# Patient Record
Sex: Female | Born: 1935
Health system: Southern US, Community
[De-identification: ages and names within clinical notes are randomized; demographics above are authoritative.]

## PROBLEM LIST (undated history)

## (undated) DIAGNOSIS — K5909 Other constipation: Secondary | ICD-10-CM

## (undated) DIAGNOSIS — J449 Chronic obstructive pulmonary disease, unspecified: Secondary | ICD-10-CM

## (undated) DIAGNOSIS — K224 Dyskinesia of esophagus: Secondary | ICD-10-CM

## (undated) DIAGNOSIS — K56609 Unspecified intestinal obstruction, unspecified as to partial versus complete obstruction: Secondary | ICD-10-CM

## (undated) DIAGNOSIS — H409 Unspecified glaucoma: Secondary | ICD-10-CM

## (undated) DIAGNOSIS — K9189 Other postprocedural complications and disorders of digestive system: Secondary | ICD-10-CM

## (undated) DIAGNOSIS — I10 Essential (primary) hypertension: Secondary | ICD-10-CM

## (undated) DIAGNOSIS — N83201 Unspecified ovarian cyst, right side: Secondary | ICD-10-CM

## (undated) DIAGNOSIS — N183 Chronic kidney disease, stage 3 (moderate): Secondary | ICD-10-CM

## (undated) DIAGNOSIS — N6009 Solitary cyst of unspecified breast: Secondary | ICD-10-CM

## (undated) DIAGNOSIS — N390 Urinary tract infection, site not specified: Secondary | ICD-10-CM

## (undated) DIAGNOSIS — IMO0002 Reserved for concepts with insufficient information to code with codable children: Secondary | ICD-10-CM

## (undated) DIAGNOSIS — K567 Ileus, unspecified: Secondary | ICD-10-CM

## (undated) DIAGNOSIS — E43 Unspecified severe protein-calorie malnutrition: Secondary | ICD-10-CM

## (undated) DIAGNOSIS — R9430 Abnormal result of cardiovascular function study, unspecified: Secondary | ICD-10-CM

## (undated) DIAGNOSIS — E46 Unspecified protein-calorie malnutrition: Secondary | ICD-10-CM

## (undated) DIAGNOSIS — N261 Atrophy of kidney (terminal): Secondary | ICD-10-CM

## (undated) HISTORY — DX: Unspecified intestinal obstruction, unspecified as to partial versus complete obstruction: K56.609

## (undated) HISTORY — DX: Abnormal result of cardiovascular function study, unspecified: R94.30

## (undated) HISTORY — DX: Reserved for concepts with insufficient information to code with codable children: IMO0002

---

## 1998-09-25 ENCOUNTER — Emergency Department (HOSPITAL_COMMUNITY): Admission: EM | Admit: 1998-09-25 | Discharge: 1998-09-25 | Payer: Self-pay | Admitting: Internal Medicine

## 1999-10-23 ENCOUNTER — Encounter: Payer: Self-pay | Admitting: Emergency Medicine

## 1999-10-23 ENCOUNTER — Emergency Department (HOSPITAL_COMMUNITY): Admission: EM | Admit: 1999-10-23 | Discharge: 1999-10-23 | Payer: Self-pay | Admitting: Emergency Medicine

## 1999-12-19 ENCOUNTER — Inpatient Hospital Stay (HOSPITAL_COMMUNITY): Admission: EM | Admit: 1999-12-19 | Discharge: 1999-12-23 | Payer: Self-pay | Admitting: Emergency Medicine

## 1999-12-21 ENCOUNTER — Encounter: Payer: Self-pay | Admitting: Cardiology

## 2000-02-06 ENCOUNTER — Emergency Department (HOSPITAL_COMMUNITY): Admission: EM | Admit: 2000-02-06 | Discharge: 2000-02-06 | Payer: Self-pay | Admitting: Emergency Medicine

## 2000-02-28 ENCOUNTER — Emergency Department (HOSPITAL_COMMUNITY): Admission: EM | Admit: 2000-02-28 | Discharge: 2000-02-29 | Payer: Self-pay | Admitting: Emergency Medicine

## 2000-02-29 ENCOUNTER — Encounter: Payer: Self-pay | Admitting: Emergency Medicine

## 2000-04-13 ENCOUNTER — Emergency Department (HOSPITAL_COMMUNITY): Admission: EM | Admit: 2000-04-13 | Discharge: 2000-04-14 | Payer: Self-pay

## 2000-05-06 ENCOUNTER — Encounter: Payer: Self-pay | Admitting: Family Medicine

## 2000-05-06 ENCOUNTER — Encounter: Admission: RE | Admit: 2000-05-06 | Discharge: 2000-05-06 | Payer: Self-pay | Admitting: Family Medicine

## 2002-01-08 ENCOUNTER — Inpatient Hospital Stay (HOSPITAL_COMMUNITY): Admission: EM | Admit: 2002-01-08 | Discharge: 2002-01-08 | Payer: Self-pay | Admitting: *Deleted

## 2002-02-05 ENCOUNTER — Emergency Department (HOSPITAL_COMMUNITY): Admission: EM | Admit: 2002-02-05 | Discharge: 2002-02-05 | Payer: Self-pay | Admitting: Emergency Medicine

## 2002-02-13 ENCOUNTER — Encounter: Payer: Self-pay | Admitting: *Deleted

## 2002-02-13 ENCOUNTER — Emergency Department (HOSPITAL_COMMUNITY): Admission: EM | Admit: 2002-02-13 | Discharge: 2002-02-13 | Payer: Self-pay | Admitting: *Deleted

## 2002-08-20 ENCOUNTER — Emergency Department (HOSPITAL_COMMUNITY): Admission: EM | Admit: 2002-08-20 | Discharge: 2002-08-21 | Payer: Self-pay | Admitting: Emergency Medicine

## 2002-08-20 ENCOUNTER — Encounter: Payer: Self-pay | Admitting: Emergency Medicine

## 2002-08-29 ENCOUNTER — Encounter: Payer: Self-pay | Admitting: Emergency Medicine

## 2002-08-29 ENCOUNTER — Emergency Department (HOSPITAL_COMMUNITY): Admission: EM | Admit: 2002-08-29 | Discharge: 2002-08-29 | Payer: Self-pay | Admitting: Emergency Medicine

## 2002-12-19 ENCOUNTER — Emergency Department (HOSPITAL_COMMUNITY): Admission: EM | Admit: 2002-12-19 | Discharge: 2002-12-20 | Payer: Self-pay | Admitting: Emergency Medicine

## 2003-12-29 ENCOUNTER — Emergency Department (HOSPITAL_COMMUNITY): Admission: EM | Admit: 2003-12-29 | Discharge: 2003-12-29 | Payer: Self-pay

## 2005-01-27 ENCOUNTER — Encounter: Payer: Self-pay | Admitting: Cardiology

## 2005-01-27 ENCOUNTER — Inpatient Hospital Stay (HOSPITAL_COMMUNITY): Admission: EM | Admit: 2005-01-27 | Discharge: 2005-01-28 | Payer: Self-pay | Admitting: Emergency Medicine

## 2006-01-29 ENCOUNTER — Emergency Department (HOSPITAL_COMMUNITY): Admission: EM | Admit: 2006-01-29 | Discharge: 2006-01-30 | Payer: Self-pay | Admitting: Emergency Medicine

## 2006-02-03 ENCOUNTER — Emergency Department (HOSPITAL_COMMUNITY): Admission: EM | Admit: 2006-02-03 | Discharge: 2006-02-03 | Payer: Self-pay | Admitting: Emergency Medicine

## 2007-06-23 DIAGNOSIS — N83201 Unspecified ovarian cyst, right side: Secondary | ICD-10-CM

## 2007-06-23 HISTORY — PX: BILATERAL OOPHORECTOMY: SHX1221

## 2007-06-23 HISTORY — DX: Unspecified ovarian cyst, right side: N83.201

## 2007-09-17 ENCOUNTER — Emergency Department (HOSPITAL_COMMUNITY): Admission: EM | Admit: 2007-09-17 | Discharge: 2007-09-17 | Payer: Self-pay | Admitting: Emergency Medicine

## 2007-11-05 ENCOUNTER — Inpatient Hospital Stay (HOSPITAL_COMMUNITY): Admission: EM | Admit: 2007-11-05 | Discharge: 2007-11-08 | Payer: Self-pay | Admitting: Emergency Medicine

## 2007-11-08 ENCOUNTER — Encounter (INDEPENDENT_AMBULATORY_CARE_PROVIDER_SITE_OTHER): Payer: Self-pay | Admitting: Internal Medicine

## 2007-11-10 ENCOUNTER — Encounter: Admission: RE | Admit: 2007-11-10 | Discharge: 2007-11-10 | Payer: Self-pay | Admitting: Internal Medicine

## 2007-12-06 ENCOUNTER — Ambulatory Visit: Admission: RE | Admit: 2007-12-06 | Discharge: 2007-12-06 | Payer: Self-pay | Admitting: Gynecologic Oncology

## 2008-01-17 ENCOUNTER — Encounter (HOSPITAL_COMMUNITY): Payer: Self-pay | Admitting: Obstetrics and Gynecology

## 2008-01-17 ENCOUNTER — Inpatient Hospital Stay (HOSPITAL_COMMUNITY): Admission: RE | Admit: 2008-01-17 | Discharge: 2008-01-20 | Payer: Self-pay | Admitting: Obstetrics and Gynecology

## 2009-11-16 ENCOUNTER — Ambulatory Visit (HOSPITAL_COMMUNITY): Admission: RE | Admit: 2009-11-16 | Discharge: 2009-11-16 | Payer: Self-pay | Admitting: Family Medicine

## 2010-02-18 ENCOUNTER — Emergency Department (HOSPITAL_COMMUNITY): Admission: EM | Admit: 2010-02-18 | Discharge: 2010-02-18 | Payer: Self-pay | Admitting: Emergency Medicine

## 2010-11-04 NOTE — Consult Note (Signed)
NAME:  Amber Rowland, Amber Rowland            ACCOUNT NO.:  [id number]   MEDICAL RECORD NO.:  [id number]          PATIENT TYPE:  OUT   LOCATION:  GYN                          FACILITY:  [facility]   PHYSICIAN:  Paola A. Duard Brady, MD    DATE OF BIRTH:  Aug 09, 1933   DATE OF CONSULTATION:  12/06/2007  DATE OF DISCHARGE:                                 CONSULTATION   HISTORY OF PRESENT ILLNESS:  The patient is seen today in consultation  at the request of Dr. Renaldo Fiddler.  This is a 77 year old gravida 2, para 2  who on May 16 was admitted to [facility] with what  was described as the flu or febrile illness, fever and weakness.  She  underwent a CT scan of the abdomen and pelvis that revealed a low  density lesion in the right adrenal gland which likely represented a  benign adenoma.  The gallbladder was mildly hydropic.  The liver was  negative.  There is no lymphadenopathy, small bowel mesenteric lymph  nodes.  There is a moderate amount of fecal burden throughout the colon  with no evidence of a bowel obstruction.  There was no abdominal or  pelvic free fluid within the pelvis, however.  There was also evidence  of chronic atrophy of the left kidney with a staghorn calculus in the  lower pole measuring 9.3 mm.  Within the pelvis there was a fluid  density structure in the left lower pelvis representing a bladder  diverticulum.  Within the right adnexal region, there was a  multiloculated fluid density measuring 4.8 x 3.1 cm.  There was no  pelvic lymphadenopathy.  There was a moderate to marked amount of  retained stool in the colon and significant degenerative disease.  The  patient's report is somewhat different. She states she presented to the  hospital because the hernia that she had for years she felt was  somewhat getting larger.  She was having some abdominal pain.  She  denies any abdominal pain now though she does complain of some right  lower quadrant aching.   REVIEW OF  SYSTEMS:  She denies any chest pain, short of breath, nausea,  vomiting, fevers, chills, headaches, visual changes.  She can go up a  flight of stairs without any difficulty.  She denies any significant  change in bowel or bladder habits.  She does often have constipation.  She did have some diarrhea after her CT scan.  She denies any blood in  her stool.  She states she has always been undernourished and  malnourished, denies any acute changes and her weight.  She denies early  satiety.   PAST MEDICAL HISTORY:  Hypertension, diabetes.   MEDICATIONS:  Altace questionable dose, Xanax and baby aspirin which was  discontinued at the time of her admission.   PAST SURGICAL HISTORY:  None.   ALLERGIES:  None.   SOCIAL HISTORY:  She smokes half a pack per day since the age of 57.  She denies the use of alcohol.   FAMILY HISTORY:  Noncontributory.   HEALTH MAINTENANCE:  She had a mammogram  2 weeks ago.  She has never had  any colon cancer screening.   GYN HISTORY:  She is as above, she is a gravida 2, para 2, status post  two NSVDs.  She recently had an ultrasound in Dr. Renaldo Fiddler office  revealing the right ovary to be 73 mL in volume with a 3.2 x 2.7 x 2.8  cm solid appearing mass, a 3.6 x 2.5 cm septated cyst and a 2.5 x 2.1-cm  simple appearing cyst with no free fluid. Her CA-125 was normal at 26.2.  Pap smear is negative.   PHYSICAL EXAMINATION:  VITAL SIGNS:  Height 5 feet, 1 inch, weight 96  pounds, blood pressure 176/86, pulse 58.  GENERAL:  A well-nourished, well-developed female in no acute distress.  NECK:  Supple.  There is no lymphadenopathy, no thyromegaly.  LUNGS:  Lungs were clear to auscultation bilaterally.  CARDIOVASCULAR EXAM:  Regular rate and rhythm.  ABDOMEN:  Abdomen is more consistent with a diastasis and in the midline  rather than a specific hernia.  The fascia appears to be intact.  The  diastasis is reducible, however, there appears to be fascia  underneath.  The abdomen is soft and nontender.  There is no fluid wave.  There is no  hepatosplenomegaly.  Groins are negative for adenopathy.  EXTREMITIES:  There is no edema.  PELVIC:  External genitalia is within normal limits though atrophic.  Bimanual examination:  The cervix is palpably normal.  The corpus of  normal size, shape, consistency.  I admit I cannot appreciate a right  adnexal mass.  Rectovaginal examination confirms there is a large amount  of hard stool within the colon.  There is no nodularity.   ASSESSMENT:  A 77 year old with a complex appearing adnexal mass on CT  scan with no evidence of adenopathy or fluid was normal CA-125.  On  ultrasound it appears that there is a solid area which could actually be  potentially the ovary with a simple appearing septated cyst.  I do  recommend surgery and I believe the patient could proceed with  laparoscopic bilateral salpingo-oophorectomy.  If Dr. Renaldo Fiddler wishes to  proceed with the surgery I think that would be reasonable while there is  a chance of malignancy, any time there is a mass in a postmenopausal  woman based on these characteristics I believe that to be a low risk.  Should it be a malignancy it can be done as a staged procedure.  Alternatively if Dr. Renaldo Fiddler wishes for [place] to perform the procedure will  be more than happy to work with her and assist her.  The patient was  seen and these issues were discussed with the patient and her daughter  and son-in-law.  Will arrange follow-up with Dr. Renaldo Fiddler.      Paola A. Duard Brady, MD  Electronically Signed     PAG/MEDQ  D:  12/06/2007  T:  12/06/2007  Job:  [id number] [id number]   cc:   Zelphia Cairo, MD  Fax: [phone]   Telford Nab, R.N.  [id number] [address]  [place], [place] [address]

## 2010-11-04 NOTE — H&P (Signed)
NAME:  Amber Rowland, Amber Rowland            ACCOUNT NO.:  [id number]   MEDICAL RECORD NO.:  [id number]          PATIENT TYPE:  INP   LOCATION:  0104                         FACILITY:  [facility]   PHYSICIAN:  Wilson Singer, M.D.DATE OF BIRTH:  Apr 25, 1934   DATE OF ADMISSION:  11/05/2007  DATE OF DISCHARGE:                              HISTORY & PHYSICAL   HISTORY:  This is a 77 year old lady who came to the emergency room  because, according to her, she was worried about breast lumps that she  found on her right breast.  However, on being reviewed in the emergency  room, she was found to have a fever of 102.3 rectally and had resting  tachycardia and was felt to have a respiratory illness.  She, from her  history, has COPD without requiring oxygen, but she gets short of breath  on minimal to moderate exertion in her own home.  She is not short of  breath at rest or bathing or clothing, but housework makes her short of  breath.  She has also noticed an approximately 20-pounds weight loss in  the last month or two and has a poor appetite.   PAST SURGICAL HISTORY:  None.   PAST MEDICAL HISTORY:  Hypertension for which she is apparently taking  Altace and Norvasc.  No history of cardiovascular or cerebrovascular  disease.   MEDICATIONS:  1. Altace, dose unclear.  2. Norvasc, dose unclear.  3. Nitrofurantoin 100 mg b.i.d.   ALLERGIES:  None.   REVIEW OF SYSTEMS:  Apart from the symptoms mentioned above, there are  no other symptoms referable to all systems reviewed.   PHYSICAL EXAMINATION:  VITAL SIGNS:  Temperature 102.3 rectal, blood  pressure 152/71, pulse 123 sinus rhythm, respiratory rate currently 12-  14, pulse oximetry 100%.  She looks cachectic.  CARDIOVASCULAR:  Heart sounds are present with a resting tachycardia.  RESPIRATORY:  There is no respiratory distress.  There is no peripheral  or central cyanosis.  There is no clubbing.  Lung fields are clear, and  there is poor air  entry in both lung fields.  ABDOMEN:  Soft, but there is a suggestion of hepatomegaly.  There is  what feels like a midline ventral hernia which is easily reducible.  BREASTS:  Examination of the breasts show mobile 2 lumps, approximately  1 cm in diameter that feel firm.  NEUROLOGICAL:  Alert and oriented without any focal neurological signs.   INVESTIGATIONS:  Chest x-ray shows changes consistent with COPD, but no  acute infiltrate or edema.  Hemoglobin 16.3, white blood cell count  17.3, platelets 143, lipase 19.  Sodium 138, potassium 4, bicarbonate  27, glucose 144, BUN 12, creatinine 1.10.  Liver function tests normal  with albumin normal at 3.8 and calcium normal at 9.9.   IMPRESSION:  1. Exacerbation of chronic obstructive pulmonary disease.  2. Right breast lumps, possibly fibroadenomas versus malignancy.  3. Possible hepatomegaly.  4. Cachexia with a 20-pound weight loss.  Her current weight is 87      pounds.  5. Hypertension.  6. Abnormal electrocardiogram with T-wave changes, ST-T wave  changes      laterally with T-wave inversions, and an incomplete right bundle-      branch block.   PLAN:  1. Admit.  2. Antibiotics.  3. CT of the abdomen to confirm the hepatomegaly and see if there is      any underlying malignancy here.  4. Consider surgical consultation for excision of the right breast      lumps for diagnostic purposes.  5. Will serial cardiac enzymes in view of abnormal electrocardiogram,      although I think this is more likely longstanding.   Further recommendations will depend on the patient's hospital progress.  I am concerned about her significant weight loss in the last few months.      Wilson Singer, M.D.  Electronically Signed     NCG/MEDQ  D:  11/05/2007  T:  11/05/2007  Job:  [id number]

## 2010-11-04 NOTE — Discharge Summary (Signed)
Amber Rowland, Amber Rowland            ACCOUNT NO.:  [id number]   MEDICAL RECORD NO.:  [id number]          PATIENT TYPE:  INP   LOCATION:  1416                         FACILITY:  [facility]   PHYSICIAN:  Hillery Aldo, M.D.   DATE OF BIRTH:  24-Dec-1933   DATE OF ADMISSION:  11/05/2007  DATE OF DISCHARGE:  11/08/2007                               DISCHARGE SUMMARY   PRIMARY CARE PHYSICIAN:  Dr. Barbaraann Barthel at [facility].   GYNECOLOGIST:  Dr. Renaldo Fiddler.   DISCHARGE DIAGNOSES:  1. Palpable breast masses, scheduled for ultrasound-guided biopsy on      Nov 10, 2007.  2. Adnexal mass, worrisome for fibrothecoma versus primary ovarian      malignancy with a right hydrosalpinx, follow up with Dr. Renaldo Fiddler      scheduled.  3. Abdominal distention, thought to be secondary to constipation.  4. Cachexia with weight loss.  5. Chronic atrophy of the left kidney.  6. Febrile illness.  7. Hypertension.  8. Glaucoma.  9. Chronic obstructive pulmonary disease.  10.Abnormal electrocardiogram (EKG):  Incomplete right bundle branch      block, biatrial enlargement, left axis deviation, pulmonary disease      pattern.   DISCHARGE MEDICATIONS:  1. Norvasc 5 mg daily.  2. Altace at previously recommended dose.  3. Xanax 0.25 mg q.6 h p.r.n. anxiety.  4. Azithromycin 250 mg x4 more days.   CONSULTATIONS:  Telephone consultation made with Dr. Renaldo Fiddler.  Telephone  consultation also made with Dr. Deboraha Sprang of Radiology.   BRIEF ADMISSION HISTORY OF PRESENT ILLNESS:  The patient is a 77-year-  old female who presented to the hospital for evaluation of a palpable  breast mass on the right.  She was incidentally noted to have a fever of  102.3 along with a resting tachycardia and there was some underlying  concern of respiratory illness and therefore [facility] was called for  admission purposes.  For the full details, please see the dictated  report done by Dr. Karilyn Cota.   PROCEDURES AND DIAGNOSTIC STUDIES:  1. Chest  X-Ray: On Nov 05, 2007 showed changes of COPD with no acute      infiltrates or edema.  2. CT scan of the abdomen and pelvis on Nov 05, 2007 showed no acute      upper abdominal CT findings.  Atelectasis versus scarring involving      the lingular portion of the left lung.  Chronic atrophy of the left      kidney.  There was a fluid density structure within the right      adnexal region.  Recommendations were to follow up with a pelvic      MRI or ultrasound.  Constipation was suspected as well.  3. MRI of the pelvis on Nov 06, 2007 showed a solid mass within the      right adnexa which had signal and enhancement characteristics      consistent with a fibrothecoma.  Surgical removal was recommended.      There was also right sided hydrosalpinx.  4. Two-dimensional echocardiogram done on Nov 08, 2007:  Results are  pending at this time.  5. Abnormal EKG:  Incomplete right bundle branch block, biatrial      enlargement, left axis deviation, pulmonary disease pattern.   DISCHARGE LABORATORY VALUES:  Sodium was 137, potassium 4.3, chloride  101, bicarb 29, BUN 15, creatinine 0.85, glucose 97.  White blood cell  count was 7.9, hemoglobin 13.6, hematocrit 40.9, platelets 143.   HOSPITAL COURSE:  1. Breast masses:  The patient has palpable breast masses bilaterally.      A follow-up mammogram with ultrasound-guided biopsy was scheduled      on Nov 10, 2007, which was the earliest appointment that could be      made.  Given her cachexia, weight loss, and palpable masses, this      is certainly worrisome for malignancy.  2. Adnexal mass consistent with possible fibrothecoma and right      hydrosalpinx:  The patient did have abdominal distention which      prompted a CT scan of her abdomen and pelvis.  The findings are as      noted above.  Telephone consultation was made with Dr. Renaldo Fiddler who      felt the patient could be evaluated as an outpatient.  Accordingly,      an appointment was set  up for the patient on Nov 17, 2007.  We will      defer any further malignancy workup to Dr. Renaldo Fiddler.  Accordingly, a      CA19/9 and a CA-125 level were not obtained while the patient was      hospitalized.  3. Abdominal distention:  The patient's abdominal distention is likely      due to constipation.  No other abnormalities of the liver or      findings suggestive of ascites were noted on the CT scan.  She did      have a moderate fecal burden throughout the colon and was put on a      bowel regimen to help evacuate her bowels.  4. Cachexia/weight loss:  The patient has had a 20-pound weight loss      over the past 6 months.  She has moderate protein calorie      malnutrition.  She was seen by the dietician who encouraged a      regular diet with chopped meats and Ensure Plus t.i.d.  5. Chronic atrophy of left kidney:  No active issues referable to      this.  6. Febrile illness:  The patient's initial presentation was not      concerning for any specific infectious etiology.  She was aware of      her fever and did complain of some chest pain, cough, and dyspnea      as well as fatigue.  Given this, she was empirically put on Avelox      and was transitioned over to a course of azithromycin.  The      patient's lung exam was benign.  She had no evidence of pneumonia      on chest radiography.  She had no further episodes of fever      throughout her hospital stay.  7. Hypertension:  The patient's blood pressure has remained well-      controlled throughout her hospital stay.   DISPOSITION:  The patient is medically stable for discharge and has  follow-up appointments with both the GYN and the radiologist for an  ultrasound-guided biopsy of her breast masses as well as a  mammography.  She is instructed to discontinue aspirin therapy prior to her planned  biopsy.  She should follow up with her primary care physician as  scheduled.      Hillery Aldo, M.D.  Electronically  Signed     CR/MEDQ  D:  11/08/2007  T:  11/08/2007  Job:  [id number]   cc:   Fanny Dance. Rankins, M.D.  Fax: [phone]   Zelphia Cairo, MD  Fax: [phone]

## 2010-11-04 NOTE — Op Note (Signed)
Amber Rowland, Amber Rowland            ACCOUNT NO.:  [id number]   MEDICAL RECORD NO.:  [id number]          PATIENT TYPE:  INP   LOCATION:  9306                          FACILITY:  [facility]   PHYSICIAN:  Zelphia Cairo, MD    DATE OF BIRTH:  1933/10/31   DATE OF PROCEDURE:  01/17/2008  DATE OF DISCHARGE:  01/20/2008                               OPERATIVE REPORT   PREOPERATIVE DIAGNOSIS:  Right ovarian mass.   POSTOPERATIVE DIAGNOSIS:  Right ovarian mass.   PROCEDURE:  diagnostic laparoscopy, laparotomy with bilateral salpingo-  oophorectomy.   SURGEON:  Zelphia Cairo, MD   ASSISTANT:  Juluis Mire, MD   ANESTHESIA:  General.   SPECIMEN:  Bilateral tubes and ovaries to pathology.   COMPLICATIONS:  None.   CONDITION:  Stable to recovery room.   PROCEDURE:  The patient was taken to the operating room where general  anesthesia was found to be adequate.  She was placed in the dorsal  lithotomy position using Allen stirrups.  She was prepped and draped in  sterile fashion and an in-and-out catheter was used to drain her  bladder.  Speculum was placed in the vagina and a Hulka clamp was placed  on the cervix to provide uterine manipulation.  Our attention was then  turned to the abdomen.  A small infraumbilical skin incision was made  with a scalpel and this was extended bluntly to the level of the fascia.  The fascia was grasped with 2 Kelly clamps, tented upwards and entered  sharply with a scalpel.  Trocar was then inserted bluntly under direct  visualization.  Once intraperitoneal placement was confirmed, CO2 was  turned on and the abdomen and pelvis were insufflated.  A small  suprapubic incision was then made with a scalpel and a 5-mm trocar was  inserted under direct visualization.  The patient was placed in  Trendelenburg position and a blunt probe was placed through the 5-mm  trocar.  The bowel was swept out of the cul-de-sac.  The patient was  noted to have a large right  ovarian mass, which was adhesed to the right  pelvic sidewall.  Left ovary appeared normal, but had some moderate-to-  dense adhesions to the uterus, left fallopian tube and left pelvic  sidewall.  Due to difficulty manipulating the colon out of the posterior  cul-de-sac and difficulty lysing adhesions of the ovaries, a decision  was made to perform a laparotomy.  CO2 was turned off.   Instruments and trocars were removed from the pelvis and a Pfannenstiel  skin incision was made.  This was carried down to the underlying fascia.  The fascia was incised in the midline and this was extended laterally  using Mayo scissors.  The fascia was tented upward with Kocher clamps,  and underlying rectus muscles were dissected off using the Bovie.  Peritoneum was then entered sharply and then superiorly and inferiorly  with good visualization of the bladder.  O'Connor-O'Sullivan retractor  was then placed into the abdomen.  The bowel was packed, cleared the  pelvis using moist lap sponges and the bladder blade was inserted  to  protect the bladder.  The uterus was then grasped with 2 Kelly clamps.  The right ovary was grasped with a Babcock and adhesions to the right  pelvic sidewall were lysed using Metzenbaum scissors.  The right ureter  was identified and the infundibular pelvic ligament was skeletonized.  This was doubly clamped with curved Heaney clamps, transected with  curved Mayo scissors and doubly ligated with 0 Vicryl suture.  Hemostasis was assured.  The utero-ovarian ligament was then clamped  with a curved Heaney, transected and suture ligated.  The right tube and  ovary were then passed off and sent to pathology and our attention was  turned to the left ovary.   The left ovary was grasped with a Babcock clamp.  Adhesions to the left  pelvic sidewall were lysed using Metzenbaum scissors.  The ureter was  identified.  The round ligament was clamped and transected.  The  ligament was then  suture ligated with #0 Vicryl.  The posterior leaf of  the broad ligament was then opened and the ureter was identified.  The  infundibular pelvic ligament was skeletonized and doubly clamped with  curved Heaneys, transected with Mayo scissors and double suture ligated  with #0 Vicryl.  Hemostasis was visualized.  The utero-ovarian ligament  was then clamped with a curved Heaney, transected with Mayo scissors and  the left ovary and tube were passed off and sent to pathology.  The left  utero-ovarian ligament was then suture ligated.  Hemostasis was assured.  The pelvis was then copiously irrigated with warm normal saline.  Once  hemostasis was assured bilaterally, retractors and sponges were removed  from the pelvis.  The peritoneum was then reapproximated with 0 Vicryl.  The fascia was closed with a 0 PDS and the skin was closed with staples.  Our attention was then turned to the infraumbilical incision.  The  fascia was reapproximated using a figure-of-eight stitch of Vicryl.  The  skin was closed with 3-0 Vicryl.  The Hulka clamp was then removed from  the cervix.  The patient was extubated and taken to the recovery room in  stable condition.      Zelphia Cairo, MD  Electronically Signed     GA/MEDQ  D:  01/25/2008  T:  01/26/2008  Job:  [id number]

## 2010-11-04 NOTE — H&P (Signed)
NAME:  Amber Rowland, Amber Rowland            ACCOUNT NO.:  [id number]   MEDICAL RECORD NO.:  [id number]           PATIENT TYPE:   LOCATION:                                 FACILITY:   PHYSICIAN:  Zelphia Cairo, MD    DATE OF BIRTH:  1933/12/13   DATE OF ADMISSION:  01/17/2008  DATE OF DISCHARGE:                              HISTORY & PHYSICAL   A 77 year old widowed G2, P2 white female who presents for surgical  management of bilateral ovarian masses.  Masses were noted incidentally  on CT scan during a prior hospitalization in the [place].  The patient  has intermittent lower quadrant pain, however denies persistent pain,  sharp pain, bowel or bladder complaints.  She denies any postmenopausal  bleeding.   PAST MEDICAL HISTORY:  Anxiety, COPD.   MEDICATIONS:  Include Xanax, aspirin and azithromycin.   ALLERGIES:  NONE.   SOCIAL HISTORY:  Significant for smoking half a pack per day for 50  years.  She denies any alcohol or drug use.   OB HISTORY:  Includes two vaginal deliveries without complications.   GYN HISTORY:  Negative for abnormal Pap smears.  Pap smear performed in  May 2009 was normal.   FAMILY HISTORY:  Noncontributory and negative for GYN, colon and breast  cancer.   PHYSICAL EXAM:  Height 4 foot 10, weight 88.5, blood pressure 122/80.  HEAD AND NECK:  Normal, no thyromegaly or nodularity.  HEART:  Regular rate and rhythm.  LUNGS:  Clear, no wheeze is noted.  ABDOMEN:  Soft. nontender and nondistended.  Good bowel sounds in all  four quadrants.  PELVIC:  Shows normal external female genitalia and urethral meatus.  Uterus is mobile and nontender, no adnexal tenderness is noted.  She  does have palpable 5 cm mobile nontender mass on the right.    Pelvic ultrasound was performed in the office, no left adnexal masses  were noted.  Right ovary shows a 3.2 x 2.7 x 2.8 cm solid mass, a 3.6 cm  septated cyst and a 2.5 x 2.1 cm simple appearing cyst.  No free fluid  or  other abnormalities were noted.  CA-125 was performed and was normal  at 26.  GYN and Oncology consult was done, laparoscopic oophorectomy was  recommended.   ASSESSMENT AND PLAN:  Right ovarian mass.  Our plan is for a  laparoscopic bilateral salpingo-oophorectomy.  Risks, benefits and  alternatives were discussed with Amber Rowland, all of her questions  were answered and informed consent was obtained.  Risks, benefits and  alternatives to surgical management were discussed with Amber Rowland and  informed consent was obtained.      Zelphia Cairo, MD  Electronically Signed     GA/MEDQ  D:  01/16/2008  T:  01/16/2008  Job:  [id number]

## 2010-11-07 NOTE — Discharge Summary (Signed)
NAME:  Amber Rowland, Amber Rowland            ACCOUNT NO.:  [id number]   MEDICAL RECORD NO.:  [id number]          PATIENT TYPE:  INP   LOCATION:  1407                         FACILITY:  [facility]   PHYSICIAN:  Mohan N. Sharyn Lull, M.D. DATE OF BIRTH:  1933/12/19   DATE OF ADMISSION:  01/26/2005  DATE OF DISCHARGE:  01/28/2005                                 DISCHARGE SUMMARY   ADMISSION DIAGNOSES:  1.  Chest pain, rule out myocardial infarction.  2.  Uncontrolled new onset hypertension.  3.  Tobacco abuse.  4.  Chronic obstructive pulmonary disease.  5.  Anxiety disorder.   FINAL DIAGNOSIS:  1.  Status post chest pain, myocardial infarction ruled out.  Negative      Persantine Myoview, musculoskeletal pain.  2.  New onset hypertension.  3.  Chronic obstructive pulmonary disease.  4.  Tobacco abuse.  5.  Urinary tract infection.  6.  Anxiety disorder.  7.  Status post hypokalemia.   DISCHARGE MEDICATIONS:  1.  Norvasc 5 mg one tablet daily.  2.  Enteric-coated aspirin 81 mg one tablet daily.  3.  Entex 2.5 mg one capsule daily.  4.  Xanax 0.25 mg one tablet twice daily.  5.  Cipro 250 mg one tablet twice daily for seven days.   ACTIVITY:  As tolerated.   DIET:  Low-cholesterol.   CONDITION ON DISCHARGE:  Stable.   FOLLOW UP:  With me in one week.   BRIEF HISTORY AND HOSPITAL COURSE:  Amber Rowland is a 77 year old white  female with past medical history significant for anxiety disorder, tobacco  abuse.  She came to the ER complaining of left-sided chest pain radiating to  the left arm off and on while mopping the floor for the last four days.  The  patient initially thought it was muscle strain and did not seek medical  attention, but as the pain persisted and today got worse, she decided to  come to the ER.  Denies any nausea or vomiting but complains of diaphoresis.  Denies shortness of breath.  Complains of palpitations but no  lightheadedness.  Denies PND, orthopnea or leg swelling.   Stated had left  cath in 2001 which was normal.  Denies relation of chest pain to movement,  coughing.  States occasionally chest pain radiates to the left arm.   PAST MEDICAL HISTORY:  As above and questionable silent myocardial  infarction by EKG.   PAST SURGICAL HISTORY:  None.   SOCIAL HISTORY:  She is widowed.  Has two children.  Smoked half a pack per  day for 50+ years.  No history of alcohol abuse.  She is retired housewife.   FAMILY HISTORY:  Noncontributory.   ALLERGIES:  No known drug allergies.   MEDICATIONS:  Questionable anxiety pills and aspirin.   PHYSICAL EXAMINATION:  GENERAL:  Alert and oriented x3, in no acute  distress.  VITAL SIGNS:  Blood pressure 190/100, pulse 78.  Conjunctivae is pink.  NECK:  Supple. No JVD, no bruit.  LUNGS:  Clear to auscultation bilaterally.  CARDIOVASCULAR:  S1 and S2 normal.  Soft S4 gallop.  ABDOMEN:  Soft.  Bowel sounds present which was mildly distended.  There was  very small ventral umbilical hernia. EXTREMITIES:  No clubbing, cyanosis or  edema.   LABORATORY DATA:  Hemoglobin 13, hematocrit 38.7, white count 6, platelets  163.  Labs today show hemoglobin 12.9, hematocrit 37.7, white count of 5.7.  Sodium 139, potassium 3.3 which has been replaced, glucose 86, BUN 6,  creatinine 1.  Liver enzymes were normal.  Two sets of cardiac enzymes were  normal.  Troponin-I was normal.  Cholesterol was 135, LDL 73, HDL 53.  Urinalysis showed many bacteria and 7-10 wbc's per high-power field.   BRIEF HISTORY AND HOSPITAL COURSE:  The patient was admitted to the  telemetry and myocardial infarction was ruled out by serial enzyme and EKG.  The patient subsequently underwent Persantine Myoview which showed no  evidence of reversible ischemia with the EF of 69%.  The patient did not  have any episodes of chest pain during the hospital stay.  The patient did  complain of frequency of urination.  The patient was started on p.o. Cipro  with  improvement in her symptoms.  The patient will be discharged on the  above medications and will be followed up in my office in one week.       MNH/MEDQ  D:  01/28/2005  T:  01/29/2005  Job:  [id number]

## 2010-11-07 NOTE — Procedures (Signed)
[place]  Patient:    Amber Rowland, Amber Rowland                   MRN: [id number] Proc. Date: 12/22/99 Adm. Date:  [id number] Attending:  Phifer, Harriett Sine [place] CC:         Ernesto Rutherford, M.D., [facility]             Arturo Morton. Riley Kill, M.D. [facility]             Lewayne Bunting, M.D.                           Procedure Report  PROCEDURE PERFORMED:  Left heart catheterization, coronary angiography and left ventriculography.  INDICATIONS:  Recurrent substernal chest pain.  I. INTRODUCTION:  The patient is a very pleasant 77 year old woman, who was admitted to the hospital complaining of chest tightness, which radiated to the back and shoulders.  It was associated with shortness of breath.  The patient noted that this chest tightness was similar to her exertional chest pain that she had had while mopping a floor.  She was subsequently admitted to the hospital and her cardiac enzymes demonstrated no myocardial infarction.  She continued to have intermittent chest pain however and for this reason, she was referred for evaluation with heart catheterization.  II. PROCEDURE:  After informed consent was obtained, the patient was taken to the diagnostic catheterization laboratory in the fasting state.  After usual preparation and draping, lidocaine was infiltrated into the right femoral region.  On the third attempt, the right femoral artery was cannulated and a Wholey wire was utilized to advance retrograde up the iliac artery and into the aorta.  A 6-French hemostatic sheath was placed in her right femoral artery and by way of this sheath, the left Judkins catheter was inserted and advanced over the [facility] wire into the left main coronary artery. Coronary angiography of the left main system was then carried out.  The left Judkins catheter was then removed and the right Judkins catheter was advanced again over the [facility] wire into the right coronary artery.   Coronary angiography of the right coronary system was then carried out.  The right Judkins catheter was removed and the pigtail catheter was inserted through the right femoral artery and advanced retrograde across the aortic valve into the left ventricle.  Left ventriculography in the RAO projection was then carried out. At this point, the catheters were removed, hemostasis assured and the patient returned to her room in good condition.  III. COMPLICATIONS:  There were no immediate procedural complications.  IV. RESULTS:  A. HEMODYNAMICS:  The left ventricular pressure was 144/14 and the aortic pressure was 143/65.  B. LEFT VENTRICULOGRAPHY:  Left ventriculography in the RAO projection with a total of 30 cc of contrast demonstrated a left ventricular ejection fraction of approximately 60%.  C. CORONARY ANGIOGRAPHY:  The right coronary artery was a dominant vessel.  It gave rise to the PDA and the posterolateral branches.  All of these were free of angiographic apparent coronary disease.  The left main coronary artery gave rise to the left anterior descending and left circumflex coronary artery. The left main coronary artery had no angiographic disease.  The left anterior descending gave rise to two diagonal branches.  The LAD and both diagonal branches were free of angiographic apparent obstructive coronary disease.  The left circumflex gave rise to two obtuse marginal branches,  followed by a terminal marginal branch.  Again, there was no evidence of any obstructive coronary disease present.  V. CONCLUSION: This study demonstrates normal left ventricular end-diastolic pressure with normal left ventricular function and normal coronary arteries free of obstructive coronary disease. DD:  12/22/99 TD:  12/22/99 Job: [id number]

## 2010-11-07 NOTE — Discharge Summary (Signed)
[place]  Patient:    Amber Rowland, Amber Rowland                   MRN: [id number] Adm. Date:  [id number] Disc. Date: [id number] Attending:  Alfonso Ramus Dictator:   Janyce Llanos, M.D. CC:         Ernesto Rutherford, M.D., at             [facility], [place] #[phone]                           Discharge Summary  DISCHARGE DIAGNOSES: 1. Atypical chest pain, not cardiac in nature. 2. Chronic obstructive pulmonary disease, tobacco abuse. 3. Mild elevation in thyroid stimulating hormone. 4. Glaucoma. 5. Bilateral lower extremity edema. 6. Recurrent urinary tract infection.  DISCHARGE MEDICATIONS: 1. Brimonidine 0.2% eyedrops two drops each eye b.i.d. 2. Betaxolol 0.25% eyedrops two drops each eye b.i.d. 3. Pepcid 20 mg p.o. b.i.d. 4. Colace 200 mg p.o. q.d. p.r.n. constipation. 5. Nortriptyline 25 mg p.o. q.h.s. 6. Os-Cal 500 mg p.o. t.i.d. with meals. 7. Lasix 20 mg p.o. q.d. 8. K-Dur 20 mEq p.o. q.d. 9. Nicotine patch 7 mg apply one q.d.  PROCEDURES: 1. Left heart catheterization with coronary angiography.  Left ventricle    pressure was found to be 144/14, aortic pressure 143/65, with the left    ventricular ejection fraction greater than 60%.  All the coronary arteries    were found to be normal with no significant blockages in any ______ left    ventricular end diastolic pressure was found.  Coronary catheterization was    performed on December 22, 1999. 2. Pulmonary function tests performed on December 23, 1999, which were consistent    with early obstructive disease with reserve volumes 131% of predicted.    However, FVC was 98% of predicted and FEV1 was 101% of predicted.  CONSULTS:  Cardiology was consulted on December 19, 1999, and planned the left heart catheterization.  However, after finding normal coronary arteries and left ventricular function, cardiology signed off on December 23, 1999.  HISTORY OF PRESENT ILLNESS:  Chief complaint is chest  tightness.  Amber Rowland is a 77 year old white female with a history of lower extremity edema, glaucoma, and recurrent UTI who presented to the emergency department on June 29 complaining of chest tightness and an ache across the upper chest to her shoulders and back which began at rest in the morning prior to admission and was accompanied by shortness of breath.  This pain was similar to some exertional chest pain she experienced the previous night after mopping the floor.  She had experienced less severe pain for the past two months.  The current chest pain persisted in the emergency department although it was less severe.  There were no relieving factors or aggravating factors; however, she reported it may have been worse with deep breath.  Patient had not had any prior cardiac workup other than EKGs.  She did have an echocardiogram planned in June 2001; however, she was unable to keep her appointment.  She does not have hypertension.  There is no family history of coronary artery disease or diabetes; however, she is a smoker and is unaware of her cholesterol status.  PAST MEDICAL HISTORY:  Significant for glaucoma, bilateral lower extremity edema, recurrent UTIs and umbilical hernia.  ALLERGIES:  She is not allergic to any medicines.  HOME MEDICATIONS: 1. K-Dur 20 mEq  p.o. q.d. 2. Lasix 20 mg p.o. q.d. 3. Alphagan drops two drops OU b.i.d. 4. Betoptic two drops OU b.i.d.  She does not take any over-the-counter drugs, herbs, or supplements.  SOCIAL HISTORY:  She lives alone in [place].  She is widowed.  She has never worked outside the home.  She is not close with any of her family.  She has smoked a half a pack of tobacco daily since the age of 46 years.  She denies any alcohol or illicit drug use.  FAMILY HISTORY:  Both parents are alive and well in their 84s.  Siblings:  She has six sisters and one brother who are all alive; however, she does not know any of their medical  history.  She has one daughter and one son who are both alive and doing well.  REVIEW OF SYSTEMS:  Positive only for some illness in her legs as well as some swelling in both of her legs; otherwise, review of systems is negative other than history of present illness.  PHYSICAL EXAMINATION:  VITAL SIGNS:  On admission, temperature 97.1, blood pressure 127/78, heart rate 88, respiratory rate 14, 98% saturated on room air.  GENERAL:  This is a thin, older than stated age appearing female in no acute distress.  She is awake and oriented x 4.  HEENT:  Pupils are equal, round, and reactive to light.  Extraocular motions are intact.  Throat is clear.  External auditory canals are clear.  NECK:  There is no lymphadenopathy or thyromegaly.  CARDIOVASCULAR:  There is no thrill or heave, regular rate and rhythm, no murmurs, rubs, or gallops.  No JVD.  There is trace pedal edema bilaterally. Pulses are weak in feet but symmetric.  RESPIRATORY:  Good air movements, symmetric, and clear to auscultation bilaterally.  There are no wheezes or crackles.  ABDOMEN:  Exam is soft, nontender, nondistended.  No masses.  Good bowel sounds.  EXTREMITIES:  She has stocking distribution of pink and atrophic skin suggestive of venous stasis, otherwise unremarkable.  NEUROLOGIC:  Cranial nerves 2-12 are intact.  She has diminished sensation in a stocking distribution.  Motor is intact bilaterally.  ADMISSION LABORATORIES:  CBC and basic metabolic panel are within normal limits.  LFTs are normal, total protein 6.7, albumin 3.6.  The original set of cardiac enzymes was negative with a CK of 129, MB fraction of 2.0, troponin I of less than 0.03.  UA had 0 to 5 white blood cells per high powered field, rare bacteria, and many epithelial cells; however, was nitrite and LE negative.  Portable chest x-ray revealed changes consistent with COPD including biapical and pleural scarring; however, there was no  infiltrate or edema and there was  no pneumothorax.  Mediastinum unremarkable.  EKG showed normal sinus rhythm with a rate of 62, normal intervals.  There was an incomplete right bundle branch block.  Axis was positive 90 degrees.  There were no significant Q waves.  There was no ST elevation.  There were some nonspecific T-wave changes, however unchanged from last EKG of May 2001.  HOSPITAL COURSE: #1 - ATYPICAL CHEST PAIN:  Patient was admitted with a diagnosis of unstable angina to the transitional care unit and started on low molecular weight heparin, aspirin, nitroglycerin sublingual as needed for chest pain, and metoprolol 12.5 mg p.o. b.i.d.  Cardiology consult was obtained.  MI was ruled out by serial enzymes as well as absence of EKG changes.  The patient quickly became pain-free and  was scheduled for cardiac catheterization on July 2.  As mentioned, catheterization was completely normal and cardiology signed off on the patient resolving that this chest pain was not cardiac in nature.  #2 - CHRONIC OBSTRUCTIVE PULMONARY DISEASE AND TOBACCO ABUSE:  Pulmonary function tests were obtained and revealed mild obstructive disease.  Patient was counseled on smoking cessation and agreed to try nicotine patches as well as nortriptyline at bedtime in effort to decrease craving.  #3 - MILD ELEVATION IN THYROID STIMULATING HORMONE:  On June 29, this was found to be 6.9.  Patient was instructed to follow up with primary doctor about this as it may be a cause for her lower extremity edema.  DISCHARGE LABORATORIES:  White count 5.2, hemoglobin 12.0, hematocrit 36.0, platelets 151, total cholesterol 143, triglycerides 54, HDL 60, and LDL 72. DD:  12/25/99 TD:  12/25/99 Job: [id number]

## 2010-11-07 NOTE — Consult Note (Signed)
. [facility]  Patient:    Amber Rowland, Amber Rowland                   MRN: [id number] Proc. Date: 12/19/99 Adm. Date:  [id number] Disc. Date: [id number] Attending:  Doug Sou Dictator:   Lewayne Bunting, M.D. CC:         Dr. Heidi Dach, M.D.                          Consultation Report  REFERRING PHYSICIANS: 1. Dr. Sharl Ma. 2. Dr. Luciana Axe.  REASON FOR REFERRAL:  Substernal chest pain.  HISTORY OF PRESENT ILLNESS:  Mrs. Benninger is a 77 year old white female with multiple risk factors for coronary artery disease who presents with sudden onset of substernal chest pain.  The patient had noted sudden onset of substernal chest pain this morning with radiation to the left shoulder and back.  The patient has had substernal chest pain for the last several months, but always had been on exertion.  She states that particularly she is mopping her floor she experiences these episodes.  This is also associated with shortness of breath and diaphoresis.  Had seen Dr. Luciana Axe at [facility] for lower extremity edema.  This was felt to be secondary to underlying heart disease.  The patient was scheduled for a cardiac workup last week, but was unable to make it due to the fact that she did not have a ride.  PAST MEDICAL HISTORY: 1. Glaucoma. 2. Leg edema. 3. Urinary tract infection. 4. History of umbilical hernia.  ALLERGIES:  No known drug allergies.  MEDICATIONS: 1. K-Dur 20 mEq p.o. q.d. 2. Lasix 20 mg p.o. q.d. 3. Alphagan two drops b.i.d. 4. Betoptic 2 drops b.i.d.  SOCIAL HISTORY:  The patient is widowed.  She lives in [place].  She continues to smoke.  She does not drink alcohol.  FAMILY HISTORY:  Positive for coronary artery disease.  REVIEW OF SYSTEMS:  Complaining of numbness and swelling in the lower extremities.  Edema.  No syncope or palpitations, orthopnea, or paroxysmal nocturnal dyspnea.  PHYSICAL EXAMINATION:  VITAL  SIGNS:  Blood pressure 130/78, heart rate is 68 to 88 beats per minute, respirations are 14 to 16 per minute, temperature is afebrile.  GENERAL:  Cachectic white female in no acute distress.  NECK:  No JVD, normal carotid upstrokes, no carotid bruits, neck is supple.  LUNGS:  Clear breath sounds bilaterally with no wheezing.  HEART:  Regular rate and rhythm, normal S1 and S2, no murmurs, rubs, or gallops.  ABDOMEN:  Soft, nontender, no rebound or guarding, good bowel sounds.  EXTREMITIES:  There is 2+ peripheral pulses.  Erythematous rash over the lower extremities, 1+ pitting edema, no cyanosis or clubbing.  NEUROLOGIC:  The patient is alert and oriented x 3, nonfocal.  LABORATORY DATA:  Essentially within normal limits.  Creatinine is mildly elevated at 1.2, potassium 3.8.  Initial set of CKs showed MBs and troponins are negative.  CBC is within normal limits.  Chest x-ray showed no infiltrates, no pulmonary vascular congestion, normal heart size.  EKG showed normal sinus rhythm with incomplete right bundle branch block with no acute ischemic changes.  IMPRESSION AND PLAN: 1. Substernal chest pain.  The patients pretest probability of coronary    artery disease is high.  At this point in time it is best to  rule out    significant underlying ischemic heart disease with a cardiac    catheterization.  Risks and benefits have been discussed with the patient,    and we will proceed with this procedure on Monday.  In the interim, she    needs to be treated aggressively with anti-thrombotic and antiplatelet    therapy.  Would use carefully beta blockers due to a history of asthma and    wheezing.  Alternative options may be to give a low dose calcium channel    blocker in addition to nitrates.  The patient has been placed on the    catheterization laboratory schedule for Monday. 2. Lower extremity edema.  Unclear if this is secondary to right-sided heart    failure, but could be  secondary to cor pulmonale physiology due to severe    underlying chronic obstructive pulmonary disease, however, a 12-lead    electrocardiogram is not suggestive of this.  DISPOSITION:  We will continue to follow the patient this weekend and we will proceed with cardiac catheterization on Monday. DD:  12/19/99 TD:  12/19/99 Job: [id number]

## 2010-11-07 NOTE — Discharge Summary (Signed)
Amber Rowland, Amber Rowland            ACCOUNT NO.:  [id number]   MEDICAL RECORD NO.:  [id number]          PATIENT TYPE:  INP   LOCATION:  9306                          FACILITY:  [facility]   PHYSICIAN:  Zelphia Cairo, MD    DATE OF BIRTH:  1934/01/26   DATE OF ADMISSION:  01/17/2008  DATE OF DISCHARGE:  01/20/2008                               DISCHARGE SUMMARY   Admit diagnosis:  right ovarian mass   Discharge diagnosis:  Right ovarian mass      The patient was admitted for surgical management of right ovarian mass.  Postoperatively, her pain was initially controlled with a PCA.  As she  was able to tolerate oral fluids and a regular diet, she was  transitioned from PCA to p.o. pain medication.  She was ambulating with  assistance and urinating after her Foley catheter was removed on  postoperative day 1.  By postoperative day #2, she was weaned from  oxygen supplementation.  She was still complaining of nausea and was not  having good p.o. intake so her IV fluids were continued.  She had good  bowel sounds and was ambulating with assistance.  .  She remained  afebrile throughout her hospitalization.  Postoperative hemoglobin was  stable at 11.7.  Her incisions were clean, dry, and intact.  Postoperative day #3, she was feeling much better.  Her nausea was  decreased and well controlled with oral antiemetics.  She was passing  flatus and having adequate urine output.  She was ambulating with  assistance and a physical therapy consult was ordered prior to  discharge.  She was tolerating a regular diet and her IV was  discontinued.  She was discharged home with prescriptions for Percocet,  Reglan, Altace, and Pepcid.  She was encouraged to follow up in our  office in 2 weeks for an incision exam.  Staples were removed prior to  discharge and she was discharged home in stable condition.  Social work  provided phone number for meals on wheels to the patient and her family.  She is to  follow up in our office for postoperative evaluation in 2  weeks.      Zelphia Cairo, MD  Electronically Signed     GA/MEDQ  D:  02/10/2008  T:  02/10/2008  Job:  [id number]

## 2011-03-16 LAB — URINALYSIS, ROUTINE W REFLEX MICROSCOPIC
Glucose, UA: NEGATIVE
pH: 8

## 2011-03-16 LAB — URINE MICROSCOPIC-ADD ON

## 2011-03-18 LAB — CULTURE, BLOOD (ROUTINE X 2): Culture: NO GROWTH

## 2011-03-18 LAB — BASIC METABOLIC PANEL
BUN: 15
Creatinine, Ser: 0.85
GFR calc non Af Amer: >60
Glucose, Bld: 97
Potassium: 4.3

## 2011-03-18 LAB — URINALYSIS, ROUTINE W REFLEX MICROSCOPIC
Ketones, ur: NEGATIVE
Nitrite: NEGATIVE
Protein, ur: NEGATIVE

## 2011-03-18 LAB — CBC
HCT: 40.9
MCV: 94.7
Platelets: 131 — ABNORMAL LOW
Platelets: 143 — ABNORMAL LOW
Platelets: 143 — ABNORMAL LOW
RBC: 4.41
RDW: 12.6
RDW: 13
WBC: 14.8 — ABNORMAL HIGH

## 2011-03-18 LAB — COMPREHENSIVE METABOLIC PANEL
Albumin: 3.8
Alkaline Phosphatase: 110
Alkaline Phosphatase: 78
BUN: 12
BUN: 15
Calcium: 9.9
Chloride: 107
Creatinine, Ser: 0.99
GFR calc non Af Amer: 55 — ABNORMAL LOW
Glucose, Bld: 131 — ABNORMAL HIGH
Potassium: 3.8
Potassium: 4
Sodium: 138
Total Bilirubin: 0.6
Total Protein: 7.5

## 2011-03-18 LAB — DIFFERENTIAL
Basophils Relative: 0
Eosinophils Relative: 1
Monocytes Absolute: 0 — ABNORMAL LOW
Monocytes Relative: 0 — ABNORMAL LOW
Neutrophils Relative %: 97 — ABNORMAL HIGH

## 2011-03-18 LAB — LIPID PANEL
Cholesterol: 193
LDL Cholesterol: 109 — ABNORMAL HIGH
Triglycerides: 39

## 2011-03-18 LAB — TROPONIN I
Troponin I: 0.02
Troponin I: 0.02

## 2011-03-18 LAB — SEDIMENTATION RATE: Sed Rate: 15

## 2011-03-18 LAB — CK TOTAL AND CKMB (NOT AT ARMC)
CK, MB: 2.7
Relative Index: INVALID
Total CK: 32

## 2011-03-18 LAB — TSH: TSH: 0.949

## 2011-03-20 LAB — COMPREHENSIVE METABOLIC PANEL
BUN: 10
Calcium: 9.4
Glucose, Bld: 82
Sodium: 138
Total Protein: 6.7

## 2011-03-20 LAB — DIFFERENTIAL
Lymphs Abs: 1.4
Monocytes Relative: 7
Neutro Abs: 3.8
Neutrophils Relative %: 64

## 2011-03-20 LAB — TYPE AND SCREEN: ABO/RH(D): O POS

## 2011-03-20 LAB — CBC
HCT: 39.8
Hemoglobin: 11.7 — ABNORMAL LOW
Hemoglobin: 13.1
MCHC: 33
RBC: 3.68 — ABNORMAL LOW
RDW: 13.5
WBC: 8.6

## 2011-03-20 LAB — APTT: aPTT: 32

## 2011-03-20 LAB — PROTIME-INR: INR: 1.1

## 2012-04-11 ENCOUNTER — Encounter (HOSPITAL_COMMUNITY): Payer: Self-pay | Admitting: Emergency Medicine

## 2012-04-11 ENCOUNTER — Inpatient Hospital Stay (HOSPITAL_COMMUNITY)
Admission: EM | Admit: 2012-04-11 | Discharge: 2012-05-10 | DRG: 336 | Disposition: A | Discharge status: Home / Self Care | Payer: Medicare Other | Attending: Surgery | Admitting: Surgery

## 2012-04-11 DIAGNOSIS — E46 Unspecified protein-calorie malnutrition: Secondary | ICD-10-CM | POA: Diagnosis present

## 2012-04-11 DIAGNOSIS — I10 Essential (primary) hypertension: Secondary | ICD-10-CM | POA: Diagnosis present

## 2012-04-11 DIAGNOSIS — J4489 Other specified chronic obstructive pulmonary disease: Secondary | ICD-10-CM | POA: Diagnosis not present

## 2012-04-11 DIAGNOSIS — K56609 Unspecified intestinal obstruction, unspecified as to partial versus complete obstruction: Secondary | ICD-10-CM | POA: Diagnosis present

## 2012-04-11 DIAGNOSIS — K929 Disease of digestive system, unspecified: Secondary | ICD-10-CM | POA: Diagnosis not present

## 2012-04-11 DIAGNOSIS — N390 Urinary tract infection, site not specified: Secondary | ICD-10-CM | POA: Diagnosis present

## 2012-04-11 DIAGNOSIS — Y838 Other surgical procedures as the cause of abnormal reaction of the patient, or of later complication, without mention of misadventure at the time of the procedure: Secondary | ICD-10-CM | POA: Diagnosis not present

## 2012-04-11 DIAGNOSIS — K5989 Other specified functional intestinal disorders: Secondary | ICD-10-CM | POA: Diagnosis not present

## 2012-04-11 DIAGNOSIS — K5909 Other constipation: Secondary | ICD-10-CM | POA: Diagnosis present

## 2012-04-11 DIAGNOSIS — F411 Generalized anxiety disorder: Secondary | ICD-10-CM | POA: Diagnosis present

## 2012-04-11 DIAGNOSIS — K560 Paralytic ileus: Secondary | ICD-10-CM | POA: Diagnosis not present

## 2012-04-11 DIAGNOSIS — F419 Anxiety disorder, unspecified: Secondary | ICD-10-CM

## 2012-04-11 DIAGNOSIS — J449 Chronic obstructive pulmonary disease, unspecified: Secondary | ICD-10-CM | POA: Diagnosis present

## 2012-04-11 DIAGNOSIS — K9189 Other postprocedural complications and disorders of digestive system: Secondary | ICD-10-CM

## 2012-04-11 DIAGNOSIS — D72829 Elevated white blood cell count, unspecified: Secondary | ICD-10-CM | POA: Diagnosis not present

## 2012-04-11 DIAGNOSIS — K565 Intestinal adhesions [bands], unspecified as to partial versus complete obstruction: Principal | ICD-10-CM | POA: Diagnosis present

## 2012-04-11 DIAGNOSIS — K567 Ileus, unspecified: Secondary | ICD-10-CM | POA: Diagnosis not present

## 2012-04-11 DIAGNOSIS — R918 Other nonspecific abnormal finding of lung field: Secondary | ICD-10-CM | POA: Diagnosis present

## 2012-04-11 DIAGNOSIS — Z681 Body mass index (BMI) 19 or less, adult: Secondary | ICD-10-CM

## 2012-04-11 DIAGNOSIS — R911 Solitary pulmonary nodule: Secondary | ICD-10-CM | POA: Diagnosis not present

## 2012-04-11 DIAGNOSIS — R112 Nausea with vomiting, unspecified: Secondary | ICD-10-CM | POA: Diagnosis present

## 2012-04-11 DIAGNOSIS — E871 Hypo-osmolality and hyponatremia: Secondary | ICD-10-CM

## 2012-04-11 DIAGNOSIS — N6009 Solitary cyst of unspecified breast: Secondary | ICD-10-CM | POA: Diagnosis present

## 2012-04-11 DIAGNOSIS — E876 Hypokalemia: Secondary | ICD-10-CM | POA: Diagnosis not present

## 2012-04-11 DIAGNOSIS — Z720 Tobacco use: Secondary | ICD-10-CM | POA: Diagnosis present

## 2012-04-11 DIAGNOSIS — F17200 Nicotine dependence, unspecified, uncomplicated: Secondary | ICD-10-CM | POA: Diagnosis present

## 2012-04-11 HISTORY — DX: Chronic obstructive pulmonary disease, unspecified: J44.9

## 2012-04-11 HISTORY — DX: Unspecified protein-calorie malnutrition: E46

## 2012-04-11 HISTORY — DX: Urinary tract infection, site not specified: N39.0

## 2012-04-11 HISTORY — DX: Solitary cyst of unspecified breast: N60.09

## 2012-04-11 HISTORY — DX: Unspecified ovarian cyst, right side: N83.201

## 2012-04-11 HISTORY — DX: Atrophy of kidney (terminal): N26.1

## 2012-04-11 HISTORY — DX: Other constipation: K59.09

## 2012-04-11 HISTORY — DX: Other postprocedural complications and disorders of digestive system: K91.89

## 2012-04-11 HISTORY — DX: Unspecified glaucoma: H40.9

## 2012-04-11 HISTORY — DX: Essential (primary) hypertension: I10

## 2012-04-11 HISTORY — DX: Ileus, unspecified: K56.7

## 2012-04-11 MED ORDER — SODIUM CHLORIDE 0.9 % IV SOLN
1000.0000 mL | INTRAVENOUS | Status: DC
  Administered 2012-04-11 – 2012-04-12 (×2): 1000 mL via INTRAVENOUS

## 2012-04-11 NOTE — ED Notes (Signed)
Pt sts she ate some tomato soup and began vomiting while eating it yesterday morning. N/V since yesterday morning- sts she has not been able to have bowel movement since 3 days ago. Stomach extremely distended due to hernia. Sts that she has not eaten anything since. Pt alert and oriented. Pt sts she has not taken anything to help alleviate the pain.

## 2012-04-11 NOTE — ED Notes (Signed)
Pt has an umbilical hernia  Pt states she was eating soup yesterday morning and started to have vomiting   Pt states she has not been able to hold anything down  Pt states she tried to take an exlax and has had no results but may have vomited it back up   Pt states she is having pain in her abd and her chest

## 2012-04-12 ENCOUNTER — Emergency Department (HOSPITAL_COMMUNITY): Payer: Medicare Other

## 2012-04-12 ENCOUNTER — Encounter (HOSPITAL_COMMUNITY): Payer: Self-pay | Admitting: Surgery

## 2012-04-12 DIAGNOSIS — R911 Solitary pulmonary nodule: Secondary | ICD-10-CM | POA: Diagnosis not present

## 2012-04-12 DIAGNOSIS — R918 Other nonspecific abnormal finding of lung field: Secondary | ICD-10-CM | POA: Diagnosis present

## 2012-04-12 DIAGNOSIS — Z720 Tobacco use: Secondary | ICD-10-CM | POA: Diagnosis present

## 2012-04-12 DIAGNOSIS — K56609 Unspecified intestinal obstruction, unspecified as to partial versus complete obstruction: Secondary | ICD-10-CM | POA: Diagnosis present

## 2012-04-12 DIAGNOSIS — K929 Disease of digestive system, unspecified: Secondary | ICD-10-CM | POA: Diagnosis not present

## 2012-04-12 DIAGNOSIS — N6009 Solitary cyst of unspecified breast: Secondary | ICD-10-CM

## 2012-04-12 DIAGNOSIS — R141 Gas pain: Secondary | ICD-10-CM

## 2012-04-12 DIAGNOSIS — R143 Flatulence: Secondary | ICD-10-CM

## 2012-04-12 DIAGNOSIS — I10 Essential (primary) hypertension: Secondary | ICD-10-CM

## 2012-04-12 DIAGNOSIS — E876 Hypokalemia: Secondary | ICD-10-CM | POA: Diagnosis not present

## 2012-04-12 DIAGNOSIS — F17200 Nicotine dependence, unspecified, uncomplicated: Secondary | ICD-10-CM | POA: Diagnosis present

## 2012-04-12 DIAGNOSIS — D72829 Elevated white blood cell count, unspecified: Secondary | ICD-10-CM | POA: Diagnosis not present

## 2012-04-12 DIAGNOSIS — K5909 Other constipation: Secondary | ICD-10-CM | POA: Diagnosis present

## 2012-04-12 DIAGNOSIS — N390 Urinary tract infection, site not specified: Secondary | ICD-10-CM | POA: Diagnosis not present

## 2012-04-12 DIAGNOSIS — Z681 Body mass index (BMI) 19 or less, adult: Secondary | ICD-10-CM | POA: Diagnosis not present

## 2012-04-12 DIAGNOSIS — R142 Eructation: Secondary | ICD-10-CM

## 2012-04-12 DIAGNOSIS — J449 Chronic obstructive pulmonary disease, unspecified: Secondary | ICD-10-CM | POA: Diagnosis present

## 2012-04-12 DIAGNOSIS — E871 Hypo-osmolality and hyponatremia: Secondary | ICD-10-CM | POA: Diagnosis not present

## 2012-04-12 DIAGNOSIS — K560 Paralytic ileus: Secondary | ICD-10-CM | POA: Diagnosis not present

## 2012-04-12 DIAGNOSIS — F411 Generalized anxiety disorder: Secondary | ICD-10-CM | POA: Diagnosis present

## 2012-04-12 DIAGNOSIS — N200 Calculus of kidney: Secondary | ICD-10-CM | POA: Diagnosis not present

## 2012-04-12 DIAGNOSIS — R112 Nausea with vomiting, unspecified: Secondary | ICD-10-CM | POA: Diagnosis present

## 2012-04-12 DIAGNOSIS — F419 Anxiety disorder, unspecified: Secondary | ICD-10-CM

## 2012-04-12 DIAGNOSIS — K565 Intestinal adhesions [bands], unspecified as to partial versus complete obstruction: Secondary | ICD-10-CM | POA: Diagnosis not present

## 2012-04-12 DIAGNOSIS — R109 Unspecified abdominal pain: Secondary | ICD-10-CM | POA: Diagnosis not present

## 2012-04-12 DIAGNOSIS — Z4682 Encounter for fitting and adjustment of non-vascular catheter: Secondary | ICD-10-CM | POA: Diagnosis not present

## 2012-04-12 DIAGNOSIS — IMO0002 Reserved for concepts with insufficient information to code with codable children: Secondary | ICD-10-CM | POA: Diagnosis not present

## 2012-04-12 DIAGNOSIS — E46 Unspecified protein-calorie malnutrition: Secondary | ICD-10-CM | POA: Diagnosis not present

## 2012-04-12 DIAGNOSIS — J4489 Other specified chronic obstructive pulmonary disease: Secondary | ICD-10-CM | POA: Diagnosis not present

## 2012-04-12 HISTORY — DX: Solitary cyst of unspecified breast: N60.09

## 2012-04-12 HISTORY — DX: Essential (primary) hypertension: I10

## 2012-04-12 LAB — URINALYSIS, ROUTINE W REFLEX MICROSCOPIC
Bilirubin Urine: NEGATIVE
Glucose, UA: NEGATIVE mg/dL
Hgb urine dipstick: NEGATIVE
Specific Gravity, Urine: 1.019 (ref 1.005–1.030)
Urobilinogen, UA: 1 mg/dL (ref 0.0–1.0)
pH: 7.5 (ref 5.0–8.0)

## 2012-04-12 LAB — BASIC METABOLIC PANEL
BUN: 14 mg/dL (ref 6–23)
GFR calc non Af Amer: 53 mL/min — ABNORMAL LOW (ref >90)
Glucose, Bld: 137 mg/dL — ABNORMAL HIGH (ref 70–99)
Potassium: 4.1 mEq/L (ref 3.5–5.1)

## 2012-04-12 LAB — CBC WITH DIFFERENTIAL/PLATELET
Eosinophils Absolute: 0 10*3/uL (ref 0.0–0.7)
Hemoglobin: 15.8 g/dL — ABNORMAL HIGH (ref 12.0–15.0)
Lymphs Abs: 0.9 10*3/uL (ref 0.7–4.0)
MCH: 30.7 pg (ref 26.0–34.0)
MCV: 94 fL (ref 78.0–100.0)
Monocytes Relative: 5 % (ref 3–12)
Neutrophils Relative %: 89 % — ABNORMAL HIGH (ref 43–77)
RBC: 5.14 MIL/uL — ABNORMAL HIGH (ref 3.87–5.11)

## 2012-04-12 MED ORDER — LIDOCAINE HCL 2 % EX GEL
CUTANEOUS | Status: AC
  Administered 2012-04-12: 03:00:00
  Filled 2012-04-12: qty 10

## 2012-04-12 MED ORDER — LACTATED RINGERS IV BOLUS (SEPSIS)
1000.0000 mL | Freq: Once | INTRAVENOUS | Status: AC
  Administered 2012-04-12: 1000 mL via INTRAVENOUS

## 2012-04-12 MED ORDER — LIP MEDEX EX OINT
1.0000 "application " | TOPICAL_OINTMENT | Freq: Two times a day (BID) | CUTANEOUS | Status: DC
  Administered 2012-04-12 – 2012-05-09 (×25): 1 via TOPICAL
  Filled 2012-04-12: qty 7

## 2012-04-12 MED ORDER — ONDANSETRON HCL 4 MG/2ML IJ SOLN
4.0000 mg | Freq: Four times a day (QID) | INTRAMUSCULAR | Status: DC | PRN
  Administered 2012-04-24 – 2012-04-28 (×3): 4 mg via INTRAVENOUS
  Filled 2012-04-12 (×3): qty 2

## 2012-04-12 MED ORDER — MAGIC MOUTHWASH
15.0000 mL | Freq: Four times a day (QID) | ORAL | Status: DC | PRN
  Filled 2012-04-12 (×2): qty 15

## 2012-04-12 MED ORDER — PROMETHAZINE HCL 25 MG/ML IJ SOLN
12.5000 mg | Freq: Four times a day (QID) | INTRAMUSCULAR | Status: DC | PRN
  Administered 2012-04-24: 12.5 mg via INTRAVENOUS
  Administered 2012-05-02: 25 mg via INTRAVENOUS
  Filled 2012-04-12 (×3): qty 1

## 2012-04-12 MED ORDER — MORPHINE SULFATE 2 MG/ML IJ SOLN
2.0000 mg | Freq: Once | INTRAMUSCULAR | Status: AC
  Administered 2012-04-12: 2 mg via INTRAVENOUS
  Filled 2012-04-12: qty 1

## 2012-04-12 MED ORDER — CEFTRIAXONE SODIUM 1 G IJ SOLR
1.0000 g | Freq: Once | INTRAMUSCULAR | Status: AC
  Administered 2012-04-12: 1 g via INTRAVENOUS
  Filled 2012-04-12: qty 10

## 2012-04-12 MED ORDER — ONDANSETRON HCL 4 MG/2ML IJ SOLN
4.0000 mg | Freq: Once | INTRAMUSCULAR | Status: AC
  Administered 2012-04-12: 4 mg via INTRAVENOUS
  Filled 2012-04-12: qty 2

## 2012-04-12 MED ORDER — DEXTROSE IN LACTATED RINGERS 5 % IV SOLN
INTRAVENOUS | Status: DC
  Administered 2012-04-12 – 2012-04-15 (×6): via INTRAVENOUS

## 2012-04-12 MED ORDER — MENTHOL 3 MG MT LOZG
1.0000 | LOZENGE | OROMUCOSAL | Status: DC | PRN
Start: 2012-04-12 — End: 2012-05-11
  Administered 2012-04-18: 3 mg via ORAL
  Filled 2012-04-12 (×4): qty 9

## 2012-04-12 MED ORDER — HEPARIN SODIUM (PORCINE) 5000 UNIT/ML IJ SOLN
5000.0000 [IU] | Freq: Three times a day (TID) | INTRAMUSCULAR | Status: DC
  Administered 2012-04-12 – 2012-04-22 (×32): 5000 [IU] via SUBCUTANEOUS
  Filled 2012-04-12 (×39): qty 1

## 2012-04-12 MED ORDER — DIPHENHYDRAMINE HCL 50 MG/ML IJ SOLN: 12.5000 mg | Freq: Four times a day (QID) | INTRAMUSCULAR | Status: DC | PRN

## 2012-04-12 MED ORDER — LACTATED RINGERS IV BOLUS (SEPSIS): 1000.0000 mL | Freq: Three times a day (TID) | INTRAVENOUS | Status: AC | PRN

## 2012-04-12 MED ORDER — INFLUENZA VIRUS VACC SPLIT PF IM SUSP
0.5000 mL | INTRAMUSCULAR | Status: AC
  Administered 2012-04-13: 0.5 mL via INTRAMUSCULAR
  Filled 2012-04-12: qty 0.5

## 2012-04-12 MED ORDER — IOHEXOL 300 MG/ML  SOLN
100.0000 mL | Freq: Once | INTRAMUSCULAR | Status: AC | PRN
  Administered 2012-04-12: 100 mL via INTRAVENOUS

## 2012-04-12 MED ORDER — BISACODYL 10 MG RE SUPP
10.0000 mg | Freq: Every day | RECTAL | Status: DC
  Administered 2012-04-12 – 2012-04-23 (×11): 10 mg via RECTAL
  Filled 2012-04-12 (×12): qty 1

## 2012-04-12 NOTE — Progress Notes (Signed)
INITIAL ADULT NUTRITION ASSESSMENT Date: 04/12/2012   Time: 2:17 PM Reason for Assessment: Nutrition risk   INTERVENTION: Diet advancement per MD. Will monitor.   Pt meets criteria for moderate malnutrition of chronic illness AEB <75% estimated energy intake for the past month in addition to pt with visible mild muscle and fat loss.   ASSESSMENT: Female 78 y.o.  Dx: SBO (small bowel obstruction)  Food/Nutrition Related Hx: Pt reports being unable to eat/drink anything for 3 days PTA r/t vomiting anything she tried to keep down. Pt states before then she would snack on foods and sandwiches throughout the day and drink a lot of coffee. Pt not on any nutritional supplements PTA. Pt denies any problems chewing/swallowing. Pt reports she has lost a lot of weight recently but is unsure how much. Pt with NGT in place with dark brown output. Pt denies any nausea or vomiting today. Pt with history of chronic constipation and reports no BM in 3-4 days PTA.   Hx:  Past Medical History  Diagnosis Date  . Glaucoma (increased eye pressure)   . Ovarian cyst, right 2009    s/p BSO 2009: OVARIAN FIBROTHECOMA in multiple fluid filled cysts  4.5cm region  . Atrophy of left kidney     Chronic atrophy of the left kidney.  . Chronic constipation   . COPD (chronic obstructive pulmonary disease)     Hyperinflation and heavy smoking history strongly  . HTN (hypertension) 04/12/2012  . Benign breast cysts in female 04/12/2012    Seen on mammography 2009   . UTI (lower urinary tract infection)     History of chronic recurrent UTIs   Related Meds:  Scheduled Meds:   . bisacodyl  10 mg Rectal Daily  . cefTRIAXone (ROCEPHIN)  IV  1 g Intravenous Once  . heparin  5,000 Units Subcutaneous Q8H  . influenza  inactive virus vaccine  0.5 mL Intramuscular Tomorrow-1000  . lactated ringers  1,000 mL Intravenous Once  . lidocaine      . lip balm  1 application Topical BID  .  morphine injection  2 mg Intravenous  Once  . ondansetron (ZOFRAN) IV  4 mg Intravenous Once   Continuous Infusions:   . dextrose 5% lactated ringers    . DISCONTD: sodium chloride 1,000 mL (04/12/12 0043)   PRN Meds:.diphenhydrAMINE, iohexol, lactated ringers, magic mouthwash, menthol-cetylpyridinium, ondansetron (ZOFRAN) IV, promethazine  Ht: 4\' 9"  (144.8 cm)  Wt: 85 lb (38.556 kg)  Ideal Wt: 95 lb % Ideal Wt: 89  Usual Wt: Pt unsure % Usual Wt: Pt unsure   Body mass index is 18.39 kg/(m^2).    Labs:  CMP     Component Value Date/Time   NA 139 04/12/2012 0020   K 4.1 04/12/2012 0020   CL 94* 04/12/2012 0020   CO2 32 04/12/2012 0020   GLUCOSE 137* 04/12/2012 0020   BUN 14 04/12/2012 0020   CREATININE 1.00 04/12/2012 0020   CALCIUM 9.8 04/12/2012 0020   PROT 6.7 01/16/2008 1237   ALBUMIN 3.6 01/16/2008 1237   AST 28 01/16/2008 1237   ALT 23 01/16/2008 1237   ALKPHOS 97 01/16/2008 1237   BILITOT 0.5 01/16/2008 1237   GFRNONAA 53* 04/12/2012 0020   GFRAA 62* 04/12/2012 0020    Intake/Output Summary (Last 24 hours) at 04/12/12 1423 Last data filed at 04/12/12 0728  Gross per 24 hour  Intake      1 ml  Output   1600 ml  Net  -1599  ml   Last BM - 10/19  Diet Order: NPO   IVF:    dextrose 5% lactated ringers   DISCONTD: sodium chloride Last Rate: 1,000 mL (04/12/12 0043)    Estimated Nutritional Needs:   Kcal:1300-1500 Protein:60-80g Fluid:1.3-1.5L  NUTRITION DIAGNOSIS: -Inadequate oral intake (NI-2.1).  Status: Ongoing  RELATED TO: inability to eat  AS EVIDENCE BY: NPO  MONITORING/EVALUATION(Goals): Advance diet as tolerated to regular diet.   EDUCATION NEEDS: -No education needs identified at this time  Dietitian #: [phone]  DOCUMENTATION CODES Per approved criteria  -Non-severe (moderate) malnutrition in the context of chronic illness -Underweight    Marshall Cork 04/12/2012, 2:17 PM

## 2012-04-12 NOTE — Evaluation (Signed)
Physical Therapy Evaluation Patient Details Name: Amber Rowland MRN: [id number] DOB: [birth year] Today's Date: 04/12/2012 Time: 0865-7846 PT Time Calculation (min): 21 min  PT Assessment / Plan / Recommendation Clinical Impression  78 y.o. female with SBO. Pt lives with son who works days. She was independent with mobility prior to admission. Today she ambulated 180' without assistive device, with supervision, no LOB. Expect pt can return to prior funtional level. Likely no DME nor HHPT needed. Pt would benefit from acute PT to maximize safety and independence with mobility.     PT Assessment  Patient needs continued PT services    Follow Up Recommendations  No PT follow up    Does the patient have the potential to tolerate intense rehabilitation      Barriers to Discharge        Equipment Recommendations  None recommended by PT    Recommendations for Other Services     Frequency Min 3X/week    Precautions / Restrictions Precautions Precautions: None Restrictions Weight Bearing Restrictions: No   Pertinent Vitals/Pain **0/10*      Mobility  Bed Mobility Bed Mobility: Sit to Supine Sit to Supine: 6: Modified independent (Device/Increase time) Transfers Transfers: Sit to Stand;Stand to Sit Sit to Stand: 5: Supervision;From bed Stand to Sit: 5: Supervision;To chair/3-in-1;With armrests;With upper extremity assist Ambulation/Gait Ambulation/Gait Assistance: 5: Supervision Ambulation Distance (Feet): 180 Feet Assistive device: None Gait Pattern: Within Functional Limits General Gait Details: no LOB, distance limited by fatigue    Shoulder Instructions     Exercises     PT Diagnosis: Generalized weakness  PT Problem List: Decreased activity tolerance PT Treatment Interventions: Gait training;Stair training;Functional mobility training;Patient/family education;Therapeutic exercise   PT Goals Acute Rehab PT Goals PT Goal Formulation: With patient Time For  Goal Achievement: 04/26/12 Potential to Achieve Goals: Good Pt will Ambulate: >150 feet;Independently PT Goal: Ambulate - Progress: Goal set today Pt will Go Up / Down Stairs: 3-5 stairs;with supervision;with rail(s) PT Goal: Up/Down Stairs - Progress: Goal set today Pt will Perform Home Exercise Program: Independently PT Goal: Perform Home Exercise Program - Progress: Goal set today  Visit Information  Last PT Received On: 04/12/12 Assistance Needed: +1    Subjective Data  Subjective: I'm not as strong as I thought I was.  Patient Stated Goal: return to living independently   Prior Functioning  Home Living Lives With: Son Available Help at Discharge: Family (son works days) Type of Home: House Home Access: Stairs to enter Secretary/administrator of Steps: 6 Entrance Stairs-Rails: Right Home Layout: One level Bathroom Shower/Tub: Teacher, adult education: Grab bars in shower Prior Function Level of Independence: Independent Able to Take Stairs?: Yes Driving: No Communication Communication: No difficulties    Cognition  Overall Cognitive Status: Appears within functional limits for tasks assessed/performed Arousal/Alertness: Awake/alert Orientation Level: Appears intact for tasks assessed Behavior During Session: [facility] for tasks performed    Extremity/Trunk Assessment Right Upper Extremity Assessment RUE ROM/Strength/Tone: [facility] for tasks assessed Left Upper Extremity Assessment LUE ROM/Strength/Tone: WFL for tasks assessed Right Lower Extremity Assessment RLE ROM/Strength/Tone: Within functional levels RLE Sensation: WFL - Light Touch RLE Coordination: WFL - gross/fine motor Left Lower Extremity Assessment LLE ROM/Strength/Tone: Within functional levels LLE Sensation: WFL - Light Touch LLE Coordination: WFL - gross/fine motor Trunk Assessment Trunk Assessment: Kyphotic   Balance Balance Balance Assessed: Yes Static Sitting Balance Static Sitting -  Balance Support: No upper extremity supported;Feet supported Static Sitting - Level of Assistance: 6: Modified  independent (Device/Increase time) Static Sitting - Comment/# of Minutes: 4  End of Session PT - End of Session Equipment Utilized During Treatment: Gait belt Activity Tolerance: Patient tolerated treatment well Patient left: in chair;with call bell/phone within reach Nurse Communication: Mobility status  GP     Ralene Bathe Kistler 04/12/2012, 12:20 PM [phone]

## 2012-04-12 NOTE — ED Provider Notes (Addendum)
History     CSN: [id number]  Arrival date & time 04/11/12  2220   First MD Initiated Contact with Patient 04/12/12 0008      Chief Complaint  Patient presents with  . Abdominal Pain  . Nausea  . Emesis    (Consider location/radiation/quality/duration/timing/severity/associated sxs/prior treatment) The history is provided by the patient.   78, old female, with a history of COPD, and umbilical hernia, presents to emergency department complaining of abdominal pain, and nausea.  She states she is unable to vomit. She has not had a bowel movement in the past 4 days.  She denies pain anywhere except in her abdomen.  She says that she has a burning sensation in her chest.  Level V caveat applies for urgent need for intervention Past Medical History  Diagnosis Date  . Hypertension   . Umbilical hernia   . Glaucoma (increased eye pressure)     Past Surgical History  Procedure Date  . Oophorectomy     History reviewed. No pertinent family history.  History  Substance Use Topics  . Smoking status: Current Every Day Smoker    Types: Cigarettes  . Smokeless tobacco: Not on file  . Alcohol Use: No    OB History    Grav Para Term Preterm Abortions TAB SAB Ect Mult Living                  Review of Systems  Constitutional: Negative for fever and chills.  Respiratory: Negative for cough and shortness of breath.   Cardiovascular: Negative for chest pain.  Gastrointestinal: Positive for nausea, abdominal pain and constipation. Negative for vomiting and diarrhea.  All other systems reviewed and are negative.    Allergies  Review of patient's allergies indicates no known allergies.  Home Medications   Current Outpatient Rx  Name Route Sig Dispense Refill  . ASPIRIN 325 MG PO TABS Oral Take 650 mg by mouth daily. Pain      BP 160/84  Pulse 103  Temp 98.5 F (36.9 C) (Oral)  Resp 21  SpO2 100%  Physical Exam  Nursing note and vitals reviewed. Constitutional: She  is oriented to person, place, and time. No distress.       Frail elderly, female, in a moderate amount of discomfort  HENT:  Head: Normocephalic and atraumatic.  Eyes: Conjunctivae normal are normal.  Neck: Normal range of motion. Neck supple.  Cardiovascular: Regular rhythm and intact distal pulses.   No murmur heard.      Tachycardia  Pulmonary/Chest: Effort normal and breath sounds normal. No respiratory distress. She has no rales.  Abdominal: Soft. She exhibits distension. There is tenderness. There is no rebound and no guarding.       Decreased bowel sounds, with significant swelling in the lower abdomen  Musculoskeletal: Normal range of motion. She exhibits no edema.  Neurological: She is alert and oriented to person, place, and time. No cranial nerve deficit.  Skin: Skin is warm and dry.  Psychiatric: She has a normal mood and affect. Thought content normal.    ED Course  Procedures (including critical care time) 78 year old, female, with history of umbilical hernia, presents emergency department with significant lower abdominal swelling, nausea, but no vomiting, and no bowel movement for the past 4 days.  I'm concerned she has a bowel obstruction.  We will establish an IV give analgesics, and perform an x-ray, to look for obstruction.  Labs Reviewed  CBC WITH DIFFERENTIAL - Abnormal; Notable for the  following:    WBC 18.4 (*)     RBC 5.14 (*)     Hemoglobin 15.8 (*)     HCT 48.3 (*)     Neutrophils Relative 89 (*)     Neutro Abs 16.4 (*)     Lymphocytes Relative 5 (*)     All other components within normal limits  BASIC METABOLIC PANEL - Abnormal; Notable for the following:    Chloride 94 (*)     Glucose, Bld 137 (*)     GFR calc non Af Amer 53 (*)     GFR calc Af Amer 62 (*)     All other components within normal limits  URINALYSIS, ROUTINE W REFLEX MICROSCOPIC - Abnormal; Notable for the following:    Color, Urine AMBER (*)  BIOCHEMICALS MAY BE AFFECTED BY COLOR    APPearance TURBID (*)     Protein, ur 30 (*)     Leukocytes, UA MODERATE (*)     All other components within normal limits  URINE MICROSCOPIC-ADD ON - Abnormal; Notable for the following:    Squamous Epithelial / LPF FEW (*)     Bacteria, UA MANY (*)     Casts HYALINE CASTS (*)     All other components within normal limits   Dg Abd Acute W/chest  04/12/2012  *RADIOLOGY REPORT*  Clinical Data: Nausea, vomiting and upper abdominal pain.  ACUTE ABDOMEN SERIES (ABDOMEN 2 VIEW & CHEST 1 VIEW)  Comparison: Abdominal CT 11/05/2007 and chest radiograph 01/16/2008  Findings: Chest radiograph again demonstrates hyperinflation. Stable appearance of the heart.  Chronic densities in the medial right lung base are poorly characterized on this exam due to overlying cardiac leads.  Similar densities were present on the prior examination.  There is a large amount of lucency in the left upper quadrant of the abdomen, suggesting a distended stomach.  There appears to be dilated loops of small bowel in the pelvis.  No evidence of free air on the left lateral decubitus study. There may be some mass effect on the bowel loops in the right lower quadrant.  IMPRESSION: There appears be marked distention of the stomach with small bowel dilatation.  Findings are concerning for a bowel obstruction.  Stable hyperinflation.  Again noted are chronic densities along the medial right lower lung which are poorly characterized on this examination due to overlying cardiac leads.   Original Report Authenticated By: Richarda Overlie, M.D.      No diagnosis found.  3:32 AM  Spoke with Dr. Michaell Cowing. He will come see her.    6:19 AM Dr. Michaell Cowing has seen ct. He wrote admission orders.  MDM  Small bowel obstruction, with leukocytosis        Cheri Guppy, MD 04/12/12 0300  Cheri Guppy, MD 04/12/12 0454  Cheri Guppy, MD 04/12/12 0981  Cheri Guppy, MD 05/11/12 [phone]

## 2012-04-12 NOTE — Progress Notes (Signed)
Utilization review completed.  

## 2012-04-12 NOTE — Consult Note (Addendum)
Amber Rowland  [birth year] [id number]   This patient is a 78 y.o.female who presents today for surgical evaluation at the request of Dr. Weldon Inches.   Reason for evaluation: Abdominal distention and obstipation suspicious for bowel obstruction.  Elderly female who tends to avoid doctors.  History of chronic constipation.  Has not had a bowel movement for at least 3-4 days.  Had worsening constipation.  Tried several large doses of laxatives.  Had cramping pain  & felt worse.  Claims she normally cannot vomit but started to throw up yesterday.  Denies any real nausea.  History of recurrent urinary tract infections in the past.  No urinary symptoms.  Lives at home.  Used to have a primary care physician through [facility], but I do not think she has any doctor taking care of her right now.  Is not on any medications except for aspirin and laxatives when necessary.  Because things worsen, she came to emergency room.  X-ray showed distended stomach.  Nasogastric tube placed.  There is some concern about an umbilical hernia - at least the patient thinks that she has one.  There is no record of any hernia repair.  Past Medical History  Diagnosis Date  . Hypertension   . Glaucoma (increased eye pressure)   . Ovarian cyst, right 2009    OVARIAN FIBROTHECOMA in multiple fluid filled cysts  4.5cm region  . Atrophy of left kidney     Chronic atrophy of the left kidney.  . Chronic constipation   . COPD (chronic obstructive pulmonary disease)     Hyperinflation and heavy smoking history strongly  . HTN (hypertension) 04/12/2012    Past Surgical History  Procedure Date  . Oophorectomy 2009    PROCEDURE:  diagnostic laparoscopy, laparotomy with bilateral salpingo-    History   Social History  . Marital Status: Widowed    Spouse Name: N/A    Number of Children: N/A  . Years of Education: N/A   Occupational History  . Not on file.   Social History Main Topics  . Smoking status: Current  Every Day Smoker    Types: Cigarettes  . Smokeless tobacco: Not on file  . Alcohol Use: No  . Drug Use: No  . Sexually Active:    Other Topics Concern  . Not on file   Social History Narrative  . No narrative on file    History reviewed. No pertinent family history.  Current Facility-Administered Medications  Medication Dose Route Frequency Provider Last Rate Last Dose  . 0.9 %  sodium chloride infusion  1,000 mL Intravenous Continuous Cheri Guppy, MD 125 mL/hr at 04/12/12 0043 1,000 mL at 04/12/12 0043  . bisacodyl (DULCOLAX) suppository 10 mg  10 mg Rectal Daily Ardeth Sportsman, MD      . diphenhydrAMINE (BENADRYL) injection 12.5-25 mg  12.5-25 mg Intravenous Q6H PRN Ardeth Sportsman, MD      . lactated ringers bolus 1,000 mL  1,000 mL Intravenous Once Ardeth Sportsman, MD      . lactated ringers bolus 1,000 mL  1,000 mL Intravenous Q8H PRN Ardeth Sportsman, MD      . lidocaine (XYLOCAINE) 2 % jelly           . lip balm (CARMEX) ointment 1 application  1 application Topical BID Ardeth Sportsman, MD      . magic mouthwash  15 mL Oral QID PRN Ardeth Sportsman, MD      . menthol-cetylpyridinium (CEPACOL)  lozenge 3 mg  1 lozenge Oral PRN Ardeth Sportsman, MD      . morphine 2 MG/ML injection 2 mg  2 mg Intravenous Once Cheri Guppy, MD   2 mg at 04/12/12 0042  . ondansetron (ZOFRAN) injection 4 mg  4 mg Intravenous Once Cheri Guppy, MD   4 mg at 04/12/12 0043  . ondansetron (ZOFRAN) injection 4-8 mg  4-8 mg Intravenous Q6H PRN Ardeth Sportsman, MD      . promethazine (PHENERGAN) injection 12.5-25 mg  12.5-25 mg Intravenous Q6H PRN Ardeth Sportsman, MD       Current Outpatient Prescriptions  Medication Sig Dispense Refill  . aspirin 325 MG tablet Take 650 mg by mouth daily. Pain         No Known Allergies  ROS: Constitutional:  No fevers, chills, sweats.  Weight low but stable Eyes:  No vision changes, No discharge HENT:  No sore throats, nasal drainage Lymph: No neck  swelling, No bruising easily Pulmonary:  No cough, productive sputum CV: No orthopnea, PND  Patient walks 15 minutes for about 1/4 miles without difficulty.  No exertional chest/neck/shoulder/arm pain. GI:  No personal nor family history of GI/colon cancer, inflammatory bowel disease, irritable bowel syndrome, allergy such as Celiac Sprue, dietary/dairy problems, colitis, ulcers nor gastritis.  No recent sick contacts/gastroenteritis.  No travel outside the country.  No changes in diet. I do not think she has ever had a colonoscopy  Renal: No UTIs, No hematuria Genital:  No drainage, bleeding, masses Musculoskeletal: No severe joint pain.  Good ROM major joints Skin:  No sores or lesions.  No rashes Heme/Lymph:  No easy bleeding.  No swollen lymph nodes Neuro: No focal weakness/numbness.  No seizures Psych: No suicidal ideation.  No hallucinations  BP 118/63  Pulse 106  Temp 98.5 F (36.9 C) (Oral)  Resp 19  SpO2 96%  Physical Exam: General: Pt awake/alert/oriented x4 in no major acute distress.  Very thin/cachectic Eyes: PERRL, normal EOM. Sclera nonicteric Neuro: CN II-XII intact w/o focal sensory/motor deficits. Lymph: No head/neck/groin lymphadenopathy Psych:  No delerium/psychosis/paranoia.  Perks up and can be somewhat talkative.  Memory recall poor HENT: Normocephalic, Mucus membranes moist.  No thrush.  Nasogastric tube in place.  700 mL dark brown material in canister Neck: Supple, No tracheal deviation Chest: No pain.  Good respiratory excursion. CV:  Pulses intact.  Regular rhythm Abdomen: Soft, Moderately distended.  Very thin abdominal wall vs diastasis recti.  Small bowel loops can be seen underneath.  Nontender.  Well-healed Pfannenstiel incision.  No umbilical or inguinal or incisional hernia Ext:  SCDs BLE.  No significant edema.  No cyanosis Skin: No petechiae / purpurea.  No major sores Musculoskeletal: No severe joint pain.  Good ROM major joints   Results:    Labs: Results for orders placed during the hospital encounter of 04/11/12 (from the past 48 hour(s))  URINALYSIS, ROUTINE W REFLEX MICROSCOPIC     Status: Abnormal   Collection Time   04/11/12 11:47 PM      Component Value Range Comment   Color, Urine AMBER (*) YELLOW BIOCHEMICALS MAY BE AFFECTED BY COLOR   APPearance TURBID (*) CLEAR    Specific Gravity, Urine 1.019  1.005 - 1.030    pH 7.5  5.0 - 8.0    Glucose, UA NEGATIVE  NEGATIVE mg/dL    Hgb urine dipstick NEGATIVE  NEGATIVE    Bilirubin Urine NEGATIVE  NEGATIVE    Ketones, ur  NEGATIVE  NEGATIVE mg/dL    Protein, ur 30 (*) NEGATIVE mg/dL    Urobilinogen, UA 1.0  0.0 - 1.0 mg/dL    Nitrite NEGATIVE  NEGATIVE    Leukocytes, UA MODERATE (*) NEGATIVE   URINE MICROSCOPIC-ADD ON     Status: Abnormal   Collection Time   04/11/12 11:47 PM      Component Value Range Comment   Squamous Epithelial / LPF FEW (*) RARE    WBC, UA 7-10  <3 WBC/hpf    Bacteria, UA MANY (*) RARE    Casts HYALINE CASTS (*) NEGATIVE   CBC WITH DIFFERENTIAL     Status: Abnormal   Collection Time   04/12/12 12:20 AM      Component Value Range Comment   WBC 18.4 (*) 4.0 - 10.5 K/uL    RBC 5.14 (*) 3.87 - 5.11 MIL/uL    Hemoglobin 15.8 (*) 12.0 - 15.0 g/dL    HCT 95.6 (*) 21.3 - 46.0 %    MCV 94.0  78.0 - 100.0 fL    MCH 30.7  26.0 - 34.0 pg    MCHC 32.7  30.0 - 36.0 g/dL    RDW 08.6  57.8 - 46.9 %    Platelets 210  150 - 400 K/uL    Neutrophils Relative 89 (*) 43 - 77 %    Neutro Abs 16.4 (*) 1.7 - 7.7 K/uL    Lymphocytes Relative 5 (*) 12 - 46 %    Lymphs Abs 0.9  0.7 - 4.0 K/uL    Monocytes Relative 5  3 - 12 %    Monocytes Absolute 1.0  0.1 - 1.0 K/uL    Eosinophils Relative 0  0 - 5 %    Eosinophils Absolute 0.0  0.0 - 0.7 K/uL    Basophils Relative 0  0 - 1 %    Basophils Absolute 0.0  0.0 - 0.1 K/uL   BASIC METABOLIC PANEL     Status: Abnormal   Collection Time   04/12/12 12:20 AM      Component Value Range Comment   Sodium 139  135 -  145 mEq/L    Potassium 4.1  3.5 - 5.1 mEq/L    Chloride 94 (*) 96 - 112 mEq/L    CO2 32  19 - 32 mEq/L    Glucose, Bld 137 (*) 70 - 99 mg/dL    BUN 14  6 - 23 mg/dL    Creatinine, Ser 6.29  0.50 - 1.10 mg/dL    Calcium 9.8  8.4 - 52.8 mg/dL    GFR calc non Af Amer 53 (*) >90 mL/min    GFR calc Af Amer 62 (*) >90 mL/min     Imaging / Studies: Dg Abd Acute W/chest  04/12/2012  *RADIOLOGY REPORT*  Clinical Data: Nausea, vomiting and upper abdominal pain.  ACUTE ABDOMEN SERIES (ABDOMEN 2 VIEW & CHEST 1 VIEW)  Comparison: Abdominal CT 11/05/2007 and chest radiograph 01/16/2008  Findings: Chest radiograph again demonstrates hyperinflation. Stable appearance of the heart.  Chronic densities in the medial right lung base are poorly characterized on this exam due to overlying cardiac leads.  Similar densities were present on the prior examination.  There is a large amount of lucency in the left upper quadrant of the abdomen, suggesting a distended stomach.  There appears to be dilated loops of small bowel in the pelvis.  No evidence of free air on the left lateral decubitus study. There may be some mass  effect on the bowel loops in the right lower quadrant.  IMPRESSION: There appears be marked distention of the stomach with small bowel dilatation.  Findings are concerning for a bowel obstruction.  Stable hyperinflation.  Again noted are chronic densities along the medial right lower lung which are poorly characterized on this examination due to overlying cardiac leads.   Original Report Authenticated By: Richarda Overlie, M.D.     Medications / Allergies: per chart  Antibiotics: Anti-infectives    None      Assessment  Amber Rowland  78 y.o. female       Problem List:  Principal Problem:  *Obstipation, ileus vs SBO Active Problems:  UTI (lower urinary tract infection)  Chronic constipation  COPD (chronic obstructive pulmonary disease)  Tobacco abuse  HTN (hypertension)  Benign breast  cysts in female  Anxiety  Lung nodules, ?incidental   Dilated abdomen suspicious for ileus in the setting of chronic constipation vs. bowel obstruction.  Will require hospital admission  No evidence of any hernia to my examination.  Probable medical noncompliance with avoiding of doctors.  Needs to reestablish primary care with health issues  Plan:  CT scan of chest abdomen and pelvis to help sort out what is going on.    If consistent with small bowel instruction, surgery can admit with medicine consultation to follow and help establish primary care  If CT scan were suspicious for ileus and constipation, treat urinary tract infection with antibiotics, enemas, & surgery can follow as needed with consultation  Include CT scan of chest for abnormal lung lesion seen on chest CT especially in the setting of COPD.  IVF resuscitation  IV antibiotics for dirty urinalysis strongly suspicious for urinary tract infection.  Ceftriaxone already ordered by Dr. Weldon Inches with emergency medicine  Nasogastric tube decompression until ileus/bowel obstruction resolved.  If not resolving in suspicious for bowel direction, she may require surgery to find out the source of her obstruction.  Most likely due to adhesions.  CT scan would be very helpful.  Tried to encourage tobacco cessation  -VTE prophylaxis- SCDs, etc -mobilize as tolerated to help recovery    Ardeth Sportsman, M.D., F.A.C.S. Gastrointestinal and Minimally Invasive Surgery [facility] [address], [address] [place], [place] [address] [phone] Main / Paging [phone] Voice Mail   04/12/2012  CARE TEAM:  PCP: No primary provider on file.  Outpatient Care Team: Patient has no care team.  Inpatient Treatment Team: Treatment Team: Attending Provider: Cheri Guppy, MD; Technician: Nicanor Alcon, NT; Consulting Physician: Bishop Limbo, MD; Registered Nurse: Guy Franco, RN; Respiratory  Therapist: Carney Harder, RRT

## 2012-04-13 ENCOUNTER — Inpatient Hospital Stay (HOSPITAL_COMMUNITY): Payer: Medicare Other

## 2012-04-13 LAB — CBC
HCT: 39.4 % (ref 36.0–46.0)
MCHC: 31.5 g/dL (ref 30.0–36.0)
MCV: 98.3 fL (ref 78.0–100.0)
RDW: 13.4 % (ref 11.5–15.5)
WBC: 9.4 10*3/uL (ref 4.0–10.5)

## 2012-04-13 LAB — BASIC METABOLIC PANEL
CO2: 38 mEq/L — ABNORMAL HIGH (ref 19–32)
Calcium: 8.9 mg/dL (ref 8.4–10.5)
GFR calc non Af Amer: 61 mL/min — ABNORMAL LOW (ref >90)
Glucose, Bld: 109 mg/dL — ABNORMAL HIGH (ref 70–99)
Potassium: 3.8 mEq/L (ref 3.5–5.1)
Sodium: 137 mEq/L (ref 135–145)

## 2012-04-13 MED ORDER — MORPHINE SULFATE 2 MG/ML IJ SOLN
1.0000 mg | INTRAMUSCULAR | Status: DC | PRN
  Administered 2012-04-13 – 2012-04-24 (×39): 1 mg via INTRAVENOUS
  Filled 2012-04-13 (×39): qty 1

## 2012-04-13 NOTE — Progress Notes (Signed)
Abdomen is soft and nontender Keep ng to suction and make npo

## 2012-04-13 NOTE — Progress Notes (Signed)
Patient ID: Amber Rowland, female   DOB: 1933-08-26, 78 y.o.   MRN: [id number]    Subjective: Pt has been up walking with her NGT clamped.  She has tolerated this so far.  She denies n/v.  Abd is sore but no severe pain, denies bm/flatus.  Reports "burping" a lot.  Objective: Vital signs in last 24 hours: Temp:  [98.5 F (36.9 C)-99.4 F (37.4 C)] 98.5 F (36.9 C) (10/23 0429) Pulse Rate:  [64-96] 64  (10/23 0429) Resp:  [14] 14  (10/23 0429) BP: (123-158)/(70-79) 158/70 mmHg (10/23 0429) SpO2:  [90 %-98 %] 98 % (10/23 0429) Last BM Date: 04/12/12  Intake/Output from previous day: 10/22 0701 - 10/23 0700 In: 2061 [P.O.:1; I.V.:2000; NG/GT:60] Out: 2125 [Urine:1050; Emesis/NG output:775] Intake/Output this shift: Total I/O In: -  Out: 200 [Urine:200]  PE: Abd: distended, tender to palp but only mildly, no bowel sounds  Lab Results:   Basename 04/13/12 0433 04/12/12 0020  WBC 9.4 18.4*  HGB 12.4 15.8*  HCT 39.4 48.3*  PLT 148* 210   BMET  Basename 04/13/12 0433 04/12/12 0020  NA 137 139  K 3.8 4.1  CL 98 94*  CO2 38* 32  GLUCOSE 109* 137*  BUN 11 14  CREATININE 0.89 1.00  CALCIUM 8.9 9.8   PT/INR No results found for this basename: LABPROT:2,INR:2 in the last 72 hours CMP     Component Value Date/Time   NA 137 04/13/2012 0433   K 3.8 04/13/2012 0433   CL 98 04/13/2012 0433   CO2 38* 04/13/2012 0433   GLUCOSE 109* 04/13/2012 0433   BUN 11 04/13/2012 0433   CREATININE 0.89 04/13/2012 0433   CALCIUM 8.9 04/13/2012 0433   PROT 6.7 01/16/2008 1237   ALBUMIN 3.6 01/16/2008 1237   AST 28 01/16/2008 1237   ALT 23 01/16/2008 1237   ALKPHOS 97 01/16/2008 1237   BILITOT 0.5 01/16/2008 1237   GFRNONAA 61* 04/13/2012 0433   GFRAA 71* 04/13/2012 0433   Lipase     Component Value Date/Time   LIPASE 19 11/05/2007 1455       Studies/Results: Ct Chest W Contrast  04/12/2012  *RADIOLOGY REPORT*  Clinical Data: Nodule on chest x-ray  CT CHEST WITH CONTRAST   Technique:  Multidetector CT imaging of the chest was performed following the standard protocol during bolus administration of intravenous contrast.  Contrast: OMNIPAQUE IOHEXOL 300 MG/ML  SOLN  Comparison: 11/05/2007 abdominal CT  Findings: Right apical consolidation along the posterior periphery. 4 mm right middle lobe nodule.  Mild bibasilar scarring. Nodular opacity left lower lobe on series 4 image 42 appears linear on the reformats, therefore favored to reflect scarring.  There is increased AP diameter.  NG tube descends into the stomach.  Pectus excavatum.  Mild cardiomegaly.  Trace pericardial fluid.  No pleural effusions.  No intrathoracic lymphadenopathy. Central airways are patent.  No pneumothorax.  Thoracolumbar spine curvature.  No acute osseous finding.  See separate abdominal CT report.  IMPRESSION: Posterior right apical consolidation is favored to reflect scarring.  Pneumonia not excluded.  Recommend short-term follow-up to document resolution or stability.  4 mm right middle lobe nodule. If the patient is at high risk for bronchogenic carcinoma, follow-up chest CT at 1 year is recommended.  If the patient is at low risk, no follow-up is needed.  This recommendation follows the consensus statement: Guidelines for Management of Small Pulmonary Nodules Detected on CT Scans:  A Statement from the Fleischner Society  as published in Radiology 2005; [id number].   Original Report Authenticated By: Waneta Martins, M.D.    Ct Abdomen Pelvis W Contrast  04/12/2012  *RADIOLOGY REPORT*  Clinical Data: Abdominal pain  CT ABDOMEN AND PELVIS WITH CONTRAST  Technique:  Multidetector CT imaging of the abdomen and pelvis was performed following the standard protocol during bolus administration of intravenous contrast.  Contrast: OMNIPAQUE IOHEXOL 300 MG/ML  SOLN  Comparison: 11/05/2007  Findings: See separate chest CT report.  Unremarkable liver, biliary system, spleen, pancreas, adrenal glands.   Atrophic left kidney containing a large calcification.  Hypodensity arising from the lower pole right kidney is favored to be a cyst.  Otherwise, homogeneous enhancement.  No hydronephrosis.  Unable to follow ureteral course.  Large bowel is relatively decompressed as are distal small bowel loops.  Proximal small bowel loops are dilated up to 3.5 cm with air-fluid levels.  There is free intraperitoneal fluid.  No free intraperitoneal air.  Minimal intraperitoneal fat limits evaluation for adenopathy.  Thin-walled bladder.  Nonspecific appearance to the uterus.  Scattered atherosclerosis of the aorta and branch vessels.  No aneurysmal dilatation.  Multilevel degenerative changes.  Curvature of the spine.  No acute osseous finding.  IMPRESSION: Small bowel obstruction.  Difficult to localize exact transition point however distal small bowel loops are entirely decompressed.  There is a small amount ascites which is nonspecific and may be reactive to the obstructive process.   Original Report Authenticated By: Waneta Martins, M.D.    Dg Abd 2 Views  04/13/2012  *RADIOLOGY REPORT*  Clinical Data: Abdominal pain, follow up SBO  ABDOMEN - 2 VIEW  Comparison: CT abdomen pelvis dated 04/12/2012  Findings: Enteric tube likely terminates in the stomach.  Paucity of bowel gas.  When correlating with prior CT, this likely reflects multiple fluid filled loops of small bowel, which limits the sensitivity of radiographs for detecting small bowel obstruction.  No evidence of free air on the lateral decubitus view.  Degenerative changes of the visualized thoracolumbar spine.  IMPRESSION: Paucity of bowel gas, likely corresponding to multiple fluid-filled loops of small bowel on the prior CT, limiting the sensitivity of radiographs for detecting small bowel obstruction.  No free air.   Original Report Authenticated By: Charline Bills, M.D.    Dg Abd Acute W/chest  04/12/2012  *RADIOLOGY REPORT*  Clinical Data: Nausea,  vomiting and upper abdominal pain.  ACUTE ABDOMEN SERIES (ABDOMEN 2 VIEW & CHEST 1 VIEW)  Comparison: Abdominal CT 11/05/2007 and chest radiograph 01/16/2008  Findings: Chest radiograph again demonstrates hyperinflation. Stable appearance of the heart.  Chronic densities in the medial right lung base are poorly characterized on this exam due to overlying cardiac leads.  Similar densities were present on the prior examination.  There is a large amount of lucency in the left upper quadrant of the abdomen, suggesting a distended stomach.  There appears to be dilated loops of small bowel in the pelvis.  No evidence of free air on the left lateral decubitus study. There may be some mass effect on the bowel loops in the right lower quadrant.  IMPRESSION: There appears be marked distention of the stomach with small bowel dilatation.  Findings are concerning for a bowel obstruction.  Stable hyperinflation.  Again noted are chronic densities along the medial right lower lung which are poorly characterized on this examination due to overlying cardiac leads.   Original Report Authenticated By: Richarda Overlie, M.D.     Anti-infectives: Anti-infectives  Start     Dose/Rate Route Frequency Ordered Stop   04/12/12 0445   cefTRIAXone (ROCEPHIN) 1 g in dextrose 5 % 50 mL IVPB        1 g 100 mL/hr over 30 Minutes Intravenous  Once 04/12/12 0435 04/12/12 0615           Assessment/Plan  1.  SBO: no change in films today, no n/v thus far with NGT clamped, will try clamping trial until 1400, then allow some clears.  If tolerates this then can d/c ngt.  If not then patient will need to start considering surgical intervention.  Dr. Carolynne Edouard will be by later to speak with patient.  Patient more open to an operation if needed now.  WBC normalized now  --Clamp NGT until 1400  --Give sips of clears after 1400  --unclamp NGT for n/v at anytime  --repeat films in AM  --follow labs  LOS: 2 days    Mildred Tuccillo,  [facility] 04/13/2012

## 2012-04-13 NOTE — Evaluation (Deleted)
Occupational Therapy Evaluation Patient Details Name: Amber Rowland MRN: [id number] DOB: 01-24-1934 Today's Date: 04/13/2012 Time: 4098-1191 OT Time Calculation (min): 18 min  OT Assessment / Plan / Recommendation Clinical Impression  Pt admitted with SBO. Pt is at a supervision level for all functional ADL tasks and feel she will likely be able to return to PLOF. Skilled OT indicated to maximize independence with BADLs to independent/mod I level in prep for safe d/c home.    OT Assessment  Patient needs continued OT Services    Follow Up Recommendations       Barriers to Discharge      Equipment Recommendations  None recommended by PT    Recommendations for Other Services    Frequency  Min 2X/week    Precautions / Restrictions Precautions Precautions: Fall Precaution Comments: NPO, NG tube, COPD Restrictions Weight Bearing Restrictions: No   Pertinent Vitals/Pain Reported 5/10 upper abdominal pain. Repositioned in bed.    ADL  Grooming: Simulated;Supervision/safety Where Assessed - Grooming: Unsupported standing Upper Body Bathing: Simulated;Supervision/safety Where Assessed - Upper Body Bathing: Unsupported standing Lower Body Bathing: Simulated;Supervision/safety Where Assessed - Lower Body Bathing: Unsupported sit to stand Upper Body Dressing: Simulated;Supervision/safety Where Assessed - Upper Body Dressing: Unsupported standing Lower Body Dressing: Simulated;Supervision/safety Where Assessed - Lower Body Dressing: Unsupported sit to stand Toilet Transfer: Performed;Supervision/safety Toilet Transfer Method: Sit to Barista: Comfort height toilet Toileting - Clothing Manipulation and Hygiene: Performed;Supervision/safety Where Assessed - Engineer, mining and Hygiene: Sit to stand from 3-in-1 or toilet Equipment Used: Rolling walker Transfers/Ambulation Related to ADLs: Pt ambulated to the bathroom pushing IV pole with  supervision. ADL Comments: Pt's NG tube was clamped upon OT's arrival. Pt stated she tolerated some sips of coffee without nausea.    OT Diagnosis: Generalized weakness  OT Problem List: Decreased activity tolerance;Decreased knowledge of use of DME or AE;Pain OT Treatment Interventions: Self-care/ADL training;Therapeutic activities;DME and/or AE instruction;Patient/family education   OT Goals Acute Rehab OT Goals OT Goal Formulation: With patient Time For Goal Achievement: 04/27/12 Potential to Achieve Goals: Good ADL Goals Pt Will Perform Grooming: Independently;Standing at sink ADL Goal: Grooming - Progress: Goal set today Pt Will Transfer to Toilet: with modified independence;Comfort height toilet;Ambulation;Regular height toilet ADL Goal: Toilet Transfer - Progress: Goal set today Pt Will Perform Toileting - Clothing Manipulation: Independently;Sitting on 3-in-1 or toilet;Standing ADL Goal: Toileting - Clothing Manipulation - Progress: Goal set today Pt Will Perform Toileting - Hygiene: Independently;Sit to stand from 3-in-1/toilet ADL Goal: Toileting - Hygiene - Progress: Goal set today Pt Will Perform Tub/Shower Transfer: Tub transfer;with modified independence;Ambulation;Grab bars ADL Goal: Tub/Shower Transfer - Progress: Goal set today Additional ADL Goal #1: Pt will complete all aspects of bathing and dressing including item retrieval at a mod I level. ADL Goal: Additional Goal #1 - Progress: Goal set today  Visit Information  Last OT Received On: 04/13/12 Assistance Needed: +1    Subjective Data  Subjective: I don't want to take my socks off. I don't want you to see my toenails. Patient Stated Goal: To not have to have sx.   Prior Functioning     Home Living Lives With: Family Available Help at Discharge: Family (if pt has to have sx she will be staying with her daughter) Type of Home: Apartment Home Access: Stairs to enter Entrance Stairs-Number of Steps: 6 in  back, 3 in front. Entrance Stairs-Rails: Right Home Layout: One level Bathroom Shower/Tub: Engineer, manufacturing systems: Handicapped height Home  Adaptive Equipment: Grab bars in shower Prior Function Level of Independence: Independent Able to Take Stairs?: Yes Driving: No Vocation: Retired Musician: No difficulties Dominant Hand: Right         Vision/Perception     Cognition  Overall Cognitive Status: Appears within functional limits for tasks assessed/performed Arousal/Alertness: Awake/alert Orientation Level: Appears intact for tasks assessed Behavior During Session: [facility] for tasks performed    Extremity/Trunk Assessment Right Upper Extremity Assessment RUE ROM/Strength/Tone: [facility] for tasks assessed Left Upper Extremity Assessment LUE ROM/Strength/Tone: WFL for tasks assessed     Mobility Bed Mobility Bed Mobility: Supine to Sit Supine to Sit: 6: Modified independent (Device/Increase time) Sit to Supine: 6: Modified independent (Device/Increase time) Details for Bed Mobility Assistance: increased time Transfers Sit to Stand: 5: Supervision;With upper extremity assist;From bed;From toilet Stand to Sit: 5: Supervision;With upper extremity assist;To bed;To toilet Details for Transfer Assistance: Pt demo'd good safety awareness throughout.     Shoulder Instructions     Exercise     Balance Static Sitting Balance Static Sitting - Balance Support: No upper extremity supported;Feet supported Static Sitting - Level of Assistance: 7: Independent Static Standing Balance Static Standing - Balance Support: No upper extremity supported;During functional activity Static Standing - Level of Assistance: 5: Stand by assistance   End of Session OT - End of Session Activity Tolerance: Patient tolerated treatment well Patient left: in bed;with call bell/phone within reach  GO     Amber Rowland A OTR/L [phone] 04/13/2012, 3:35 PM

## 2012-04-13 NOTE — Progress Notes (Signed)
Physical Therapy Treatment Patient Details Name: GIRLIE AUEN MRN: [id number] DOB: October 05, 1933 Today's Date: 04/13/2012 Time: 1030-1055 PT Time Calculation (min): 25 min  PT Assessment / Plan / Recommendation Comments on Treatment Session  SBO w/ NG tube @ NPO.  Assisted pt OOB to amb in hallway then positioned in recliner.  Pt plans to return home.    Follow Up Recommendations  No PT follow up     Does the patient have the potential to tolerate intense rehabilitation     Barriers to Discharge        Equipment Recommendations  None recommended by PT    Recommendations for Other Services    Frequency Min 3X/week   Plan Discharge plan remains appropriate    Precautions / Restrictions Precautions Precautions: Fall Precaution Comments: NPO, NG tube, COPD Restrictions Weight Bearing Restrictions: No   Pertinent Vitals/Pain C/o hunger    Mobility  Bed Mobility Bed Mobility: Supine to Sit Supine to Sit: 6: Modified independent (Device/Increase time) Details for Bed Mobility Assistance: increased time  Transfers Transfers: Sit to Stand;Stand to Sit Sit to Stand: 5: Supervision;4: Min guard;From bed Stand to Sit: 5: Supervision;4: Min guard Details for Transfer Assistance: one VC on safety with IV line  Ambulation/Gait Ambulation/Gait Assistance: 4: Min guard Ambulation Distance (Feet): 175 Feet Assistive device: Rolling walker Ambulation/Gait Assistance Details: Pt does NOT usually use a RW, however used one today for increased safety Gait Pattern: Step-through pattern Gait velocity: normal    PT Goals                      progressing    Visit Information  Last PT Received On: 04/13/12 Assistance Needed: +1    Subjective Data      Cognition       Balance     End of Session PT - End of Session Equipment Utilized During Treatment: Gait belt Activity Tolerance: Patient tolerated treatment well Patient left: in chair;with call bell/phone within  reach Nurse Communication: Mobility status  Felecia Shelling  PTA [facility]  Acute  Rehab Pager     [phone]

## 2012-04-13 NOTE — Evaluation (Signed)
Occupational Therapy Evaluation Patient Details Name: Amber Rowland MRN: [id number] DOB: 1934/04/24 Today's Date: 04/13/2012 Time: 7829-5621 OT Time Calculation (min): 18 min  OT Assessment / Plan / Recommendation Clinical Impression  Pt admitted with SBO. Pt is at a supervision level for all functional ADL tasks and feel she will likely be able to return to PLOF. Skilled OT indicated to maximize independence with BADLs to independent/mod I level in prep for safe d/c home.    OT Assessment  Patient needs continued OT Services    Follow Up Recommendations  No OT follow up    Barriers to Discharge      Equipment Recommendations  None recommended by OT    Recommendations for Other Services    Frequency  Min 2X/week    Precautions / Restrictions Precautions Precautions: Fall Precaution Comments: NPO, NG tube, COPD Restrictions Weight Bearing Restrictions: No   Pertinent Vitals/Pain Pt reported 5/10 upper abdominal pain. Repositioned for comfort.    ADL  Grooming: Simulated;Supervision/safety Where Assessed - Grooming: Unsupported standing Upper Body Bathing: Simulated;Supervision/safety Where Assessed - Upper Body Bathing: Unsupported standing Lower Body Bathing: Simulated;Supervision/safety Where Assessed - Lower Body Bathing: Unsupported sit to stand Upper Body Dressing: Simulated;Supervision/safety Where Assessed - Upper Body Dressing: Unsupported standing Lower Body Dressing: Simulated;Supervision/safety Where Assessed - Lower Body Dressing: Unsupported sit to stand Toilet Transfer: Performed;Supervision/safety Toilet Transfer Method: Sit to Barista: Comfort height toilet Toileting - Clothing Manipulation and Hygiene: Performed;Supervision/safety Where Assessed - Engineer, mining and Hygiene: Sit to stand from 3-in-1 or toilet Equipment Used: Rolling walker Transfers/Ambulation Related to ADLs: Pt ambulated to the bathroom  pushing IV pole with supervision. ADL Comments: Pt's NG tube was clamped upon OT's arrival. Pt stated she tolerated some sips of coffee without nausea.    OT Diagnosis: Generalized weakness  OT Problem List: Decreased activity tolerance;Decreased knowledge of use of DME or AE;Pain OT Treatment Interventions: Self-care/ADL training;Therapeutic activities;DME and/or AE instruction;Patient/family education   OT Goals Acute Rehab OT Goals OT Goal Formulation: With patient Time For Goal Achievement: 04/27/12 Potential to Achieve Goals: Good ADL Goals Pt Will Perform Grooming: Independently;Standing at sink ADL Goal: Grooming - Progress: Goal set today Pt Will Transfer to Toilet: with modified independence;Comfort height toilet;Ambulation;Regular height toilet ADL Goal: Toilet Transfer - Progress: Goal set today Pt Will Perform Toileting - Clothing Manipulation: Independently;Sitting on 3-in-1 or toilet;Standing ADL Goal: Toileting - Clothing Manipulation - Progress: Goal set today Pt Will Perform Toileting - Hygiene: Independently;Sit to stand from 3-in-1/toilet ADL Goal: Toileting - Hygiene - Progress: Goal set today Pt Will Perform Tub/Shower Transfer: Tub transfer;with modified independence;Ambulation;Grab bars ADL Goal: Tub/Shower Transfer - Progress: Goal set today Additional ADL Goal #1: Pt will complete all aspects of bathing and dressing including item retrieval at a mod I level. ADL Goal: Additional Goal #1 - Progress: Goal set today  Visit Information  Last OT Received On: 04/13/12 Assistance Needed: +1    Subjective Data  Subjective: I don't want to take my socks off. I don't want you to see my toenails. Patient Stated Goal: To not have to have sx.   Prior Functioning     Home Living Lives With: Family Available Help at Discharge: Family (if pt has to have sx she will be staying with her daughter) Type of Home: Apartment Home Access: Stairs to enter Entrance  Stairs-Number of Steps: 6 in back, 3 in front. Entrance Stairs-Rails: Right Home Layout: One level Bathroom Shower/Tub: Engineer, manufacturing systems:  Handicapped height Home Adaptive Equipment: Grab bars in shower Prior Function Level of Independence: Independent Able to Take Stairs?: Yes Driving: No Vocation: Retired Musician: No difficulties Dominant Hand: Right         Vision/Perception     Cognition  Overall Cognitive Status: Appears within functional limits for tasks assessed/performed Arousal/Alertness: Awake/alert Orientation Level: Appears intact for tasks assessed Behavior During Session: [facility] for tasks performed    Extremity/Trunk Assessment Right Upper Extremity Assessment RUE ROM/Strength/Tone: [facility] for tasks assessed Left Upper Extremity Assessment LUE ROM/Strength/Tone: WFL for tasks assessed     Mobility Bed Mobility Bed Mobility: Supine to Sit Supine to Sit: 6: Modified independent (Device/Increase time) Sit to Supine: 6: Modified independent (Device/Increase time) Details for Bed Mobility Assistance: increased time Transfers Sit to Stand: 5: Supervision;With upper extremity assist;From bed;From toilet Stand to Sit: 5: Supervision;With upper extremity assist;To bed;To toilet Details for Transfer Assistance: Pt demo'd good safety awareness throughout.     Shoulder Instructions     Exercise     Balance Static Sitting Balance Static Sitting - Balance Support: No upper extremity supported;Feet supported Static Sitting - Level of Assistance: 7: Independent Static Standing Balance Static Standing - Balance Support: No upper extremity supported;During functional activity Static Standing - Level of Assistance: 5: Stand by assistance   End of Session OT - End of Session Activity Tolerance: Patient tolerated treatment well Patient left: in bed;with call bell/phone within reach  GO     Tyna Huertas A OTR/L  [phone] 04/13/2012, 3:37 PM

## 2012-04-14 ENCOUNTER — Inpatient Hospital Stay (HOSPITAL_COMMUNITY): Payer: Medicare Other

## 2012-04-14 LAB — COMPREHENSIVE METABOLIC PANEL
ALT: 13 U/L (ref 0–35)
AST: 19 U/L (ref 0–37)
Albumin: 2.5 g/dL — ABNORMAL LOW (ref 3.5–5.2)
Alkaline Phosphatase: 69 U/L (ref 39–117)
BUN: 5 mg/dL — ABNORMAL LOW (ref 6–23)
CO2: 37 mEq/L — ABNORMAL HIGH (ref 19–32)
Calcium: 9 mg/dL (ref 8.4–10.5)
Chloride: 97 mEq/L (ref 96–112)
Creatinine, Ser: 0.77 mg/dL (ref 0.50–1.10)
GFR calc Af Amer: >90 mL/min (ref >90)
GFR calc non Af Amer: 80 mL/min — ABNORMAL LOW (ref >90)
Glucose, Bld: 103 mg/dL — ABNORMAL HIGH (ref 70–99)
Potassium: 3.3 mEq/L — ABNORMAL LOW (ref 3.5–5.1)
Sodium: 137 mEq/L (ref 135–145)
Total Bilirubin: 0.3 mg/dL (ref 0.3–1.2)
Total Protein: 5.3 g/dL — ABNORMAL LOW (ref 6.0–8.3)

## 2012-04-14 NOTE — Progress Notes (Signed)
Patient ID: [name], female   DOB: 03-27-1934, 78 y.o.   MRN: [id number]    Subjective: Pt wo complaints, still bulching a lot, very hungry, denies n/v, denies flatus or BM, abd sore but no severe pain.  Objective: Vital signs in last 24 hours: Temp:  [98.1 F (36.7 C)-98.4 F (36.9 C)] 98.1 F (36.7 C) (10/24 0627) Pulse Rate:  [63-68] 68  (10/24 0627) Resp:  [14-16] 14  (10/24 0627) BP: (128-171)/(73-84) 171/84 mmHg (10/24 0627) SpO2:  [91 %-99 %] 93 % (10/24 0627) Last BM Date: 04/12/12  Intake/Output from previous day: 03-May-2023 0701 - 10/24 0700 In: 2413.3 [I.V.:2413.3] Out: 2875 [Urine:1575; Emesis/NG output:1300] Intake/Output this shift:    PE: Abd: distended, tender to palp but only mildly, faint bowel sounds  Lab Results:   [facility] 02-May-2012 0433 04/12/12 0020  WBC 9.4 18.4*  HGB 12.4 15.8*  HCT 39.4 48.3*  PLT 148* 210   BMET  Basename 04/14/12 0500 05-02-2012 0433  NA 137 137  K 3.3* 3.8  CL 97 98  CO2 37* 38*  GLUCOSE 103* 109*  BUN 5* 11  CREATININE 0.77 0.89  CALCIUM 9.0 8.9   PT/INR No results found for this basename: LABPROT:2,INR:2 in the last 72 hours CMP     Component Value Date/Time   NA 137 04/14/2012 0500   K 3.3* 04/14/2012 0500   CL 97 04/14/2012 0500   CO2 37* 04/14/2012 0500   GLUCOSE 103* 04/14/2012 0500   BUN 5* 04/14/2012 0500   CREATININE 0.77 04/14/2012 0500   CALCIUM 9.0 04/14/2012 0500   PROT 5.3* 04/14/2012 0500   ALBUMIN 2.5* 04/14/2012 0500   AST 19 04/14/2012 0500   ALT 13 04/14/2012 0500   ALKPHOS 69 04/14/2012 0500   BILITOT 0.3 04/14/2012 0500   GFRNONAA 80* 04/14/2012 0500   GFRAA >90 04/14/2012 0500   Lipase     Component Value Date/Time   LIPASE 19 11/05/2007 1455       Studies/Results: Dg Abd 2 Views  2012/05/02  *RADIOLOGY REPORT*  Clinical Data: Abdominal pain, follow up SBO  ABDOMEN - 2 VIEW  Comparison: CT abdomen pelvis dated 04/12/2012  Findings: Enteric tube likely terminates in the  stomach.  Paucity of bowel gas.  When correlating with prior CT, this likely reflects multiple fluid filled loops of small bowel, which limits the sensitivity of radiographs for detecting small bowel obstruction.  No evidence of free air on the lateral decubitus view.  Degenerative changes of the visualized thoracolumbar spine.  IMPRESSION: Paucity of bowel gas, likely corresponding to multiple fluid-filled loops of small bowel on the prior CT, limiting the sensitivity of radiographs for detecting small bowel obstruction.  No free air.   Original Report Authenticated By: Charline Bills, M.D.     Anti-infectives: Anti-infectives     Start     Dose/Rate Route Frequency Ordered Stop   04/12/12 0445   cefTRIAXone (ROCEPHIN) 1 g in dextrose 5 % 50 mL IVPB        1 g 100 mL/hr over 30 Minutes Intravenous  Once 04/12/12 0435 04/12/12 0615           Assessment/Plan  1.  SBO: films seem to have gas in distal colon now, but still no bowel function.  Patient more open to an operation if needed now.  WBC normalized now  --repeat films in AM  --follow labs  --leave NGT in place  --ambulate  --supp today  LOS: 3 days  Jaymes Hang, Helyne 04/14/2012

## 2012-04-15 ENCOUNTER — Inpatient Hospital Stay (HOSPITAL_COMMUNITY): Payer: Medicare Other

## 2012-04-15 MED ORDER — KCL IN DEXTROSE-NACL 20-5-0.45 MEQ/L-%-% IV SOLN
INTRAVENOUS | Status: AC
  Administered 2012-04-15 – 2012-04-16 (×3): via INTRAVENOUS
  Filled 2012-04-15 (×5): qty 1000

## 2012-04-15 NOTE — Progress Notes (Signed)
Patient ID: Amber Rowland, female   DOB: 02/13/1934, 78 y.o.   MRN: [id number]    Subjective: Pt wo complaints, still bulching a lot, very hungry, denies n/v, patient had small BM with supp yesterday and some flatus  Objective: Vital signs in last 24 hours: Temp:  [97.9 F (36.6 C)-98.3 F (36.8 C)] 97.9 F (36.6 C) (10/25 0551) Pulse Rate:  [60-78] 60  (10/25 0551) Resp:  [16-20] 16  (10/25 0551) BP: (123-168)/(72-76) 168/76 mmHg (10/25 0551) SpO2:  [94 %-100 %] 98 % (10/25 0551) Last BM Date: 04/14/12  Intake/Output from previous day: 10/24 0701 - 10/25 0700 In: 2491.7 [I.V.:2391.7; NG/GT:100] Out: 1525 [Urine:550; Emesis/NG output:975] Intake/Output this shift:    PE: Abd: mildly distended, tender to palp but only mildly, faint bowel sounds  Lab Results:   [facility] 04/30/2012 0433  WBC 9.4  HGB 12.4  HCT 39.4  PLT 148*   BMET  Basename 04/14/12 0500 2012/04/30 0433  NA 137 137  K 3.3* 3.8  CL 97 98  CO2 37* 38*  GLUCOSE 103* 109*  BUN 5* 11  CREATININE 0.77 0.89  CALCIUM 9.0 8.9   PT/INR No results found for this basename: LABPROT:2,INR:2 in the last 72 hours CMP     Component Value Date/Time   NA 137 04/14/2012 0500   K 3.3* 04/14/2012 0500   CL 97 04/14/2012 0500   CO2 37* 04/14/2012 0500   GLUCOSE 103* 04/14/2012 0500   BUN 5* 04/14/2012 0500   CREATININE 0.77 04/14/2012 0500   CALCIUM 9.0 04/14/2012 0500   PROT 5.3* 04/14/2012 0500   ALBUMIN 2.5* 04/14/2012 0500   AST 19 04/14/2012 0500   ALT 13 04/14/2012 0500   ALKPHOS 69 04/14/2012 0500   BILITOT 0.3 04/14/2012 0500   GFRNONAA 80* 04/14/2012 0500   GFRAA >90 04/14/2012 0500   Lipase     Component Value Date/Time   LIPASE 19 11/05/2007 1455       Studies/Results: Dg Abd 2 Views  04-30-2012  *RADIOLOGY REPORT*  Clinical Data: Abdominal pain, follow up SBO  ABDOMEN - 2 VIEW  Comparison: CT abdomen pelvis dated 04/12/2012  Findings: Enteric tube likely terminates in the stomach.   Paucity of bowel gas.  When correlating with prior CT, this likely reflects multiple fluid filled loops of small bowel, which limits the sensitivity of radiographs for detecting small bowel obstruction.  No evidence of free air on the lateral decubitus view.  Degenerative changes of the visualized thoracolumbar spine.  IMPRESSION: Paucity of bowel gas, likely corresponding to multiple fluid-filled loops of small bowel on the prior CT, limiting the sensitivity of radiographs for detecting small bowel obstruction.  No free air.   Original Report Authenticated By: Charline Bills, M.D.    Dg Abd Portable 1v  04/14/2012  *RADIOLOGY REPORT*  Clinical Data: Small bowel obstruction.  PORTABLE ABDOMEN - 1 VIEW  Comparison: 04-30-2012.  Findings: There is a small amount of gas and stool noted in the colon.  However, there are dilated loops of small bowel measuring up to 3.7 cm in diameter.  A 12 mm left renal calculus again noted. Nasogastric tube extends into the stomach.  No gross evidence of pneumoperitoneum on this supine view.  IMPRESSION: 1.  Findings are compatible with a small bowel obstruction.  Given the small amount of gas and stool noted in the colon, this may be early or partial. 2.  12 mm left renal calculus again noted.   Original Report Authenticated By:  Florencia Reasons, M.D.     Anti-infectives: Anti-infectives     Start     Dose/Rate Route Frequency Ordered Stop   04/12/12 0445   cefTRIAXone (ROCEPHIN) 1 g in dextrose 5 % 50 mL IVPB        1 g 100 mL/hr over 30 Minutes Intravenous  Once 04/12/12 0435 04/12/12 0615           Assessment/Plan  1.  SBO: Pt with small bm due to supp, films really unchanged, Patient may still need operation if still not improving.  WBC normalized now  --repeat films in AM  --follow labs  --leave NGT in place but will clamp today and see how see does  --ambulate  --supp today 2.  Hypokalemia: likely due to NPO status  --add KCL to fluids  --follow  labs  LOS: 4 days    Parish Dubose, Emmalyn 04/15/2012

## 2012-04-15 NOTE — Progress Notes (Signed)
PT Cancellation Note  ___Treatment cancelled today due to medical issues with patient which prohibited therapy  ___ Treatment cancelled today due to patient receiving procedure or test   ___ Treatment cancelled today due to patient's refusal to participate   _X_ Treatment cancelled today due to request "Not right now I am waiting for the nurse"    Amber Rowland  PTA [facility]  Acute  Rehab Pager     [phone]

## 2012-04-15 NOTE — Progress Notes (Signed)
Nutrition Consult  Pt with prolonged SBO, NPO.  Consult to assess for TPN need.  Full RD assessment 10/22.  Pt underweight with non-severe malnutrition in the context of chronic illness.  Unknown amounts weight loss prior to admit.    Per PA note, pt with a lot of belching, very hungry.  Small BM with suppository yesterday and some flatus but films unchanged.  NG tube in place.  Consideration for surgery.  NPO except for ice chips.  No po for 1 week.  Poor nutritional habits prior to that.  Rec TPN if SBO does not improve and unable to advance diet.  Oran Rein, RD, LDN Clinical Inpatient Dietitian Pager:  [phone] Weekend and after hours pager:  [phone]

## 2012-04-15 NOTE — Progress Notes (Signed)
Occupational Therapy Treatment Patient Details Name: Amber Rowland MRN: [id number] DOB: 08/09/1933 Today's Date: 04/15/2012 Time: 7829-5621 OT Time Calculation (min): 21 min  OT Assessment / Plan / Recommendation Comments on Treatment Session Pt tolerated session well. Worked on Engineer, building services with functional transfers. Pt stating she is not sure what d/c plan is. Depends on if she has to have surgery she reports. She states her daughter wants her to come stay with her at discharge. Will continue to monitor discharge destination and needs.     Follow Up Recommendations  No OT follow up;Supervision - Intermittent    Barriers to Discharge       Equipment Recommendations  None recommended by OT    Recommendations for Other Services    Frequency Min 2X/week   Plan Discharge plan remains appropriate    Precautions / Restrictions Precautions Precautions: Fall Precaution Comments: NPO, NG tube, COPD Restrictions Weight Bearing Restrictions: No        ADL  Grooming: Set up;Wash/dry hands Where Assessed - Grooming: Supported sitting Toilet Transfer: Performed;Min guard Acupuncturist: Comfort height toilet;Grab bars Toileting - Architect and Hygiene: Simulated;Min guard Where Assessed - Engineer, mining and Hygiene: Sit to stand from 3-in-1 or toilet ADL Comments: Pt encouraged to use RW for safety as she states she feels weak and report some dizziness even before getting OOB. States dizziness improved as she was up with OT. Attempted to have pt sit on commode without grab bar use but pt tending to want to reach and use grab bar right now. She states she had a suppository about an hour before OT arrived and was able to have very small amount of bowel movement . Nursing tech informed. Pt needs intermittant verbal cues for safety to use RW as she let go of it right in the middle of the room and tried to step around to the chair without it and  gave no warning.     OT Diagnosis:    OT Problem List:   OT Treatment Interventions:     OT Goals ADL Goals ADL Goal: Toilet Transfer - Progress: Other (comment) (today min guard for safety and pt with some dizziness) ADL Goal: Toileting - Clothing Manipulation - Progress: Not progressing  Visit Information  Last OT Received On: 04/15/12 Assistance Needed: +1    Subjective Data  Subjective: I need to try to go to the bathroom Patient Stated Goal: none stated. agreeable up with OT   Prior Functioning       Cognition  Overall Cognitive Status: Impaired Area of Impairment: Safety/judgement Arousal/Alertness: Awake/alert Behavior During Session: WFL for tasks performed Safety/Judgement: Decreased safety judgement for tasks assessed Safety/Judgement - Other Comments: Lets go of RW in the middle of the room and tries to step around it to the chair. Encouraged RW use all the way to the chair.     Mobility  Shoulder Instructions Bed Mobility Bed Mobility: Supine to Sit Supine to Sit: 6: Modified independent (Device/Increase time);HOB elevated Transfers Transfers: Sit to Stand;Stand to Sit Sit to Stand: 4: Min guard;With upper extremity assist;From bed;From toilet Stand to Sit: 4: Min guard;With upper extremity assist;To chair/3-in-1;To toilet Details for Transfer Assistance: min verbal cues for hand placement and min guard for safety as pt initially dizzy with OOB.        Exercises      Balance     End of Session OT - End of Session Activity Tolerance: Patient tolerated treatment well;Other (comment) (some  initial dizziness with OOB but better with time) Patient left: in chair;with call bell/phone within reach  GO     Lennox Laity [phone] 04/15/2012, 12:16 PM

## 2012-04-16 ENCOUNTER — Inpatient Hospital Stay (HOSPITAL_COMMUNITY): Payer: Medicare Other

## 2012-04-16 LAB — CBC
HCT: 37.3 % (ref 36.0–46.0)
Hemoglobin: 12.2 g/dL (ref 12.0–15.0)
MCH: 30.6 pg (ref 26.0–34.0)
MCHC: 32.7 g/dL (ref 30.0–36.0)
MCV: 93.5 fL (ref 78.0–100.0)
Platelets: 124 10*3/uL — ABNORMAL LOW (ref 150–400)
RBC: 3.99 MIL/uL (ref 3.87–5.11)
RDW: 12.8 % (ref 11.5–15.5)
WBC: 4.7 10*3/uL (ref 4.0–10.5)

## 2012-04-16 LAB — BASIC METABOLIC PANEL
BUN: 3 mg/dL — ABNORMAL LOW (ref 6–23)
CO2: 35 mEq/L — ABNORMAL HIGH (ref 19–32)
Calcium: 9.1 mg/dL (ref 8.4–10.5)
Chloride: 98 mEq/L (ref 96–112)
Creatinine, Ser: 0.73 mg/dL (ref 0.50–1.10)
GFR calc Af Amer: >90 mL/min (ref >90)
GFR calc non Af Amer: 81 mL/min — ABNORMAL LOW (ref >90)
Glucose, Bld: 96 mg/dL (ref 70–99)
Potassium: 3.7 mEq/L (ref 3.5–5.1)
Sodium: 136 mEq/L (ref 135–145)

## 2012-04-16 LAB — GLUCOSE, CAPILLARY: Glucose-Capillary: 95 mg/dL (ref 70–99)

## 2012-04-16 MED ORDER — KCL IN DEXTROSE-NACL 20-5-0.45 MEQ/L-%-% IV SOLN
INTRAVENOUS | Status: AC
  Administered 2012-04-16 – 2012-04-17 (×3): via INTRAVENOUS
  Filled 2012-04-16 (×2): qty 1000

## 2012-04-16 MED ORDER — SODIUM CHLORIDE 0.9 % IJ SOLN
10.0000 mL | INTRAMUSCULAR | Status: DC | PRN
  Administered 2012-04-20 – 2012-04-23 (×2): 10 mL
  Administered 2012-04-24: 20 mL
  Administered 2012-04-26 – 2012-05-06 (×4): 10 mL
  Administered 2012-05-10: 20 mL

## 2012-04-16 MED ORDER — CLINIMIX E/DEXTROSE (5/15) 5 % IV SOLN
INTRAVENOUS | Status: AC
  Administered 2012-04-16: 18:00:00 via INTRAVENOUS
  Filled 2012-04-16: qty 1000

## 2012-04-16 NOTE — Progress Notes (Addendum)
PARENTERAL NUTRITION CONSULT NOTE - INITIAL  Pharmacy Consult for TNA Indication: SBO  No Known Allergies  Patient Measurements: Height: 4\' 9"  (144.8 cm) Weight: 85 lb (38.556 kg) IBW/kg (Calculated) : 38.6    Vital Signs: Temp: 97.4 F (36.3 C) (10/26 0617) Temp src: Oral (10/26 0617) BP: 171/79 mmHg (10/26 0617) Pulse Rate: 58  (10/26 0617) Intake/Output from previous day: 10/25 0701 - 10/26 0700 In: 2456.7 [I.V.:2456.7] Out: 1926 [Urine:1025; Emesis/NG output:900; Stool:1] Intake/Output from this shift:    Labs:  [facility] 04/16/12 0505  WBC 4.7  HGB 12.2  HCT 37.3  PLT 124*  APTT --  INR --     Basename 04/16/12 0505 04/14/12 0500  NA 136 137  K 3.7 3.3*  CL 98 97  CO2 35* 37*  GLUCOSE 96 103*  BUN 3* 5*  CREATININE 0.73 0.77  LABCREA -- --  CREAT24HRUR -- --  CALCIUM 9.1 9.0  MG -- --  PHOS -- --  PROT -- 5.3*  ALBUMIN -- 2.5*  AST -- 19  ALT -- 13  ALKPHOS -- 69  BILITOT -- 0.3  BILIDIR -- --  IBILI -- --  PREALBUMIN -- --  TRIG -- --  CHOLHDL -- --  CHOL -- --   Estimated Creatinine Clearance: 36.5 ml/min (by C-G formula based on Cr of 0.73).   No results found for this basename: GLUCAP:3 in the last 72 hours  Medical History: Past Medical History  Diagnosis Date  . Glaucoma (increased eye pressure)   . Ovarian cyst, right 2009    s/p BSO 2009: OVARIAN FIBROTHECOMA in multiple fluid filled cysts  4.5cm region  . Atrophy of left kidney     Chronic atrophy of the left kidney.  . Chronic constipation   . COPD (chronic obstructive pulmonary disease)     Hyperinflation and heavy smoking history strongly  . HTN (hypertension) 04/12/2012  . Benign breast cysts in female 04/12/2012    Seen on mammography 2009   . UTI (lower urinary tract infection)     History of chronic recurrent UTIs    Medications:  Scheduled:    . bisacodyl  10 mg Rectal Daily  . heparin  5,000 Units Subcutaneous Q8H  . lip balm  1 application Topical BID    Infusions:    . dextrose 5 % and 0.45 % NaCl with KCl 20 mEq/L 100 mL/hr at 04/16/12 0531   Insulin Requirements in the past 24 hours:  No insulin ordered  Nutritional Goals:  RD recs (1022): 1300-1500 kcal and 60-80 g protein per day Clinimix E 5/15  at a goal rate of 51ml/hr + IVFE 20% at 38ml/hr on MWF to provide: 84 g/day protein, 1398 Kcal/day avg.(1193 Kcal/day MWF, 1672 Kcal/day STTHS).  Current Nutrition:  NPO  IVF: dextrose 5% and 0.45 % NaCl with KCl 20 mEq/L @ 184ml/hr   Assessment: 78 YOF admitted 10/21 w/ abdominal pain, vomiting and not tolerating POs. Found to have SBO and tx non-sugically w/ NG decompression. Has been NPO since admission. TNA to start today.   Electrolytes: wnl  CBG: not yet ordered, serum glucose ok  LFTs wnl 10/24  TGs: pending for AM  Prealbumin: pending for AM (will result 10/27 PM))   Plan:  At 1800 today:   Start Clinimix E 5/15% @ 51ml/hr, increase to goal rate of 64ml/hr as tolerated. Verified w/ IV team RN that PICC is expected to be placed today before TNA start time  Adjust MIVF to account  for TNA volume  TNA to contain IV fat emulsion, standard multivitamins and trace elements only on MWF only due to ongoing shortage  Start CBG checks per TNA protocol, no SSI for now. Monitor CBG and start SSI if CBG > 150  TNA labs AM after start of TNA and then Q Monday/Thursdays   Pharmacy will follow up daily  Gwen Her PharmD  [phone] 04/16/2012 9:11 AM

## 2012-04-16 NOTE — Progress Notes (Signed)
Peripherally Inserted Central Catheter/Midline Placement  The IV Nurse has discussed with the patient and/or persons authorized to consent for the patient, the purpose of this procedure and the potential benefits and risks involved with this procedure.  The benefits include less needle sticks, lab draws from the catheter and patient may be discharged home with the catheter.  Risks include, but not limited to, infection, bleeding, blood clot (thrombus formation), and puncture of an artery; nerve damage and irregular heat beat.  Alternatives to this procedure were also discussed.  PICC/Midline Placement Documentation        Amber Rowland 04/16/2012, 12:58 PM

## 2012-04-16 NOTE — Progress Notes (Signed)
Patient ID: Amber Rowland, female   DOB: 09/15/1933, 78 y.o.   MRN: [id number]    Subjective: Pt wo complaints, still belching a lot, denies n/v, no flatus or BM yesterday  Objective: Vital signs in last 24 hours: Temp:  [97.4 F (36.3 C)-98.9 F (37.2 C)] 97.4 F (36.3 C) (10/26 0617) Pulse Rate:  [58-70] 58  (10/26 0617) Resp:  [16] 16  (10/26 0617) BP: (148-171)/(71-79) 171/79 mmHg (10/26 0617) SpO2:  [96 %-100 %] 96 % (10/26 0617) Last BM Date: 04/15/12  Intake/Output from previous day: 10/25 0701 - 10/26 0700 In: 2456.7 [I.V.:2456.7] Out: 1926 [Urine:1025; Emesis/NG output:900; Stool:1] Intake/Output this shift:    PE: Abd: mildly distended, nontender to palpation Lab Results:   [facility] 04/16/12 0505  WBC 4.7  HGB 12.2  HCT 37.3  PLT 124*   BMET  Basename 04/16/12 0505 04/14/12 0500  NA 136 137  K 3.7 3.3*  CL 98 97  CO2 35* 37*  GLUCOSE 96 103*  BUN 3* 5*  CREATININE 0.73 0.77  CALCIUM 9.1 9.0   PT/INR No results found for this basename: LABPROT:2,INR:2 in the last 72 hours CMP     Component Value Date/Time   NA 136 04/16/2012 0505   K 3.7 04/16/2012 0505   CL 98 04/16/2012 0505   CO2 35* 04/16/2012 0505   GLUCOSE 96 04/16/2012 0505   BUN 3* 04/16/2012 0505   CREATININE 0.73 04/16/2012 0505   CALCIUM 9.1 04/16/2012 0505   PROT 5.3* 04/14/2012 0500   ALBUMIN 2.5* 04/14/2012 0500   AST 19 04/14/2012 0500   ALT 13 04/14/2012 0500   ALKPHOS 69 04/14/2012 0500   BILITOT 0.3 04/14/2012 0500   GFRNONAA 81* 04/16/2012 0505   GFRAA >90 04/16/2012 0505   Lipase     Component Value Date/Time   LIPASE 19 11/05/2007 1455       Studies/Results: Dg Abd Portable 1v  04/16/2012  *RADIOLOGY REPORT*  Clinical Data: Small bowel obstruction.  PORTABLE ABDOMEN - 1 VIEW  Comparison: 04/15/2012  Findings: Enteric tube tip in the left upper quadrant consistent with location in the lower stomach.  Persistent gaseous distension of small bowel with gas  filled nondistended colon.  Changes are consistent with small bowel obstruction.  No significant change since previous study.  IMPRESSION: Gaseous distension of mid abdominal small bowel consistent with obstruction.   Original Report Authenticated By: Marlon Pel, M.D.    Dg Abd Portable 1v  04/15/2012  *RADIOLOGY REPORT*  Clinical Data: Small bowel obstruction.  PORTABLE ABDOMEN - 1 VIEW  Comparison: Plain films of the abdomen 04/13/2012 and 04/14/2012. CT chest, abdomen and pelvis 04/12/2012.  Findings: NG tube remains in place.  Gaseous distention of small bowel persists with loops measuring up to 3.7 cm, unchanged.  No free intraperitoneal air is identified.  Calcification in the left upper quadrant consistent with a nonobstructing renal stone as seen on CT scan is noted.  IMPRESSION: No change in bowel gas pattern compatible with small bowel obstruction.   Original Report Authenticated By: Bernadene Bell. Maricela Curet, M.D.     Anti-infectives: Anti-infectives     Start     Dose/Rate Route Frequency Ordered Stop   04/12/12 0445   cefTRIAXone (ROCEPHIN) 1 g in dextrose 5 % 50 mL IVPB        1 g 100 mL/hr over 30 Minutes Intravenous  Once 04/12/12 0435 04/12/12 0615           Assessment/Plan  1.  SBO: Pt with small bm due to supp, films really unchanged. WBC normalized now  --follow labs  --leave NGT in place   --ambulate  --I suspect this will not get better with conservative management by looking at her history and her CT.  She currently has an albumin of 2.5 and would be at high risk for complications of a surgery.  I would like to place a PICC and start TPN.  I will continue the NG, as it is high output right now. 2.  Hypokalemia: likely due to NPO status  --better today   LOS: 5 days    Ahana Najera C. 04/16/2012

## 2012-04-17 LAB — COMPREHENSIVE METABOLIC PANEL
ALT: 14 U/L (ref 0–35)
AST: 24 U/L (ref 0–37)
Calcium: 9 mg/dL (ref 8.4–10.5)
Creatinine, Ser: 0.65 mg/dL (ref 0.50–1.10)
GFR calc Af Amer: >90 mL/min (ref >90)
GFR calc non Af Amer: 84 mL/min — ABNORMAL LOW (ref >90)
Sodium: 136 mEq/L (ref 135–145)
Total Protein: 5.3 g/dL — ABNORMAL LOW (ref 6.0–8.3)

## 2012-04-17 LAB — CBC
MCH: 31.1 pg (ref 26.0–34.0)
MCV: 92.9 fL (ref 78.0–100.0)
Platelets: 127 10*3/uL — ABNORMAL LOW (ref 150–400)
RBC: 3.92 MIL/uL (ref 3.87–5.11)
RDW: 12.7 % (ref 11.5–15.5)

## 2012-04-17 LAB — GLUCOSE, CAPILLARY: Glucose-Capillary: 108 mg/dL — ABNORMAL HIGH (ref 70–99)

## 2012-04-17 LAB — DIFFERENTIAL
Basophils Absolute: 0 10*3/uL (ref 0.0–0.1)
Basophils Relative: 0 % (ref 0–1)
Eosinophils Absolute: 0.2 10*3/uL (ref 0.0–0.7)
Eosinophils Relative: 4 % (ref 0–5)
Lymphs Abs: 0.9 10*3/uL (ref 0.7–4.0)
Neutrophils Relative %: 74 % (ref 43–77)

## 2012-04-17 LAB — CHOLESTEROL, TOTAL: Cholesterol: 129 mg/dL (ref 0–200)

## 2012-04-17 LAB — PHOSPHORUS: Phosphorus: 3.3 mg/dL (ref 2.3–4.6)

## 2012-04-17 LAB — MAGNESIUM: Magnesium: 1.9 mg/dL (ref 1.5–2.5)

## 2012-04-17 MED ORDER — KCL IN DEXTROSE-NACL 20-5-0.45 MEQ/L-%-% IV SOLN
INTRAVENOUS | Status: DC
  Administered 2012-04-18 – 2012-04-22 (×5): via INTRAVENOUS
  Administered 2012-04-23: 40 mL/h via INTRAVENOUS
  Filled 2012-04-17 (×9): qty 1000

## 2012-04-17 MED ORDER — CLINIMIX E/DEXTROSE (5/15) 5 % IV SOLN
INTRAVENOUS | Status: AC
  Administered 2012-04-17: 18:00:00 via INTRAVENOUS
  Filled 2012-04-17: qty 2000

## 2012-04-17 NOTE — Progress Notes (Signed)
Patient ID: Amber Rowland, female   DOB: [birth year], 78 y.o.   MRN: [id number]    Subjective: Pt wo complaints, still belching a lot, denies n/v, no flatus or BM yesterday  Objective: Vital signs in last 24 hours: Temp:  [97.9 F (36.6 C)-98.4 F (36.9 C)] 97.9 F (36.6 C) (10/27 0606) Pulse Rate:  [62-71] 71  (10/27 0606) Resp:  [16-20] 16  (10/27 0606) BP: (137-146)/(77-84) 137/82 mmHg (10/27 0606) SpO2:  [92 %-94 %] 92 % (10/27 0606) Last BM Date: 04/15/12  Intake/Output from previous day: 10/26 0701 - 10/27 0700 In: 1589.3 [P.O.:20; I.V.:970.7; NG/GT:120; TPN:478.7] Out: 3252 [Urine:1252; Emesis/NG output:1100] Intake/Output this shift:    PE: Abd: mildly distended, nontender to palpation Lab Results:   Basename 04/17/12 0455 04/16/12 0505  WBC 5.8 4.7  HGB 12.2 12.2  HCT 36.4 37.3  PLT 127* 124*   BMET  Basename 04/17/12 0455 04/16/12 0505  NA 136 136  K 3.8 3.7  CL 99 98  CO2 34* 35*  GLUCOSE 106* 96  BUN 7 3*  CREATININE 0.65 0.73  CALCIUM 9.0 9.1   PT/INR No results found for this basename: LABPROT:2,INR:2 in the last 72 hours CMP     Component Value Date/Time   NA 136 04/17/2012 0455   K 3.8 04/17/2012 0455   CL 99 04/17/2012 0455   CO2 34* 04/17/2012 0455   GLUCOSE 106* 04/17/2012 0455   BUN 7 04/17/2012 0455   CREATININE 0.65 04/17/2012 0455   CALCIUM 9.0 04/17/2012 0455   PROT 5.3* 04/17/2012 0455   ALBUMIN 2.4* 04/17/2012 0455   AST 24 04/17/2012 0455   ALT 14 04/17/2012 0455   ALKPHOS 64 04/17/2012 0455   BILITOT 0.3 04/17/2012 0455   GFRNONAA 84* 04/17/2012 0455   GFRAA >90 04/17/2012 0455   Lipase     Component Value Date/Time   LIPASE 19 11/05/2007 1455       Studies/Results: Dg Abd Portable 1v  04/16/2012  *RADIOLOGY REPORT*  Clinical Data: Small bowel obstruction.  PORTABLE ABDOMEN - 1 VIEW  Comparison: 04/15/2012  Findings: Enteric tube tip in the left upper quadrant consistent with location in the lower stomach.   Persistent gaseous distension of small bowel with gas filled nondistended colon.  Changes are consistent with small bowel obstruction.  No significant change since previous study.  IMPRESSION: Gaseous distension of mid abdominal small bowel consistent with obstruction.   Original Report Authenticated By: Marlon Pel, M.D.     Anti-infectives: Anti-infectives     Start     Dose/Rate Route Frequency Ordered Stop   04/12/12 0445   cefTRIAXone (ROCEPHIN) 1 g in dextrose 5 % 50 mL IVPB        1 g 100 mL/hr over 30 Minutes Intravenous  Once 04/12/12 0435 04/12/12 0615           Assessment/Plan  1.  SBO: Pt with sig flatus yesterday. NG output about the same.  Abd much more soft  --leave NGT in place, cont TPN  --ambulate  --I suspect this will not get better with conservative management by looking at her history and her CT.  She currently has an albumin of 2.5 and would be at high risk for complications of a surgery. TPN started. I will continue the NG. 2.  Hypokalemia: likely due to NG output  --better today   LOS: 6 days    Tashayla Therien C. 04/17/2012

## 2012-04-17 NOTE — Progress Notes (Signed)
Nutrition Follow-up  Intervention:    Pharmacy to dose TPN.  RD to follow.  Assessment:   Received consult for new TPN. Pt with SBO. NG tube in place for output. Possible surgery in future, if not improving.  Diet Order:  NPO  Current TPN regimen: Clinimix E 5/15 to 95ml/hr with 20% lipids MWF to provide 72 gm protein and an average 1228 kcal daily.   Noted goal rate of Clinimix E 5/15 at 45ml/hr with 20% lipids at 34ml/hr on MWF to provide 84 gm protein and average 1398 Kcal daily (>/= 100% estimated nutrition needs).  Meds: Scheduled Meds:   . bisacodyl  10 mg Rectal Daily  . heparin  5,000 Units Subcutaneous Q8H  . lip balm  1 application Topical BID   Continuous Infusions:   . dextrose 5 % and 0.45 % NaCl with KCl 20 mEq/L 100 mL/hr at 04/16/12 0933  . dextrose 5 % and 0.45 % NaCl with KCl 20 mEq/L 60 mL/hr at 04/17/12 0027  . dextrose 5 % and 0.45 % NaCl with KCl 20 mEq/L    . TPN (CLINIMIX) +/- additives 40 mL/hr at 04/16/12 1806  . TPN (CLINIMIX) +/- additives     PRN Meds:.diphenhydrAMINE, magic mouthwash, menthol-cetylpyridinium, morphine injection, ondansetron (ZOFRAN) IV, promethazine, sodium chloride  Labs:  CMP     Component Value Date/Time   NA 136 04/17/2012 0455   K 3.8 04/17/2012 0455   CL 99 04/17/2012 0455   CO2 34* 04/17/2012 0455   GLUCOSE 106* 04/17/2012 0455   BUN 7 04/17/2012 0455   CREATININE 0.65 04/17/2012 0455   CALCIUM 9.0 04/17/2012 0455   PROT 5.3* 04/17/2012 0455   ALBUMIN 2.4* 04/17/2012 0455   AST 24 04/17/2012 0455   ALT 14 04/17/2012 0455   ALKPHOS 64 04/17/2012 0455   BILITOT 0.3 04/17/2012 0455   GFRNONAA 84* 04/17/2012 0455   GFRAA >90 04/17/2012 0455     Intake/Output Summary (Last 24 hours) at 04/17/12 1021 Last data filed at 04/17/12 0606  Gross per 24 hour  Intake 1162.67 ml  Output   3002 ml  Net -1839.33 ml    Weight Status:  38.6 kg; no new weight  Estimated needs:  1300-1500 kcal, 60-80 gm  protein  Nutrition Dx:  -Inadequate oral intake (NI-2.1) r/t inability to eat AEB NPO. Status: Ongoing  Goal:  Advance diet as tolerated to regular diet; not applicable at this time. New Goal: To meet >/= 90% estimated nutrition needs via TPN.  Monitor: TPN regimen, labs, weight    Luan Moore, Trinda Pascal RD, LDN

## 2012-04-17 NOTE — Progress Notes (Signed)
PARENTERAL NUTRITION CONSULT NOTE - Follow Up  Pharmacy Consult for TNA Indication: SBO  No Known Allergies  Patient Measurements: Height: 4\' 9"  (144.8 cm) Weight: 85 lb (38.556 kg) IBW/kg (Calculated) : 38.6    Vital Signs: Temp: 97.9 F (36.6 C) (10/27 0606) Temp src: Oral (10/27 0606) BP: 137/82 mmHg (10/27 0606) Pulse Rate: 71  (10/27 0606) Intake/Output from previous day: 10/26 0701 - 10/27 0700 In: 2489.3 [P.O.:20; I.V.:970.7; NG/GT:1020; TPN:478.7] Out: 2352 [Urine:1252; Emesis/NG output:1100] Intake/Output from this shift: Total I/O In: 1162.7 [P.O.:20; I.V.:664; TPN:478.7] Out: 1252 [Urine:402; Emesis/NG output:850]  Labs:  [facility] 04/17/12 0455 04/16/12 0505  WBC 5.8 4.7  HGB 12.2 12.2  HCT 36.4 37.3  PLT 127* 124*  APTT -- --  INR -- --     Basename 04/17/12 0455 04/16/12 0505  NA 136 136  K 3.8 3.7  CL 99 98  CO2 34* 35*  GLUCOSE 106* 96  BUN 7 3*  CREATININE 0.65 0.73  LABCREA -- --  CREAT24HRUR -- --  CALCIUM 9.0 9.1  MG 1.9 --  PHOS 3.3 --  PROT 5.3* --  ALBUMIN 2.4* --  AST 24 --  ALT 14 --  ALKPHOS 64 --  BILITOT 0.3 --  BILIDIR -- --  IBILI -- --  PREALBUMIN -- --  TRIG -- --  CHOLHDL -- --  CHOL -- --   Estimated Creatinine Clearance: 36.5 ml/min (by C-G formula based on Cr of 0.65).    Basename 04/17/12 0027 04/16/12 1605  GLUCAP 108* 95    Medical History: Past Medical History  Diagnosis Date  . Glaucoma (increased eye pressure)   . Ovarian cyst, right 2009    s/p BSO 2009: OVARIAN FIBROTHECOMA in multiple fluid filled cysts  4.5cm region  . Atrophy of left kidney     Chronic atrophy of the left kidney.  . Chronic constipation   . COPD (chronic obstructive pulmonary disease)     Hyperinflation and heavy smoking history strongly  . HTN (hypertension) 04/12/2012  . Benign breast cysts in female 04/12/2012    Seen on mammography 2009   . UTI (lower urinary tract infection)     History of chronic recurrent UTIs      Medications:  Scheduled:     . bisacodyl  10 mg Rectal Daily  . heparin  5,000 Units Subcutaneous Q8H  . lip balm  1 application Topical BID   Infusions:     . dextrose 5 % and 0.45 % NaCl with KCl 20 mEq/L 100 mL/hr at 04/16/12 0933  . dextrose 5 % and 0.45 % NaCl with KCl 20 mEq/L 60 mL/hr at 04/17/12 0027  . TPN (CLINIMIX) +/- additives 40 mL/hr at 04/16/12 1806   Insulin Requirements in the past 24 hours:  No insulin ordered  Nutritional Goals:  RD recs (1022): 1300-1500 kcal and 60-80 g protein per day Clinimix E 5/15  at a goal rate of 51ml/hr + IVFE 20% at 81ml/hr on MWF to provide: 84 g/day protein, 1398 Kcal/day avg.(1193 Kcal/day MWF, 1672 Kcal/day STTHS).  Current Nutrition:  NPO  IVF: dextrose 5% and 0.45 % NaCl with KCl 20 mEq/L @ 3ml/hr  Assessment: 78 YOF admitted 10/21 w/ abdominal pain, vomiting and not tolerating POs. Found to have SBO and tx non-sugically w/ NG decompression. Has been NPO since admission. TNA started 10/26.  Patient appears to be tolerating TNA.   Electrolytes: wnl  CBG: controlled (< 150 mg/dL)  LFTs wnl  TGs: pending  Prealbumin: pending (will result 10/27 PM)   Plan:  At 1800 today:   Increase Clinimix E 5/15 to 70ml/hr.   Adjust MIVF to account for TNA volume  TNA to contain IV fat emulsion, standard multivitamins and trace elements only on MWF only due to ongoing shortage  No SSI for now.  Monitor CBG and start SSI if CBG > 150  TNA labs every Monday/Thursday.  Pharmacy will follow up daily  Clance Boll, PharmD, BCPS Pager: [phone] 04/17/2012 6:49 AM

## 2012-04-18 ENCOUNTER — Inpatient Hospital Stay (HOSPITAL_COMMUNITY): Payer: Medicare Other

## 2012-04-18 LAB — CBC
MCH: 31.3 pg (ref 26.0–34.0)
MCHC: 33.4 g/dL (ref 30.0–36.0)
MCV: 93.5 fL (ref 78.0–100.0)
Platelets: 129 10*3/uL — ABNORMAL LOW (ref 150–400)
RBC: 3.87 MIL/uL (ref 3.87–5.11)

## 2012-04-18 LAB — COMPREHENSIVE METABOLIC PANEL
ALT: 13 U/L (ref 0–35)
Alkaline Phosphatase: 65 U/L (ref 39–117)
BUN: 16 mg/dL (ref 6–23)
CO2: 33 mEq/L — ABNORMAL HIGH (ref 19–32)
Chloride: 100 mEq/L (ref 96–112)
GFR calc Af Amer: >90 mL/min (ref >90)
GFR calc non Af Amer: 85 mL/min — ABNORMAL LOW (ref >90)
Glucose, Bld: 101 mg/dL — ABNORMAL HIGH (ref 70–99)
Potassium: 4.2 mEq/L (ref 3.5–5.1)
Sodium: 137 mEq/L (ref 135–145)
Total Bilirubin: 0.3 mg/dL (ref 0.3–1.2)
Total Protein: 5.6 g/dL — ABNORMAL LOW (ref 6.0–8.3)

## 2012-04-18 LAB — DIFFERENTIAL
Basophils Relative: 0 % (ref 0–1)
Eosinophils Absolute: 0.2 10*3/uL (ref 0.0–0.7)
Eosinophils Relative: 3 % (ref 0–5)
Lymphs Abs: 1 10*3/uL (ref 0.7–4.0)
Neutrophils Relative %: 75 % (ref 43–77)

## 2012-04-18 LAB — CHOLESTEROL, TOTAL: Cholesterol: 127 mg/dL (ref 0–200)

## 2012-04-18 LAB — GLUCOSE, CAPILLARY: Glucose-Capillary: 103 mg/dL — ABNORMAL HIGH (ref 70–99)

## 2012-04-18 LAB — MAGNESIUM: Magnesium: 1.9 mg/dL (ref 1.5–2.5)

## 2012-04-18 MED ORDER — FAT EMULSION 20 % IV EMUL
250.0000 mL | INTRAVENOUS | Status: AC
  Administered 2012-04-18: 250 mL via INTRAVENOUS
  Filled 2012-04-18: qty 250

## 2012-04-18 MED ORDER — ZINC TRACE METAL 1 MG/ML IV SOLN
INTRAVENOUS | Status: AC
  Administered 2012-04-18: 18:00:00 via INTRAVENOUS
  Filled 2012-04-18: qty 2000

## 2012-04-18 NOTE — Progress Notes (Signed)
CARE MANAGEMENT NOTE 04/18/2012  Patient:  [facility]   Account Number:  [id number]  Date Initiated:  04/18/2012  Documentation initiated by:  Colleen Can  Subjective/Objective Assessment:   dx SBO, malnutrition, hypokalemia     Action/Plan:   Cm spoke with patient. Pt is from home in [place]. Current plans are for her to go to daughter's home in [facility] where daughter and son-in-law will be caregivers upon discharge from hospital. Currently is ambulatory   Anticipated DC Date:  04/21/2012   Anticipated DC Plan:  HOME W HOME HEALTH SERVICES      DC Planning Services  CM consult      Choice offered to / List presented to:             Status of service:  In process, will continue to follow Comments:  04/18/2012 Raynelle Bring BSN CCM [phone] Pt con't with NG in place. NPO x ice chips. on Tpn and IV flds. KUB today. CM will con't to follow for  needs.

## 2012-04-18 NOTE — Progress Notes (Signed)
PARENTERAL NUTRITION CONSULT NOTE   Pharmacy Consult for TNA Indication: SBO  No Known Allergies  Patient Measurements: Height: 4\' 9"  (144.8 cm) Weight: 85 lb (38.556 kg) IBW/kg (Calculated) : 38.6    Vital Signs: Temp: 98.4 F (36.9 C) (10/28 0617) Temp src: Oral (10/28 0617) BP: 154/85 mmHg (10/28 0617) Pulse Rate: 66  (10/28 0617) Intake/Output from previous day: 10/27 0701 - 10/28 0700 In: 2529 [P.O.:60; I.V.:1239; TPN:1229] Out: 2125 [Urine:900; Emesis/NG output:1225] Intake/Output from this shift: Total I/O In: 1655.7 [I.V.:751; Other:1; TPN:903.7] Out: 725 [Urine:400; Emesis/NG output:325]  Labs:  [facility] 04/18/12 0523 04/17/12 0455 04/16/12 0505  WBC 7.1 5.8 4.7  HGB 12.1 12.2 12.2  HCT 36.2 36.4 37.3  PLT 129* 127* 124*  APTT -- -- --  INR -- -- --     Basename 04/18/12 0523 04/17/12 0455 04/16/12 0505  NA 137 136 136  K 4.2 3.8 3.7  CL 100 99 98  CO2 33* 34* 35*  GLUCOSE 101* 106* 96  BUN 16 7 3*  CREATININE 0.64 0.65 0.73  LABCREA -- -- --  CREAT24HRUR -- -- --  CALCIUM 9.2 9.0 9.1  MG 1.9 1.9 --  PHOS 3.7 3.3 --  PROT 5.6* 5.3* --  ALBUMIN 2.5* 2.4* --  AST 24 24 --  ALT 13 14 --  ALKPHOS 65 64 --  BILITOT 0.3 0.3 --  BILIDIR -- -- --  IBILI -- -- --  PREALBUMIN -- 11.5* --  TRIG -- 70 --  CHOLHDL -- -- --  CHOL -- 129 --  Corrected Ca=10.4 Estimated Creatinine Clearance: 36.5 ml/min (by C-G formula based on Cr of 0.64).    Basename 04/17/12 2359 04/17/12 1618 04/17/12 0801  GLUCAP 109* 162* 124*    Medical History: Past Medical History  Diagnosis Date  . Glaucoma (increased eye pressure)   . Ovarian cyst, right 2009    s/p BSO 2009: OVARIAN FIBROTHECOMA in multiple fluid filled cysts  4.5cm region  . Atrophy of left kidney     Chronic atrophy of the left kidney.  . Chronic constipation   . COPD (chronic obstructive pulmonary disease)     Hyperinflation and heavy smoking history strongly  . HTN (hypertension) 04/12/2012    . Benign breast cysts in female 04/12/2012    Seen on mammography 2009   . UTI (lower urinary tract infection)     History of chronic recurrent UTIs    Medications:  Scheduled:     . bisacodyl  10 mg Rectal Daily  . heparin  5,000 Units Subcutaneous Q8H  . lip balm  1 application Topical BID   Infusions:     . dextrose 5 % and 0.45 % NaCl with KCl 20 mEq/L 60 mL/hr at 04/17/12 1654  . dextrose 5 % and 0.45 % NaCl with KCl 20 mEq/L    . TPN (CLINIMIX) +/- additives 40 mL/hr at 04/17/12 1412  . TPN (CLINIMIX) +/- additives 60 mL/hr at 04/17/12 1743   Insulin Requirements in the past 24 hours:  No insulin ordered  Nutritional Goals:  RD recs (10/27): 1300-1500 kcal and 60-80 g protein per day Clinimix E 5/15  at a goal rate of 19ml/hr + IVFE 20% at 38ml/hr on MWF to provide: 84 g/day protein, 1398 Kcal/day avg.(1193 Kcal/day MWF, 1672 Kcal/day STTHS).  Current Nutrition:  NPO Clinimix-E 5/15 at 60 mL/hr  IVF: dextrose 5% and 0.45 % NaCl with KCl 20 mEq/L ordered @ 40 mL/hr, currently running at 60 mL/hr  Assessment: 75 YOF admitted 10/21 w/ abdominal pain, vomiting and not tolerating POs. Found to have SBO and tx non-sugically w/ NG decompression. Has been NPO since admission. TNA started 10/26.  Patient appears to be tolerating TNA.   Electrolytes: wnl except CO2 slightly elevated, c/w hx COPD  CBG: controlled (most are < 150 mg/dL)  LFTs wnl   TGs: wnl 47/82  Prealbumin: 11.5 at baseline 10/27   Plan:   Increase Clinimix E 5/15 to goal rate of 70 ml/hr at 1800 today.  Maintenance IVF rate per MD given high NG output.  TNA to contain IV fat emulsion, standard multivitamins and trace elements on MWF only due to ongoing shortage  No SSI for now.  Monitor CBG and start SSI if CBG repeatedly > 150  TNA labs every Monday/Thursday.  Pharmacy will follow up daily  Elie Goody, PharmD, BCPS Pager: [phone] 04/18/2012  7:04 AM

## 2012-04-18 NOTE — Progress Notes (Signed)
Patient ID: Amber Rowland, female   DOB: June 30, 1933, 78 y.o.   MRN: [id number]    Subjective: Pt wo complaints, still belching a lot, denies n/v, no flatus or BM yesterday  Objective: Vital signs in last 24 hours: Temp:  [98.2 F (36.8 C)-98.5 F (36.9 C)] 98.4 F (36.9 C) (10/28 0617) Pulse Rate:  [65-84] 66  (10/28 0617) Resp:  [20] 20  (10/28 0617) BP: (116-154)/(74-85) 154/85 mmHg (10/28 0617) SpO2:  [94 %-95 %] 95 % (10/28 0617) Last BM Date: 04/15/12  Intake/Output from previous day: 10/27 0701 - 10/28 0700 In: 2529 [P.O.:60; I.V.:1239; TPN:1229] Out: 2125 [Urine:900; Emesis/NG output:1225] Intake/Output this shift:    PE: Abd: less distended then last week, nontender to palpation, faint bs  Lab Results:   Basename 04/18/12 0523 04/17/12 0455  WBC 7.1 5.8  HGB 12.1 12.2  HCT 36.2 36.4  PLT 129* 127*   BMET  Basename 04/18/12 0523 04/17/12 0455  NA 137 136  K 4.2 3.8  CL 100 99  CO2 33* 34*  GLUCOSE 101* 106*  BUN 16 7  CREATININE 0.64 0.65  CALCIUM 9.2 9.0   PT/INR No results found for this basename: LABPROT:2,INR:2 in the last 72 hours CMP     Component Value Date/Time   NA 137 04/18/2012 0523   K 4.2 04/18/2012 0523   CL 100 04/18/2012 0523   CO2 33* 04/18/2012 0523   GLUCOSE 101* 04/18/2012 0523   BUN 16 04/18/2012 0523   CREATININE 0.64 04/18/2012 0523   CALCIUM 9.2 04/18/2012 0523   PROT 5.6* 04/18/2012 0523   ALBUMIN 2.5* 04/18/2012 0523   AST 24 04/18/2012 0523   ALT 13 04/18/2012 0523   ALKPHOS 65 04/18/2012 0523   BILITOT 0.3 04/18/2012 0523   GFRNONAA 85* 04/18/2012 0523   GFRAA >90 04/18/2012 0523   Lipase     Component Value Date/Time   LIPASE 19 11/05/2007 1455       Studies/Results: No results found.  Anti-infectives: Anti-infectives     Start     Dose/Rate Route Frequency Ordered Stop   04/12/12 0445   cefTRIAXone (ROCEPHIN) 1 g in dextrose 5 % 50 mL IVPB        1 g 100 mL/hr over 30 Minutes Intravenous  Once  04/12/12 0435 04/12/12 0615           Assessment/Plan  1.  SBO: Pt without sig flatus yesterday. NG output about the same.  Abd much more soft  --leave NGT in place, cont TPN  --repeat abd films today  --?try clamping if films look improved  --?SB follow through  --ambulate  --She currently has an albumin of 2.5 and would be at high risk for complications of a surgery. TPN started. I will continue the NG. 2.  Hypokalemia: likely due to NG output  --better today   LOS: 7 days    WHITE, [place] 04/18/2012 Pt seen and evaluated yesterday by myself.  Xray improved but still with some mild distension.

## 2012-04-19 DIAGNOSIS — E46 Unspecified protein-calorie malnutrition: Secondary | ICD-10-CM

## 2012-04-19 LAB — GLUCOSE, CAPILLARY: Glucose-Capillary: 113 mg/dL — ABNORMAL HIGH (ref 70–99)

## 2012-04-19 MED ORDER — CLINIMIX E/DEXTROSE (5/15) 5 % IV SOLN
INTRAVENOUS | Status: AC
  Administered 2012-04-19: 18:00:00 via INTRAVENOUS
  Filled 2012-04-19: qty 2000

## 2012-04-19 MED ORDER — PANTOPRAZOLE SODIUM 40 MG IV SOLR
40.0000 mg | Freq: Every day | INTRAVENOUS | Status: DC
  Administered 2012-04-19 – 2012-05-06 (×17): 40 mg via INTRAVENOUS
  Filled 2012-04-19 (×19): qty 40

## 2012-04-19 NOTE — Progress Notes (Signed)
Occupational Therapy Treatment Patient Details Name: Amber Rowland MRN: [id number] DOB: [birth year] Today's Date: 04/19/2012 Time: 6387-5643 OT Time Calculation (min): 15 min  OT Assessment / Plan / Recommendation Comments on Treatment Session Discharge destination still  not determined at this time. Pt is mobilizing well despite some minor safety concerns.    Follow Up Recommendations  No OT follow up;Supervision - Intermittent    Barriers to Discharge       Equipment Recommendations       Recommendations for Other Services    Frequency Min 2X/week   Plan      Precautions / Restrictions Precautions Precautions: Fall Precaution Comments: NG tube   Pertinent Vitals/Pain Pt c/o abdominal pain which she did not rate. Repositioned for comfort.    ADL  Grooming: Performed;Brushing hair Where Assessed - Grooming: Unsupported sitting Upper Body Dressing: Performed;Set up Where Assessed - Upper Body Dressing: Unsupported standing Lower Body Dressing: Performed;Set up Where Assessed - Lower Body Dressing: Unsupported sitting Toilet Transfer: Simulated;Supervision/safety Toilet Transfer Method: Sit to stand Toilet Transfer Equipment: Other (comment) (recliner) ADL Comments: Pt initiates RBs when needed.    OT Diagnosis:    OT Problem List:   OT Treatment Interventions:     OT Goals ADL Goals ADL Goal: Grooming - Progress: Progressing toward goals ADL Goal: Toilet Transfer - Progress: Progressing toward goals ADL Goal: Additional Goal #1 - Progress: Progressing toward goals  Visit Information  Last OT Received On: 04/19/12 Assistance Needed: +1    Subjective Data  Subjective: I haven't been up at all today.   Prior Functioning       Cognition  Overall Cognitive Status: No family/caregiver present to determine baseline cognitive functioning Area of Impairment: Safety/judgement;Awareness of errors Arousal/Alertness: Awake/alert Orientation Level: Appears intact  for tasks assessed Behavior During Session: [facility] for tasks performed Safety/Judgement: Decreased awareness of safety precautions;Decreased safety judgement for tasks assessed;Impulsive Awareness of Errors: Assistance required to identify errors made;Assistance required to correct errors made    Mobility  Shoulder Instructions Bed Mobility Supine to Sit: 6: Modified independent (Device/Increase time) Transfers Sit to Stand: 5: Supervision;From bed;With upper extremity assist Stand to Sit: 5: Supervision;With upper extremity assist;To chair/3-in-1;With armrests       Exercises      Balance     End of Session OT - End of Session Activity Tolerance: Patient tolerated treatment well Patient left: in chair;with call bell/phone within reach  GO     Kiylah Loyer A OTR/L [phone] 04/19/2012, 3:30 PM

## 2012-04-19 NOTE — Progress Notes (Signed)
PARENTERAL NUTRITION CONSULT NOTE   Pharmacy Consult for TNA Indication: SBO  No Known Allergies  Patient Measurements: Height: 4\' 9"  (144.8 cm) Weight: 85 lb (38.556 kg) IBW/kg (Calculated) : 38.6    Vital Signs: Temp: 98.2 F (36.8 C) (10/29 0624) Temp src: Oral (10/29 0624) BP: 136/77 mmHg (10/29 0624) Pulse Rate: 73  (10/29 0624) Intake/Output from previous day: 10/28 0701 - 10/29 0700 In: 2409.5 [I.V.:692; TPN:1717.5] Out: 3310 [Urine:1410; Emesis/NG output:1900] Intake/Output from this shift: Total I/O In: 1494.5 [I.V.:472; TPN:1022.5] Out: 1860 [Urine:760; Emesis/NG output:1100]  Labs:  [facility] 04/18/12 0523 04/17/12 0455  WBC 7.1 5.8  HGB 12.1 12.2  HCT 36.2 36.4  PLT 129* 127*  APTT -- --  INR -- --     [facility] 04/18/12 0523 04/17/12 0455  NA 137 136  K 4.2 3.8  CL 100 99  CO2 33* 34*  GLUCOSE 101* 106*  BUN 16 7  CREATININE 0.64 0.65  LABCREA -- --  CREAT24HRUR -- --  CALCIUM 9.2 9.0  MG 1.9 1.9  PHOS 3.7 3.3  PROT 5.6* 5.3*  ALBUMIN 2.5* 2.4*  AST 24 24  ALT 13 14  ALKPHOS 65 64  BILITOT 0.3 0.3  BILIDIR -- --  IBILI -- --  PREALBUMIN 12.8* 11.5*  TRIG 66 70  CHOLHDL -- --  CHOL 127 129  Corrected Ca=10.4 Estimated Creatinine Clearance: 36.5 ml/min (by C-G formula based on Cr of 0.64).    Basename 04/18/12 2342 04/18/12 0746 04/17/12 2359  GLUCAP 107* 103* 109*    Medical History: Past Medical History  Diagnosis Date  . Glaucoma (increased eye pressure)   . Ovarian cyst, right 2009    s/p BSO 2009: OVARIAN FIBROTHECOMA in multiple fluid filled cysts  4.5cm region  . Atrophy of left kidney     Chronic atrophy of the left kidney.  . Chronic constipation   . COPD (chronic obstructive pulmonary disease)     Hyperinflation and heavy smoking history strongly  . HTN (hypertension) 04/12/2012  . Benign breast cysts in female 04/12/2012    Seen on mammography 2009   . UTI (lower urinary tract infection)     History of chronic  recurrent UTIs    Medications:  Scheduled:     . bisacodyl  10 mg Rectal Daily  . heparin  5,000 Units Subcutaneous Q8H  . lip balm  1 application Topical BID   Infusions:     . dextrose 5 % and 0.45 % NaCl with KCl 20 mEq/L 40 mL/hr at 04/19/12 0229  . TPN (CLINIMIX) +/- additives 70 mL/hr at 04/18/12 1801   And  . fat emulsion 250 mL (04/18/12 1802)  . TPN (CLINIMIX) +/- additives 60 mL/hr at 04/17/12 1743   Insulin Requirements in the past 24 hours:  No insulin ordered  Nutritional Goals:  RD recs (10/27): 1300-1500 kcal and 60-80 g protein per day Clinimix E 5/15  at a goal rate of 57ml/hr + IVFE 20% at 46ml/hr on MWF to provide: 84 g/day protein, 1398 Kcal/day avg.(1193 Kcal/day MWF, 1672 Kcal/day STTHS).  Current Nutrition:  NPO Clinimix-E 5/15 at 70 mL/hr Fat Emulsion 20% at 10 mL/hr MWF  IVF: dextrose 5% and 0.45 % NaCl with KCl 20 mEq/L@ 40 mL/hr  Assessment: 78 YOF admitted 10/21 w/ abdominal pain, vomiting and not tolerating POs. Found to have SBO and tx non-sugically w/ NG decompression. Has been NPO since admission. TNA started 10/26.  Patient appears to be tolerating TNA.   Electrolytes (  10/28): wnl except CO2 slightly elevated, c/w hx COPD  CBG: controlled (most are < 150 mg/dL)  LFTs wnl 14/78  TGs: wnl 10/28  Prealbumin: 11.5 (10/27), 12.8 (10/28)   Plan:  1. Continue Clinimix-E 5/15 at goal rate of 70 mL/hr 2. Fat Emulsion 20% at 10 mL/hr on MWF only (due to national shortage) 3. Multivitamins and trace elements on MWF only (due to national shortage) 4. Maintenance IVF rate per MD given high NG output 5. Full TNA labs on Mondays and Thursdays 6. BMet tomorrow.  Elie Goody, PharmD, BCPS Pager: [phone] 04/19/2012  6:52 AM

## 2012-04-19 NOTE — Progress Notes (Signed)
Subjective: Still distended, not complaining, occasional flatus, she says she had a small bm  Objective: Vital signs in last 24 hours: Temp:  [98.2 F (36.8 C)-98.3 F (36.8 C)] 98.2 F (36.8 C) (10/29 0624) Pulse Rate:  [66-73] 73  (10/29 0624) Resp:  [16-18] 16  (10/29 0624) BP: (115-136)/(70-77) 136/77 mmHg (10/29 0624) SpO2:  [96 %-97 %] 97 % (10/29 0624) Last BM Date: 04/15/12  1900 from NG yesterday, 1 BM recorded. Diet: NPO except ice, afebrile, VSS, started on TNA  Intake/Output from previous day: 10/28 0701 - 10/29 0700 In: 2409.5 [I.V.:692; TPN:1717.5] Out: 3310 [Urine:1410; Emesis/NG output:1900] Intake/Output this shift:    General appearance: alert, cooperative and no distress Resp: clear to auscultation bilaterally GI: distended, no bowel sounds, she's not really tender.  Lab Results:   Basename 04/18/12 0523 04/17/12 0455  WBC 7.1 5.8  HGB 12.1 12.2  HCT 36.2 36.4  PLT 129* 127*    BMET  Basename 04/18/12 0523 04/17/12 0455  NA 137 136  K 4.2 3.8  CL 100 99  CO2 33* 34*  GLUCOSE 101* 106*  BUN 16 7  CREATININE 0.64 0.65  CALCIUM 9.2 9.0   PT/INR No results found for this basename: LABPROT:2,INR:2 in the last 72 hours   Lab 04/18/12 0523 04/17/12 0455 04/14/12 0500  AST 24 24 19   ALT 13 14 13   ALKPHOS 65 64 69  BILITOT 0.3 0.3 0.3  PROT 5.6* 5.3* 5.3*  ALBUMIN 2.5* 2.4* 2.5*     Lipase     Component Value Date/Time   LIPASE 19 11/05/2007 1455     Studies/Results: Dg Abd Portable 1v  04/18/2012  *RADIOLOGY REPORT*  Clinical Data: Small bowel obstruction  PORTABLE ABDOMEN - 1 VIEW  Comparison: 04/16/2012, 04/12/2012  Findings: Nasogastric catheter is again noted deep within the stomach.  A left renal stone is again seen.  Mild small bowel dilatation is seen.  The overall appearance has improved significantly from prior exam.  No free air is noted.  IMPRESSION: Slight improvement of small bowel dilatation when compared with prior  exam.   Original Report Authenticated By: Phillips Odor, M.D.     Medications:    . bisacodyl  10 mg Rectal Daily  . heparin  5,000 Units Subcutaneous Q8H  . lip balm  1 application Topical BID   Prior to Admission medications   Medication Sig Start Date End Date Taking? Authorizing Provider  aspirin 325 MG tablet Take 650 mg by mouth daily. Pain   Yes Historical Provider, MD     Assessment/Plan Dilated abdomen suspicious for ileus in the setting of chronic constipation vs. bowel obstruction. Laparotomy with BSO Malnutrition UTI (lower urinary tract infection)  Chronic constipation  COPD (chronic obstructive pulmonary disease)  Tobacco abuse  SBO (small bowel obstruction)  HTN (hypertension)  Benign breast cysts in female  Anxiety  Lung nodules, ?incidental   Plan:  Continue NG, and TNA.  She does not look like she has improved.  Dr. Biagio Quint will discuss timing of surgery if she does not open soon.  Family notes she eats little at home and smokes 1/2 PPD for the last 50 years.  Getting daily dulcolax, I will stop that.  CT on admit shows large bowel decompressed.    LOS: 8 days    JENNINGS,WILLARD 04/19/2012  Daughter and son in room with patient.  Not any worse or better.  Now 7 days in the hospital, on TPN, with little progress.  History of bilateral salpingo-oophorectomy 01/20/2008 by Dr. Glynda Jaeger, but scar is hard to see.  She also has a small epigastric hernia, unrelated to current problems.  Continue nutritional support and now going on 1 week medical treatment.  Ovidio Kin, MD, [facility] Pager: [phone] Office phone:  [phone]

## 2012-04-19 NOTE — Progress Notes (Signed)
Discussed at long length of stay meeting  

## 2012-04-20 LAB — BASIC METABOLIC PANEL
CO2: 31 mEq/L (ref 19–32)
Calcium: 9.2 mg/dL (ref 8.4–10.5)
Glucose, Bld: 94 mg/dL (ref 70–99)
Potassium: 4.3 mEq/L (ref 3.5–5.1)
Sodium: 133 mEq/L — ABNORMAL LOW (ref 135–145)

## 2012-04-20 LAB — GLUCOSE, CAPILLARY
Glucose-Capillary: 109 mg/dL — ABNORMAL HIGH (ref 70–99)
Glucose-Capillary: 112 mg/dL — ABNORMAL HIGH (ref 70–99)
Glucose-Capillary: 113 mg/dL — ABNORMAL HIGH (ref 70–99)
Glucose-Capillary: 117 mg/dL — ABNORMAL HIGH (ref 70–99)

## 2012-04-20 MED ORDER — FAT EMULSION 20 % IV EMUL
250.0000 mL | INTRAVENOUS | Status: AC
  Administered 2012-04-20: 250 mL via INTRAVENOUS
  Filled 2012-04-20: qty 250

## 2012-04-20 MED ORDER — TRACE MINERALS CR-CU-F-FE-I-MN-MO-SE-ZN IV SOLN
INTRAVENOUS | Status: AC
  Administered 2012-04-20: 18:00:00 via INTRAVENOUS
  Filled 2012-04-20: qty 2000

## 2012-04-20 NOTE — Progress Notes (Signed)
PT NOTE: PT services d/c at this time.  Pt is ambulating in hallway with nursing and demonstrating no difficulty with mobility.  RN in agreement.  Please reorder as needed should pt condition change.

## 2012-04-20 NOTE — Progress Notes (Signed)
  Subjective: Feeling stronger.  No flatus or BM  Objective: Vital signs in last 24 hours: Temp:  [97.9 F (36.6 C)-98.2 F (36.8 C)] 98.2 F (36.8 C) (10/30 0617) Pulse Rate:  [61-75] 61  (10/30 0617) Resp:  [14-16] 14  (10/30 0617) BP: (101-142)/(55-83) 101/55 mmHg (10/30 0617) SpO2:  [95 %-97 %] 95 % (10/30 0617) Last BM Date: 04/15/12  Intake/Output from previous day: 10/29 0701 - 10/30 0700 In: 2829.8 [I.V.:918.7; NG/GT:180; IV Piggyback:10; TPN:1721.2] Out: 2500 [Urine:1150; Emesis/NG output:1350] Intake/Output this shift:    General appearance: alert, cooperative and no distress Resp: clear to auscultation bilaterally Cardio: normal rate GI: soft, NT, mild distension, no peritoneal signs.  Bilious NG output  Lab Results:   [facility] 04/18/12 0523  WBC 7.1  HGB 12.1  HCT 36.2  PLT 129*   BMET  Basename 04/20/12 0550 04/18/12 0523  NA 133* 137  K 4.3 4.2  CL 97 100  CO2 31 33*  GLUCOSE 94 101*  BUN 26* 16  CREATININE 0.65 0.64  CALCIUM 9.2 9.2   PT/INR No results found for this basename: LABPROT:2,INR:2 in the last 72 hours ABG No results found for this basename: PHART:2,PCO2:2,PO2:2,HCO3:2 in the last 72 hours  Studies/Results: Dg Abd Portable 1v  04/18/2012  *RADIOLOGY REPORT*  Clinical Data: Small bowel obstruction  PORTABLE ABDOMEN - 1 VIEW  Comparison: 04/16/2012, 04/12/2012  Findings: Nasogastric catheter is again noted deep within the stomach.  A left renal stone is again seen.  Mild small bowel dilatation is seen.  The overall appearance has improved significantly from prior exam.  No free air is noted.  IMPRESSION: Slight improvement of small bowel dilatation when compared with prior exam.   Original Report Authenticated By: Phillips Odor, M.D.     Anti-infectives: Anti-infectives     Start     Dose/Rate Route Frequency Ordered Stop   04/12/12 0445   cefTRIAXone (ROCEPHIN) 1 g in dextrose 5 % 50 mL IVPB        1 g 100 mL/hr over 30  Minutes Intravenous  Once 04/12/12 0435 04/12/12 0615          Assessment/Plan: s/p Procedure(s) (LRB) with comments: EXPLORATORY LAPAROTOMY (N/A) I discussed the situation with the patient and her daughter (per phone) and I explained that she has not shown any improvement in her symptoms over the last week and is still mildly distended and though no leukocytosis or fevers or increased pain, given her lack of improvement I have recommended ex. Lap and LOA. We discussed the pros and cons of the procedure and of waiting which has its risks as well. I have her on the OR schedule for today but the patient and her daughter want to hold off and reevaluate again tomorrow.  Unless she shows improvement, then I will again recommend surgery.  In the meantime, since they do not want surgery and her NG tube could be in too far, I will trial clamping this and see how she does today.  If any nausea or any increase in distension, will place to suction again.  But since she says that she normally is slightly distended, this could be her baseline as well.  LOS: 9 days    Lodema Pilot DAVID 04/20/2012

## 2012-04-20 NOTE — Progress Notes (Signed)
Pt's NG tube clamped this morning by Dr. Biagio Quint to see how pt responded. This afternoon pt states "I feel like I'm going to bust." Pt still has had no BM, passing little flatus. Paged on-call doctor. Dr. Gwinda Passe responded to call with orders to hook the patient back up to low-intermittent suction for the night to relieve pt discomfort. Will continue to monitor pt.

## 2012-04-20 NOTE — Progress Notes (Signed)
PARENTERAL NUTRITION CONSULT NOTE   Pharmacy Consult for TNA Indication: SBO  No Known Allergies  Patient Measurements: Height: 4\' 9"  (144.8 cm) Weight: 85 lb (38.556 kg) IBW/kg (Calculated) : 38.6    Vital Signs: Temp: 98.2 F (36.8 C) (10/30 0617) Temp src: Oral (10/30 0617) BP: 101/55 mmHg (10/30 0617) Pulse Rate: 61  (10/30 0617) Intake/Output from previous day: 10/29 0701 - 10/30 0700 In: 2829.8 [I.V.:918.7; NG/GT:180; IV Piggyback:10; TPN:1721.2] Out: 2500 [Urine:1150; Emesis/NG output:1350] Intake/Output from this shift:    Labs:  [facility] 04/18/12 0523  WBC 7.1  HGB 12.1  HCT 36.2  PLT 129*  APTT --  INR --     Basename 04/20/12 0550 04/18/12 0523  NA 133* 137  K 4.3 4.2  CL 97 100  CO2 31 33*  GLUCOSE 94 101*  BUN 26* 16  CREATININE 0.65 0.64  LABCREA -- --  CREAT24HRUR -- --  CALCIUM 9.2 9.2  MG -- 1.9  PHOS -- 3.7  PROT -- 5.6*  ALBUMIN -- 2.5*  AST -- 24  ALT -- 13  ALKPHOS -- 65  BILITOT -- 0.3  BILIDIR -- --  IBILI -- --  PREALBUMIN -- 12.8*  TRIG -- 66  CHOLHDL -- --  CHOL -- 127  Corrected Ca=10.4 (10/28) Estimated Creatinine Clearance: 36.5 ml/min (by C-G formula based on Cr of 0.65).   Medical History: Past Medical History  Diagnosis Date  . Glaucoma (increased eye pressure)   . Ovarian cyst, right 2009    s/p BSO 2009: OVARIAN FIBROTHECOMA in multiple fluid filled cysts  4.5cm region  . Atrophy of left kidney     Chronic atrophy of the left kidney.  . Chronic constipation   . COPD (chronic obstructive pulmonary disease)     Hyperinflation and heavy smoking history strongly  . HTN (hypertension) 04/12/2012  . Benign breast cysts in female 04/12/2012    Seen on mammography 2009   . UTI (lower urinary tract infection)     History of chronic recurrent UTIs    Medications:  Scheduled:     . bisacodyl  10 mg Rectal Daily  . heparin  5,000 Units Subcutaneous Q8H  . lip balm  1 application Topical BID  .  pantoprazole (PROTONIX) IV  40 mg Intravenous QHS   Infusions:     . dextrose 5 % and 0.45 % NaCl with KCl 20 mEq/L 40 mL/hr at 04/20/12 0238  . TPN (CLINIMIX) +/- additives 70 mL/hr at 04/18/12 1801   And  . fat emulsion 250 mL (04/18/12 1802)  . TPN (CLINIMIX) +/- additives 70 mL/hr at 04/19/12 1809   Insulin Requirements in the past 24 hours:  CBGs: 123, 113, 113 No insulin ordered  Nutritional Goals:  RD recs (10/27): 1300-1500 kcal and 60-80 g protein per day Clinimix E 5/15  at a goal rate of 41ml/hr + IVFE 20% at 76ml/hr on MWF to provide: 84 g/day protein, 1398 Kcal/day avg.(1193 Kcal/day MWF, 1672 Kcal/day STTHS).  Current Nutrition:  NPO Clinimix-E 5/15 at 70 mL/hr Fat Emulsion 20% at 10 mL/hr MWF  IVF: dextrose 5% and 0.45 % NaCl with KCl 20 mEq/L@ 40 mL/hr  Assessment: 78 YOF admitted 10/21 w/ abdominal pain, vomiting and not tolerating POs. Found to have SBO and tx non-sugically w/ NG decompression, may consider surgery if necessary. Has been NPO since admission. TNA started 10/26.  Patient appears to be tolerating TNA.   Electrolytes: wnl except Na slightly low (unable to adjust in TNA)  CBG: controlled (< 150 mg/dL)  LFTs wnl 46/96  TGs: wnl 10/28  Prealbumin: 11.5 (10/27), 12.8 (10/28)   Plan:  1. Continue Clinimix-E 5/15 at goal rate of 70 mL/hr 2. Fat Emulsion 20% at 10 mL/hr on MWF only (due to national shortage) 3. Multivitamins and trace elements on MWF only (due to national shortage) 4. Maintenance IVF rate per MD given high NG output 5. Full TNA labs on Mondays and Thursdays   Loralee Pacas, PharmD, BCPS Pager: [phone] 04/20/2012  7:24 AM

## 2012-04-21 ENCOUNTER — Encounter (HOSPITAL_COMMUNITY): Payer: Self-pay | Admitting: Anesthesiology

## 2012-04-21 ENCOUNTER — Encounter (HOSPITAL_COMMUNITY): Admission: EM | Disposition: A | Discharge status: Home / Self Care | Payer: Self-pay | Source: Home / Self Care

## 2012-04-21 LAB — PHOSPHORUS: Phosphorus: 3.6 mg/dL (ref 2.3–4.6)

## 2012-04-21 LAB — COMPREHENSIVE METABOLIC PANEL
AST: 21 U/L (ref 0–37)
CO2: 30 mEq/L (ref 19–32)
Calcium: 9.2 mg/dL (ref 8.4–10.5)
Creatinine, Ser: 0.68 mg/dL (ref 0.50–1.10)
GFR calc Af Amer: >90 mL/min (ref >90)
GFR calc non Af Amer: 83 mL/min — ABNORMAL LOW (ref >90)
Glucose, Bld: 94 mg/dL (ref 70–99)
Sodium: 133 mEq/L — ABNORMAL LOW (ref 135–145)
Total Protein: 5.9 g/dL — ABNORMAL LOW (ref 6.0–8.3)

## 2012-04-21 LAB — GLUCOSE, CAPILLARY
Glucose-Capillary: 101 mg/dL — ABNORMAL HIGH (ref 70–99)
Glucose-Capillary: 102 mg/dL — ABNORMAL HIGH (ref 70–99)
Glucose-Capillary: 99 mg/dL (ref 70–99)

## 2012-04-21 SURGERY — LAPAROTOMY, EXPLORATORY
Anesthesia: General

## 2012-04-21 MED ORDER — CLINIMIX E/DEXTROSE (5/15) 5 % IV SOLN
INTRAVENOUS | Status: AC
  Administered 2012-04-21: 18:00:00 via INTRAVENOUS
  Filled 2012-04-21: qty 2000

## 2012-04-21 NOTE — Progress Notes (Signed)
  Subjective: Did well with NG clamped yesterday but placed on suction again last night.  She said that she got sick on too many throat lozenges and tube replaced to suction with only out.  Objective: Vital signs in last 24 hours: Temp:  [97.7 F (36.5 C)-98.6 F (37 C)] 98.6 F (37 C) (10/31 0637) Pulse Rate:  [58-74] 74  (10/31 0637) Resp:  [16] 16  (10/31 0637) BP: (105-124)/(45-72) 106/66 mmHg (10/31 0637) SpO2:  [96 %-98 %] 96 % (10/31 0637) Last BM Date: 04/15/12  Intake/Output from previous day: 10/30 0701 - 10/31 0700 In: 2904.2 [I.V.:929.3; RUE:4540.9] Out: 2100 [Urine:1850; Emesis/NG output:250] Intake/Output this shift:    General appearance: alert, cooperative and no distress GI: soft, non-tender; bowel sounds normal; no masses,  no organomegaly and not much distension and no pain  Lab Results:  No results found for this basename: WBC:2,HGB:2,HCT:2,PLT:2 in the last 72 hours BMET  [facility] 04/21/12 0550 04/20/12 0550  NA 133* 133*  K 4.7 4.3  CL 98 97  CO2 30 31  GLUCOSE 94 94  BUN 26* 26*  CREATININE 0.68 0.65  CALCIUM 9.2 9.2   PT/INR No results found for this basename: LABPROT:2,INR:2 in the last 72 hours ABG No results found for this basename: PHART:2,PCO2:2,PO2:2,HCO3:2 in the last 72 hours  Studies/Results: No results found.  Anti-infectives: Anti-infectives     Start     Dose/Rate Route Frequency Ordered Stop   04/12/12 0445   cefTRIAXone (ROCEPHIN) 1 g in dextrose 5 % 50 mL IVPB        1 g 100 mL/hr over 30 Minutes Intravenous  Once 04/12/12 0435 04/12/12 0615          Assessment/Plan: s/p Procedure(s) (LRB) with comments: EXPLORATORY LAPAROTOMY (N/A) I again recommended surgery given her lack of improvement and ability to tolerate NG clamped.  She still is resistant to surgery and I really appreciate her caution because she will be high risk at age 78 and as frail as she is.  However, she has been here more than a week without  any significant improvement.  She says that she has been passing gas.  She wants to try a diet and if she doesn't tolerate this then she will accept surgery.  We will try clamping the tube again and try clears.  If not tolerated, then I will again recommend surgery.  I left a message with the daughter but was not able to reach her this morning.  LOS: 10 days    Lodema Pilot DAVID 04/21/2012

## 2012-04-21 NOTE — Progress Notes (Signed)
Occupational Therapy Note Note plan is for pt to go to daughter's home and she will be a caregiver at discharge. Spoke to pt and she states she is having no difficulty with getting up to the bathroom and performing ADL with nursing present. See from latest OT notes, pt is supervision/setup level with ADL. Goals were set for modified independence if pt were to return home alone. Will discharge OT services as it appears at current time, pt will have the supervision at discharge. If functional mobility status changes or discharge plan/needs change, please reorder OT. Judithann Sauger OTR/L [phone] 04/21/2012

## 2012-04-21 NOTE — Progress Notes (Signed)
PARENTERAL NUTRITION CONSULT NOTE   Pharmacy Consult for TNA Indication: SBO  No Known Allergies  Patient Measurements: Height: 4\' 9"  (144.8 cm) Weight: 85 lb (38.556 kg) IBW/kg (Calculated) : 38.6    Vital Signs: Temp: 98.6 F (37 C) (10/31 0951) Temp src: Oral (10/31 0951) BP: 145/116 mmHg (10/31 0951) Pulse Rate: 70  (10/31 0951) Intake/Output from previous day: 10/30 0701 - 10/31 0700 In: 2904.2 [I.V.:929.3; ZOX:0960.4] Out: 2100 [Urine:1850; Emesis/NG output:250] Intake/Output from this shift:    Labs: No results found for this basename: WBC:3,HGB:3,HCT:3,PLT:3,APTT:3,INR:3 in the last 72 hours   Basename 04/21/12 0550 04/20/12 0550  NA 133* 133*  K 4.7 4.3  CL 98 97  CO2 30 31  GLUCOSE 94 94  BUN 26* 26*  CREATININE 0.68 0.65  LABCREA -- --  CREAT24HRUR -- --  CALCIUM 9.2 9.2  MG 2.1 --  PHOS 3.6 --  PROT 5.9* --  ALBUMIN 2.6* --  AST 21 --  ALT 13 --  ALKPHOS 73 --  BILITOT 0.2* --  BILIDIR -- --  IBILI -- --  PREALBUMIN -- --  TRIG -- --  CHOLHDL -- --  CHOL -- --  Corrected Ca=10.3 Estimated Creatinine Clearance: 36.5 ml/min (by C-G formula based on Cr of 0.68).   Medical History: Past Medical History  Diagnosis Date  . Glaucoma (increased eye pressure)   . Ovarian cyst, right 2009    s/p BSO 2009: OVARIAN FIBROTHECOMA in multiple fluid filled cysts  4.5cm region  . Atrophy of left kidney     Chronic atrophy of the left kidney.  . Chronic constipation   . COPD (chronic obstructive pulmonary disease)     Hyperinflation and heavy smoking history strongly  . HTN (hypertension) 04/12/2012  . Benign breast cysts in female 04/12/2012    Seen on mammography 2009   . UTI (lower urinary tract infection)     History of chronic recurrent UTIs    Medications:  Scheduled:     . bisacodyl  10 mg Rectal Daily  . heparin  5,000 Units Subcutaneous Q8H  . lip balm  1 application Topical BID  . pantoprazole (PROTONIX) IV  40 mg Intravenous QHS     Infusions:     . dextrose 5 % and 0.45 % NaCl with KCl 20 mEq/L 40 mL/hr at 04/20/12 0238  . fat emulsion 250 mL (04/20/12 1754)  . TPN (CLINIMIX) +/- additives 70 mL/hr at 04/19/12 1809  . TPN (CLINIMIX) +/- additives 70 mL/hr at 04/20/12 1754   Insulin Requirements in the past 24 hours:  CBGs: 101-125 No insulin ordered  Nutritional Goals:  RD recs (10/27): 1300-1500 kcal and 60-80 g protein per day Clinimix E 5/15  at a goal rate of 69ml/hr + IVFE 20% at 36ml/hr on MWF to provide: 84 g/day protein, 1398 Kcal/day avg.(1193 Kcal/day MWF, 1672 Kcal/day STTHS).  Current Nutrition:  Trying CL diet starting 10/31 Clinimix-E 5/15 at 70 mL/hr Fat Emulsion 20% at 10 mL/hr MWF  IVF: dextrose 5% and 0.45 % NaCl with KCl 20 mEq/L@ 40 mL/hr  Assessment: Amber Rowland admitted 10/21 w/ abdominal pain, vomiting and not tolerating POs. Found to have SBO and tx non-sugically w/ NG decompression, may consider surgery if necessary. Has been NPO since admission. TNA started 10/26.  Patient appears to be tolerating TNA.   Electrolytes: wnl except Na slightly low (unable to adjust in TNA)  CBG: controlled (< 150 mg/dL)  LFTs wnl 54/09  TGs: wnl 10/28  Prealbumin:  11.5 (10/27), 12.8 (10/28)   Plan:  1. Continue Clinimix-E 5/15 at goal rate of 70 mL/hr 2. Fat Emulsion 20% at 10 mL/hr on MWF only (due to national shortage) 3. Multivitamins and trace elements on MWF only (due to national shortage) 4. Maintenance IVF rate per MD given high NG output 5. Full TNA labs on Mondays and Thursdays   Hessie Knows, PharmD, BCPS Pager [phone] 04/21/2012 11:02 AM

## 2012-04-22 LAB — GLUCOSE, CAPILLARY
Glucose-Capillary: 118 mg/dL — ABNORMAL HIGH (ref 70–99)
Glucose-Capillary: 118 mg/dL — ABNORMAL HIGH (ref 70–99)

## 2012-04-22 MED ORDER — FAT EMULSION 20 % IV EMUL
250.0000 mL | INTRAVENOUS | Status: AC
  Administered 2012-04-22: 250 mL via INTRAVENOUS
  Filled 2012-04-22: qty 250

## 2012-04-22 MED ORDER — TRACE MINERALS CR-CU-F-FE-I-MN-MO-SE-ZN IV SOLN
INTRAVENOUS | Status: AC
  Administered 2012-04-22: 17:00:00 via INTRAVENOUS
  Filled 2012-04-22: qty 2000

## 2012-04-22 NOTE — Progress Notes (Signed)
PARENTERAL NUTRITION CONSULT NOTE   Pharmacy Consult for TNA Indication: SBO  No Known Allergies  Patient Measurements: Height: 4\' 9"  (144.8 cm) Weight: 85 lb (38.556 kg) IBW/kg (Calculated) : 38.6    Vital Signs: Temp: 97.9 F (36.6 C) (11/01 0508) Temp src: Oral (11/01 0508) BP: 106/69 mmHg (11/01 0508) Pulse Rate: 68  (11/01 0508) Intake/Output from previous day: 10/31 0701 - 11/01 0700 In: 2988.2 [I.V.:1039.3; ZOX:0960.4] Out: 3301 [Urine:3050; Emesis/NG output:250; Stool:1] Intake/Output from this shift:    Labs: No results found for this basename: WBC:3,HGB:3,HCT:3,PLT:3,APTT:3,INR:3 in the last 72 hours   Basename 04/21/12 0550 04/20/12 0550  NA 133* 133*  K 4.7 4.3  CL 98 97  CO2 30 31  GLUCOSE 94 94  BUN 26* 26*  CREATININE 0.68 0.65  LABCREA -- --  CREAT24HRUR -- --  CALCIUM 9.2 9.2  MG 2.1 --  PHOS 3.6 --  PROT 5.9* --  ALBUMIN 2.6* --  AST 21 --  ALT 13 --  ALKPHOS 73 --  BILITOT 0.2* --  BILIDIR -- --  IBILI -- --  PREALBUMIN -- --  TRIG -- --  CHOLHDL -- --  CHOL -- --  Corrected Ca=10.3 Estimated Creatinine Clearance: 36.5 ml/min (by C-G formula based on Cr of 0.68).   Medical History: Past Medical History  Diagnosis Date  . Glaucoma (increased eye pressure)   . Ovarian cyst, right 2009    s/p BSO 2009: OVARIAN FIBROTHECOMA in multiple fluid filled cysts  4.5cm region  . Atrophy of left kidney     Chronic atrophy of the left kidney.  . Chronic constipation   . COPD (chronic obstructive pulmonary disease)     Hyperinflation and heavy smoking history strongly  . HTN (hypertension) 04/12/2012  . Benign breast cysts in female 04/12/2012    Seen on mammography 2009   . UTI (lower urinary tract infection)     History of chronic recurrent UTIs    Medications:  Scheduled:     . bisacodyl  10 mg Rectal Daily  . heparin  5,000 Units Subcutaneous Q8H  . lip balm  1 application Topical BID  . pantoprazole (PROTONIX) IV  40 mg  Intravenous QHS   Infusions:     . dextrose 5 % and 0.45 % NaCl with KCl 20 mEq/L 40 mL/hr at 04/21/12 1316  . fat emulsion 250 mL (04/20/12 1754)  . TPN (CLINIMIX) +/- additives 70 mL/hr at 04/20/12 1754  . TPN (CLINIMIX) +/- additives 70 mL/hr at 04/21/12 1749   Insulin Requirements in the past 24 hours:  CBGs: 101, 99, 106, 118 No insulin ordered  Nutritional Goals:  RD recs (10/27): 1300-1500 kcal and 60-80 g protein per day Clinimix E 5/15  at a goal rate of 9ml/hr + IVFE 20% at 56ml/hr on MWF to provide: 84 g/day protein, 1398 Kcal/day avg.(1193 Kcal/day MWF, 1672 Kcal/day STTHS).  Current Nutrition:  CL diet starting 10/31, NGT to suction Clinimix-E 5/15 at 70 mL/hr Fat Emulsion 20% at 10 mL/hr MWF  IVF: dextrose 5% and 0.45 % NaCl with KCl 20 mEq/L@ 40 mL/hr  Assessment: 78 YOF admitted 10/21 w/ abdominal pain, vomiting and not tolerating POs. Found to have SBO and tx non-sugically w/ NG decompression, failing to progress . Has been NPO since admission. TNA started 10/26.    Patient appears to be tolerating TNA. She still requires NGT and declines surgery at this time.   Electrolytes: wnl except Na slightly low (unable to adjust in TNA)  CBG: controlled (< 150 mg/dL)  LFTs wnl 16/10  TGs: wnl 10/28  Prealbumin: 11.5 (10/27), 12.8 (10/28)   Plan:  1. Continue Clinimix-E 5/15 at goal rate of 70 mL/hr 2. Fat Emulsion 20% at 10 mL/hr on MWF only (due to national shortage) 3. Multivitamins and trace elements on MWF only (due to national shortage) 4. Maintenance IVF rate per MD  5. Full TNA labs on Mondays and Thursdays   Loralee Pacas, PharmD, BCPS Pager: [phone] 04/22/2012 9:23 AM

## 2012-04-22 NOTE — Progress Notes (Signed)
1 Day Post-Op  Subjective: Did fine with coffee but last night she requested that the tube be replaced to suction because of discomfort.  No nausea or vomiting.  She says that she is passing flatus but no BM, she had out after hooking the tube to suction  Objective: Vital signs in last 24 hours: Temp:  [97.9 F (36.6 C)-98.6 F (37 C)] 97.9 F (36.6 C) (11/01 0508) Pulse Rate:  [68-70] 68  (11/01 0508) Resp:  [16] 16  (11/01 0508) BP: (106-146)/(69-116) 106/69 mmHg (11/01 0508) SpO2:  [97 %-98 %] 98 % (11/01 0508) Last BM Date: 04/21/12  Intake/Output from previous day: 10/31 0701 - 11/01 0700 In: 2988.2 [I.V.:1039.3; ONG:2952.8] Out: 3301 [Urine:3050; Emesis/NG output:250; Stool:1] Intake/Output this shift:    General appearance: alert, cooperative and no distress GI: soft, NT currently, possibly slightly more distended but not much, no peritoneal signs  Lab Results:  No results found for this basename: WBC:2,HGB:2,HCT:2,PLT:2 in the last 72 hours BMET  [facility] 04/21/12 0550 04/20/12 0550  NA 133* 133*  K 4.7 4.3  CL 98 97  CO2 30 31  GLUCOSE 94 94  BUN 26* 26*  CREATININE 0.68 0.65  CALCIUM 9.2 9.2   PT/INR No results found for this basename: LABPROT:2,INR:2 in the last 72 hours ABG No results found for this basename: PHART:2,PCO2:2,PO2:2,HCO3:2 in the last 72 hours  Studies/Results: No results found.  Anti-infectives: Anti-infectives     Start     Dose/Rate Route Frequency Ordered Stop   04/12/12 0445   cefTRIAXone (ROCEPHIN) 1 g in dextrose 5 % 50 mL IVPB        1 g 100 mL/hr over 30 Minutes Intravenous  Once 04/12/12 0435 04/12/12 0615          Assessment/Plan: s/p Procedure(s) (LRB) with comments: EXPLORATORY LAPAROTOMY (N/A) I think that we have given it plenty of opportunity to open up with nonop management and I thought that she would be willing to have surgery since she seemed to fail clamping yesterday.  I AGAIN recommended surgery for  failure to progress and explained the reasons and the purpose of the surgery to remove any blockage.  She AGAIN refused and feels that she is getting better.  She seems to have a plan that we can intermittently clamp and unclamp the tube as desired and this will be good for her.  I tried to explain to her that this is not a sign of progress and is not feasible to do for discharge, and that often the longer that we wait, the higher the risk of bowel compromise or injury or potentially worse outcome.  Fortunately, she has not forced our hand with any signs of fevers, leukocytosis, or peritonitis but I am concerned that her outcome after surgery may not be as desirable if she continues to wait to allow [place] to proceed with operative intervention.  LOS: 11 days    Lodema Pilot DAVID 04/22/2012

## 2012-04-22 NOTE — Progress Notes (Signed)
I spoke with the patients family and daughter and again explained the situation and lack of progress with her.  I again recommended that she have surgery due to lack of progression.  The daughter asked if we could wait until Tuesday because she would be off work then.  I explained that we would not wait until she could be off work and were not going to make recommendations based on the work schedule.  The patient has been refusing surgery.  They said that they would talk with her and help her understand the situation so she could make a good decision.

## 2012-04-23 ENCOUNTER — Encounter (HOSPITAL_COMMUNITY): Payer: Self-pay | Admitting: Anesthesiology

## 2012-04-23 ENCOUNTER — Encounter (HOSPITAL_COMMUNITY): Admission: EM | Disposition: A | Discharge status: Home / Self Care | Payer: Self-pay | Source: Home / Self Care

## 2012-04-23 ENCOUNTER — Inpatient Hospital Stay (HOSPITAL_COMMUNITY): Payer: Medicare Other | Admitting: Anesthesiology

## 2012-04-23 DIAGNOSIS — K565 Intestinal adhesions [bands], unspecified as to partial versus complete obstruction: Secondary | ICD-10-CM

## 2012-04-23 HISTORY — PX: LAPAROTOMY: SHX154

## 2012-04-23 LAB — CBC WITH DIFFERENTIAL/PLATELET
Basophils Absolute: 0 10*3/uL (ref 0.0–0.1)
Basophils Relative: 0 % (ref 0–1)
Eosinophils Absolute: 0.3 10*3/uL (ref 0.0–0.7)
Eosinophils Relative: 3 % (ref 0–5)
Lymphs Abs: 0.8 10*3/uL (ref 0.7–4.0)
MCH: 31.1 pg (ref 26.0–34.0)
MCHC: 33.6 g/dL (ref 30.0–36.0)
MCV: 92.4 fL (ref 78.0–100.0)
Neutrophils Relative %: 82 % — ABNORMAL HIGH (ref 43–77)
Platelets: 233 10*3/uL (ref 150–400)
RDW: 13.1 % (ref 11.5–15.5)

## 2012-04-23 LAB — BASIC METABOLIC PANEL
CO2: 27 mEq/L (ref 19–32)
Calcium: 9.4 mg/dL (ref 8.4–10.5)
Creatinine, Ser: 0.71 mg/dL (ref 0.50–1.10)
GFR calc non Af Amer: 82 mL/min — ABNORMAL LOW (ref >90)
Glucose, Bld: 104 mg/dL — ABNORMAL HIGH (ref 70–99)

## 2012-04-23 SURGERY — LAPAROTOMY, EXPLORATORY
Anesthesia: General | Site: Abdomen | Wound class: Clean

## 2012-04-23 MED ORDER — BUPIVACAINE HCL (PF) 0.5 % IJ SOLN
INTRAMUSCULAR | Status: DC | PRN
  Administered 2012-04-23: 20 mL

## 2012-04-23 MED ORDER — EPHEDRINE SULFATE 50 MG/ML IJ SOLN
INTRAMUSCULAR | Status: DC | PRN
  Administered 2012-04-23: 10 mg via INTRAVENOUS
  Administered 2012-04-23: 5 mg via INTRAVENOUS

## 2012-04-23 MED ORDER — SODIUM CHLORIDE 0.9 % IV SOLN: INTRAVENOUS | Status: DC

## 2012-04-23 MED ORDER — ONDANSETRON HCL 4 MG/2ML IJ SOLN
INTRAMUSCULAR | Status: DC | PRN
  Administered 2012-04-23: 4 mg via INTRAVENOUS

## 2012-04-23 MED ORDER — NEOSTIGMINE METHYLSULFATE 1 MG/ML IJ SOLN
INTRAMUSCULAR | Status: DC | PRN
  Administered 2012-04-23: 4 mg via INTRAVENOUS

## 2012-04-23 MED ORDER — DEXTROSE 5 % IV SOLN
1.0000 g | INTRAVENOUS | Status: DC | PRN
  Administered 2012-04-23: 1 g via INTRAVENOUS

## 2012-04-23 MED ORDER — NALOXONE HCL 0.4 MG/ML IJ SOLN
INTRAMUSCULAR | Status: DC | PRN
  Administered 2012-04-23: 120 ug via INTRAVENOUS

## 2012-04-23 MED ORDER — HYDROMORPHONE HCL PF 1 MG/ML IJ SOLN: 0.2500 mg | INTRAMUSCULAR | Status: DC | PRN

## 2012-04-23 MED ORDER — PROPOFOL 10 MG/ML IV BOLUS
INTRAVENOUS | Status: DC | PRN
  Administered 2012-04-23: 100 mg via INTRAVENOUS

## 2012-04-23 MED ORDER — LIDOCAINE HCL (CARDIAC) 20 MG/ML IV SOLN
INTRAVENOUS | Status: DC | PRN
  Administered 2012-04-23: 50 mg via INTRAVENOUS

## 2012-04-23 MED ORDER — CLINIMIX E/DEXTROSE (5/15) 5 % IV SOLN
INTRAVENOUS | Status: AC
  Administered 2012-04-23: 17:00:00 via INTRAVENOUS
  Filled 2012-04-23: qty 2000

## 2012-04-23 MED ORDER — 0.9 % SODIUM CHLORIDE (POUR BTL) OPTIME
TOPICAL | Status: DC | PRN
  Administered 2012-04-23: 2000 mL

## 2012-04-23 MED ORDER — FENTANYL CITRATE 0.05 MG/ML IJ SOLN
INTRAMUSCULAR | Status: DC | PRN
  Administered 2012-04-23 (×3): 50 ug via INTRAVENOUS

## 2012-04-23 MED ORDER — SODIUM CHLORIDE 0.9 % IV SOLN: Freq: Once | INTRAVENOUS | Status: DC

## 2012-04-23 MED ORDER — SODIUM CHLORIDE 0.9 % IV SOLN
INTRAVENOUS | Status: DC | PRN
  Administered 2012-04-23: 21:00:00 via INTRAVENOUS

## 2012-04-23 MED ORDER — ROCURONIUM BROMIDE 100 MG/10ML IV SOLN
INTRAVENOUS | Status: DC | PRN
  Administered 2012-04-23: 5 mg via INTRAVENOUS
  Administered 2012-04-23: 25 mg via INTRAVENOUS

## 2012-04-23 MED ORDER — SUCCINYLCHOLINE CHLORIDE 20 MG/ML IJ SOLN
INTRAMUSCULAR | Status: DC | PRN
  Administered 2012-04-23: 100 mg via INTRAVENOUS

## 2012-04-23 MED ORDER — HEPARIN SODIUM (PORCINE) 5000 UNIT/ML IJ SOLN
5000.0000 [IU] | Freq: Three times a day (TID) | INTRAMUSCULAR | Status: DC
  Administered 2012-04-23 – 2012-05-10 (×51): 5000 [IU] via SUBCUTANEOUS
  Filled 2012-04-23 (×53): qty 1

## 2012-04-23 MED ORDER — PROMETHAZINE HCL 25 MG/ML IJ SOLN: 6.2500 mg | INTRAMUSCULAR | Status: DC | PRN

## 2012-04-23 MED ORDER — KETOROLAC TROMETHAMINE 15 MG/ML IJ SOLN
15.0000 mg | Freq: Once | INTRAMUSCULAR | Status: AC
  Administered 2012-04-23: 15 mg via INTRAVENOUS

## 2012-04-23 MED ORDER — GLYCOPYRROLATE 0.2 MG/ML IJ SOLN
INTRAMUSCULAR | Status: DC | PRN
  Administered 2012-04-23: 0.6 mg via INTRAVENOUS

## 2012-04-23 SURGICAL SUPPLY — 41 items
APPLICATOR COTTON TIP 6IN STRL (MISCELLANEOUS) ×2 IMPLANT
BLADE EXTENDED COATED 6.5IN (ELECTRODE) ×2 IMPLANT
BLADE HEX COATED 2.75 (ELECTRODE) ×2 IMPLANT
CANISTER SUCTION 2500CC (MISCELLANEOUS) ×2 IMPLANT
CATH ROBINSON RED A/P 10FR (CATHETERS) ×2 IMPLANT
CLOTH BEACON ORANGE TIMEOUT ST (SAFETY) ×2 IMPLANT
COVER MAYO STAND STRL (DRAPES) ×2 IMPLANT
DRAPE LAPAROSCOPIC ABDOMINAL (DRAPES) ×2 IMPLANT
DRAPE WARM FLUID 44X44 (DRAPE) ×2 IMPLANT
DRSG PAD ABDOMINAL 8X10 ST (GAUZE/BANDAGES/DRESSINGS) ×2 IMPLANT
ELECT REM PT RETURN 9FT ADLT (ELECTROSURGICAL) ×2
ELECTRODE REM PT RTRN 9FT ADLT (ELECTROSURGICAL) ×1 IMPLANT
GLOVE BIOGEL M 8.0 STRL (GLOVE) ×2 IMPLANT
GLOVE BIOGEL PI IND STRL 7.0 (GLOVE) ×1 IMPLANT
GLOVE BIOGEL PI INDICATOR 7.0 (GLOVE) ×1
GOWN STRL NON-REIN LRG LVL3 (GOWN DISPOSABLE) ×2 IMPLANT
GOWN STRL REIN XL XLG (GOWN DISPOSABLE) ×4 IMPLANT
KIT BASIN OR (CUSTOM PROCEDURE TRAY) ×2 IMPLANT
NEEDLE HYPO 22GX1.5 SAFETY (NEEDLE) ×2 IMPLANT
NS IRRIG 1000ML POUR BTL (IV SOLUTION) ×4 IMPLANT
PACK GENERAL/GYN (CUSTOM PROCEDURE TRAY) ×2 IMPLANT
SPONGE GAUZE 4X4 12PLY (GAUZE/BANDAGES/DRESSINGS) ×2 IMPLANT
SPONGE LAP 18X18 X RAY DECT (DISPOSABLE) IMPLANT
STAPLER VISISTAT 35W (STAPLE) ×2 IMPLANT
SUCTION POOLE TIP (SUCTIONS) ×2 IMPLANT
SUT ETHILON 1 LR 30 (SUTURE) ×4 IMPLANT
SUT PDS AB 1 CT1 27 (SUTURE) ×4 IMPLANT
SUT PDS AB 1 CTX 36 (SUTURE) IMPLANT
SUT SILK 2 0 (SUTURE) ×1
SUT SILK 2 0 SH CR/8 (SUTURE) ×2 IMPLANT
SUT SILK 2-0 18XBRD TIE 12 (SUTURE) ×1 IMPLANT
SUT SILK 3 0 (SUTURE)
SUT SILK 3 0 SH CR/8 (SUTURE) ×2 IMPLANT
SUT SILK 3-0 18XBRD TIE 12 (SUTURE) IMPLANT
SUT VICRYL 2 0 18  UND BR (SUTURE)
SUT VICRYL 2 0 18 UND BR (SUTURE) IMPLANT
SYR CONTROL 10ML LL (SYRINGE) ×2 IMPLANT
TAPE CLOTH SURG 4X10 WHT LF (GAUZE/BANDAGES/DRESSINGS) ×2 IMPLANT
TOWEL OR 17X26 10 PK STRL BLUE (TOWEL DISPOSABLE) ×2 IMPLANT
TRAY FOLEY CATH 14FRSI W/METER (CATHETERS) ×2 IMPLANT
YANKAUER SUCT BULB TIP NO VENT (SUCTIONS) ×2 IMPLANT

## 2012-04-23 NOTE — Anesthesia Preprocedure Evaluation (Addendum)
Anesthesia Evaluation  Patient identified by MRN, date of birth, ID band Patient awake    Reviewed: Allergy & Precautions, H&P , NPO status , Patient's Chart, lab work & pertinent test results  Airway Mallampati: II TM Distance: <3 FB Neck ROM: Full    Dental No notable dental hx.    Pulmonary COPD breath sounds clear to auscultation  Pulmonary exam normal       Cardiovascular hypertension, Pt. on medications Rhythm:Regular Rate:Normal     Neuro/Psych negative neurological ROS  negative psych ROS   GI/Hepatic negative GI ROS, Neg liver ROS,   Endo/Other  negative endocrine ROS  Renal/GU negative Renal ROS  negative genitourinary   Musculoskeletal negative musculoskeletal ROS (+)   Abdominal   Peds negative pediatric ROS (+)  Hematology negative hematology ROS (+)   Anesthesia Other Findings   Reproductive/Obstetrics negative OB ROS                          Anesthesia Physical Anesthesia Plan  ASA: III  Anesthesia Plan: General   Post-op Pain Management:    Induction: Intravenous, Rapid sequence and Cricoid pressure planned  Airway Management Planned: Oral ETT  Additional Equipment:   Intra-op Plan:   Post-operative Plan: Extubation in OR  Informed Consent: I have reviewed the patients History and Physical, chart, labs and discussed the procedure including the risks, benefits and alternatives for the proposed anesthesia with the patient or authorized representative who has indicated his/her understanding and acceptance.   Dental advisory given  Plan Discussed with: CRNA and Surgeon  Anesthesia Plan Comments:        Anesthesia Quick Evaluation

## 2012-04-23 NOTE — Progress Notes (Signed)
Patient ID: Amber Rowland, female   DOB: 1933-12-07, 78 y.o.   MRN: [id number] [facility] Surgery Progress Note:   2 Days Post-Op  Subjective: Mental status is clear.  Complaining of abdominal pain since tube was clamped.  I placed this back to suction.  Patient is now saying that she will consent to surgery.   Objective: Vital signs in last 24 hours: Temp:  [97.9 F (36.6 C)-99.1 F (37.3 C)] 98.7 F (37.1 C) (11/02 0630) Pulse Rate:  [68-81] 81  (11/02 0630) Resp:  [16] 16  (11/02 0630) BP: (103-133)/(61-70) 114/61 mmHg (11/02 0630) SpO2:  [94 %-97 %] 97 % (11/02 0630)  Intake/Output from previous day: 11/01 0701 - 11/02 0700 In: 2571.8 [I.V.:1000.7; TPN:1571.2] Out: 850 [Urine:850] Intake/Output this shift:    Physical Exam: Work of breathing is normal.  Abdomen is mildly distended.  No rebound or guarding.    Lab Results:  Results for orders placed during the hospital encounter of 04/11/12 (from the past 48 hour(s))  GLUCOSE, CAPILLARY     Status: Normal   Collection Time   04/21/12  4:00 PM      Component Value Range Comment   Glucose-Capillary 99  70 - 99 mg/dL    Comment 1 Notify RN     GLUCOSE, CAPILLARY     Status: Abnormal   Collection Time   04/21/12  8:00 PM      Component Value Range Comment   Glucose-Capillary 106 (*) 70 - 99 mg/dL   GLUCOSE, CAPILLARY     Status: Abnormal   Collection Time   04/22/12  5:03 AM      Component Value Range Comment   Glucose-Capillary 118 (*) 70 - 99 mg/dL   GLUCOSE, CAPILLARY     Status: Abnormal   Collection Time   04/22/12  6:17 PM      Component Value Range Comment   Glucose-Capillary 118 (*) 70 - 99 mg/dL   GLUCOSE, CAPILLARY     Status: Abnormal   Collection Time   04/23/12  2:22 AM      Component Value Range Comment   Glucose-Capillary 117 (*) 70 - 99 mg/dL   BASIC METABOLIC PANEL     Status: Abnormal   Collection Time   04/23/12  4:15 AM      Component Value Range Comment   Sodium 130 (*) 135 - 145 mEq/L      Potassium 4.6  3.5 - 5.1 mEq/L    Chloride 96  96 - 112 mEq/L    CO2 27  19 - 32 mEq/L    Glucose, Bld 104 (*) 70 - 99 mg/dL    BUN 27 (*) 6 - 23 mg/dL    Creatinine, Ser 1.19  0.50 - 1.10 mg/dL    Calcium 9.4  8.4 - 14.7 mg/dL    GFR calc non Af Amer 82 (*) >90 mL/min    GFR calc Af Amer >90  >90 mL/min   CBC WITH DIFFERENTIAL     Status: Abnormal   Collection Time   04/23/12  4:15 AM      Component Value Range Comment   WBC 10.9 (*) 4.0 - 10.5 K/uL    RBC 3.57 (*) 3.87 - 5.11 MIL/uL    Hemoglobin 11.1 (*) 12.0 - 15.0 g/dL    HCT 82.9 (*) 56.2 - 46.0 %    MCV 92.4  78.0 - 100.0 fL    MCH 31.1  26.0 - 34.0 pg  MCHC 33.6  30.0 - 36.0 g/dL    RDW 16.1  09.6 - 04.5 %    Platelets 233  150 - 400 K/uL    Neutrophils Relative 82 (*) 43 - 77 %    Neutro Abs 8.9 (*) 1.7 - 7.7 K/uL    Lymphocytes Relative 8 (*) 12 - 46 %    Lymphs Abs 0.8  0.7 - 4.0 K/uL    Monocytes Relative 8  3 - 12 %    Monocytes Absolute 0.9  0.1 - 1.0 K/uL    Eosinophils Relative 3  0 - 5 %    Eosinophils Absolute 0.3  0.0 - 0.7 K/uL    Basophils Relative 0  0 - 1 %    Basophils Absolute 0.0  0.0 - 0.1 K/uL     Radiology/Results: No results found.  Anti-infectives: Anti-infectives     Start     Dose/Rate Route Frequency Ordered Stop   04/12/12 0445   cefTRIAXone (ROCEPHIN) 1 g in dextrose 5 % 50 mL IVPB        1 g 100 mL/hr over 30 Minutes Intravenous  Once 04/12/12 0435 04/12/12 0615          Assessment/Plan: Problem List: Patient Active Problem List  Diagnosis  . UTI (lower urinary tract infection)  . Chronic constipation  . COPD (chronic obstructive pulmonary disease)  . Tobacco abuse  . SBO (small bowel obstruction)  . HTN (hypertension)  . Benign breast cysts in female  . Anxiety  . Lung nodules, ?incidental    SBO Will try to get on OR schedule at some point.   2 Days Post-Op    LOS: 12 days   Matt B. Daphine Deutscher, MD, [facility] [phone]  beeper [phone]  04/23/2012 8:50 AM

## 2012-04-23 NOTE — Op Note (Signed)
Surgeon: Wenda Low, MD, FACS  Asst:  none  Anes:  Gen.  Procedure: Exploratory laparotomy with singleband enteral lysis  Diagnosis: Small bowel obstruction  Complications: none  EBL:   minimal cc  Description of Procedure:  The patient was taken to operating room 1 on Saturday evening November 2. She has a prone belly and distorted anatomy. After prepping and draping and after a timeout a midline incision was used through her very thin abdomen to enter without difficulty. Following entry I traced the small intestine where it was not dilated and found it to go beneath the band. I isolated that band and then I transected it. Where the small bowel was tethered I oversewed that with 2 3-0 silk sutures. I then identified the ligament of Treitz after completely freeing the abdomen of small intestine and ran the bowel from the ligament of Treitz to the terminal ileum. I milked some of the enteric contents downstream in the nondilated bowel and then return the bowel to the abdomen.  Sponge and needle counts reported as correct. I closed the abdomen first placing 2 retention sutures and then closed the fascia with running #1 PDS and after irrigating injected with Marcaine with staples. The patient seemed to tolerate the procedure well was taken recovery room prior to return to her room on 6 E.  Matt B. Daphine Deutscher, MD, [facility] Surgery, [name] [phone]

## 2012-04-23 NOTE — Progress Notes (Signed)
PARENTERAL NUTRITION CONSULT NOTE   Pharmacy Consult for TNA Indication: SBO  No Known Allergies  Patient Measurements: Height: 4\' 9"  (144.8 cm) Weight: 85 lb (38.556 kg) IBW/kg (Calculated) : 38.6    Vital Signs: Temp: 98.7 F (37.1 C) (11/02 0630) Temp src: Oral (11/02 0630) BP: 114/61 mmHg (11/02 0630) Pulse Rate: 81  (11/02 0630) Intake/Output from previous day: 11/01 0701 - 11/02 0700 In: 2571.8 [I.V.:1000.7; TPN:1571.2] Out: 850 [Urine:850]  Labs:  [facility] 04/23/12 0415  WBC 10.9*  HGB 11.1*  HCT 33.0*  PLT 233  APTT --  INR --     Basename 04/23/12 0415 04/21/12 0550  NA 130* 133*  K 4.6 4.7  CL 96 98  CO2 27 30  GLUCOSE 104* 94  BUN 27* 26*  CREATININE 0.71 0.68  LABCREA -- --  CREAT24HRUR -- --  CALCIUM 9.4 9.2  MG -- 2.1  PHOS -- 3.6  PROT -- 5.9*  ALBUMIN -- 2.6*  AST -- 21  ALT -- 13  ALKPHOS -- 73  BILITOT -- 0.2*  BILIDIR -- --  IBILI -- --  PREALBUMIN -- --  TRIG -- --  CHOLHDL -- --  CHOL -- --  11/2 Corrected Ca = 10.5 Estimated Creatinine Clearance: 36.5 ml/min (by C-G formula based on Cr of 0.71).   Medical History: Past Medical History  Diagnosis Date  . Glaucoma (increased eye pressure)   . Ovarian cyst, right 2009    s/p BSO 2009: OVARIAN FIBROTHECOMA in multiple fluid filled cysts  4.5cm region  . Atrophy of left kidney     Chronic atrophy of the left kidney.  . Chronic constipation   . COPD (chronic obstructive pulmonary disease)     Hyperinflation and heavy smoking history strongly  . HTN (hypertension) 04/12/2012  . Benign breast cysts in female 04/12/2012    Seen on mammography 2009   . UTI (lower urinary tract infection)     History of chronic recurrent UTIs    Medications:  Scheduled:     . bisacodyl  10 mg Rectal Daily  . heparin  5,000 Units Subcutaneous Q8H  . lip balm  1 application Topical BID  . pantoprazole (PROTONIX) IV  40 mg Intravenous QHS   Infusions:     . dextrose 5 % and 0.45 %  NaCl with KCl 20 mEq/L 40 mL/hr at 04/22/12 1422  . fat emulsion 250 mL (04/22/12 1726)  . TPN (CLINIMIX) +/- additives 70 mL/hr at 04/21/12 1749  . TPN (CLINIMIX) +/- additives 70 mL/hr at 04/22/12 2200   Insulin Requirements in the past 24 hours:  CBGs: 117-118 No insulin ordered  Nutritional Goals:  RD recs (10/27): 1300-1500 kcal and 60-80 g protein per day Clinimix E 5/15  at a goal rate of 90ml/hr + IVFE 20% at 54ml/hr on MWF to provide: 84 g/day protein, 1398 Kcal/day avg.(1193 Kcal/day MWF, 1672 Kcal/day STTHS).  Current Nutrition:  CL diet starting 10/31, NGT to suction Clinimix-E 5/15 at 70 mL/hr Fat Emulsion 20% at 10 mL/hr MWF  IVF: dextrose 5% and 0.45 % NaCl with KCl 20 mEq/L@ 40 mL/hr  Assessment: 78 YOF admitted 10/21 w/ abdominal pain, vomiting and not tolerating POs. Found to have SBO and tx non-sugically w/ NG decompression, failing to progress . Has been NPO since admission. TNA started 10/26.   Patient appears to be tolerating TNA. She still requires NGT and declines surgery at this time.   Electrolytes: wnl except Na slightly low (unable to adjust  in TNA)  CBG: controlled (< 150 mg/dL)  LFTs wnl 16/10  TGs: wnl 10/28  Prealbumin: 11.5 (10/27), 12.8 (10/28)   Plan:   Continue Clinimix-E 5/15 at goal rate of 70 mL/hr  Fat Emulsion 20% at 10 mL/hr on MWF only (due to national shortage)  Multivitamins and trace elements on MWF only (due to [employer])  Maintenance IVF rate per MD   Full TNA labs on Mondays and Thursdays    Lynann Beaver PharmD, BCPS Pager [phone] 04/23/2012 7:22 AM

## 2012-04-23 NOTE — Transfer of Care (Signed)
Immediate Anesthesia Transfer of Care Note  Patient: Amber Rowland  Procedure(s) Performed: Procedure(s) (LRB) with comments: EXPLORATORY LAPAROTOMY (N/A) - enterolysis   Patient Location: PACU  Anesthesia Type:General  Level of Consciousness: awake, alert  and patient cooperative  Airway & Oxygen Therapy: Patient Spontanous Breathing and Patient connected to face mask oxygen  Post-op Assessment: Report given to PACU RN, Post -op Vital signs reviewed and stable and Patient moving all extremities X 4  Post vital signs: stable  Complications: No apparent anesthesia complications

## 2012-04-24 LAB — BASIC METABOLIC PANEL
Calcium: 9.2 mg/dL (ref 8.4–10.5)
GFR calc non Af Amer: 81 mL/min — ABNORMAL LOW (ref >90)
Glucose, Bld: 108 mg/dL — ABNORMAL HIGH (ref 70–99)
Sodium: 130 mEq/L — ABNORMAL LOW (ref 135–145)

## 2012-04-24 MED ORDER — SODIUM CHLORIDE 0.9 % IV SOLN
INTRAVENOUS | Status: DC
  Administered 2012-04-24 – 2012-04-28 (×4): via INTRAVENOUS
  Administered 2012-04-28: 100 mL/h via INTRAVENOUS
  Administered 2012-04-29: 17:00:00 via INTRAVENOUS
  Administered 2012-04-29 – 2012-04-30 (×2): 100 mL/h via INTRAVENOUS
  Administered 2012-05-01 (×2): via INTRAVENOUS

## 2012-04-24 MED ORDER — CLINIMIX E/DEXTROSE (5/15) 5 % IV SOLN
INTRAVENOUS | Status: AC
  Administered 2012-04-24: 17:00:00 via INTRAVENOUS
  Filled 2012-04-24: qty 2000

## 2012-04-24 MED ORDER — HYDROMORPHONE HCL PF 1 MG/ML IJ SOLN
0.5000 mg | INTRAMUSCULAR | Status: DC | PRN
  Administered 2012-04-24 – 2012-04-28 (×23): 0.5 mg via INTRAVENOUS
  Filled 2012-04-24 (×23): qty 1

## 2012-04-24 NOTE — Progress Notes (Signed)
PARENTERAL NUTRITION CONSULT NOTE   Pharmacy Consult for TNA Indication: SBO  No Known Allergies  Patient Measurements: Height: 4\' 9"  (144.8 cm) Weight: 85 lb (38.556 kg) IBW/kg (Calculated) : 38.6    Vital Signs: Temp: 98.6 F (37 C) (11/03 0923) Temp src: Oral (11/03 0923) BP: 106/59 mmHg (11/03 0923) Pulse Rate: 75  (11/03 0923) Intake/Output from previous day: 11/02 0701 - 11/03 0700 In: 1800 [I.V.:1320; TPN:480] Out: 1150 [Urine:1100; Blood:50]  Labs:  [facility] 04/23/12 0415  WBC 10.9*  HGB 11.1*  HCT 33.0*  PLT 233  APTT --  INR --     Basename 04/24/12 0900 04/23/12 0415  NA 130* 130*  K 5.3* 4.6  CL 100 96  CO2 25 27  GLUCOSE 108* 104*  BUN 33* 27*  CREATININE 0.74 0.71  LABCREA -- --  CREAT24HRUR -- --  CALCIUM 9.2 9.4  MG -- --  PHOS -- --  PROT -- --  ALBUMIN -- --  AST -- --  ALT -- --  ALKPHOS -- --  BILITOT -- --  BILIDIR -- --  IBILI -- --  PREALBUMIN -- --  TRIG -- --  CHOLHDL -- --  CHOL -- --  11/2 Corrected Ca = 10.5 Estimated Creatinine Clearance: 36.5 ml/min (by C-G formula based on Cr of 0.74).   Medical History: Past Medical History  Diagnosis Date  . Glaucoma (increased eye pressure)   . Ovarian cyst, right 2009    s/p BSO 2009: OVARIAN FIBROTHECOMA in multiple fluid filled cysts  4.5cm region  . Atrophy of left kidney     Chronic atrophy of the left kidney.  . Chronic constipation   . COPD (chronic obstructive pulmonary disease)     Hyperinflation and heavy smoking history strongly  . HTN (hypertension) 04/12/2012  . Benign breast cysts in female 04/12/2012    Seen on mammography 2009   . UTI (lower urinary tract infection)     History of chronic recurrent UTIs    Medications:  Scheduled:     . heparin  5,000 Units Subcutaneous Q8H  . [COMPLETED] ketorolac  15 mg Intravenous Once  . lip balm  1 application Topical BID  . pantoprazole (PROTONIX) IV  40 mg Intravenous QHS  . [DISCONTINUED] sodium chloride    Intravenous Once  . [DISCONTINUED] bisacodyl  10 mg Rectal Daily  . [DISCONTINUED] heparin  5,000 Units Subcutaneous Q8H   Infusions:     . dextrose 5 % and 0.45 % NaCl with KCl 20 mEq/L 40 mL/hr (04/23/12 1424)  . [EXPIRED] fat emulsion 250 mL (04/22/12 1726)  . [EXPIRED] TPN (CLINIMIX) +/- additives 70 mL/hr at 04/22/12 2200  . TPN (CLINIMIX) +/- additives 70 mL/hr (04/23/12 2100)  . [DISCONTINUED] sodium chloride     Insulin Requirements in the past 24 hours:  CBGs: 116-117 No insulin ordered  Nutritional Goals:  RD recs (10/27): 1300-1500 kcal and 60-80 g protein per day Clinimix E 5/15  at a goal rate of 93ml/hr + IVFE 20% at 38ml/hr on MWF to provide: 84 g/day protein, 1398 Kcal/day avg. (1193 Kcal/day MWF, 1672 Kcal/day STTHS).  Current Nutrition:  CL diet started 11/3  Clinimix-E 5/15 at 70 mL/hr Fat Emulsion 20% at 10 mL/hr MWF  IVF: dextrose 5% and 0.45 % NaCl with KCl 20 mEq/L@ 40 mL/hr  Assessment: 78 YOF admitted 10/21 w/ abdominal pain, vomiting and not tolerating POs. Found to have SBO and tx non-sugically w/ NG decompression, failing to progress . Has been  NPO since admission. TNA started 10/26. Consented to surgery 11/2 and was taken to OR 11/2 at ~2200 for exploratory laparotomy with singleband enteral lysis. Patient appears to be tolerating TNA, starting CLD today.     Electrolytes: K elevated at 5.3, Na remains slightly low (unable to adjust in TNA)  CBG: controlled (< 150 mg/dL)  LFTs wnl 16/10  TGs: wnl 10/28  Prealbumin: 11.5 (10/27), 12.8 (10/28)  Plan:   Continue Clinimix-E 5/15 at goal rate of 70 mL/hr  Fat Emulsion 20% at 10 mL/hr on MWF only (due to Sport and exercise psychologist)  Multivitamins and trace elements on MWF only (due to Sport and exercise psychologist)  Maintenance IVF changed from D5 1/2NS with 20KCl to NS at the same rate per MD order.                                Full TNA labs on Mondays and Thursdays - f/u K  F/U if tolerating diet and  when we can begin to wean TNA    Lynann Beaver PharmD, BCPS Pager [phone] 04/24/2012 12:11 PM

## 2012-04-24 NOTE — Progress Notes (Signed)
[facility] Progress Note:   1 Day Post-Op  Subjective: Mental status is clear Objective: Vital signs in last 24 hours: Temp:  [97.6 F (36.4 C)-98.6 F (37 C)] 98.6 F (37 C) (11/03 0923) Pulse Rate:  [65-82] 75  (11/03 0923) Resp:  [16-26] 18  (11/03 0923) BP: (99-150)/(49-82) 106/59 mmHg (11/03 0923) SpO2:  [97 %-100 %] 98 % (11/03 0923)  Intake/Output from previous day: 11/02 0701 - 11/03 0700 In: 1800 [I.V.:1320; TPN:480] Out: 1150 [Urine:1100; Blood:50] Intake/Output this shift: Total I/O In: 20 [I.V.:20] Out: -   Physical Exam: Work of breathing is not labored.  Doing well with no nausea.    Lab Results:  Results for orders placed during the hospital encounter of 04/11/12 (from the past 48 hour(s))  GLUCOSE, CAPILLARY     Status: Abnormal   Collection Time   04/22/12  6:17 PM      Component Value Range Comment   Glucose-Capillary 118 (*) 70 - 99 mg/dL   GLUCOSE, CAPILLARY     Status: Abnormal   Collection Time   04/23/12  2:22 AM      Component Value Range Comment   Glucose-Capillary 117 (*) 70 - 99 mg/dL   BASIC METABOLIC PANEL     Status: Abnormal   Collection Time   04/23/12  4:15 AM      Component Value Range Comment   Sodium 130 (*) 135 - 145 mEq/L    Potassium 4.6  3.5 - 5.1 mEq/L    Chloride 96  96 - 112 mEq/L    CO2 27  19 - 32 mEq/L    Glucose, Bld 104 (*) 70 - 99 mg/dL    BUN 27 (*) 6 - 23 mg/dL    Creatinine, Ser 1.61  0.50 - 1.10 mg/dL    Calcium 9.4  8.4 - 09.6 mg/dL    GFR calc non Af Amer 82 (*) >90 mL/min    GFR calc Af Amer >90  >90 mL/min   CBC WITH DIFFERENTIAL     Status: Abnormal   Collection Time   04/23/12  4:15 AM      Component Value Range Comment   WBC 10.9 (*) 4.0 - 10.5 K/uL    RBC 3.57 (*) 3.87 - 5.11 MIL/uL    Hemoglobin 11.1 (*) 12.0 - 15.0 g/dL    HCT 04.5 (*) 40.9 - 46.0 %    MCV 92.4  78.0 - 100.0 fL    MCH 31.1  26.0 - 34.0 pg    MCHC 33.6  30.0 - 36.0 g/dL    RDW 81.1  91.4 - 78.2 %    Platelets 233   150 - 400 K/uL    Neutrophils Relative 82 (*) 43 - 77 %    Neutro Abs 8.9 (*) 1.7 - 7.7 K/uL    Lymphocytes Relative 8 (*) 12 - 46 %    Lymphs Abs 0.8  0.7 - 4.0 K/uL    Monocytes Relative 8  3 - 12 %    Monocytes Absolute 0.9  0.1 - 1.0 K/uL    Eosinophils Relative 3  0 - 5 %    Eosinophils Absolute 0.3  0.0 - 0.7 K/uL    Basophils Relative 0  0 - 1 %    Basophils Absolute 0.0  0.0 - 0.1 K/uL   GLUCOSE, CAPILLARY     Status: Abnormal   Collection Time   04/23/12  2:21 PM      Component Value Range  Comment   Glucose-Capillary 116 (*) 70 - 99 mg/dL     Radiology/Results: No results found.  Anti-infectives: Anti-infectives     Start     Dose/Rate Route Frequency Ordered Stop   04/12/12 0445   cefTRIAXone (ROCEPHIN) 1 g in dextrose 5 % 50 mL IVPB        1 g 100 mL/hr over 30 Minutes Intravenous  Once 04/12/12 0435 04/12/12 0615          Assessment/Plan: Problem List: Patient Active Problem List  Diagnosis  . UTI (lower urinary tract infection)  . Chronic constipation  . COPD (chronic obstructive pulmonary disease)  . Tobacco abuse  . SBO (small bowel obstruction)  . HTN (hypertension)  . Benign breast cysts in female  . Anxiety  . Lung nodules, ?incidental    Doing well.  Will offer clears and d/c Foley 1 Day Post-Op    LOS: 13 days   Matt B. Daphine Deutscher, MD, [facility] [phone] beeper [phone]  04/24/2012 9:57 AM

## 2012-04-25 ENCOUNTER — Inpatient Hospital Stay (HOSPITAL_COMMUNITY): Payer: Medicare Other

## 2012-04-25 ENCOUNTER — Encounter (HOSPITAL_COMMUNITY): Payer: Self-pay | Admitting: Surgery

## 2012-04-25 LAB — COMPREHENSIVE METABOLIC PANEL
Albumin: 2.1 g/dL — ABNORMAL LOW (ref 3.5–5.2)
Alkaline Phosphatase: 73 U/L (ref 39–117)
BUN: 29 mg/dL — ABNORMAL HIGH (ref 6–23)
Potassium: 5 mEq/L (ref 3.5–5.1)
Total Protein: 5.6 g/dL — ABNORMAL LOW (ref 6.0–8.3)

## 2012-04-25 LAB — DIFFERENTIAL
Basophils Absolute: 0 10*3/uL (ref 0.0–0.1)
Lymphocytes Relative: 10 % — ABNORMAL LOW (ref 12–46)
Monocytes Absolute: 1 10*3/uL (ref 0.1–1.0)
Neutro Abs: 6.7 10*3/uL (ref 1.7–7.7)

## 2012-04-25 LAB — GLUCOSE, CAPILLARY
Glucose-Capillary: 139 mg/dL — ABNORMAL HIGH (ref 70–99)
Glucose-Capillary: 98 mg/dL (ref 70–99)
Glucose-Capillary: 101 mg/dL — ABNORMAL HIGH (ref 70–99)
Glucose-Capillary: 111 mg/dL — ABNORMAL HIGH (ref 70–99)

## 2012-04-25 LAB — CHOLESTEROL, TOTAL: Cholesterol: 115 mg/dL (ref 0–200)

## 2012-04-25 LAB — MAGNESIUM: Magnesium: 1.9 mg/dL (ref 1.5–2.5)

## 2012-04-25 LAB — CBC
MCHC: 33.3 g/dL (ref 30.0–36.0)
Platelets: 229 10*3/uL (ref 150–400)
RDW: 13.5 % (ref 11.5–15.5)

## 2012-04-25 LAB — PHOSPHORUS: Phosphorus: 3.2 mg/dL (ref 2.3–4.6)

## 2012-04-25 LAB — TRIGLYCERIDES: Triglycerides: 42 mg/dL (ref <150)

## 2012-04-25 MED ORDER — TRACE MINERALS CR-CU-F-FE-I-MN-MO-SE-ZN IV SOLN
INTRAVENOUS | Status: AC
  Administered 2012-04-25: 18:00:00 via INTRAVENOUS
  Filled 2012-04-25: qty 2000

## 2012-04-25 MED ORDER — FAT EMULSION 20 % IV EMUL
240.0000 mL | INTRAVENOUS | Status: AC
  Administered 2012-04-25: 240 mL via INTRAVENOUS
  Filled 2012-04-25: qty 250

## 2012-04-25 NOTE — Progress Notes (Signed)
PARENTERAL NUTRITION CONSULT NOTE   Pharmacy Consult for TNA Indication: SBO  No Known Allergies  Patient Measurements: Height: 4\' 9"  (144.8 cm) Weight: 85 lb (38.556 kg) IBW/kg (Calculated) : 38.6    Vital Signs: Temp: 98.7 F (37.1 C) (11/04 0637) Temp src: Oral (11/04 0637) BP: 119/68 mmHg (11/04 0637) Pulse Rate: 74  (11/04 0637) Intake/Output from previous day: 11/03 0701 - 11/04 0700 In: 3557.2 [I.V.:733.3; IV Piggyback:4; TPN:2819.8] Out: 1065 [Urine:1065]  Labs:  [facility] 04/25/12 0423 04/23/12 0415  WBC 8.8 10.9*  HGB 9.8* 11.1*  HCT 29.4* 33.0*  PLT 229 233  APTT -- --  INR -- --     Basename 04/25/12 0423 04/24/12 0900 04/23/12 0415  NA 129* 130* 130*  K 5.0 5.3* 4.6  CL 99 100 96  CO2 27 25 27   GLUCOSE 99 108* 104*  BUN 29* 33* 27*  CREATININE 0.74 0.74 0.71  LABCREA -- -- --  CREAT24HRUR -- -- --  CALCIUM 9.4 9.2 9.4  MG 1.9 -- --  PHOS 3.2 -- --  PROT 5.6* -- --  ALBUMIN 2.1* -- --  AST 12 -- --  ALT 9 -- --  ALKPHOS 73 -- --  BILITOT 0.2* -- --  BILIDIR -- -- --  IBILI -- -- --  PREALBUMIN -- -- --  TRIG 42 -- --  CHOLHDL -- -- --  CHOL 115 -- --  11/4 Corrected Ca = 10.9 Estimated Creatinine Clearance: 36.5 ml/min (by C-G formula based on Cr of 0.74).    Insulin Requirements in the past 24 hours:  CBGs < 150,  No insulin ordered  Nutritional Goals:  RD recs (10/27): 1300-1500 kcal and 60-80 g protein per day Clinimix E 5/15  at a goal rate of 13ml/hr + IVFE 20% at 48ml/hr on MWF to provide: 84 g/day protein, 1398 Kcal/day avg. (1193 Kcal/day MWF, 1672 Kcal/day STTHS).  Current Nutrition:  CL diet started 11/3  Clinimix-E 5/15 at 70 mL/hr Fat Emulsion 20% at 10 mL/hr MWF  mIVF: NS @ 40 ml/hr  Assessment: 78 YOF admitted 10/21 w/ abdominal pain, vomiting and not tolerating POs. Found to have SBO and tx non-sugically w/ NG decompression, failing to progress . Has been NPO since admission. TNA started 10/26. Consented to  surgery 11/2 and was taken to OR 11/2 at ~2200 for exploratory laparotomy with singleband enteral lysis. Patient appears to be tolerating TNA, CL diet started 11/3. PO intake reported as 0 mL for 11/3. "Fair" appetite per documentation.  Per RN, pt only drinking coffee and a little water - nothing else.    Electrolytes: Na low (unable to adjust in TNA), K improved after KCl removed from IVF.  Other lytes ok.  CBG: controlled (< 150 mg/dL) on no insulin  Renal and hepatic function: wnl/stable   TGs and cholesterol: wnl 11/4  Prealbumin: 11/4 pending, 11.5 (10/27), 12.8 (10/28)  Plan:   Continue Clinimix-E 5/15 at goal rate of 70 mL/hr  Fat Emulsion 20% at 10 mL/hr on MWF only (due to national shortage)  Multivitamins and trace elements on MWF only (due to Sport and exercise psychologist)  F/u CBGs and add SSI insulin if needed                              Full TNA labs on Mondays and Thursdays   F/u PO toleration  Geoffry Paradise, PharmD, BCPS Pager: [phone] 8:43 AM Pharmacy #: [id number]

## 2012-04-25 NOTE — Progress Notes (Signed)
2 Days Post-Op  Subjective:  Very distended and uncomfortable.   "Since they made me eat." NO flatus/BM Objective: Vital signs in last 24 hours: Temp:  [98.2 F (36.8 C)-99.4 F (37.4 C)] 98.7 F (37.1 C) (11/04 0637) Pulse Rate:  [74-84] 74  (11/04 0637) Resp:  [16-18] 16  (11/04 0637) BP: (119-142)/(68-81) 119/68 mmHg (11/04 0637) SpO2:  [97 %-100 %] 100 % (11/04 0637) Last BM Date: 04/21/12  Diet: clears, TNA, afebrile, VSS, Na 129, afebrile, VSS,   Intake/Output from previous day: 11/03 0701 - 11/04 0700 In: 3557.2 [I.V.:733.3; IV Piggyback:4; TPN:2819.8] Out: 1065 [Urine:1065] Intake/Output this shift: Total I/O In: 240 [P.O.:240] Out: -   General appearance: alert, cooperative and mild distress Resp: clear to auscultation bilaterally and few rales in base. GI: siginificantly distended, no bowel sounds, not really tender, or more than you would expect. incision looks fine.  Lab Results:   Basename 04/25/12 0423 04/23/12 0415  WBC 8.8 10.9*  HGB 9.8* 11.1*  HCT 29.4* 33.0*  PLT 229 233    BMET  Basename 04/25/12 0423 04/24/12 0900  NA 129* 130*  K 5.0 5.3*  CL 99 100  CO2 27 25  GLUCOSE 99 108*  BUN 29* 33*  CREATININE 0.74 0.74  CALCIUM 9.4 9.2   PT/INR No results found for this basename: LABPROT:2,INR:2 in the last 72 hours   Lab 04/25/12 0423 04/21/12 0550  AST 12 21  ALT 9 13  ALKPHOS 73 73  BILITOT 0.2* 0.2*  PROT 5.6* 5.9*  ALBUMIN 2.1* 2.6*     Lipase     Component Value Date/Time   LIPASE 19 11/05/2007 1455     Studies/Results: No results found.  Medications:    . heparin  5,000 Units Subcutaneous Q8H  . lip balm  1 application Topical BID  . pantoprazole (PROTONIX) IV  40 mg Intravenous QHS      . sodium chloride 40 mL/hr at 04/24/12 1211  . TPN (CLINIMIX) +/- additives     And  . fat emulsion    . [EXPIRED] TPN (CLINIMIX) +/- additives 60 mL/hr at 04/24/12 1211  . TPN (CLINIMIX) +/- additives 70 mL/hr at 04/24/12  1712  . [DISCONTINUED] dextrose 5 % and 0.45 % NaCl with KCl 20 mEq/L Stopped (04/24/12 1211)    Assessment/Plan Small bowel obstruction S/P exploratory lap and lysis of adhesions, 04/23/12 Dr. Daphine Deutscher. Hypokalemia: likely due to NG output Tobacco use Malnutrition   Plan:  2 view abdomen, npo, she will need an NG placed again.  Continue TNA.    LOS: 14 days    Shonta Phillis 04/25/2012

## 2012-04-25 NOTE — Progress Notes (Signed)
General Surgery [facility]  Patient seen and examined.  NG tube replaced and bilious output noted.  Multiple complaints.  Appears stable post op.  Will continue to monitor and mobilize.  Velora Heckler, MD, [facility] Office: [phone]

## 2012-04-25 NOTE — Progress Notes (Signed)
Nutrition Follow-up  Intervention:  1. Recommend diet advancements as medically able.  2. TNA per pharmacy.  3. RD to follow for nutrition plan of care.   Assessment:   Patient was advanced to clear liquid diet with PO intake 50% at meals. Patient is now NPO again. Patient continues on Clinimix E 5/15 @ 70 ml/hr plus lipids 20% 10 ml/hr MWF. Provides a weekly average of 1398 kcal and 84 grams protein. Meets 100% of estimated energy and protein needs.   Diet Order:  NPO, TNA Clinimix E 5/15 @ 70 ml.hr plus lipids 20% 10 ml/hr MWF  Meds: Scheduled Meds:   . heparin  5,000 Units Subcutaneous Q8H  . lip balm  1 application Topical BID  . pantoprazole (PROTONIX) IV  40 mg Intravenous QHS   Continuous Infusions:   . sodium chloride 40 mL/hr at 04/24/12 1211  . TPN (CLINIMIX) +/- additives     And  . fat emulsion    . [EXPIRED] TPN (CLINIMIX) +/- additives 60 mL/hr at 04/24/12 1211  . TPN (CLINIMIX) +/- additives 70 mL/hr at 04/24/12 1712  . [DISCONTINUED] dextrose 5 % and 0.45 % NaCl with KCl 20 mEq/L Stopped (04/24/12 1211)   PRN Meds:.diphenhydrAMINE, HYDROmorphone (DILAUDID) injection, magic mouthwash, menthol-cetylpyridinium, ondansetron (ZOFRAN) IV, promethazine, sodium chloride   CMP     Component Value Date/Time   NA 129* 04/25/2012 0423   K 5.0 04/25/2012 0423   CL 99 04/25/2012 0423   CO2 27 04/25/2012 0423   GLUCOSE 99 04/25/2012 0423   BUN 29* 04/25/2012 0423   CREATININE 0.74 04/25/2012 0423   CALCIUM 9.4 04/25/2012 0423   PROT 5.6* 04/25/2012 0423   ALBUMIN 2.1* 04/25/2012 0423   AST 12 04/25/2012 0423   ALT 9 04/25/2012 0423   ALKPHOS 73 04/25/2012 0423   BILITOT 0.2* 04/25/2012 0423   GFRNONAA 81* 04/25/2012 0423   GFRAA >90 04/25/2012 0423    CBG (last 3)   Basename 04/25/12 0222 04/24/12 1253 04/23/12 1421  GLUCAP 98 139* 116*     Intake/Output Summary (Last 24 hours) at 04/25/12 1049 Last data filed at 04/25/12 0825  Gross per 24 hour  Intake 3777.17 ml    Output    875 ml  Net 2902.17 ml    Weight Status:  No new weights. Recommend re-weigh patient   Re-estimated needs:  1300-1500 kcal, 60-80 grams protein  Nutrition Dx:  Inadequate oral intake, ongoing.   Goal:  1. Advance diet as tolerated to regular diet. -Not meeting continue.   2. Meet > 90% of estimated energy needs with nutrition support. -Meeting, continue.   Monitor:  TNA per pharmacy, PO intake/ tolerance, weights, labs, diet advancements   Adron Bene [phone]

## 2012-04-26 ENCOUNTER — Inpatient Hospital Stay (HOSPITAL_COMMUNITY): Payer: Medicare Other

## 2012-04-26 LAB — CBC
MCHC: 33 g/dL (ref 30.0–36.0)
MCV: 92.9 fL (ref 78.0–100.0)
Platelets: 276 10*3/uL (ref 150–400)
RDW: 13.3 % (ref 11.5–15.5)
WBC: 7.6 10*3/uL (ref 4.0–10.5)

## 2012-04-26 LAB — GLUCOSE, CAPILLARY: Glucose-Capillary: 128 mg/dL — ABNORMAL HIGH (ref 70–99)

## 2012-04-26 LAB — BASIC METABOLIC PANEL
Calcium: 9.4 mg/dL (ref 8.4–10.5)
Creatinine, Ser: 0.63 mg/dL (ref 0.50–1.10)
GFR calc Af Amer: >90 mL/min (ref >90)

## 2012-04-26 LAB — PREALBUMIN: Prealbumin: 11.9 mg/dL — ABNORMAL LOW (ref 17.0–34.0)

## 2012-04-26 MED ORDER — CLINIMIX E/DEXTROSE (5/15) 5 % IV SOLN
INTRAVENOUS | Status: AC
  Administered 2012-04-26: 18:00:00 via INTRAVENOUS
  Filled 2012-04-26: qty 2000

## 2012-04-26 NOTE — Progress Notes (Signed)
3 Days Post-Op  Subjective: Still blown up like yesterday. No improvement.  Objective: Vital signs in last 24 hours: Temp:  [97.7 F (36.5 C)-99.3 F (37.4 C)] 97.7 F (36.5 C) (11/05 0544) Pulse Rate:  [66-77] 66  (11/05 0544) Resp:  [16-20] 16  (11/05 0544) BP: (119-156)/(66-81) 119/68 mmHg (11/05 0709) SpO2:  [90 %-94 %] 94 % (11/05 0544) Last BM Date: 04/21/12  300 from NG recorded, afebrile, VSS, Na continues to decline, H/H stable, still some scatter Small bowel gas seen, no free air.  Intake/Output from previous day: 05/16/2023 0701 - 11/05 0700 In: 3590 [P.O.:300; I.V.:960; TPN:2330] Out: 2150 [Urine:1850; Emesis/NG output:300] Intake/Output this shift: Total I/O In: 0  Out: 500 [Urine:500]  General appearance: alert, cooperative and no distress Resp: clear to auscultation bilaterally GI: soft, but distended, normal post op tenderness, no bowel sounds.  Lab Results:   Basename 04/26/12 0500 05-15-12 0423  WBC 7.6 8.8  HGB 10.3* 9.8*  HCT 31.2* 29.4*  PLT 276 229    BMET  Basename 04/26/12 0500 05/15/12 0423  NA 127* 129*  K 4.7 5.0  CL 95* 99  CO2 28 27  GLUCOSE 106* 99  BUN 22 29*  CREATININE 0.63 0.74  CALCIUM 9.4 9.4   PT/INR No results found for this basename: LABPROT:2,INR:2 in the last 72 hours   Lab 2012/05/15 0423 04/21/12 0550  AST 12 21  ALT 9 13  ALKPHOS 73 73  BILITOT 0.2* 0.2*  PROT 5.6* 5.9*  ALBUMIN 2.1* 2.6*     Lipase     Component Value Date/Time   LIPASE 19 11/05/2007 1455     Studies/Results: Dg Abd 2 Views  05/15/12  *RADIOLOGY REPORT*  Clinical Data: Postoperative ileus, distended abdomen  ABDOMEN - 2 VIEW  Comparison: 04/18/2012  Findings: The previously seen nasogastric catheter has been removed in the interval.  No free air is identified.  Scattered large and small bowel gas is seen.  Postoperative changes are noted.  IMPRESSION: No evidence of free air or significant obstructive change. Postsurgical changes are  noted.   Original Report Authenticated By: Alcide Clever, M.D.     Medications:    . heparin  5,000 Units Subcutaneous Q8H  . lip balm  1 application Topical BID  . pantoprazole (PROTONIX) IV  40 mg Intravenous QHS    Assessment/Plan Small bowel obstruction S/P exploratory lap and lysis of adhesions, 04/23/12 Dr. Daphine Deutscher.  Hypokalemia: likely due to NG output  Tobacco use  Malnutrition   Plan: Recheck her film, mobilize, keep on suction, I got 100 ml with fixing sump, side, and it appears to be working well.  Ice chips, and watch. Some bloody appearing drainage in drain i will increase ppi.     LOS: 15 days    Amber Rowland 04/26/2012

## 2012-04-26 NOTE — Progress Notes (Signed)
General Surgery [facility]  Agree with attached.  Correct electrolyte abnormalities.  Continue NG decompression.  Await resolution of post op ileus.  Velora Heckler, MD, [facility] Office: [phone]

## 2012-04-26 NOTE — Progress Notes (Signed)
PARENTERAL NUTRITION CONSULT NOTE   Pharmacy Consult for TNA Indication: SBO  No Known Allergies  Patient Measurements: Height: 4\' 9"  (144.8 cm) Weight: 85 lb (38.556 kg) IBW/kg (Calculated) : 38.6    Vital Signs: Temp: 97.7 F (36.5 C) (11/05 0544) Temp src: Oral (11/05 0544) BP: 119/68 mmHg (11/05 0709) Pulse Rate: 66  (11/05 0544) Intake/Output from previous day: 11/04 0701 - 11/05 0700 In: 3590 [P.O.:300; I.V.:960; TPN:2330] Out: 2150 [Urine:1850; Emesis/NG output:300]  Labs:  [facility] 04/26/12 0500 04/25/12 0423  WBC 7.6 8.8  HGB 10.3* 9.8*  HCT 31.2* 29.4*  PLT 276 229  APTT -- --  INR -- --     Basename 04/26/12 0500 04/25/12 0423 04/24/12 0900  NA 127* 129* 130*  K 4.7 5.0 5.3*  CL 95* 99 100  CO2 28 27 25   GLUCOSE 106* 99 108*  BUN 22 29* 33*  CREATININE 0.63 0.74 0.74  LABCREA -- -- --  CREAT24HRUR -- -- --  CALCIUM 9.4 9.4 9.2  MG -- 1.9 --  PHOS -- 3.2 --  PROT -- 5.6* --  ALBUMIN -- 2.1* --  AST -- 12 --  ALT -- 9 --  ALKPHOS -- 73 --  BILITOT -- 0.2* --  BILIDIR -- -- --  IBILI -- -- --  PREALBUMIN -- 11.9* --  TRIG -- 42 --  CHOLHDL -- -- --  CHOL -- 115 --  11/5 Corrected Ca = 10.9 Estimated Creatinine Clearance: 36.5 ml/min (by C-G formula based on Cr of 0.63).    Insulin Requirements in the past 24 hours:  CBGs < 150, No insulin ordered  Nutritional Goals:  RD recs (10/27): 1300-1500 kcal and 60-80 g protein per day Clinimix E 5/15  at a goal rate of 55ml/hr + IVFE 20% at 81ml/hr on MWF to provide: 84 g/day protein, 1398 Kcal/day avg. (1193 Kcal/day MWF, 1672 Kcal/day STTHS).  Current Nutrition:  NPO starting 11/4  Clinimix-E 5/15 at 70 mL/hr Fat Emulsion 20% at 10 mL/hr MWF  mIVF: NS @ 40 ml/hr  Assessment: 78 YOF admitted 10/21 w/ abdominal pain, vomiting and not tolerating POs. Found to have SBO and tx non-sugically w/ NG decompression, failing to progress . Has been NPO since admission. TNA started 10/26.  Consented  to surgery 11/2 and was taken to OR 11/2 at ~2200 for exploratory laparotomy with singleband enteral lysis.  Pt not tolerating much PO, made NPO 11/4.  Copious NG output.  Continue TNA per Surgery. Pt is currently meeting 100% of nutritional goals via TNA.     Electrolytes: Na low and dropping (unable to adjust in TNA) - defer to MD for IVF adjustment, other lytes ok.  CBG: controlled (< 150 mg/dL) on no insulin  Renal and hepatic function: wnl/stable   TGs and cholesterol: wnl 11/4  Prealbumin: 11.5 (10/27), 12.8 (10/28), 11.9 (11/4)   Plan:   Continue Clinimix-E 5/15 at goal rate of 70 mL/hr  Fat Emulsion 20% at 10 mL/hr on MWF only (due to national shortage)  Multivitamins and trace elements on MWF only (due to [employer])  F/u CBGs and add SSI insulin if needed                              Full TNA labs on Mondays and Thursdays   Geoffry Paradise, PharmD, BCPS Pager: [phone] 9:32 AM Pharmacy #: [id number]

## 2012-04-27 ENCOUNTER — Encounter (HOSPITAL_COMMUNITY): Payer: Self-pay | Admitting: Internal Medicine

## 2012-04-27 DIAGNOSIS — R918 Other nonspecific abnormal finding of lung field: Secondary | ICD-10-CM

## 2012-04-27 DIAGNOSIS — K56609 Unspecified intestinal obstruction, unspecified as to partial versus complete obstruction: Secondary | ICD-10-CM

## 2012-04-27 DIAGNOSIS — E871 Hypo-osmolality and hyponatremia: Secondary | ICD-10-CM

## 2012-04-27 DIAGNOSIS — I10 Essential (primary) hypertension: Secondary | ICD-10-CM

## 2012-04-27 LAB — COMPREHENSIVE METABOLIC PANEL
Albumin: 2 g/dL — ABNORMAL LOW (ref 3.5–5.2)
Alkaline Phosphatase: 70 U/L (ref 39–117)
BUN: 19 mg/dL (ref 6–23)
CO2: 32 mEq/L (ref 19–32)
Chloride: 96 mEq/L (ref 96–112)
GFR calc non Af Amer: >90 mL/min (ref >90)
Potassium: 4.1 mEq/L (ref 3.5–5.1)
Total Bilirubin: 0.1 mg/dL — ABNORMAL LOW (ref 0.3–1.2)

## 2012-04-27 LAB — GLUCOSE, CAPILLARY: Glucose-Capillary: 111 mg/dL — ABNORMAL HIGH (ref 70–99)

## 2012-04-27 MED ORDER — TRACE MINERALS CR-CU-F-FE-I-MN-MO-SE-ZN IV SOLN
INTRAVENOUS | Status: AC
  Administered 2012-04-27: 17:00:00 via INTRAVENOUS
  Filled 2012-04-27: qty 2000

## 2012-04-27 MED ORDER — FAT EMULSION 20 % IV EMUL
240.0000 mL | INTRAVENOUS | Status: AC
  Administered 2012-04-27: 240 mL via INTRAVENOUS
  Filled 2012-04-27: qty 250

## 2012-04-27 NOTE — Progress Notes (Signed)
General Surgery [facility]  Patient seen and examined.  Abdominal wound intact with retentions.  Few bowel sounds present.  Bilious in NG.  Encouraged ambulation.  Await resolution of ileus.  Allow ice chips.  Hyponatremia worse.  Will ask medical service to assist in management.  Velora Heckler, MD, [facility] Office: [phone]

## 2012-04-27 NOTE — Progress Notes (Signed)
4 Days Post-Op  Subjective: Remains distended, NG functioning.  Objective: Vital signs in last 24 hours: Temp:  [98.1 F (36.7 C)-98.8 F (37.1 C)] 98.1 F (36.7 C) (11/06 0431) Pulse Rate:  [63-74] 64  (11/06 0431) Resp:  [16] 16  (11/06 0431) BP: (118-144)/(62-71) 124/71 mmHg (11/06 0431) SpO2:  [94 %-95 %] 95 % (11/06 0431) Last BM Date: 04/21/12  Intake/Output from previous day: 11/05 0701 - 11/06 0700 In: 3259 [I.V.:960; NG/GT:500; TPN:1799] Out: 2600 [Urine:2400; Emesis/NG output:200] Intake/Output this shift:    General appearance: alert, cooperative, appears stated age and mild distress Chest: CTA bilaterally Cardiac: RRR Abdomen: remains distended, tympanic, No BS, no flatus, No N/V. Labs: pending  (Hyponatremic asymptomatic, likely secondary to TNA) VSS,afebrile  Lab Results:   Basename 04/26/12 0500 10-May-2012 0423  WBC 7.6 8.8  HGB 10.3* 9.8*  HCT 31.2* 29.4*  PLT 276 229   BMET  Basename 04/26/12 0500 May 10, 2012 0423  NA 127* 129*  K 4.7 5.0  CL 95* 99  CO2 28 27  GLUCOSE 106* 99  BUN 22 29*  CREATININE 0.63 0.74  CALCIUM 9.4 9.4   PT/INR No results found for this basename: LABPROT:2,INR:2 in the last 72 hours ABG No results found for this basename: PHART:2,PCO2:2,PO2:2,HCO3:2 in the last 72 hours  Studies/Results: Dg Abd 2 Views  2012/05/10  *RADIOLOGY REPORT*  Clinical Data: Postoperative ileus, distended abdomen  ABDOMEN - 2 VIEW  Comparison: 04/18/2012  Findings: The previously seen nasogastric catheter has been removed in the interval.  No free air is identified.  Scattered large and small bowel gas is seen.  Postoperative changes are noted.  IMPRESSION: No evidence of free air or significant obstructive change. Postsurgical changes are noted.   Original Report Authenticated By: Alcide Clever, M.D.    Dg Abd Portable 1v  04/26/2012  *RADIOLOGY REPORT*  Clinical Data: Abdominal distension, NG tube placed  PORTABLE ABDOMEN - 1 VIEW  Comparison:  2012/05/10  Findings: Nonspecific gaseous distension of loops of large and small bowel.  NG tube descends into the abdomen, tip projecting over the left upper quadrant.  Cannot exclude free air on this supine radiograph.  Surgical clips and tubes project over the midline abdomen.  IMPRESSION: NG tube tip projects over the stomach.   Original Report Authenticated By: Jearld Lesch, M.D.     Anti-infectives: Anti-infectives     Start     Dose/Rate Route Frequency Ordered Stop   04/12/12 0445   cefTRIAXone (ROCEPHIN) 1 g in dextrose 5 % 50 mL IVPB        1 g 100 mL/hr over 30 Minutes Intravenous  Once 04/12/12 0435 04/12/12 0615          Assessment/Plan: s/p Procedure(s) (LRB) with comments: EXPLORATORY LAPAROTOMY (N/A) - enterolysis Small bowel obstruction S/P exploratory lap and lysis of adhesions, 04/23/12 Dr. Daphine Deutscher.  Hypokalemia: likely due to NG output, TNA Tobacco use  Malnutrition Continue NPO, NG tube Will ask medicine team to see for hyponatremia issues. Mobilize   LOS: 16 days    Blenda Mounts [facility] Pager # [phone]  04/27/2012

## 2012-04-27 NOTE — Consult Note (Signed)
[facility] Medical Consultation  Amber Rowland ZOX:[id number] DOB: 24-Nov-1933 DOA: 04/11/2012 PCP: No primary provider on file.   Requesting physician: Dr. Darnell Level Date of consultation: 04/27/2012 Reason for consultation: Hyponatremia  Impression/Recommendations Principal Problem:  *SBO (small bowel obstruction) Active Problems:  UTI (lower urinary tract infection)  Chronic constipation  COPD (chronic obstructive pulmonary disease)  Tobacco abuse  HTN (hypertension)  Benign breast cysts in female  Anxiety  Lung nodules, ?incidental  Hyponatremia, likely hypovolemic hyponatremia due to high insensible losses and continuous loss of electrolytes and fluid via NG tube.  Patient has skin tenting and dry lips and mucous membranes.  May improve with more aggressive hydration with isotonic saline.  Trended up slightly today but still hyponatremic -  Daily weights -  Strict I/O -  Increase NS to 146ml/h overnight which will increase her FL by about 1.2L today, and recheck electrolytes and fluid status tomorrow.       -  If euvolemic and persistently hyponatremic, then will send serum and urine osms and urine sodium.       -  If still hypovolemic, then will increase IVF again.     SBO, patient continues to have bilious drainage from her NG tube and distended abdomen.   -  Agree with excellent management by surgery team -  Continue TPN  Hypertension, blood pressure stable.   COPD, stable.  If wheezing, consider albuterol as needed. Pulmonary nodule, 4mm:  Will need follow up in 1 year - primary care doctor may arrange. Atrophy of left kidney, creatinine stable.  Minimize nephrotoxic agents  DIET:  NPO with TPN at 54ml/h ACCESS:  NG and PICC IVF:  Increase to NS at 131ml/h PROPH:  SCDs  I will followup again tomorrow. Please contact me if I can be of assistance in the meanwhile. Thank you for this consultation.  Chief Complaint: abdominal pain  HPI:   The patient is  a 78 year old female with history of HTN, COPD, left kidney atrophy, recurrent UTI who [facility]  [facility] ER on 10/21 with abdominal distension and pain.  CT scan of the abdomen and pelvis demonstrated small bowel obstruction with a small about of ascites.  She was evaluated by surgery who made her NPO with NG tube.  She was started on TPN on 10/26.  She failed clamping of her NG tube and did not have improvement in bowel function despite extended bowel rest.  She was taken to the OR on 11/2 and found to have an area of small bowel which was obstructed by a band which was relieved.  She was started on a diet, however, she had nausea and abdominal distension so she was again made NPO with NG to low intermittent suction.  She continues to have copious bilious drainage from her NG tube.  Because of her sodium trending down, her fluids were changed from 0.45 NS to NS on 11/3.  Her sodium continued to trend down to 127 on 11/6.  She appears to be approximately 6L positive since admission based on flow sheets, but she has not been weighed recently.    Review of Systems:  Patient denies fevers, chills, changes in hearing and vision, sore throat.  She has dry lips, but the lip balm makes her feel sick to her stomach.  She has frequent belching, but has NG to suction and has not thrown up.  She denies chest pain, shortness of breath, cough.  States  her abdominal distension is about the same as before and it feels uncomfortable.  Denies dysuria, frequency, and states she is voiding regularly.  Denies lower extremity edema.  Denies lightheadedness, focal weakness.  States she feels very sore in her abdomen with ambulation and requests ambulating fewer times per day.    Past Medical History  Diagnosis Date  . Glaucoma (increased eye pressure)   . Ovarian cyst, right 2009    s/p BSO 2009: OVARIAN FIBROTHECOMA in multiple fluid filled cysts  4.5cm region  . Atrophy of  left kidney     Chronic atrophy of the left kidney.  . Chronic constipation   . COPD (chronic obstructive pulmonary disease)     Hyperinflation and heavy smoking history strongly  . HTN (hypertension) 04/12/2012  . Benign breast cysts in female 04/12/2012    Seen on mammography 2009   . UTI (lower urinary tract infection)     History of chronic recurrent UTIs   Past Surgical History  Procedure Date  . Bilateral oophorectomy 2009    PROCEDURE:  diagnostic laparoscopy, laparotomy with bilateral salpingo-  . Laparotomy 04/23/2012    Procedure: EXPLORATORY LAPAROTOMY;  Surgeon: Valarie Merino, MD;  Location: [facility]S;  Service: General;  Laterality: N/A;  enterolysis    Social History:  reports that she has been smoking Cigarettes.  She has a 48 pack-year smoking history. She has never used smokeless tobacco. She reports that she does not drink alcohol or use illicit drugs.  No Known Allergies Family History  Problem Relation Age of Onset  . Diabetes Son   . High blood pressure Son   . Coronary artery disease Son     Prior to Admission medications   Medication Sig Start Date End Date Taking? Authorizing Provider  aspirin 325 MG tablet Take 650 mg by mouth daily. Pain   Yes Historical Provider, MD   Physical Exam: Blood pressure 124/71, pulse 64, temperature 98.1 F (36.7 C), temperature source Oral, resp. rate 16, height 4\' 9"  (1.448 m), weight 38.556 kg (85 lb), SpO2 95.00%. Filed Vitals:   04/26/12 0709 04/26/12 1414 04/26/12 2116 04/27/12 0431  BP: 119/68 144/70 118/62 124/71  Pulse:  74 63 64  Temp:  98.3 F (36.8 C) 98.8 F (37.1 C) 98.1 F (36.7 C)  TempSrc:  Oral Oral   Resp:  16 16 16   Height:      Weight:      SpO2:  94% 95% 95%     General:  Cachectic caucasian female, no acute distress, lying in bed with NG to wall suction with bilious material draining.  Container almost full  Eyes: PERRL, anicteric, noninjected,  ENT: NCAT, NG in place, dry lips.  Moist  mucous membranes after eating ice chip  Neck:  Supple, no thyromegaly  Lymph:  no cervical or supraclavicular LN  Cardiovascular:  RRR, 3/6 murmur at the LSB, pectus excavatum, 2+ peripheral pulses  Respiratory: CTAB  Abdomen:  BS + while suction active, distended, dressing in place over midline with small amount of brown serous drainage, mildly tender diffusely without rebound or guarding  Skin: Dry with skin tenting  Musculoskeletal: Decreased tone and bulk, no LEE  Psychiatric: A&Ox4  Neurologic: III-XII grossly intact, strength 4/5 throughout  Labs on Admission:  Basic Metabolic Panel:  Lab 04/27/12 6387 04/26/12 0500 04/25/12 0423 04/24/12 0900 04/23/12 0415 04/21/12 0550  NA 133* 127* 129* 130* 130* --  K 4.1 4.7 5.0 5.3* 4.6 --  CL 96 95*  99 100 96 --  CO2 32 28 27 25 27  --  GLUCOSE 107* 106* 99 108* 104* --  BUN 19 22 29* 33* 27* --  CREATININE 0.52 0.63 0.74 0.74 0.71 --  CALCIUM 9.0 9.4 9.4 9.2 9.4 --  MG -- -- 1.9 -- -- 2.1  PHOS -- -- 3.2 -- -- 3.6   Liver Function Tests:  Lab 04/27/12 1200 04/25/12 0423 04/21/12 0550  AST 13 12 21   ALT 7 9 13   ALKPHOS 70 73 73  BILITOT 0.1* 0.2* 0.2*  PROT 5.4* 5.6* 5.9*  ALBUMIN 2.0* 2.1* 2.6*   No results found for this basename: LIPASE:5,AMYLASE:5 in the last 168 hours No results found for this basename: AMMONIA:5 in the last 168 hours CBC:  Lab 04/26/12 0500 04/25/12 0423 04/23/12 0415  WBC 7.6 8.8 10.9*  NEUTROABS -- 6.7 8.9*  HGB 10.3* 9.8* 11.1*  HCT 31.2* 29.4* 33.0*  MCV 92.9 93.3 92.4  PLT 276 229 233   Cardiac Enzymes: No results found for this basename: CKTOTAL:5,CKMB:5,CKMBINDEX:5,TROPONINI:5 in the last 168 hours BNP: No components found with this basename: POCBNP:5 CBG:  Lab 04/27/12 1233 04/27/12 0426 04/26/12 2002 04/26/12 1144 04/26/12 0552  GLUCAP 111* 117* 128* 112* 107*    Radiological Exams on Admission: Dg Abd Portable 1v  04/26/2012  *RADIOLOGY REPORT*  Clinical Data: Abdominal  distension, NG tube placed  PORTABLE ABDOMEN - 1 VIEW  Comparison: 04/25/2012  Findings: Nonspecific gaseous distension of loops of large and small bowel.  NG tube descends into the abdomen, tip projecting over the left upper quadrant.  Cannot exclude free air on this supine radiograph.  Surgical clips and tubes project over the midline abdomen.  IMPRESSION: NG tube tip projects over the stomach.   Original Report Authenticated By: Jearld Lesch, M.D.     EKG: None done  Time spent: 45 min  Renae Fickle [facility] Pager [phone]  If 7PM-7AM, please contact night-coverage [email or web address] Password [email or web address] 04/27/2012, 12:40 PM

## 2012-04-27 NOTE — Progress Notes (Signed)
PARENTERAL NUTRITION CONSULT NOTE   Pharmacy Consult for TNA Indication: SBO  No Known Allergies  Patient Measurements: Height: 4\' 9"  (144.8 cm) Weight: 85 lb (38.556 kg) IBW/kg (Calculated) : 38.6    Vital Signs: Temp: 98.1 F (36.7 C) (11/06 0431) Temp src: Oral (11/05 2116) BP: 124/71 mmHg (11/06 0431) Pulse Rate: 64  (11/06 0431) Intake/Output from previous day: 11/05 0701 - 11/06 0700 In: 3259 [I.V.:960; NG/GT:500; TPN:1799] Out: 2600 [Urine:2400; Emesis/NG output:200]  Labs:  [facility] 04/26/12 0500 04/25/12 0423  WBC 7.6 8.8  HGB 10.3* 9.8*  HCT 31.2* 29.4*  PLT 276 229  APTT -- --  INR -- --     Basename 04/26/12 0500 04/25/12 0423 04/24/12 0900  NA 127* 129* 130*  K 4.7 5.0 5.3*  CL 95* 99 100  CO2 28 27 25   GLUCOSE 106* 99 108*  BUN 22 29* 33*  CREATININE 0.63 0.74 0.74  LABCREA -- -- --  CREAT24HRUR -- -- --  CALCIUM 9.4 9.4 9.2  MG -- 1.9 --  PHOS -- 3.2 --  PROT -- 5.6* --  ALBUMIN -- 2.1* --  AST -- 12 --  ALT -- 9 --  ALKPHOS -- 73 --  BILITOT -- 0.2* --  BILIDIR -- -- --  IBILI -- -- --  PREALBUMIN -- 11.9* --  TRIG -- 42 --  CHOLHDL -- -- --  CHOL -- 115 --  11/5 Corrected Ca = 10.9 Estimated Creatinine Clearance: 36.5 ml/min (by C-G formula based on Cr of 0.63).    Insulin Requirements in the past 24 hours:  CBGs < 150, No insulin ordered  Nutritional Goals:  RD recs (10/27): 1300-1500 kcal and 60-80 g protein per day Clinimix E 5/15  at a goal rate of 26ml/hr + IVFE 20% at 14ml/hr on MWF to provide: 84 g/day protein, 1398 Kcal/day avg. (1193 Kcal/day MWF, 1672 Kcal/day STTHS).  Current Nutrition:  NPO starting 11/4  Clinimix-E 5/15 at 70 mL/hr Fat Emulsion 20% at 10 mL/hr MWF  mIVF: NS @ 40 ml/hr  Assessment: Amber Rowland admitted 10/21 w/ abdominal pain, vomiting and not tolerating POs. Found to have SBO and tx non-sugically w/ NG decompression, failing to progress . Has been NPO since admission. TNA started 10/26.   Consented to surgery 11/2 and was taken to OR 11/2 at ~2200 for exploratory laparotomy with singleband enteral lysis.  Pt not tolerating much PO, made NPO 11/4. NG output improving.  Continue TNA per Surgery. Pt is currently meeting 100% of nutritional goals via TNA.  Awaiting resolution of post-op ileus.   No new labs 11/6   Electrolytes: Na low and dropping (unable to adjust in TNA) - defer to MD for IVF adjustment, other lytes ok.  CBG: controlled (< 150 mg/dL) on no insulin  Renal and hepatic function: wnl/stable   TGs and cholesterol: wnl 11/4  Prealbumin: 11.5 (10/27), 12.8 (10/28), 11.9 (11/4)   Plan:   Continue Clinimix-E 5/15 at goal rate of 70 mL/hr  Fat Emulsion 20% at 10 mL/hr on MWF only (due to national shortage)  Multivitamins and trace elements on MWF only (due to [employer])  F/u CBGs and add SSI insulin if needed                              Full TNA labs on Mondays and Thursdays   Geoffry Paradise, PharmD, BCPS Pager: [phone] 8:53 AM Pharmacy #: [phone]

## 2012-04-28 ENCOUNTER — Inpatient Hospital Stay (HOSPITAL_COMMUNITY): Payer: Medicare Other

## 2012-04-28 ENCOUNTER — Encounter (HOSPITAL_COMMUNITY): Payer: Self-pay | Admitting: General Surgery

## 2012-04-28 DIAGNOSIS — E46 Unspecified protein-calorie malnutrition: Secondary | ICD-10-CM

## 2012-04-28 DIAGNOSIS — K567 Ileus, unspecified: Secondary | ICD-10-CM

## 2012-04-28 HISTORY — DX: Ileus, unspecified: K56.7

## 2012-04-28 HISTORY — DX: Unspecified protein-calorie malnutrition: E46

## 2012-04-28 LAB — COMPREHENSIVE METABOLIC PANEL
ALT: 8 U/L (ref 0–35)
AST: 14 U/L (ref 0–37)
Albumin: 2.2 g/dL — ABNORMAL LOW (ref 3.5–5.2)
Alkaline Phosphatase: 76 U/L (ref 39–117)
BUN: 18 mg/dL (ref 6–23)
CO2: 31 mEq/L (ref 19–32)
Calcium: 9.2 mg/dL (ref 8.4–10.5)
Chloride: 97 mEq/L (ref 96–112)
Creatinine, Ser: 0.55 mg/dL (ref 0.50–1.10)
GFR calc Af Amer: >90 mL/min (ref >90)
GFR calc non Af Amer: 89 mL/min — ABNORMAL LOW (ref >90)
Glucose, Bld: 107 mg/dL — ABNORMAL HIGH (ref 70–99)
Potassium: 3.9 mEq/L (ref 3.5–5.1)
Sodium: 134 mEq/L — ABNORMAL LOW (ref 135–145)
Total Bilirubin: 0.1 mg/dL — ABNORMAL LOW (ref 0.3–1.2)
Total Protein: 5.9 g/dL — ABNORMAL LOW (ref 6.0–8.3)

## 2012-04-28 LAB — PHOSPHORUS: Phosphorus: 3.4 mg/dL (ref 2.3–4.6)

## 2012-04-28 LAB — GLUCOSE, CAPILLARY: Glucose-Capillary: 118 mg/dL — ABNORMAL HIGH (ref 70–99)

## 2012-04-28 LAB — MAGNESIUM: Magnesium: 1.9 mg/dL (ref 1.5–2.5)

## 2012-04-28 MED ORDER — HYDROMORPHONE HCL PF 1 MG/ML IJ SOLN
0.5000 mg | INTRAMUSCULAR | Status: DC | PRN
  Administered 2012-04-28 – 2012-04-29 (×10): 0.75 mg via INTRAVENOUS
  Administered 2012-04-30 (×4): 0.5 mg via INTRAVENOUS
  Administered 2012-04-30: 0.75 mg via INTRAVENOUS
  Administered 2012-04-30 – 2012-05-01 (×9): 0.5 mg via INTRAVENOUS
  Administered 2012-05-02: 0.75 mg via INTRAVENOUS
  Administered 2012-05-02: 0.5 mg via INTRAVENOUS
  Administered 2012-05-02: 0.75 mg via INTRAVENOUS
  Administered 2012-05-02 (×2): 0.5 mg via INTRAVENOUS
  Administered 2012-05-03 – 2012-05-05 (×12): 0.75 mg via INTRAVENOUS
  Administered 2012-05-06: 0.5 mg via INTRAVENOUS
  Administered 2012-05-06: 0.75 mg via INTRAVENOUS
  Filled 2012-04-28 (×43): qty 1

## 2012-04-28 MED ORDER — CLINIMIX E/DEXTROSE (5/15) 5 % IV SOLN
INTRAVENOUS | Status: AC
  Administered 2012-04-28: 17:00:00 via INTRAVENOUS
  Filled 2012-04-28: qty 2000

## 2012-04-28 MED ORDER — HYDRALAZINE HCL 20 MG/ML IJ SOLN
10.0000 mg | INTRAMUSCULAR | Status: DC | PRN
  Administered 2012-05-04: 10 mg via INTRAVENOUS
  Filled 2012-04-28 (×2): qty 1

## 2012-04-28 NOTE — Progress Notes (Addendum)
[facility] PROGRESS NOTE  Amber Rowland WGN:[id number] DOB: [birth year] DOA: 04/11/2012 PCP: No primary provider on file.  Assessment/Plan:  Hyponatremia, likely hypovolemic hyponatremia due to high insensible losses and continuous loss of electrolytes and fluid via NG tube. Patient has less skin tenting and skin and lips appear more moist.  Sodium continues to rise.  No evidence of edema or pulmonary edema on exam.   -  Was approximately even based on flow sheet yesterday, but is now 2.5L positive today -  Continue NS to 163ml/h  -  Will reassess fluid status again tomorrow -  Recheck BMP in 2 days.  SBO, patient continues to have bilious drainage from her NG tube and distended abdomen.  - Agree with excellent management by surgery team  - Continue TPN   Hypertension, blood pressure elevated earlier today while NG tube was being manipulated.  -  Add hydralazine as needed for severe HTN -  Trend blood pressure  As before: COPD, stable. If wheezing, consider albuterol as needed.  Pulmonary nodule, 4mm: Will need follow up in 1 year - primary care doctor may arrange.  Atrophy of left kidney, creatinine stable. Minimize nephrotoxic agents   DIET: NPO with TPN at 71ml/h  ACCESS: NG and PICC  IVF:  NS at 178ml/h  PROPH: SCDs   I will followup again tomorrow. Please contact me if I can be of assistance in the meanwhile. Thank you for this consultation.   HPI/Subjective:  Patient states that she had a lot of discomfort and felt Ruvim Risko of breath while having NG tube manipulated this morning.  She states her abdomen feels about the same and is equally distended.  Denies SOB and chest pain and edema.  Has persistent nausea.    Objective: Filed Vitals:   04/28/12 0439 04/28/12 1029 04/28/12 1300 04/28/12 1410  BP: 194/83 186/84 175/88 138/78  Pulse: 78 75  68  Temp: 98.5 F (36.9 C) 98.8 F (37.1 C)  99 F (37.2 C)  TempSrc: Oral Oral  Oral  Resp: 16 20    Height:        Weight:      SpO2: 95% 94%  95%    Intake/Output Summary (Last 24 hours) at 04/28/12 1740 Last data filed at 04/28/12 1500  Gross per 24 hour  Intake 4518.16 ml  Output   2650 ml  Net 1868.16 ml   Filed Weights   04/12/12 1100  Weight: 38.556 kg (85 lb)    Exam:  General: Cachectic caucasian female, no acute distress, lying in bed with NG to wall suction with bilious/bloody material draining.  ENT: NG in place, lips less dry. Moist mucous membranes Cardiovascular: RRR, 3/6 murmur at the LSB, pectus excavatum, 2+ peripheral pulses  Respiratory: CTAB, no increased work of breathing, no rales Abdomen:  Absent BS today, distended, dressing in place over midline c/d/i, mildly tender diffusely without rebound or guarding  Skin:  Only mildly dry with persistent skin tenting  Musculoskeletal: Decreased tone and bulk, no LEE     Data Reviewed: Basic Metabolic Panel:  Lab 04/28/12 7846 04/27/12 1200 04/26/12 0500 04/25/12 0423 04/24/12 0900  NA 134* 133* 127* 129* 130*  K 3.9 4.1 4.7 5.0 5.3*  CL 97 96 95* 99 100  CO2 31 32 28 27 25   GLUCOSE 107* 107* 106* 99 108*  BUN 18 19 22  29* 33*  CREATININE 0.55 0.52 0.63 0.74 0.74  CALCIUM 9.2 9.0 9.4 9.4 9.2  MG 1.9 -- --  1.9 --  PHOS 3.4 -- -- 3.2 --   Liver Function Tests:  Lab 04/28/12 0505 04/27/12 1200 04/25/12 0423  AST 14 13 12   ALT 8 7 9   ALKPHOS 76 70 73  BILITOT 0.1* 0.1* 0.2*  PROT 5.9* 5.4* 5.6*  ALBUMIN 2.2* 2.0* 2.1*   No results found for this basename: LIPASE:5,AMYLASE:5 in the last 168 hours No results found for this basename: AMMONIA:5 in the last 168 hours CBC:  Lab 04/26/12 0500 04/25/12 0423 04/23/12 0415  WBC 7.6 8.8 10.9*  NEUTROABS -- 6.7 8.9*  HGB 10.3* 9.8* 11.1*  HCT 31.2* 29.4* 33.0*  MCV 92.9 93.3 92.4  PLT 276 229 233   Cardiac Enzymes: No results found for this basename: CKTOTAL:5,CKMB:5,CKMBINDEX:5,TROPONINI:5 in the last 168 hours BNP (last 3 results) No results found for this  basename: PROBNP:3 in the last 8760 hours CBG:  Lab 04/28/12 1133 04/28/12 0704 04/28/12 0049 04/27/12 1910 04/27/12 1233  GLUCAP 111* 118* 109* 112* 111*    No results found for this or any previous visit (from the past 240 hour(s)).   Studies: Dg Abd 1 View  04/28/2012  *RADIOLOGY REPORT*  Clinical Data: NG tube placement  ABDOMEN - 1 VIEW  Comparison: 04/28/2012  Findings:  Nasogastric tube tip is in the body of the stomach.  The side port is below the GE junction.  Air-filled loops of small and large bowel are again noted compatible with ileus.  The appearance is not significantly improved from previous exam.  Skin staples overlie the right lower quadrant of the abdomen.  IMPRESSION:  1.  NG tube is in satisfactory position within the stomach with side port below GE junction. 2.  No change in ileus pattern   Original Report Authenticated By: Signa Kell, M.D.    Dg Abd Portable 1v  04/28/2012  *RADIOLOGY REPORT*  Clinical Data: NG tube placement  PORTABLE ABDOMEN - 1 VIEW  Comparison: 04/26/2012  Findings: Distended loops of bowel project over the abdomen.  This is more prominent than on the prior study.  The NG tube is not visualized and is therefore above the proximal stomach. Postoperative changes are stable.  IMPRESSION: NG tube is not seen on this study and is therefore above the proximal stomach. Worsening ileus pattern.   Original Report Authenticated By: Jolaine Click, M.D.     Scheduled Meds:   . heparin  5,000 Units Subcutaneous Q8H  . lip balm  1 application Topical BID  . pantoprazole (PROTONIX) IV  40 mg Intravenous QHS   Continuous Infusions:   . sodium chloride 100 mL/hr at 04/28/12 1042  . TPN (CLINIMIX) +/- additives 70 mL/hr at 04/27/12 1722   And  . fat emulsion 240 mL (04/27/12 1722)  . [EXPIRED] TPN (CLINIMIX) +/- additives 70 mL/hr at 04/26/12 1754  . TPN (CLINIMIX) +/- additives 70 mL/hr at 04/28/12 1719    Principal Problem:  *SBO (small bowel  obstruction) Active Problems:  UTI (lower urinary tract infection)  Chronic constipation  COPD (chronic obstructive pulmonary disease)  Tobacco abuse  HTN (hypertension)  Benign breast cysts in female  Anxiety  Lung nodules, ?incidental  Hyponatremia  Malnutrition  Ileus following gastrointestinal surgery    Time spent: 30    Bacilio Abascal, [facility]  [facility] Pager [phone]. If 8PM-8AM, please contact night-coverage at [email or web address], password [facility] 04/28/2012, 5:40 PM  LOS: 17 days

## 2012-04-28 NOTE — Anesthesia Postprocedure Evaluation (Signed)
  Anesthesia Post-op Note  Patient: Amber Rowland  Procedure(s) Performed: Procedure(s) (LRB): EXPLORATORY LAPAROTOMY (N/A)  Patient Location: PACU  Anesthesia Type: General  Level of Consciousness: awake and alert   Airway and Oxygen Therapy: Patient Spontanous Breathing  Post-op Pain: mild  Post-op Assessment: Post-op Vital signs reviewed, Patient's Cardiovascular Status Stable, Respiratory Function Stable, Patent Airway and No signs of Nausea or vomiting  Post-op Vital Signs: stable  Complications: No apparent anesthesia complications

## 2012-04-28 NOTE — Progress Notes (Signed)
General Surgery [facility]  Post op ileus following lysis of adhesions.  Electrolytes improved.  Continue NG decompression and NPO status for now.  Encourage OOB, ambulation.  Velora Heckler, MD, [facility] Office: [phone]

## 2012-04-28 NOTE — Progress Notes (Signed)
5 Days Post-Op  Subjective: She is still blown up, the NG suction system does not appear to be working well.  We have even tried to switch out the suction device and it is not working.  She feels miserable, distended and depressed.  Wants to know if we have to go back in;  Objective: Vital signs in last 24 hours: Temp:  [98.4 F (36.9 C)-98.8 F (37.1 C)] 98.8 F (37.1 C) (11/07 1029) Pulse Rate:  [69-78] 75  (11/07 1029) Resp:  [16-20] 20  (11/07 1029) BP: (145-194)/(80-91) 186/84 mmHg (11/07 1029) SpO2:  [94 %-95 %] 94 % (11/07 1029) Last BM Date: 04/21/12  200 from NG, No BM recorded. Na is improving, I/O 5 liters positive.  Intake/Output from previous day: 11/06 0701 - 11/07 0700 In: 2413.3 [I.V.:1052.3; TPN:1361] Out: 2400 [Urine:2190; Emesis/NG output:210] Intake/Output this shift:    General appearance: alert, cooperative and no distress Resp: clear to auscultation bilaterally GI: distended, hypoactive bowel sounds, NG is in and functions with placing fluid and air in it.  No much flatus.  Lab Results:   Basename 04/26/12 0500  WBC 7.6  HGB 10.3*  HCT 31.2*  PLT 276    BMET  Basename 04/28/12 0505 04/27/12 1200  NA 134* 133*  K 3.9 4.1  CL 97 96  CO2 31 32  GLUCOSE 107* 107*  BUN 18 19  CREATININE 0.55 0.52  CALCIUM 9.2 9.0   PT/INR No results found for this basename: LABPROT:2,INR:2 in the last 72 hours   Lab 04/28/12 0505 04/27/12 1200 04/25/12 0423  AST 14 13 12   ALT 8 7 9   ALKPHOS 76 70 73  BILITOT 0.1* 0.1* 0.2*  PROT 5.9* 5.4* 5.6*  ALBUMIN 2.2* 2.0* 2.1*     Lipase     Component Value Date/Time   LIPASE 19 11/05/2007 1455     Studies/Results: Dg Abd Portable 1v  04/26/2012  *RADIOLOGY REPORT*  Clinical Data: Abdominal distension, NG tube placed  PORTABLE ABDOMEN - 1 VIEW  Comparison: 04/25/2012  Findings: Nonspecific gaseous distension of loops of large and small bowel.  NG tube descends into the abdomen, tip projecting over the left  upper quadrant.  Cannot exclude free air on this supine radiograph.  Surgical clips and tubes project over the midline abdomen.  IMPRESSION: NG tube tip projects over the stomach.   Original Report Authenticated By: Jearld Lesch, M.D.     Medications:    . heparin  5,000 Units Subcutaneous Q8H  . lip balm  1 application Topical BID  . pantoprazole (PROTONIX) IV  40 mg Intravenous QHS    Assessment/Plan Small bowel obstruction S/P exploratory lap and lysis of adhesions, 04/23/12 Dr. Daphine Deutscher.  Hypokalemia: likely due to NG output  Hyponatremia improving Post op ileus Tobacco use  Malnutrition/TNA prealbumin 11.9  Plan:  Recheck film, continue TNA, hyponatremia is better ,  replacing LIWS, work on mobilizing her more.         LOS: 17 days    Amber Rowland 04/28/2012

## 2012-04-28 NOTE — Progress Notes (Signed)
PARENTERAL NUTRITION CONSULT NOTE   Pharmacy Consult for TNA Indication: SBO  No Known Allergies  Patient Measurements: Height: 4\' 9"  (144.8 cm) Weight: 85 lb (38.556 kg) IBW/kg (Calculated) : 38.6    Vital Signs: Temp: 98.5 F (36.9 C) (11/07 0439) Temp src: Oral (11/07 0439) BP: 194/83 mmHg (11/07 0439) Pulse Rate: 78  (11/07 0439) Intake/Output from previous day: 11/06 0701 - 11/07 0700 In: 2413.3 [I.V.:1052.3; TPN:1361] Out: 2400 [Urine:2190; Emesis/NG output:210]  Labs:  Basename 04/26/12 0500  WBC 7.6  HGB 10.3*  HCT 31.2*  PLT 276  APTT --  INR --     Basename 04/28/12 0505 04/27/12 1200 04/26/12 0500  NA 134* 133* 127*  K 3.9 4.1 4.7  CL 97 96 95*  CO2 31 32 28  GLUCOSE 107* 107* 106*  BUN 18 19 22   CREATININE 0.55 0.52 0.63  LABCREA -- -- --  CREAT24HRUR -- -- --  CALCIUM 9.2 9.0 9.4  MG 1.9 -- --  PHOS 3.4 -- --  PROT 5.9* 5.4* --  ALBUMIN 2.2* 2.0* --  AST 14 13 --  ALT 8 7 --  ALKPHOS 76 70 --  BILITOT 0.1* 0.1* --  BILIDIR -- -- --  IBILI -- -- --  PREALBUMIN -- -- --  TRIG -- -- --  CHOLHDL -- -- --  CHOL -- -- --   Estimated Creatinine Clearance: 36.5 ml/min (by C-G formula based on Cr of 0.55).    Insulin Requirements in the past 24 hours:  CBGs < 150, No insulin ordered  Nutritional Goals:  RD recs (10/27): 1300-1500 kcal and 60-80 g protein per day Clinimix E 5/15  at a goal rate of 18ml/hr + IVFE 20% at 77ml/hr on MWF to provide: 84 g/day protein, 1398 Kcal/day avg. (1193 Kcal/day MWF, 1672 Kcal/day STTHS).  Current Nutrition:  NPO starting 11/4  Clinimix-E 5/15 at 70 mL/hr Fat Emulsion 20% at 10 mL/hr MWF  mIVF: NS @ 100 ml/hr (advanced 11/6pm by MD)  Assessment: 78 YOF admitted 10/21 w/ abdominal pain, vomiting and not tolerating POs. Found to have SBO and tx non-sugically w/ NG decompression, failing to progress . Has been NPO since admission. TNA started 10/26.  Consented to surgery 11/2 and was taken to OR 11/2 at  ~2200 for exploratory laparotomy with singleband enteral lysis. Looks like patient is s/p Ex-lap again 11/7am per anesthesia post-op note (no surgery MD note yet today)  Pt not tolerating much PO, made NPO 11/4. NG output improving.  Continue TNA per Surgery. Pt is currently meeting 100% of nutritional goals via TNA.  Awaiting resolution of post-op ileus.    Electrolytes: Na low (unable to adjust in TNA). MD increased IVF to 100 ml/hr 11/6pm - will defer further fluid rate changes to MD. Other lytes wnl.  CBG: controlled (< 150 mg/dL) on no insulin  Renal and hepatic function: wnl/stable   TGs and cholesterol: wnl 11/4  Prealbumin: 11.5 (10/27), 12.8 (10/28), 11.9 (11/4)   Plan:   Continue Clinimix-E 5/15 at goal rate of 70 mL/hr  Fat Emulsion 20% at 10 mL/hr on MWF only (due to Sport and exercise psychologist)  Multivitamins and trace elements on MWF only (due to Sport and exercise psychologist)  Further IVF rate adjustment per MD (currently receiving 126ml/hr NS + 2ml/hr TNA + 39ml/hr IV lipids = 124ml/hr fluid total)                            Full  TNA labs on Mondays and Thursdays   .Darrol Angel, PharmD Pager: [phone] 04/28/2012 8:21 AM

## 2012-04-28 NOTE — Progress Notes (Signed)
Pt ambulated in the hall. When getting back in bed, pt told student nurse "The tube is in my throat." The nurse found the NG tube in the patient's lap upon entering the room. Sherrie George, PA notified. Orders to place a new NG tube and order an x-ray to verify placement received and performed. Will continue to monitor pt.

## 2012-04-28 NOTE — Progress Notes (Signed)
Pt transferred to 5W general surgery, room 1538. Report given to Florentina Addison, Charity fundraiser.

## 2012-04-29 ENCOUNTER — Inpatient Hospital Stay (HOSPITAL_COMMUNITY): Payer: Medicare Other

## 2012-04-29 DIAGNOSIS — K929 Disease of digestive system, unspecified: Secondary | ICD-10-CM

## 2012-04-29 DIAGNOSIS — J449 Chronic obstructive pulmonary disease, unspecified: Secondary | ICD-10-CM

## 2012-04-29 LAB — CBC
HCT: 28.5 % — ABNORMAL LOW (ref 36.0–46.0)
Hemoglobin: 9.4 g/dL — ABNORMAL LOW (ref 12.0–15.0)
MCH: 30.5 pg (ref 26.0–34.0)
MCV: 92.5 fL (ref 78.0–100.0)
RBC: 3.08 MIL/uL — ABNORMAL LOW (ref 3.87–5.11)
WBC: 6.5 10*3/uL (ref 4.0–10.5)

## 2012-04-29 LAB — GLUCOSE, CAPILLARY
Glucose-Capillary: 99 mg/dL (ref 70–99)
Glucose-Capillary: 109 mg/dL — ABNORMAL HIGH (ref 70–99)

## 2012-04-29 LAB — PROTIME-INR
INR: 1.05 (ref 0.00–1.49)
Prothrombin Time: 13.6 seconds (ref 11.6–15.2)

## 2012-04-29 MED ORDER — TRACE MINERALS CR-CU-F-FE-I-MN-MO-SE-ZN IV SOLN
INTRAVENOUS | Status: AC
  Administered 2012-04-29: 18:00:00 via INTRAVENOUS
  Filled 2012-04-29: qty 2000

## 2012-04-29 MED ORDER — FAT EMULSION 20 % IV EMUL
240.0000 mL | INTRAVENOUS | Status: AC
  Administered 2012-04-29: 240 mL via INTRAVENOUS
  Filled 2012-04-29: qty 250

## 2012-04-29 NOTE — Progress Notes (Signed)
PARENTERAL NUTRITION CONSULT NOTE   Pharmacy Consult for TNA Indication: SBO  No Known Allergies  Patient Measurements: Height: 4\' 9"  (144.8 cm) Weight: 87 lb 6.4 oz (39.644 kg) IBW/kg (Calculated) : 38.6   Vital Signs: Temp: 97.8 F (36.6 C) (11/08 0535) Temp src: Oral (11/08 0535) BP: 104/47 mmHg (11/08 0535) Pulse Rate: 68  (11/08 0535) Intake/Output from previous day: 11/07 0701 - 11/08 0700 In: 3599 [I.V.:2020; TPN:1579] Out: 2750 [Urine:2300; Emesis/NG output:450]  Labs:  [facility] 04/29/12 0415  WBC 6.5  HGB 9.4*  HCT 28.5*  PLT 282  APTT --  INR --     Basename 04/28/12 0505 04/27/12 1200  NA 134* 133*  K 3.9 4.1  CL 97 96  CO2 31 32  GLUCOSE 107* 107*  BUN 18 19  CREATININE 0.55 0.52  LABCREA -- --  CREAT24HRUR -- --  CALCIUM 9.2 9.0  MG 1.9 --  PHOS 3.4 --  PROT 5.9* 5.4*  ALBUMIN 2.2* 2.0*  AST 14 13  ALT 8 7  ALKPHOS 76 70  BILITOT 0.1* 0.1*  BILIDIR -- --  IBILI -- --  PREALBUMIN -- --  TRIG -- --  CHOLHDL -- --  CHOL -- --   Estimated Creatinine Clearance: 36.5 ml/min (by C-G formula based on Cr of 0.55).    Insulin Requirements in the past 24 hours:  CBGs < 150, No insulin ordered  Nutritional Goals:  RD recs (10/27): 1300-1500 kcal and 60-80 g protein per day Clinimix E 5/15  at a goal rate of 53ml/hr + IVFE 20% at 38ml/hr on MWF to provide: 84 g/day protein, 1398 Kcal/day avg. (1193 Kcal/day MWF, 1672 Kcal/day STTHS).  Current Nutrition:  NPO starting 11/4  Clinimix-E 5/15 at 70 mL/hr Fat Emulsion 20% at 10 mL/hr MWF  mIVF: NS @ 100 ml/hr (advanced 11/6pm by MD)  Assessment: 78 YOF admitted 10/21 w/ abdominal pain, vomiting and not tolerating POs. Found to have SBO and tx non-sugically w/ NG decompression, failing to progress . Has been NPO since admission. TNA started 10/26.  Consented to surgery 11/2 and was taken to OR 11/2 at ~2200 for exploratory laparotomy with singleband enteral lysis.  NG output decreasing.   Continue TNA per Surgery. Pt is currently meeting 100% of nutritional goals via TNA.  Awaiting resolution of post-op ileus.    Electrolytes: No new labs today. Na low yesterday (unable to adjust Na in TNA). MD increased IVF to 100 ml/hr 11/6pm - will defer further fluid rate changes to MD. Other lytes wnl 11/7  CBG: controlled (< 150 mg/dL) on no insulin  Renal and hepatic function: wnl/stable   TGs and cholesterol: wnl 11/4  Prealbumin: 11.5 (10/27), 12.8 (10/28), 11.9 (11/4)   Plan:   Continue Clinimix-E 5/15 at goal rate of 70 mL/hr  Fat Emulsion 20% at 10 mL/hr on MWF only (due to [employer])  Multivitamins and trace elements on MWF only (due to [employer])  Further IVF rate adjustment per MD (currently receiving 161ml/hr NS + 49ml/hr TNA + 10ml/hr IV lipids = 118ml/hr fluid total)                            Full TNA labs on Mondays and Thursdays   Gwen Her PharmD  [phone] 04/29/2012 8:20 AM

## 2012-04-29 NOTE — Progress Notes (Signed)
Paged Hinda Glatter, NP that family in room and want to speak with him, he states he is in ER and it will be a bit, family informed.

## 2012-04-29 NOTE — Progress Notes (Signed)
Patient ID: Amber Rowland, female   DOB: 06/29/1933, 78 y.o.   MRN: [id number] 6 Days Post-Op  Subjective: Remains very distended; Nurse reports " large clot" on abdominal wound dressing this am; no active ozzing seen at this time.  She feels miserable, distended and depressed.  Wants to know if we have to go back in or if she is going to die?  Objective: Vital signs in last 24 hours: Temp:  [97.8 F (36.6 C)-99 F (37.2 C)] 97.8 F (36.6 C) (11/08 0535) Pulse Rate:  [68-75] 68  (11/08 0535) Resp:  [18-20] 18  (11/08 0535) BP: (104-186)/(47-88) 104/47 mmHg (11/08 0535) SpO2:  [92 %-96 %] 92 % (11/08 0535) Weight:  [87 lb 6.4 oz (39.644 kg)] 87 lb 6.4 oz (39.644 kg) (11/08 0500) Last BM Date: 04/21/12   Intake/Output from previous day: 11/07 0701 - 11/08 0700 In: 3599 [I.V.:2020; TPN:1579] Out: 2750 [Urine:2300; Emesis/NG output:450] Intake/Output this shift:   General appearance: A/A/O, depressed affect Chest: CTA Cardiac: RRR HR 68 Abdomen: remains very distended, there was a large old blood clot on dressing today with some old blood in the midline incision; no active oozing seen, no focal tenderness. Still with minimal BS, nop flatus. NG appears to be functioning well; (output dark bilious fluid) She is 6L + fluid balance today. U/O 2379ml/past 24 hrs. Labs: Checking INR, wbc wnl H&H down slightly currently 9.4/28.5 vs 10.3/31.2 on 11/5 Hyponatremia has resolved VSS, afebrile   Lab Results:   [facility] 04/29/12 0415  WBC 6.5  HGB 9.4*  HCT 28.5*  PLT 282    BMET  Basename 04/28/12 0505 04/27/12 1200  NA 134* 133*  K 3.9 4.1  CL 97 96  CO2 31 32  GLUCOSE 107* 107*  BUN 18 19  CREATININE 0.55 0.52  CALCIUM 9.2 9.0   PT/INR No results found for this basename: LABPROT:2,INR:2 in the last 72 hours   Lab 04/28/12 0505 04/27/12 1200 04/25/12 0423  AST 14 13 12   ALT 8 7 9   ALKPHOS 76 70 73  BILITOT 0.1* 0.1* 0.2*  PROT 5.9* 5.4* 5.6*  ALBUMIN 2.2*  2.0* 2.1*     Lipase     Component Value Date/Time   LIPASE 19 11/05/2007 1455     Studies/Results: Dg Abd 1 View  04/28/2012  *RADIOLOGY REPORT*  Clinical Data: NG tube placement  ABDOMEN - 1 VIEW  Comparison: 04/28/2012  Findings:  Nasogastric tube tip is in the body of the stomach.  The side port is below the GE junction.  Air-filled loops of small and large bowel are again noted compatible with ileus.  The appearance is not significantly improved from previous exam.  Skin staples overlie the right lower quadrant of the abdomen.  IMPRESSION:  1.  NG tube is in satisfactory position within the stomach with side port below GE junction. 2.  No change in ileus pattern   Original Report Authenticated By: Signa Kell, M.D.    Dg Abd Portable 1v  04/28/2012  *RADIOLOGY REPORT*  Clinical Data: NG tube placement  PORTABLE ABDOMEN - 1 VIEW  Comparison: 04/26/2012  Findings: Distended loops of bowel project over the abdomen.  This is more prominent than on the prior study.  The NG tube is not visualized and is therefore above the proximal stomach. Postoperative changes are stable.  IMPRESSION: NG tube is not seen on this study and is therefore above the proximal stomach. Worsening ileus pattern.   Original Report Authenticated By:  Jolaine Click, M.D.     Medications:    . heparin  5,000 Units Subcutaneous Q8H  . lip balm  1 application Topical BID  . pantoprazole (PROTONIX) IV  40 mg Intravenous QHS    Assessment/Plan  Patient Active Problem List  Diagnosis  . UTI (lower urinary tract infection)  . Chronic constipation  . COPD (chronic obstructive pulmonary disease)  . Tobacco abuse  . SBO (small bowel obstruction)  . HTN (hypertension)  . Benign breast cysts in female  . Anxiety  . Lung nodules, ?incidental  . Hyponatremia  . Malnutrition  . Ileus following gastrointestinal surgery  Small bowel obstruction S/P exploratory lap and lysis of adhesions, 04/23/12 Dr. Daphine Deutscher.    Hypokalemia: likely due to NG output  Hyponatremia resolved Malnutrition/TNA prealbumin 11.9  Plan:  Will obtain US of abdomen to r/o hematoma Check INR Continue TNA Mobilize if stable Continue NPO/NG Follow clinical picture Management per medicine team   Wound care       LOS: 18 days    Golda Acre [facility] Pager # [phone]  04/29/2012

## 2012-04-29 NOTE — Progress Notes (Signed)
General Surgery [facility]  Patient seen & examined.  Abdomen is softer to exam, BS present, and patient states she's passing flatus.  Early resolution of ileus?  Will leave NG overnight.  If continued improvement, remove NG and begin clear liquids tomorrow AM.  Needs to ambulate in halls.  Velora Heckler, MD, [facility] Office: [phone]

## 2012-04-29 NOTE — Progress Notes (Signed)
Spoke to Hinda Glatter, NP states ok to continue giving heparin.

## 2012-04-29 NOTE — Progress Notes (Signed)
Spoke to Hinda Glatter, NP on floor pertaining to patient incision with copious amt of bloody drainage and a clot formed around incision site with staples and retention sutures. Riki Rusk states he will be in to see patient shortly, informed only out of NGT and patient remains distended.

## 2012-04-29 NOTE — Progress Notes (Signed)
[facility] PROGRESS NOTE  Amber Rowland ZOX:[id number] DOB: 1934-02-01 DOA: 04/11/2012 PCP: No primary provider on file.  Assessment/Plan:  Hyponatremia, likely hypovolemic hyponatremia due to high insensible losses and continuous loss of electrolytes and fluid via NG tube. Patient has less skin tenting and skin and lips appear moist.  No evidence of edema or pulmonary edema on exam.   -  Was positive yesterday -  Continue NS to 142ml/h  -  Will reassess fluid status again tomorrow -  May not need to recheck BMP tomorrow if advancing diet -  Continue strict I/O and daily weights (only one recorded so far)  SBO with post-operative ileus, patient continues to have bilious drainage from her NG tube and distended abdomen.  Abdomen is softer and she is having flatus and bowel sounds.    - Agree with excellent management by surgery team who may advance her to clear liquid diet tomorrow - Continue TPN until tolerating diet  Hypertension, blood pressure elevated earlier today while NG tube was being manipulated.  -  Add hydralazine as needed for severe HTN -  Trend blood pressure  As before: COPD, stable. If wheezing, consider albuterol as needed.  Pulmonary nodule, 4mm: Will need follow up in 1 year - primary care doctor may arrange.  Atrophy of left kidney, creatinine stable. Minimize nephrotoxic agents   DIET: NPO with TPN at 80ml/h  ACCESS: NG and PICC  IVF:  NS at 147ml/h  PROPH: SCDs   I will followup again tomorrow. Please contact me if I can be of assistance in the meanwhile. Thank you for this consultation.   HPI/Subjective:  Patient states that she had some bleeding from her incision this morning with a blood clot.  She had an ultrasound which demonstrated no hematoma.  She continues to have nausea, but her abdomen is less distended and painful and she is starting to pass gas.      Objective: Filed Vitals:   04/28/12 2200 04/29/12 0500 04/29/12 0535  04/29/12 1355  BP: 126/70  104/47 105/80  Pulse: 69  68 69  Temp: 98.7 F (37.1 C)  97.8 F (36.6 C) 97.9 F (36.6 C)  TempSrc: Oral  Oral Oral  Resp: 18  18 18   Height:      Weight:  39.644 kg (87 lb 6.4 oz)    SpO2: 92%  92% 94%    Intake/Output Summary (Last 24 hours) at 04/29/12 1405 Last data filed at 04/29/12 1355  Gross per 24 hour  Intake   1309 ml  Output   2550 ml  Net  -1241 ml   Filed Weights   04/12/12 1100 04/29/12 0500  Weight: 38.556 kg (85 lb) 39.644 kg (87 lb 6.4 oz)    Exam:  General: Cachectic caucasian female, no acute distress, lying in bed with NG to wall suction with bilious/bloody material draining.  ENT: NG in place, lips appear moist. Moist mucous membranes Cardiovascular: RRR, 3/6 murmur at the LSB, pectus excavatum, 2+ peripheral pulses  Respiratory: CTAB, no increased work of breathing, no rales Abdomen:  Possible BS but did not hear any when wall suction not running, distended, dressing in place over midline c/d/i, mildly tender diffusely without rebound or guarding  Skin:  Moist with less skin tenting  Musculoskeletal: Decreased tone and bulk, no LEE     Data Reviewed: Basic Metabolic Panel:  Lab 04/28/12 8119 04/27/12 1200 04/26/12 0500 04/25/12 0423 04/24/12 0900  NA 134* 133* 127* 129* 130*  K 3.9 4.1 4.7 5.0 5.3*  CL 97 96 95* 99 100  CO2 31 32 28 27 25   GLUCOSE 107* 107* 106* 99 108*  BUN 18 19 22  29* 33*  CREATININE 0.55 0.52 0.63 0.74 0.74  CALCIUM 9.2 9.0 9.4 9.4 9.2  MG 1.9 -- -- 1.9 --  PHOS 3.4 -- -- 3.2 --   Liver Function Tests:  Lab 04/28/12 0505 04/27/12 1200 04/25/12 0423  AST 14 13 12   ALT 8 7 9   ALKPHOS 76 70 73  BILITOT 0.1* 0.1* 0.2*  PROT 5.9* 5.4* 5.6*  ALBUMIN 2.2* 2.0* 2.1*   No results found for this basename: LIPASE:5,AMYLASE:5 in the last 168 hours No results found for this basename: AMMONIA:5 in the last 168 hours CBC:  Lab 04/29/12 0415 04/26/12 0500 04/25/12 0423 04/23/12 0415  WBC 6.5  7.6 8.8 10.9*  NEUTROABS -- -- 6.7 8.9*  HGB 9.4* 10.3* 9.8* 11.1*  HCT 28.5* 31.2* 29.4* 33.0*  MCV 92.5 92.9 93.3 92.4  PLT 282 276 229 233   Cardiac Enzymes: No results found for this basename: CKTOTAL:5,CKMB:5,CKMBINDEX:5,TROPONINI:5 in the last 168 hours BNP (last 3 results) No results found for this basename: PROBNP:3 in the last 8760 hours CBG:  Lab 04/29/12 1148 04/29/12 0420 04/28/12 1946 04/28/12 1133 04/28/12 0704  GLUCAP 121* 99 127* 111* 118*    No results found for this or any previous visit (from the past 240 hour(s)).   Studies: Dg Abd 1 View  04/28/2012  *RADIOLOGY REPORT*  Clinical Data: NG tube placement  ABDOMEN - 1 VIEW  Comparison: 04/28/2012  Findings:  Nasogastric tube tip is in the body of the stomach.  The side port is below the GE junction.  Air-filled loops of small and large bowel are again noted compatible with ileus.  The appearance is not significantly improved from previous exam.  Skin staples overlie the right lower quadrant of the abdomen.  IMPRESSION:  1.  NG tube is in satisfactory position within the stomach with side port below GE junction. 2.  No change in ileus pattern   Original Report Authenticated By: Signa Kell, M.D.    Dg Abd Portable 1v  04/28/2012  *RADIOLOGY REPORT*  Clinical Data: NG tube placement  PORTABLE ABDOMEN - 1 VIEW  Comparison: 04/26/2012  Findings: Distended loops of bowel project over the abdomen.  This is more prominent than on the prior study.  The NG tube is not visualized and is therefore above the proximal stomach. Postoperative changes are stable.  IMPRESSION: NG tube is not seen on this study and is therefore above the proximal stomach. Worsening ileus pattern.   Original Report Authenticated By: Jolaine Click, M.D.     Scheduled Meds:    . heparin  5,000 Units Subcutaneous Q8H  . lip balm  1 application Topical BID  . pantoprazole (PROTONIX) IV  40 mg Intravenous QHS   Continuous Infusions:    . sodium  chloride 100 mL/hr (04/29/12 0704)  . [EXPIRED] TPN (CLINIMIX) +/- additives 70 mL/hr at 04/27/12 1722   And  . [EXPIRED] fat emulsion 240 mL (04/27/12 1722)  . fat emulsion    . TPN (CLINIMIX) +/- additives 70 mL/hr at 04/28/12 1719  . TPN (CLINIMIX) +/- additives      Principal Problem:  *SBO (small bowel obstruction) Active Problems:  UTI (lower urinary tract infection)  Chronic constipation  COPD (chronic obstructive pulmonary disease)  Tobacco abuse  HTN (hypertension)  Benign breast cysts in female  Anxiety  Lung nodules, ?incidental  Hyponatremia  Malnutrition  Ileus following gastrointestinal surgery    Time spent: 30    Jaielle Dlouhy, [facility]  [facility] Pager [phone]. If 8PM-8AM, please contact night-coverage at [email or web address], password [facility] 04/29/2012, 2:05 PM  LOS: 18 days

## 2012-04-30 LAB — GLUCOSE, CAPILLARY: Glucose-Capillary: 103 mg/dL — ABNORMAL HIGH (ref 70–99)

## 2012-04-30 MED ORDER — CLINIMIX E/DEXTROSE (5/15) 5 % IV SOLN
INTRAVENOUS | Status: AC
  Administered 2012-04-30: 18:00:00 via INTRAVENOUS
  Filled 2012-04-30: qty 2000

## 2012-04-30 MED ORDER — SODIUM CHLORIDE 0.9 % IV BOLUS (SEPSIS)
1000.0000 mL | Freq: Once | INTRAVENOUS | Status: AC
  Administered 2012-04-30: 1000 mL via INTRAVENOUS

## 2012-04-30 MED ORDER — ONDANSETRON HCL 4 MG/2ML IJ SOLN
4.0000 mg | Freq: Four times a day (QID) | INTRAMUSCULAR | Status: DC
  Administered 2012-05-01 – 2012-05-07 (×24): 4 mg via INTRAVENOUS
  Filled 2012-04-30 (×26): qty 2

## 2012-04-30 MED ORDER — BIOTENE DRY MOUTH MT LIQD: 15.0000 mL | OROMUCOSAL | Status: DC | PRN

## 2012-04-30 NOTE — Progress Notes (Signed)
[facility] PROGRESS NOTE  Amber Rowland UJW:[id number] DOB: 22-Sep-1933 DOA: 04/11/2012 PCP: No primary provider on file.  Assessment/Plan:  Hyponatremia, likely hypovolemic hyponatremia due to high insensible losses and continuous loss of electrolytes and fluid via NG tube.   Patient has increased skin tenting and skin and lips appear moist.  No evidence of edema or pulmonary edema on exam.   -  Patient was negative 1.8L yesterday, weight is down, and has increased skin tenting -  NS bolus x 1L and increase NS to 170ml/h -  Will reassess fluid status again tomorrow -  Continue strict I/O and daily weights  SBO with post-operative ileus, patient continues to have bilious drainage from her NG tube and distended abdomen.  Abdomen is softer and she is having flatus and bowel sounds.    - Agree with excellent management by surgery team  -  Will schedule zofran as patient is not receiving any prn nausea medications - Continue TPN until tolerating diet  Hypertension, blood pressure elevated earlier today while NG tube was being manipulated.  -  Continue hydralazine as needed for severe HTN  As before: COPD, stable. If wheezing, consider albuterol as needed.  Pulmonary nodule, 4mm: Will need follow up in 1 year - primary care doctor may arrange.  Atrophy of left kidney, creatinine stable. Minimize nephrotoxic agents   Biotene mouth rinse. Continue frequent ambulation.  DIET: NPO with TPN at 64ml/h  ACCESS: NG and PICC  IVF:  NS at 125 ml/h  PROPH: SCDs   I will followup again tomorrow. Please contact me if I can be of assistance in the meanwhile. Thank you for this consultation.   HPI/Subjective:  Patient states her bleeding is stopped.  She was not able to tolerate clamped NG tube due to severe nausea.  Abdomen is still stable in size and level of pain.        Objective: Filed Vitals:   04/29/12 2200 04/30/12 0500 04/30/12 0551 04/30/12 1346  BP: 140/72  151/54 162/82    Pulse: 64  69 59  Temp: 98.2 F (36.8 C)  98.7 F (37.1 C) 98.6 F (37 C)  TempSrc: Oral  Oral Oral  Resp: 20  18 18   Height:      Weight:  39.236 kg (86 lb 8 oz)    SpO2: 94%  94% 95%    Intake/Output Summary (Last 24 hours) at 04/30/12 1855 Last data filed at 04/30/12 1826  Gross per 24 hour  Intake 2643.33 ml  Output   3800 ml  Net -1156.67 ml   Filed Weights   04/12/12 1100 04/29/12 0500 04/30/12 0500  Weight: 38.556 kg (85 lb) 39.644 kg (87 lb 6.4 oz) 39.236 kg (86 lb 8 oz)    Exam:  General: Cachectic caucasian female, no acute distress, lying in bed with NG to wall suction  ENT: NG in place, lips appear dry, tongue is dry Cardiovascular: RRR, 3/6 murmur at the LSB, pectus excavatum, 2+ peripheral pulses  Respiratory: CTAB, no increased work of breathing, no rales Abdomen:  No BS, distended, dressing in place over midline c/d/i and incision appears to be healing well, mildly tender diffusely without rebound or guarding  Skin:  Dry with skin tenting  Musculoskeletal: Decreased tone and bulk, no LEE     Data Reviewed: Basic Metabolic Panel:  Lab 04/28/12 5621 04/27/12 1200 04/26/12 0500 04/25/12 0423 04/24/12 0900  NA 134* 133* 127* 129* 130*  K 3.9 4.1 4.7 5.0 5.3*  CL  97 96 95* 99 100  CO2 31 32 28 27 25   GLUCOSE 107* 107* 106* 99 108*  BUN 18 19 22  29* 33*  CREATININE 0.55 0.52 0.63 0.74 0.74  CALCIUM 9.2 9.0 9.4 9.4 9.2  MG 1.9 -- -- 1.9 --  PHOS 3.4 -- -- 3.2 --   Liver Function Tests:  Lab 04/28/12 0505 04/27/12 1200 04/25/12 0423  AST 14 13 12   ALT 8 7 9   ALKPHOS 76 70 73  BILITOT 0.1* 0.1* 0.2*  PROT 5.9* 5.4* 5.6*  ALBUMIN 2.2* 2.0* 2.1*   No results found for this basename: LIPASE:5,AMYLASE:5 in the last 168 hours No results found for this basename: AMMONIA:5 in the last 168 hours CBC:  Lab 04/29/12 0415 04/26/12 0500 04/25/12 0423  WBC 6.5 7.6 8.8  NEUTROABS -- -- 6.7  HGB 9.4* 10.3* 9.8*  HCT 28.5* 31.2* 29.4*  MCV 92.5 92.9 93.3   PLT 282 276 229   Cardiac Enzymes: No results found for this basename: CKTOTAL:5,CKMB:5,CKMBINDEX:5,TROPONINI:5 in the last 168 hours BNP (last 3 results) No results found for this basename: PROBNP:3 in the last 8760 hours CBG:  Lab 04/30/12 1207 04/30/12 0349 04/29/12 2002 04/29/12 1148 04/29/12 0420  GLUCAP 113* 103* 109* 121* 99    No results found for this or any previous visit (from the past 240 hour(s)).   Studies: US Abdomen Limited  04/29/2012  *RADIOLOGY REPORT*  Clinical Data: Bleeding at the abdominal incision.  Evaluate for hematoma.  LIMITED ABDOMINAL ULTRASOUND  Comparison:  CT of the abdomen and pelvis 04/12/2012  Findings: Images are performed of the midline, in the area of concern.  There is no evidence for abdominal wall hematoma.  Intra- abdominal hematoma would not be visible on this exam.  IMPRESSION: No evidence for abdominal wall hematoma in the area of concern.   Original Report Authenticated By: Norva Pavlov, M.D.     Scheduled Meds:    . heparin  5,000 Units Subcutaneous Q8H  . lip balm  1 application Topical BID  . pantoprazole (PROTONIX) IV  40 mg Intravenous QHS   Continuous Infusions:    . sodium chloride 100 mL/hr (04/30/12 0328)  . [EXPIRED] fat emulsion 240 mL (04/29/12 1755)  . [EXPIRED] TPN (CLINIMIX) +/- additives 70 mL/hr at 04/29/12 1755  . TPN (CLINIMIX) +/- additives 70 mL/hr at 04/30/12 1736    Principal Problem:  *SBO (small bowel obstruction) Active Problems:  UTI (lower urinary tract infection)  Chronic constipation  COPD (chronic obstructive pulmonary disease)  Tobacco abuse  HTN (hypertension)  Benign breast cysts in female  Anxiety  Lung nodules, ?incidental  Hyponatremia  Malnutrition  Ileus following gastrointestinal surgery    Time spent: 30    Tiajuana Leppanen, [facility]  [facility] Pager [phone]. If 8PM-8AM, please contact night-coverage at [email or web address], password [facility] 04/30/2012, 6:54 PM  LOS: 19 days

## 2012-04-30 NOTE — Progress Notes (Signed)
Patient ID: Amber Rowland, female   DOB: [birth year], 78 y.o.   MRN: [id number]  General Surgery - [facility] - Progress Note  POD# 7  Subjective: Patient wants to take liquids, but does not want NG tube removed yet!  Passing flatus.  Small hard BM per patient.  Objective: Vital signs in last 24 hours: Temp:  [97.9 F (36.6 C)-98.7 F (37.1 C)] 98.7 F (37.1 C) (11/09 0551) Pulse Rate:  [64-69] 69  (11/09 0551) Resp:  [18-20] 18  (11/09 0551) BP: (105-151)/(54-80) 151/54 mmHg (11/09 0551) SpO2:  [94 %] 94 % (11/09 0551) Weight:  [86 lb 8 oz (39.236 kg)] 86 lb 8 oz (39.236 kg) (11/09 0500) Last BM Date: 04/21/12  Intake/Output from previous day: 11/08 0701 - 11/09 0700 In: 2040 [I.V.:1200; TPN:840] Out: 3400 [Urine:2700; Emesis/NG output:700]  Exam: HEENT - clear, not icteric Neck - soft Chest - clear bilaterally Cor - RRR, no murmur Abd - softer, protuberant, non-tender; BS present Ext - no significant edema Neuro - grossly intact, no focal deficits  Lab Results:   [facility] 04/29/12 0415  WBC 6.5  HGB 9.4*  HCT 28.5*  PLT 282     Basename 04/28/12 0505 04/27/12 1200  NA 134* 133*  K 3.9 4.1  CL 97 96  CO2 31 32  GLUCOSE 107* 107*  BUN 18 19  CREATININE 0.55 0.52  CALCIUM 9.2 9.0    Studies/Results: Dg Abd 1 View  04/28/2012  *RADIOLOGY REPORT*  Clinical Data: NG tube placement  ABDOMEN - 1 VIEW  Comparison: 04/28/2012  Findings:  Nasogastric tube tip is in the body of the stomach.  The side port is below the GE junction.  Air-filled loops of small and large bowel are again noted compatible with ileus.  The appearance is not significantly improved from previous exam.  Skin staples overlie the right lower quadrant of the abdomen.  IMPRESSION:  1.  NG tube is in satisfactory position within the stomach with side port below GE junction. 2.  No change in ileus pattern   Original Report Authenticated By: Signa Kell, M.D.    US Abdomen  Limited  04/29/2012  *RADIOLOGY REPORT*  Clinical Data: Bleeding at the abdominal incision.  Evaluate for hematoma.  LIMITED ABDOMINAL ULTRASOUND  Comparison:  CT of the abdomen and pelvis 04/12/2012  Findings: Images are performed of the midline, in the area of concern.  There is no evidence for abdominal wall hematoma.  Intra- abdominal hematoma would not be visible on this exam.  IMPRESSION: No evidence for abdominal wall hematoma in the area of concern.   Original Report Authenticated By: Norva Pavlov, M.D.    Dg Abd Portable 1v  04/28/2012  *RADIOLOGY REPORT*  Clinical Data: NG tube placement  PORTABLE ABDOMEN - 1 VIEW  Comparison: 04/26/2012  Findings: Distended loops of bowel project over the abdomen.  This is more prominent than on the prior study.  The NG tube is not visualized and is therefore above the proximal stomach. Postoperative changes are stable.  IMPRESSION: NG tube is not seen on this study and is therefore above the proximal stomach. Worsening ileus pattern.   Original Report Authenticated By: Jolaine Click, M.D.     Assessment / Plan: Small bowel obstruction S/P exploratory lap and lysis of adhesions, 04/23/12 Dr. Daphine Deutscher  Will take NG off of suction now  Allow sips of clear liquids  Encourage OOB, ambulation Hypokalemia: likely due to NG output  Hyponatremia resolved  Malnutrition/TNA  prealbumin 11.9   Velora Heckler, MD, [facility] Office: [phone]  04/30/2012

## 2012-04-30 NOTE — Progress Notes (Signed)
PARENTERAL NUTRITION CONSULT NOTE   Pharmacy Consult for TNA Indication: SBO  No Known Allergies  Patient Measurements: Height: 4\' 9"  (144.8 cm) Weight: 86 lb 8 oz (39.236 kg) IBW/kg (Calculated) : 38.6   Vital Signs: Temp: 98.7 F (37.1 C) (11/09 0551) Temp src: Oral (11/09 0551) BP: 151/54 mmHg (11/09 0551) Pulse Rate: 69  (11/09 0551) Intake/Output from previous day: 11/08 0701 - 11/09 0700 In: 2040 [I.V.:1200; TPN:840] Out: 3400 [Urine:2700; Emesis/NG output:700]  Labs:  [facility] 04/29/12 1100 04/29/12 0415  WBC -- 6.5  HGB -- 9.4*  HCT -- 28.5*  PLT -- 282  APTT -- --  INR 1.05 --     Basename 04/28/12 0505 04/27/12 1200  NA 134* 133*  K 3.9 4.1  CL 97 96  CO2 31 32  GLUCOSE 107* 107*  BUN 18 19  CREATININE 0.55 0.52  LABCREA -- --  CREAT24HRUR -- --  CALCIUM 9.2 9.0  MG 1.9 --  PHOS 3.4 --  PROT 5.9* 5.4*  ALBUMIN 2.2* 2.0*  AST 14 13  ALT 8 7  ALKPHOS 76 70  BILITOT 0.1* 0.1*  BILIDIR -- --  IBILI -- --  PREALBUMIN -- --  TRIG -- --  CHOLHDL -- --  CHOL -- --   Estimated Creatinine Clearance: 36.5 ml/min (by C-G formula based on Cr of 0.55).    Insulin Requirements in the past 24 hours:  CBGs < 150, No insulin ordered  Nutritional Goals:  RD recs (10/27): 1300-1500 kcal and 60-80 g protein per day Clinimix E 5/15  at a goal rate of 72ml/hr + IVFE 20% at 19ml/hr on MWF to provide: 84 g/day protein, 1398 Kcal/day avg. (1193 Kcal/day MWF, 1672 Kcal/day STTHS).  Current Nutrition:  NPO starting 11/4  Clinimix-E 5/15 at 70 mL/hr Fat Emulsion 20% at 10 mL/hr MWF  mIVF: NS @ 100 ml/hr (advanced 11/6pm by MD)  Assessment: 78 YOF admitted 10/21 w/ abdominal pain, vomiting and not tolerating POs. Found to have SBO and tx non-sugically w/ NG decompression, failing to progress . Has been NPO since admission. TNA started 10/26.  Consented to surgery 11/2 and was taken to OR 11/2 at ~2200 for exploratory laparotomy with singleband enteral  lysis.  NG output decreasing.  Continue TNA per Surgery. Pt is currently meeting 100% of nutritional goals via TNA.  Awaiting resolution of post-op ileus.    Electrolytes: No new labs today. Na low (unable to adjust Na in TNA). MD increased IVF to 100 ml/hr 11/6pm - will defer further fluid rate changes to MD. Other lytes wnl 11/7  CBG: controlled (< 150 mg/dL) on no insulin  Renal and hepatic function: wnl/stable   TGs and cholesterol: wnl 11/4  Prealbumin: 11.5 (10/27), 12.8 (10/28), 11.9 (11/4)   Plan:   Continue Clinimix-E 5/15 at goal rate of 70 mL/hr  Fat Emulsion 20% at 10 mL/hr on MWF only (due to national shortage)  Multivitamins and trace elements on MWF only (due to [employer])  Further IVF rate adjustment per MD (currently receiving 131ml/hr NS + 35ml/hr TNA + 41ml/hr IV lipids = 170ml/hr fluid total)                            Full TNA labs on Mondays and Thursdays   Possible diet advancement today per MD noted  Darrol Angel, PharmD Pager: [phone] 04/30/2012 6:44 AM

## 2012-04-30 NOTE — Progress Notes (Signed)
Several hours after I had clamped pt's NGT she started c/o increased pain and nausea.  Pt asked me to unclamp NG and hook her back up to suction.  I notified Md on call, and obtained order to restart her NGT to [facility].  After about 30 min pt NG had drained about 50cc of brownish black liquid.  Will continue to monitor.

## 2012-05-01 LAB — GLUCOSE, CAPILLARY
Glucose-Capillary: 109 mg/dL — ABNORMAL HIGH (ref 70–99)
Glucose-Capillary: 115 mg/dL — ABNORMAL HIGH (ref 70–99)

## 2012-05-01 MED ORDER — SODIUM CHLORIDE 0.9 % IV SOLN
INTRAVENOUS | Status: DC
  Administered 2012-05-02 – 2012-05-05 (×7): via INTRAVENOUS

## 2012-05-01 MED ORDER — CLINIMIX E/DEXTROSE (5/15) 5 % IV SOLN
INTRAVENOUS | Status: AC
  Administered 2012-05-01: 17:00:00 via INTRAVENOUS
  Filled 2012-05-01: qty 2000

## 2012-05-01 NOTE — Progress Notes (Signed)
Patient ID: Amber Rowland, female   DOB: [birth year], 78 y.o.   MRN: [id number]  General Surgery - [facility] - Progress Note  POD# 8  Subjective: Patient did not tolerate clamping of NG yesterday - developed nausea and requested NG be place back to suction.  Taking clear liquids still.  Passing flatus, no BM.  Objective: Vital signs in last 24 hours: Temp:  [97.4 F (36.3 C)-98.7 F (37.1 C)] 97.4 F (36.3 C) (11/10 0500) Pulse Rate:  [59-69] 69  (11/10 0500) Resp:  [18-20] 20  (11/10 0500) BP: (123-162)/(73-82) 123/73 mmHg (11/10 0500) SpO2:  [92 %-97 %] 92 % (11/10 0500) Weight:  [82 lb 14.3 oz (37.6 kg)] 82 lb 14.3 oz (37.6 kg) (11/10 0620) Last BM Date: 04/21/12  Intake/Output from previous day: 11/09 0701 - 11/10 0700 In: 4593.3 [I.V.:3393.3; NG/GT:200; IV Piggyback:1000] Out: 2950 [Urine:2550; Emesis/NG output:400]  Exam: HEENT - clear, not icteric Neck - soft Chest - clear bilaterally Cor - RRR, no murmur Abd - soft, mild distension; BS present; wound clear and dry, small hematoma at skin edge Ext - no significant edema Neuro - grossly intact, no focal deficits  Lab Results:   Basename 04/29/12 0415  WBC 6.5  HGB 9.4*  HCT 28.5*  PLT 282    No results found for this basename: NA:2,K:2,CL:2,CO2:2,GLUCOSE:2,BUN:2,CREATININE:2,CALCIUM:2 in the last 72 hours  Studies/Results: US Abdomen Limited  04/29/2012  *RADIOLOGY REPORT*  Clinical Data: Bleeding at the abdominal incision.  Evaluate for hematoma.  LIMITED ABDOMINAL ULTRASOUND  Comparison:  CT of the abdomen and pelvis 04/12/2012  Findings: Images are performed of the midline, in the area of concern.  There is no evidence for abdominal wall hematoma.  Intra- abdominal hematoma would not be visible on this exam.  IMPRESSION: No evidence for abdominal wall hematoma in the area of concern.   Original Report Authenticated By: Norva Pavlov, M.D.     Assessment / Plan: 1.  Status post ex  lap with LOA  Resolving ileus  Patient wants to keep NG to suction one more day  Allow clear liquids - limited  Encouraged ambulation  Velora Heckler, MD, [facility] Office: [phone]  05/01/2012

## 2012-05-01 NOTE — Progress Notes (Signed)
PARENTERAL NUTRITION CONSULT NOTE   Pharmacy Consult for TNA Indication: SBO  No Known Allergies  Patient Measurements: Height: 4\' 9"  (144.8 cm) Weight: 82 lb 14.3 oz (37.6 kg) IBW/kg (Calculated) : 38.6   Vital Signs: Temp: 97.4 F (36.3 C) (11/10 0500) Temp src: Oral (11/10 0500) BP: 123/73 mmHg (11/10 0500) Pulse Rate: 69  (11/10 0500) Intake/Output from previous day: 11/09 0701 - 11/10 0700 In: 4593.3 [I.V.:3393.3; NG/GT:200; IV Piggyback:1000] Out: 2950 [Urine:2550; Emesis/NG output:400]  Labs:  [facility] 04/29/12 1100 04/29/12 0415  WBC -- 6.5  HGB -- 9.4*  HCT -- 28.5*  PLT -- 282  APTT -- --  INR 1.05 --    No results found for this basename: NA:3,K:3,CL:3,CO2:3,GLUCOSE:3,BUN:3,CREATININE:3,LABCREA:3,CREAT24HRUR:3,CALCIUM:3,MG:3,PHOS:3,PROT:3,ALBUMIN:3,AST:3,ALT:3,ALKPHOS:3,BILITOT:3,BILIDIR:3,IBILI:3,PREALBUMIN:3,TRIG:3,CHOLHDL:3,CHOL:3 in the last 72 hours Estimated Creatinine Clearance: 35.5 ml/min (by C-G formula based on Cr of 0.55).    Insulin Requirements in the past 24 hours:  CBGs < 150, No insulin ordered  Nutritional Goals:  RD recs (10/27): 1300-1500 kcal and 60-80 g protein per day Clinimix E 5/15  at a goal rate of 1ml/hr + IVFE 20% at 15ml/hr on MWF to provide: 84 g/day protein, 1398 Kcal/day avg. (1193 Kcal/day MWF, 1672 Kcal/day STTHS).  Current Nutrition:  Advanced to CLD on 11/9 Clinimix-E 5/15 at 70 mL/hr Fat Emulsion 20% at 10 mL/hr MWF only mIVF: NS @ 125 ml/hr (increased by MD on 11/9)  Assessment: 78 YOF admitted 10/21 w/ abdominal pain, vomiting and not tolerating POs. Found to have SBO and tx non-sugically w/ NG decompression, failing to progress . Has been NPO since admission. TNA started 10/26.  Consented to surgery 11/2 and was taken to OR 11/2 at ~2200 for exploratory laparotomy with singleband enteral lysis.  NG output decreasing, however, reportedly failed trial of clamping NG (reportedly c/o increased pain and nausea, NGT  to LWS resumed, then had 50ml brownish black liquid drained).Continue TNA per Surgery. Pt is currently meeting 100% of nutritional goals via TNA.  Awaiting resolution of post-op ileus. Diet advanced to CLD on 11/9 - zero PO intake recorded for 11/9.   Electrolytes: No new labs today. Previously, Na low (unable to adjust Na in TNA). MD increased IVF to 125 ml/hr on 11/9 - will defer further fluid rate changes to MD. Other lytes wnl on 11/7  CBG: controlled (< 150 mg/dL) on no insulin  Renal and hepatic function: wnl/stable   TGs and cholesterol: wnl 11/4  Prealbumin: 11.5 (10/27), 12.8 (10/28), 11.9 (11/4)   Plan:   Continue Clinimix-E 5/15 at goal rate of 70 mL/hr  Fat Emulsion 20% at 10 mL/hr on MWF only (due to Sport and exercise psychologist)  Multivitamins and trace elements on MWF only (due to Sport and exercise psychologist)  Further IVF rate adjustment per MD (currently receiving 138ml/hr NS + 75ml/hr TNA + 24ml/hr IV lipids = 195-222ml/hr fluid = 4680 - 4920 ml/day)                            Full TNA labs on Mondays and Thursdays   Follow up tolerance of PO diet and NG tube clamping/removal  Darrol Angel, PharmD Pager: [phone] 05/01/2012 6:43 AM

## 2012-05-01 NOTE — Progress Notes (Signed)
[facility] PROGRESS NOTE  Amber Rowland OVF:[id number] DOB: December 14, 1933 DOA: 04/11/2012 PCP: No primary provider on file.  Assessment/Plan:  Hyponatremia, likely hypovolemic hyponatremia due to high insensible losses and continuous loss of electrolytes and fluid via NG tube.  Appears euvolemic today and was 1.6L positive yesterday. -  NS at 169ml/h in anticipation of starting clears tomorrow -  Goal is I=O for now -  Will reassess fluid status again tomorrow -  Continue strict I/O and daily weights  SBO with post-operative ileus, patient continues to have bilious drainage from her NG tube and distended abdomen.  Abdomen is softer and she is having flatus and bowel sounds.   She appears to have a lot of anxiety about starting her diet.  - Agree with excellent management by surgery team  -  Continue scheduled zofran - Continue TPN until tolerating diet  Hypertension, blood pressure normal to mildly elevated in the setting of acute illness.  -  Continue hydralazine as needed for severe HTN  As before: COPD, stable. If wheezing, consider albuterol as needed.  Pulmonary nodule, 4mm: Will need follow up in 1 year - primary care doctor may arrange.  Atrophy of left kidney, creatinine stable. Minimize nephrotoxic agents   Biotene mouth rinse. Continue frequent ambulation.  DIET: NPO with TPN at 38ml/h  ACCESS: NG and PICC  IVF:  NS at 100 ml/h  PROPH: SCDs   I will followup again tomorrow. Please contact me if I can be of assistance in the meanwhile. Thank you for this consultation.   HPI/Subjective:  Patient states that when her belly is distended, she feels [name] of breath.  Endorses nausea, but states she was able to eat breakfast this morning.  Her NG tube was still to LIS, however, and her food mostly got sucked out.    Objective: Filed Vitals:   04/30/12 2020 05/01/12 0500 05/01/12 0620 05/01/12 1323  BP: 153/73 123/73  156/67  Pulse: 63 69  61  Temp: 98.7 F  (37.1 C) 97.4 F (36.3 C)  98.2 F (36.8 C)  TempSrc: Oral Oral  Oral  Resp: 20 20  18   Height:      Weight:   37.6 kg (82 lb 14.3 oz)   SpO2: 97% 92%  94%    Intake/Output Summary (Last 24 hours) at 05/01/12 1751 Last data filed at 05/01/12 1400  Gross per 24 hour  Intake 2873.33 ml  Output   2400 ml  Net 473.33 ml   Filed Weights   04/29/12 0500 04/30/12 0500 05/01/12 0620  Weight: 39.644 kg (87 lb 6.4 oz) 39.236 kg (86 lb 8 oz) 37.6 kg (82 lb 14.3 oz)    Exam:  General: Cachectic caucasian female, no acute distress, lying in bed with NG to wall suction  ENT: NG in place, lips appear moist, tongue is moist Cardiovascular: RRR, 3/6 murmur at the LSB, pectus excavatum, 2+ peripheral pulses  Respiratory: CTAB, no increased work of breathing, no rales Abdomen:  No BS, less distended, dressing in place over midline c/d/i, mildly tender diffusely without rebound or guarding  Skin:  No skin tenting on forehead, appears moist  Musculoskeletal: Decreased tone and bulk, no LEE     Data Reviewed: Basic Metabolic Panel:  Lab 04/28/12 8416 04/27/12 1200 04/26/12 0500 04/25/12 0423  NA 134* 133* 127* 129*  K 3.9 4.1 4.7 5.0  CL 97 96 95* 99  CO2 31 32 28 27  GLUCOSE 107* 107* 106* 99  BUN 18  19 22 29*  CREATININE 0.55 0.52 0.63 0.74  CALCIUM 9.2 9.0 9.4 9.4  MG 1.9 -- -- 1.9  PHOS 3.4 -- -- 3.2   Liver Function Tests:  Lab 04/28/12 0505 04/27/12 1200 04/25/12 0423  AST 14 13 12   ALT 8 7 9   ALKPHOS 76 70 73  BILITOT 0.1* 0.1* 0.2*  PROT 5.9* 5.4* 5.6*  ALBUMIN 2.2* 2.0* 2.1*   No results found for this basename: LIPASE:5,AMYLASE:5 in the last 168 hours No results found for this basename: AMMONIA:5 in the last 168 hours CBC:  Lab 04/29/12 0415 04/26/12 0500 04/25/12 0423  WBC 6.5 7.6 8.8  NEUTROABS -- -- 6.7  HGB 9.4* 10.3* 9.8*  HCT 28.5* 31.2* 29.4*  MCV 92.5 92.9 93.3  PLT 282 276 229   Cardiac Enzymes: No results found for this basename:  CKTOTAL:5,CKMB:5,CKMBINDEX:5,TROPONINI:5 in the last 168 hours BNP (last 3 results) No results found for this basename: PROBNP:3 in the last 8760 hours CBG:  Lab 05/01/12 1136 05/01/12 0418 04/30/12 1947 04/30/12 1207 04/30/12 0349  GLUCAP 116* 109* 109* 113* 103*    No results found for this or any previous visit (from the past 240 hour(s)).   Studies: No results found.  Scheduled Meds:    . heparin  5,000 Units Subcutaneous Q8H  . lip balm  1 application Topical BID  . ondansetron (ZOFRAN) IV  4 mg Intravenous Q6H  . pantoprazole (PROTONIX) IV  40 mg Intravenous QHS  . [COMPLETED] sodium chloride  1,000 mL Intravenous Once   Continuous Infusions:    . sodium chloride    . [EXPIRED] fat emulsion 240 mL (04/29/12 1755)  . [EXPIRED] TPN (CLINIMIX) +/- additives 70 mL/hr at 04/29/12 1755  . TPN (CLINIMIX) +/- additives 70 mL/hr at 04/30/12 1736  . TPN (CLINIMIX) +/- additives 70 mL/hr at 05/01/12 1724  . [DISCONTINUED] sodium chloride 125 mL/hr at 05/01/12 4098    Principal Problem:  *SBO (small bowel obstruction) Active Problems:  UTI (lower urinary tract infection)  Chronic constipation  COPD (chronic obstructive pulmonary disease)  Tobacco abuse  HTN (hypertension)  Benign breast cysts in female  Anxiety  Lung nodules, ?incidental  Hyponatremia  Malnutrition  Ileus following gastrointestinal surgery    Time spent: 30    Amber Rowland, Amber Rowland  [facility] Pager [phone]. If 8PM-8AM, please contact night-coverage at [email or web address], password [facility] 05/01/2012, 5:51 PM  LOS: 20 days

## 2012-05-02 DIAGNOSIS — K56609 Unspecified intestinal obstruction, unspecified as to partial versus complete obstruction: Secondary | ICD-10-CM

## 2012-05-02 LAB — DIFFERENTIAL
Basophils Absolute: 0 10*3/uL (ref 0.0–0.1)
Lymphocytes Relative: 20 % (ref 12–46)
Lymphs Abs: 1.3 10*3/uL (ref 0.7–4.0)
Monocytes Absolute: 0.6 10*3/uL (ref 0.1–1.0)
Monocytes Relative: 9 % (ref 3–12)
Neutro Abs: 4.2 10*3/uL (ref 1.7–7.7)

## 2012-05-02 LAB — COMPREHENSIVE METABOLIC PANEL
ALT: 6 U/L (ref 0–35)
AST: 12 U/L (ref 0–37)
Albumin: 2.1 g/dL — ABNORMAL LOW (ref 3.5–5.2)
Alkaline Phosphatase: 68 U/L (ref 39–117)
Chloride: 100 mEq/L (ref 96–112)
Potassium: 4 mEq/L (ref 3.5–5.1)
Sodium: 133 mEq/L — ABNORMAL LOW (ref 135–145)
Total Protein: 5.2 g/dL — ABNORMAL LOW (ref 6.0–8.3)

## 2012-05-02 LAB — CBC
MCV: 93.5 fL (ref 78.0–100.0)
Platelets: 258 10*3/uL (ref 150–400)
RDW: 13.2 % (ref 11.5–15.5)
WBC: 6.4 10*3/uL (ref 4.0–10.5)

## 2012-05-02 LAB — GLUCOSE, CAPILLARY: Glucose-Capillary: 101 mg/dL — ABNORMAL HIGH (ref 70–99)

## 2012-05-02 LAB — PREALBUMIN: Prealbumin: 14.2 mg/dL — ABNORMAL LOW (ref 17.0–34.0)

## 2012-05-02 LAB — MAGNESIUM: Magnesium: 1.8 mg/dL (ref 1.5–2.5)

## 2012-05-02 MED ORDER — BISACODYL 10 MG RE SUPP
10.0000 mg | Freq: Once | RECTAL | Status: AC
  Administered 2012-05-02: 10 mg via RECTAL
  Filled 2012-05-02: qty 1

## 2012-05-02 MED ORDER — FAT EMULSION 20 % IV EMUL
250.0000 mL | INTRAVENOUS | Status: AC
  Administered 2012-05-02: 250 mL via INTRAVENOUS
  Filled 2012-05-02: qty 250

## 2012-05-02 MED ORDER — TRACE MINERALS CR-CU-F-FE-I-MN-MO-SE-ZN IV SOLN
INTRAVENOUS | Status: AC
  Administered 2012-05-02: 18:00:00 via INTRAVENOUS
  Filled 2012-05-02: qty 2000

## 2012-05-02 NOTE — Progress Notes (Signed)
[facility] PROGRESS NOTE  Amber Rowland QMV:[id number] DOB: Dec 07, 1933 DOA: 04/11/2012 PCP: No primary provider on file.  Assessment/Plan:  SBO with post-operative ileus, Patient appears to be improving.  Abdomen is less distended with good bowel sounds. Sodium only mildly depressed and patient appears euvolemic.   -  Agree with excellent management by surgery team  -  Continue scheduled zofran -  Continue TPN and IVF at current rate until tolerating full liquid diet  Hypertension, blood pressure normal to mildly elevated in the setting of acute illness.  -  Continue hydralazine as needed for severe HTN COPD, stable. If wheezing, consider albuterol as needed.  Pulmonary nodule, 4mm: Will need follow up in 1 year - primary care doctor may arrange.  Atrophy of left kidney, creatinine stable. Minimize nephrotoxic agents   Will follow.  Please contact me if I can be of assistance in the meanwhile. Thank you for this consultation.   HPI/Subjective:  NG tube clamped.  Nausea this morning.  No flatus.  Abdomen is itching over incision, but otherwise nontender.     Objective: Filed Vitals:   05/01/12 2100 05/02/12 0430 05/02/12 0500 05/02/12 1400  BP: 147/68 119/66  144/79  Pulse: 70 63  64  Temp: 97.8 F (36.6 C) 97.7 F (36.5 C)  98.5 F (36.9 C)  TempSrc: Oral Oral  Oral  Resp: 18 18  18   Height:      Weight:   40.688 kg (89 lb 11.2 oz)   SpO2: 93% 95%  90%    Intake/Output Summary (Last 24 hours) at 05/02/12 1923 Last data filed at 05/02/12 1800  Gross per 24 hour  Intake   2300 ml  Output   2025 ml  Net    275 ml   Filed Weights   04/30/12 0500 05/01/12 0620 05/02/12 0500  Weight: 39.236 kg (86 lb 8 oz) 37.6 kg (82 lb 14.3 oz) 40.688 kg (89 lb 11.2 oz)    Exam:  General: Cachectic caucasian female, no acute distress ENT: NG in place, lips appear moist, tongue is moist Cardiovascular: RRR, 3/6 murmur at the LSB, pectus excavatum, 2+ peripheral pulses    Respiratory: CTAB, no increased work of breathing, no rales Abdomen:  NABS, less distended, incision c/d/i, nontender Skin:  No skin tenting on forehead, appears moist  Musculoskeletal: Decreased tone and bulk, no LEE     Data Reviewed: Basic Metabolic Panel:  Lab 05/02/12 2841 04/28/12 0505 04/27/12 1200 04/26/12 0500  NA 133* 134* 133* 127*  K 4.0 3.9 4.1 4.7  CL 100 97 96 95*  CO2 31 31 32 28  GLUCOSE 98 107* 107* 106*  BUN 20 18 19 22   CREATININE 0.62 0.55 0.52 0.63  CALCIUM 8.6 9.2 9.0 9.4  MG 1.8 1.9 -- --  PHOS 3.6 3.4 -- --   Liver Function Tests:  Lab 05/02/12 0520 04/28/12 0505 04/27/12 1200  AST 12 14 13   ALT 6 8 7   ALKPHOS 68 76 70  BILITOT 0.2* 0.1* 0.1*  PROT 5.2* 5.9* 5.4*  ALBUMIN 2.1* 2.2* 2.0*   No results found for this basename: LIPASE:5,AMYLASE:5 in the last 168 hours No results found for this basename: AMMONIA:5 in the last 168 hours CBC:  Lab 05/02/12 0520 04/29/12 0415 04/26/12 0500  WBC 6.4 6.5 7.6  NEUTROABS 4.2 -- --  HGB 8.9* 9.4* 10.3*  HCT 27.2* 28.5* 31.2*  MCV 93.5 92.5 92.9  PLT 258 282 276   Cardiac Enzymes: No results found  for this basename: CKTOTAL:5,CKMB:5,CKMBINDEX:5,TROPONINI:5 in the last 168 hours BNP (last 3 results) No results found for this basename: PROBNP:3 in the last 8760 hours CBG:  Lab 05/02/12 1135 05/02/12 0423 05/01/12 2006 05/01/12 1136 05/01/12 0418  GLUCAP 111* 101* 115* 116* 109*    No results found for this or any previous visit (from the past 240 hour(s)).   Studies: No results found.  Scheduled Meds:    . [COMPLETED] bisacodyl  10 mg Rectal Once  . heparin  5,000 Units Subcutaneous Q8H  . lip balm  1 application Topical BID  . ondansetron (ZOFRAN) IV  4 mg Intravenous Q6H  . pantoprazole (PROTONIX) IV  40 mg Intravenous QHS   Continuous Infusions:    . sodium chloride 100 mL/hr at 05/02/12 1202  . TPN (CLINIMIX) +/- additives 70 mL/hr at 05/02/12 1744   And  . fat emulsion 250 mL  (05/02/12 1745)  . [EXPIRED] TPN (CLINIMIX) +/- additives 70 mL/hr at 05/02/12 0600    Principal Problem:  *SBO (small bowel obstruction) Active Problems:  UTI (lower urinary tract infection)  Chronic constipation  COPD (chronic obstructive pulmonary disease)  Tobacco abuse  HTN (hypertension)  Benign breast cysts in female  Anxiety  Lung nodules, ?incidental  Hyponatremia  Malnutrition  Ileus following gastrointestinal surgery    Time spent: 30    Victoire Deans, [facility]  [facility] Pager [phone]. If 8PM-8AM, please contact night-coverage at [email or web address], password [facility] 05/02/2012, 7:23 PM  LOS: 21 days

## 2012-05-02 NOTE — Progress Notes (Signed)
PARENTERAL NUTRITION CONSULT NOTE   Pharmacy Consult for TNA Indication: SBO  No Known Allergies  Patient Measurements: Height: 4\' 9"  (144.8 cm) Weight: 89 lb 11.2 oz (40.688 kg) IBW/kg (Calculated) : 38.6   Vital Signs: Temp: 97.7 F (36.5 C) (11/11 0430) Temp src: Oral (11/11 0430) BP: 119/66 mmHg (11/11 0430) Pulse Rate: 63  (11/11 0430) Intake/Output from previous day: 11/10 0701 - 11/11 0700 In: 6410 [P.O.:480; I.V.:2150; TPN:3780] Out: 3625 [Urine:2375; Emesis/NG output:1250]  Labs:  [facility] 05/02/12 0520 04/29/12 1100  WBC 6.4 --  HGB 8.9* --  HCT 27.2* --  PLT 258 --  APTT -- --  INR -- 1.05     Basename 05/02/12 0520  NA 133*  K 4.0  CL 100  CO2 31  GLUCOSE 98  BUN 20  CREATININE 0.62  LABCREA --  CREAT24HRUR --  CALCIUM 8.6  MG 1.8  PHOS 3.6  PROT 5.2*  ALBUMIN 2.1*  AST 12  ALT 6  ALKPHOS 68  BILITOT 0.2*  BILIDIR --  IBILI --  PREALBUMIN --  TRIG 62  CHOLHDL --  CHOL 102  Corrected calcium=10.1 Estimated Creatinine Clearance: 36.5 ml/min (by C-G formula based on Cr of 0.62).    Insulin Requirements in the past 24 hours:  CBGs < 150, No insulin ordered  Nutritional Goals:  RD recs (10/27): 1300-1500 kcal and 60-80 g protein per day Clinimix E 5/15  at a goal rate of 75ml/hr + IV Fat Emulsion 20% at 12ml/hr on MWF to provide: 84 g/day protein, 1398 Kcal/day avg. (1193 Kcal/day MWF, 1672 Kcal/day STTHS).  Current Nutrition:  Advanced to CL diet on 11/9 but developed recurrent nausea and required NG placed back to suction. Clinimix-E 5/15 at 70 mL/hr Fat Emulsion 20% at 10 mL/hr MWF only  Maintenance IVF:  NS @ 100 ml/hr (MD is managing)  Labs:  Electrolytes: Na slightly low (cannot adjust content in premixed TPN); others WNL.  CBG: controlled (< 150 mg/dL) on no insulin  SCr WNL, stable.  LFTs: all or WNL or low  TGs: WNL  Prealbumin: 11.5 (10/27), 12.8 (10/28), 11.9 (11/4); today's value is pending.     Assessment:  78 y/o admitted 10/21 w/ abdominal pain, vomiting; was found to have SBO.  This was initially managed medically; TNA was started on 10/26.  Ultimately patient consented to surgery and on 11/2 underwent exploratory laparotomy with single band enterolysis.  Postoperative ileus slowly resolving.  Still has NG to suction.  Tolerating TNA at goal rate.  Plan:   Continue Clinimix-E 5/15 at goal rate of 70 mL/hr.  Continue Fat Emulsion 20% at 10 mL/hr on MWF only (due to national shortage).  Multivitamins and trace elements on MWF only (due to Sport and exercise psychologist).  All further maintenance IVF rate adjustments per MD.                             Full TNA labs on Mondays and Thursdays.   Follow up on today's prealbumin result.  Follow up on resolution of ileus and ability to tolerate POs.  Elie Goody, PharmD, BCPS Pager: [phone] 05/02/2012  9:18 AM

## 2012-05-02 NOTE — Progress Notes (Signed)
9 Days Post-Op  Subjective: Still distended, no BM for 2 days, says she hasn't been walked for 3 days.  Objective: Vital signs in last 24 hours: Temp:  [97.7 F (36.5 C)-98.2 F (36.8 C)] 97.7 F (36.5 C) (11/11 0430) Pulse Rate:  [61-70] 63  (11/11 0430) Resp:  [18] 18  (11/11 0430) BP: (119-156)/(66-68) 119/66 mmHg (11/11 0430) SpO2:  [93 %-95 %] 95 % (11/11 0430) Weight:  [89 lb 11.2 oz (40.688 kg)] 89 lb 11.2 oz (40.688 kg) (11/11 0500) Last BM Date: 04/21/12  1250 ml/NG, 480 PO recorded, afebrile, VSS,  Intake/Output from previous day: 11/10 0701 - 11/11 0700 In: 6410 [P.O.:480; I.V.:2150; TPN:3780] Out: 3625 [Urine:2375; Emesis/NG output:1250] Intake/Output this shift: Total I/O In: -  Out: 200 [Urine:100; Emesis/NG output:100]  General appearance: alert, cooperative and just tired and ill. Resp: clear to auscultation bilaterally GI: abd still distended, but softer, she's getting some PO, no nausea.  No BM  Lab Results:   [facility] 05/02/12 0520  WBC 6.4  HGB 8.9*  HCT 27.2*  PLT 258    BMET  Basename 05/02/12 0520  NA 133*  K 4.0  CL 100  CO2 31  GLUCOSE 98  BUN 20  CREATININE 0.62  CALCIUM 8.6   PT/INR  Basename 04/29/12 1100  LABPROT 13.6  INR 1.05     Lab 05/02/12 0520 04/28/12 0505 04/27/12 1200  AST 12 14 13   ALT 6 8 7   ALKPHOS 68 76 70  BILITOT 0.2* 0.1* 0.1*  PROT 5.2* 5.9* 5.4*  ALBUMIN 2.1* 2.2* 2.0*     Lipase     Component Value Date/Time   LIPASE 19 11/05/2007 1455     Studies/Results: No results found.  Medications:    . heparin  5,000 Units Subcutaneous Q8H  . lip balm  1 application Topical BID  . ondansetron (ZOFRAN) IV  4 mg Intravenous Q6H  . pantoprazole (PROTONIX) IV  40 mg Intravenous QHS    Assessment/Plan Small bowel obstruction S/P exploratory lap and lysis of adhesions, 04/23/12 Dr. Daphine Deutscher.  Hypokalemia: likely due to NG output  Hyponatremia improving  Post op ileus  Tobacco use    Malnutrition/TNA prealbumin 11.9   PlaN: dulcolax, get her walking again, sips of clears and clamp tube see how she does.     LOS: 21 days    Zebedee Segundo 05/02/2012

## 2012-05-02 NOTE — Progress Notes (Signed)
I have seen and examined the patient and agree with the assessment and plans.  Chandni Gagan A. Draeden Kellman  MD, FACS  

## 2012-05-02 NOTE — Progress Notes (Addendum)
Nutrition Follow-up  Intervention: TPN per pharmacy. Diet advancement per MD. Will monitor.   Diet Order:  Clear liquid diet  TPN with Clinimix E 5/15 @ 70 ml/hr.  Lipids (20% IVFE @ 10 ml/hr), multivitamins, and trace elements are provided 3 times weekly (MWF) due to national backorder.  Provides 1398 kcal and 84 grams protein daily (based on weekly average).  Meets 107% minimum estimated kcal and 140% minimum estimated protein needs.  Additional IVF with NS @ 100 ml/hr.  - Noted pt unable to tolerate NGT clamping on 11/9. Pt still with abdominal distention, noted plans for Dulcolax and NGT clamping today. No nausea reported. MD noted pt with resolving ileus. Pt did not want any clear liquid breakfast this morning.   - Sodium improving - PALB improved from 11.9mg /dL on 95/6 to 38.$[id number]$$[id number]$ /dL today - CBGs controlled - Albumin low but not likely to improve soon r/t inflammatory process of surgery   Meds: Scheduled Meds:   . [COMPLETED] bisacodyl  10 mg Rectal Once  . heparin  5,000 Units Subcutaneous Q8H  . lip balm  1 application Topical BID  . ondansetron (ZOFRAN) IV  4 mg Intravenous Q6H  . pantoprazole (PROTONIX) IV  40 mg Intravenous QHS   Continuous Infusions:   . sodium chloride 100 mL/hr at 05/02/12 1202  . TPN (CLINIMIX) +/- additives     And  . fat emulsion    . [EXPIRED] TPN (CLINIMIX) +/- additives 70 mL/hr at 04/30/12 1736  . TPN (CLINIMIX) +/- additives 70 mL/hr at 05/02/12 0600  . [DISCONTINUED] sodium chloride 125 mL/hr at 05/01/12 0836   PRN Meds:.antiseptic oral rinse, diphenhydrAMINE, hydrALAZINE, HYDROmorphone (DILAUDID) injection, magic mouthwash, menthol-cetylpyridinium, promethazine, sodium chloride   CMP     Component Value Date/Time   NA 133* 05/02/2012 0520   K 4.0 05/02/2012 0520   CL 100 05/02/2012 0520   CO2 31 05/02/2012 0520   GLUCOSE 98 05/02/2012 0520   BUN 20 05/02/2012 0520   CREATININE 0.62 05/02/2012 0520   CALCIUM 8.6 05/02/2012 0520   PROT 5.2* 05/02/2012 0520   ALBUMIN 2.1* 05/02/2012 0520   AST 12 05/02/2012 0520   ALT 6 05/02/2012 0520   ALKPHOS 68 05/02/2012 0520   BILITOT 0.2* 05/02/2012 0520   GFRNONAA 85* 05/02/2012 0520   GFRAA >90 05/02/2012 0520    CBG (last 3)   Basename 05/02/12 1135 05/02/12 0423 05/01/12 2006  GLUCAP 111* 101* 115*     Intake/Output Summary (Last 24 hours) at 05/02/12 1430 Last data filed at 05/02/12 1055  Gross per 24 hour  Intake   2780 ml  Output   3425 ml  Net   -645 ml   Last BM - 10/19   NGT output - 1,260ml total yesterday, brown   Weight Status:   10/22 85 lb 11/11 89 lb 11.2 oz  Estimated needs:   1300-1500 calories 60-80g protein  Nutrition Dx: Inadequate oral intake r/t inability to eat AEB NPO - no longer appropriate, pt on clear liquid diet.   Nutrition Dx: Inadequate oral intake r/t resolving post-op ileus AEB clear liquid diet.   Goal:   1. Advance diet as tolerated to regular diet - not met, however diet advanced to clear liquid diet.  2. Meet > 90% of estimated energy needs with nutrition support - met.   Monitor:  Weights, labs, TPN, diet advancement, BM   Levon Hedger MS, RD, LDN [phone] Pager [phone] After Hours Pager

## 2012-05-03 LAB — GLUCOSE, CAPILLARY: Glucose-Capillary: 108 mg/dL — ABNORMAL HIGH (ref 70–99)

## 2012-05-03 MED ORDER — CLINIMIX E/DEXTROSE (5/15) 5 % IV SOLN
INTRAVENOUS | Status: AC
  Administered 2012-05-03: 18:00:00 via INTRAVENOUS
  Filled 2012-05-03: qty 2000

## 2012-05-03 NOTE — Progress Notes (Signed)
I have seen and examined the patient and agree with the assessment and plans.  Jonthan Leite A. Judit Awad  MD, FACS  

## 2012-05-03 NOTE — Progress Notes (Signed)
10 Days Post-Op  Subjective: Complaining of pain, but it's more uncomfortable, she is still distended. Some flatus, but not much, no BM with suppository yesterday.  Objective: Vital signs in last 24 hours: Temp:  [98 F (36.7 C)-98.6 F (37 C)] 98 F (36.7 C) (11/12 0603) Pulse Rate:  [64-71] 71  (11/12 0603) Resp:  [16-18] 18  (11/12 0603) BP: (123-144)/(59-79) 128/59 mmHg (11/12 0603) SpO2:  [90 %-94 %] 90 % (11/12 0603) Last BM Date: 05/02/12  80 Po recorded.afebrile, VSS, no labs  Intake/Output from previous day: 11/11 0701 - 11/12 0700 In: 2486.7 [P.O.:80; I.V.:2406.7] Out: 3100 [Urine:2700; Emesis/NG output:400] Intake/Output this shift: Total I/O In: 851.7 [P.O.:120; I.V.:411.7; TPN:320] Out: 400 [Urine:400]  General appearance: alert, cooperative and no distress Resp: clear to auscultation bilaterally GI: soft, but still distended, not tender, minimal flatus, no BM  Lab Results:   [facility] 05/02/12 0520  WBC 6.4  HGB 8.9*  HCT 27.2*  PLT 258    BMET  Basename 05/02/12 0520  NA 133*  K 4.0  CL 100  CO2 31  GLUCOSE 98  BUN 20  CREATININE 0.62  CALCIUM 8.6   PT/INR No results found for this basename: LABPROT:2,INR:2 in the last 72 hours   Lab 05/02/12 0520 04/28/12 0505 04/27/12 1200  AST 12 14 13   ALT 6 8 7   ALKPHOS 68 76 70  BILITOT 0.2* 0.1* 0.1*  PROT 5.2* 5.9* 5.4*  ALBUMIN 2.1* 2.2* 2.0*     Lipase     Component Value Date/Time   LIPASE 19 11/05/2007 1455     Studies/Results: No results found.  Medications:    . heparin  5,000 Units Subcutaneous Q8H  . lip balm  1 application Topical BID  . ondansetron (ZOFRAN) IV  4 mg Intravenous Q6H  . pantoprazole (PROTONIX) IV  40 mg Intravenous QHS    Assessment/Plan Small bowel obstruction S/P exploratory lap and lysis of adhesions, 04/23/12 Dr. Daphine Deutscher.  Hypokalemia: likely due to NG output  Hyponatremia improving  Post op ileus  Tobacco use  Malnutrition/TNA prealbumin 11.9    Plan:  It sounds like she had almost nothing PO yesterday, but no nausea with tube clamped.  i will put her up to regular clear diet and see how she does,  I hate to take out NG with abd still distended. I told family to walk her 4-6 times per day.   LOS: 22 days    Amber Rowland 05/03/2012

## 2012-05-03 NOTE — Progress Notes (Signed)
[facility] PROGRESS NOTE  Amber Rowland ZOX:[id number] DOB: 1933-08-13 DOA: 04/11/2012 PCP: No primary provider on file.  Assessment/Plan:  SBO with post-operative ileus, Patient improving.  Sodium only mildly depressed and patient appears euvolemic.  Less loss of fluids due to emesis and NG tube so less need for aggressive hydration.   -  Agree with excellent management by surgery team  -  Decrease saline to 50 ml/h   Hypertension, blood pressure normal to mildly elevated in the setting of acute illness. Continue hydralazine as needed for severe HTN  COPD, stable. If wheezing, consider albuterol as needed.  Pulmonary nodule, 4mm: Will need follow up in 1 year - primary care doctor may arrange.  Atrophy of left kidney, creatinine stable. Minimize nephrotoxic agents   Signing off.  Please reconsult if any further questions.  Thank you for this consultation.   HPI/Subjective:  Tolerated some liquids today. Flatus +.  Less nausea and stable distension.     Objective: Filed Vitals:   05/02/12 2128 05/03/12 0603 05/03/12 0800 05/03/12 1400  BP: 123/63 128/59  150/65  Pulse: 68 71  68  Temp: 98.6 F (37 C) 98 F (36.7 C)  98.6 F (37 C)  TempSrc: Oral Oral  Oral  Resp: 16 18  22   Height:      Weight:   40.688 kg (89 lb 11.2 oz)   SpO2: 94% 90%  100%    Intake/Output Summary (Last 24 hours) at 05/03/12 1959 Last data filed at 05/03/12 1813  Gross per 24 hour  Intake 3656.85 ml  Output   2700 ml  Net 956.85 ml   Filed Weights   05/01/12 0620 05/02/12 0500 05/03/12 0800  Weight: 37.6 kg (82 lb 14.3 oz) 40.688 kg (89 lb 11.2 oz) 40.688 kg (89 lb 11.2 oz)    Exam:  General: Cachectic caucasian female, no acute distress ENT: NG in place, lips appear moist, tongue is moist Cardiovascular: RRR, 3/6 murmur at the LSB, pectus excavatum, 2+ peripheral pulses  Respiratory: CTAB Abdomen:  NABS, less distended, incision c/d/i, nontender Skin:  No skin tenting on  forehead, appears moist  Musculoskeletal: Decreased tone and bulk, no LEE     Data Reviewed: Basic Metabolic Panel:  Lab 05/02/12 8119 04/28/12 0505 04/27/12 1200  NA 133* 134* 133*  K 4.0 3.9 4.1  CL 100 97 96  CO2 31 31 32  GLUCOSE 98 107* 107*  BUN 20 18 19   CREATININE 0.62 0.55 0.52  CALCIUM 8.6 9.2 9.0  MG 1.8 1.9 --  PHOS 3.6 3.4 --   Liver Function Tests:  Lab 05/02/12 0520 04/28/12 0505 04/27/12 1200  AST 12 14 13   ALT 6 8 7   ALKPHOS 68 76 70  BILITOT 0.2* 0.1* 0.1*  PROT 5.2* 5.9* 5.4*  ALBUMIN 2.1* 2.2* 2.0*   No results found for this basename: LIPASE:5,AMYLASE:5 in the last 168 hours No results found for this basename: AMMONIA:5 in the last 168 hours CBC:  Lab 05/02/12 0520 04/29/12 0415  WBC 6.4 6.5  NEUTROABS 4.2 --  HGB 8.9* 9.4*  HCT 27.2* 28.5*  MCV 93.5 92.5  PLT 258 282   Cardiac Enzymes: No results found for this basename: CKTOTAL:5,CKMB:5,CKMBINDEX:5,TROPONINI:5 in the last 168 hours BNP (last 3 results) No results found for this basename: PROBNP:3 in the last 8760 hours CBG:  Lab 05/03/12 1210 05/03/12 0327 05/02/12 1948 05/02/12 1135 05/02/12 0423  GLUCAP 104* 97 91 111* 101*    No results found for  this or any previous visit (from the past 240 hour(s)).   Studies: No results found.  Scheduled Meds:    . heparin  5,000 Units Subcutaneous Q8H  . lip balm  1 application Topical BID  . ondansetron (ZOFRAN) IV  4 mg Intravenous Q6H  . pantoprazole (PROTONIX) IV  40 mg Intravenous QHS   Continuous Infusions:    . sodium chloride 100 mL/hr at 05/03/12 1719  . [EXPIRED] TPN (CLINIMIX) +/- additives 70 mL/hr at 05/02/12 1744   And  . [EXPIRED] fat emulsion 250 mL (05/02/12 1745)  . TPN (CLINIMIX) +/- additives 70 mL/hr at 05/03/12 1826    Principal Problem:  *SBO (small bowel obstruction) Active Problems:  UTI (lower urinary tract infection)  Chronic constipation  COPD (chronic obstructive pulmonary disease)  Tobacco  abuse  HTN (hypertension)  Benign breast cysts in female  Anxiety  Lung nodules, ?incidental  Hyponatremia  Malnutrition  Ileus following gastrointestinal surgery    Time spent: 20    Tawan Corkern, [facility]  [facility] Pager [phone]. If 8PM-8AM, please contact night-coverage at [email or web address], password [facility] 05/03/2012, 7:59 PM  LOS: 22 days

## 2012-05-03 NOTE — Progress Notes (Signed)
PARENTERAL NUTRITION CONSULT NOTE   Pharmacy Consult for TNA Indication: SBO  No Known Allergies  Patient Measurements: Height: 4\' 9"  (144.8 cm) Weight: 89 lb 11.2 oz (40.688 kg) IBW/kg (Calculated) : 38.6   Vital Signs: Temp: 98 F (36.7 C) (11/12 0603) Temp src: Oral (11/12 0603) BP: 128/59 mmHg (11/12 0603) Pulse Rate: 71  (11/12 0603) Intake/Output from previous day: 11/11 0701 - 11/12 0700 In: 2486.7 [P.O.:80; I.V.:2406.7] Out: 3100 [Urine:2700; Emesis/NG output:400]  Labs:  [facility] 05/02/12 0520  WBC 6.4  HGB 8.9*  HCT 27.2*  PLT 258  APTT --  INR --     Basename 05/02/12 0520  NA 133*  K 4.0  CL 100  CO2 31  GLUCOSE 98  BUN 20  CREATININE 0.62  LABCREA --  CREAT24HRUR --  CALCIUM 8.6  MG 1.8  PHOS 3.6  PROT 5.2*  ALBUMIN 2.1*  AST 12  ALT 6  ALKPHOS 68  BILITOT 0.2*  BILIDIR --  IBILI --  PREALBUMIN 14.2*  TRIG 62  CHOLHDL --  CHOL 102  Corrected calcium=10.1 Estimated Creatinine Clearance: 36.5 ml/min (by C-G formula based on Cr of 0.62).    Insulin Requirements in the past 24 hours:  CBGs < 150, No insulin ordered  Nutritional Goals:  RD recs (10/27): 1300-1500 kcal and 60-80 g protein per day Clinimix E 5/15  at a goal rate of 66ml/hr + IV Fat Emulsion 20% at 86ml/hr on MWF to provide: 84 g/day protein, 1398 Kcal/day avg. (1193 Kcal/day MWF, 1672 Kcal/day STTHS).  Current Nutrition:  Advanced to CL diet on 11/9 but developed recurrent nausea and required NG placed back to suction. Clinimix-E 5/15 at 70 mL/hr Fat Emulsion 20% at 10 mL/hr MWF only  Maintenance IVF:  NS @ 100 ml/hr (MD is managing)  Labs (11/11)  Electrolytes: Na slightly low (cannot adjust content in premixed TPN); others WNL.  CBG: controlled (< 150 mg/dL) on no insulin  SCr WNL, stable.  LFTs: all or WNL or low  TGs: WNL  Prealbumin - Improving: 11.5 (10/27), 12.8 (10/28), 11.9 (11/4); 14.2 (11/11)   Assessment:  78 y/o admitted 10/21 w/  abdominal pain, vomiting; was found to have SBO.  This was initially managed medically; TNA was started on 10/26.  Ultimately patient consented to surgery and on 11/2 underwent exploratory laparotomy with single band enterolysis.  Postoperative ileus slowly resolving.  Still has NG to suction.  Tolerating TNA at goal rate.  Plan:   Continue Clinimix-E 5/15 at goal rate of 70 mL/hr.  Continue Fat Emulsion 20% at 10 mL/hr on MWF only (due to national shortage).  Multivitamins and trace elements on MWF only (due to Sport and exercise psychologist).  All further maintenance IVF rate adjustments per MD.            Full TNA labs on Mondays and Thursdays.   Follow up on resolution of ileus and ability to tolerate POs.  BorgerdingLoma Messing PharmD Pager #: [phone] 9:34 AM 05/03/2012

## 2012-05-04 LAB — GLUCOSE, CAPILLARY
Glucose-Capillary: 109 mg/dL — ABNORMAL HIGH (ref 70–99)
Glucose-Capillary: 103 mg/dL — ABNORMAL HIGH (ref 70–99)

## 2012-05-04 MED ORDER — TRACE MINERALS CR-CU-F-FE-I-MN-MO-SE-ZN IV SOLN
INTRAVENOUS | Status: AC
  Administered 2012-05-04: 18:00:00 via INTRAVENOUS
  Filled 2012-05-04: qty 2000

## 2012-05-04 MED ORDER — FAT EMULSION 20 % IV EMUL
250.0000 mL | INTRAVENOUS | Status: AC
  Administered 2012-05-04: 250 mL via INTRAVENOUS
  Filled 2012-05-04: qty 250

## 2012-05-04 NOTE — Progress Notes (Signed)
I have seen and examined the patient and agree with the assessment and plans. Will leave NG at pt's request  Janissa Bertram A. Magnus Ivan  MD, FACS

## 2012-05-04 NOTE — Progress Notes (Signed)
PARENTERAL NUTRITION CONSULT NOTE   Pharmacy Consult for TNA Indication: SBO  No Known Allergies  Patient Measurements: Height: 4\' 9"  (144.8 cm) Weight: 89 lb 11.2 oz (40.688 kg) IBW/kg (Calculated) : 38.6   Vital Signs: Temp: 98.4 F (36.9 C) (11/13 0522) Temp src: Oral (11/13 0522) BP: 146/69 mmHg (11/13 0522) Pulse Rate: 80  (11/13 0522) Intake/Output from previous day: 11/12 0701 - 11/13 0700 In: 3143.5 [P.O.:360; I.V.:1881.7; TPN:901.8] Out: 400 [Urine:400]  Labs:  [facility] 05/02/12 0520  WBC 6.4  HGB 8.9*  HCT 27.2*  PLT 258  APTT --  INR --     Basename 05/02/12 0520  NA 133*  K 4.0  CL 100  CO2 31  GLUCOSE 98  BUN 20  CREATININE 0.62  LABCREA --  CREAT24HRUR --  CALCIUM 8.6  MG 1.8  PHOS 3.6  PROT 5.2*  ALBUMIN 2.1*  AST 12  ALT 6  ALKPHOS 68  BILITOT 0.2*  BILIDIR --  IBILI --  PREALBUMIN 14.2*  TRIG 62  CHOLHDL --  CHOL 102  Corrected calcium=10.1 Estimated Creatinine Clearance: 36.5 ml/min (by C-G formula based on Cr of 0.62).    Insulin Requirements in the past 24 hours:  CBGs < 150, No insulin ordered  Nutritional Goals:  RD recs (10/27): 1300-1500 kcal and 60-80 g protein per day Clinimix E 5/15  at a goal rate of 47ml/hr + IV Fat Emulsion 20% at 68ml/hr on MWF to provide: 84 g/day protein, 1398 Kcal/day avg. (1193 Kcal/day MWF, 1672 Kcal/day STTHS).  Current Nutrition:  CLD on 11/12  Clinimix-E 5/15 at 70 mL/hr Fat Emulsion 20% at 10 mL/hr MWF only  Maintenance IVF:  NS @ 50 ml/hr (MD is managing)  Labs (11/11)  Electrolytes: Na slightly low (cannot adjust content in premixed TPN); others WNL.  CBG: controlled (< 150 mg/dL) on no insulin  SCr WNL, stable.  LFTs: all or WNL or low  TGs: WNL  Prealbumin - Improving: 11.5 (10/27), 12.8 (10/28), 11.9 (11/4); 14.2 (11/11)   Assessment:  78 y/o admitted 10/21 w/ abdominal pain, vomiting; was found to have SBO.  This was initially managed medically; TNA was  started on 10/26.  Ultimately patient consented to surgery and on 11/2 underwent exploratory laparotomy with single band enterolysis.  Postoperative ileus slowly resolving.  Patient has not been eating much PO, but had no nausea with NG tube clamped.  CLD was restarted on 11/12.  Leaving NG tube in while abdomen still distended.   Tolerating TNA at goal rate.  Plan:   Continue Clinimix-E 5/15 at goal rate of 70 mL/hr.  Continue Fat Emulsion 20% at 10 mL/hr on MWF only (due to national shortage).  Multivitamins and trace elements on MWF only (due to Sport and exercise psychologist).  All further maintenance IVF rate adjustments per MD.            Full TNA labs on Mondays and Thursdays.   Follow up on resolution of ileus and ability to tolerate POs.  BorgerdingLoma Messing PharmD Pager #: [phone] 8:19 AM 05/04/2012

## 2012-05-04 NOTE — Progress Notes (Signed)
11 Days Post-Op  Subjective: More flatus with BM this AM. Still distended, but soft.  Tolerating some clears, worried about the salt and her BP.  Stool was loose and she's concerned about that too.  I offered to take NG out but she wants to wait another day.  Says it was very hard to place last time.  Objective: Vital signs in last 24 hours: Temp:  [98 F (36.7 C)-98.6 F (37 C)] 98.4 F (36.9 C) (11/13 0522) Pulse Rate:  [68-80] 80  (11/13 0522) Resp:  [18-22] 18  (11/13 0522) BP: (136-171)/(65-72) 146/69 mmHg (11/13 0522) SpO2:  [93 %-100 %] 93 % (11/13 0522) Last BM Date: 05/04/12  +BM this AM, 360 PO recorded. Diet: clears and TNA, afebrile, VSS, no labs,  Intake/Output from previous day: 11/12 0701 - 11/13 0700 In: 3143.5 [P.O.:360; I.V.:1881.7; TPN:901.8] Out: 400 [Urine:400] Intake/Output this shift: Total I/O In: -  Out: 400 [Urine:400]  General appearance: alert, cooperative and no distress Resp: clear to auscultation bilaterally GI: soft, still distended, BS are present but hypoactive. Incision looks fine.  Lab Results:   [facility] 05/02/12 0520  WBC 6.4  HGB 8.9*  HCT 27.2*  PLT 258    BMET  Basename 05/02/12 0520  NA 133*  K 4.0  CL 100  CO2 31  GLUCOSE 98  BUN 20  CREATININE 0.62  CALCIUM 8.6   PT/INR No results found for this basename: LABPROT:2,INR:2 in the last 72 hours   Lab 05/02/12 0520 04/28/12 0505 04/27/12 1200  AST 12 14 13   ALT 6 8 7   ALKPHOS 68 76 70  BILITOT 0.2* 0.1* 0.1*  PROT 5.2* 5.9* 5.4*  ALBUMIN 2.1* 2.2* 2.0*     Lipase     Component Value Date/Time   LIPASE 19 11/05/2007 1455     Studies/Results: No results found.  Medications:    . heparin  5,000 Units Subcutaneous Q8H  . lip balm  1 application Topical BID  . ondansetron (ZOFRAN) IV  4 mg Intravenous Q6H  . pantoprazole (PROTONIX) IV  40 mg Intravenous QHS    Assessment/Plan Small bowel obstruction S/P exploratory lap and lysis of adhesions,  04/23/12 Dr. Daphine Deutscher.  Hypokalemia: likely due to NG output  Hyponatremia improving  Post op ileus  Tobacco use  Malnutrition/TNA prealbumin 11.9 --> 14.2   Plan:  Leave her on clears, she only walked twice yesterday, I have ask the nursing student to walk her 4 times before she goes home.  If she does well pull NG in AM and start fulls, start weaning TNA tomorrow. Add cbc to labs in AM.  LOS: 23 days    Kaidon Kinker 05/04/2012

## 2012-05-05 LAB — COMPREHENSIVE METABOLIC PANEL
ALT: 11 U/L (ref 0–35)
Albumin: 2.3 g/dL — ABNORMAL LOW (ref 3.5–5.2)
Alkaline Phosphatase: 83 U/L (ref 39–117)
BUN: 19 mg/dL (ref 6–23)
Calcium: 9.1 mg/dL (ref 8.4–10.5)
GFR calc Af Amer: >90 mL/min (ref >90)
Potassium: 3.8 mEq/L (ref 3.5–5.1)
Sodium: 135 mEq/L (ref 135–145)
Total Protein: 5.6 g/dL — ABNORMAL LOW (ref 6.0–8.3)

## 2012-05-05 LAB — CBC
Hemoglobin: 9.1 g/dL — ABNORMAL LOW (ref 12.0–15.0)
MCH: 30.7 pg (ref 26.0–34.0)
Platelets: 242 10*3/uL (ref 150–400)
RBC: 2.96 MIL/uL — ABNORMAL LOW (ref 3.87–5.11)
WBC: 7.1 10*3/uL (ref 4.0–10.5)

## 2012-05-05 LAB — GLUCOSE, CAPILLARY: Glucose-Capillary: 115 mg/dL — ABNORMAL HIGH (ref 70–99)

## 2012-05-05 LAB — MAGNESIUM: Magnesium: 1.9 mg/dL (ref 1.5–2.5)

## 2012-05-05 MED ORDER — CLINIMIX E/DEXTROSE (5/15) 5 % IV SOLN
INTRAVENOUS | Status: AC
  Administered 2012-05-05: 18:00:00 via INTRAVENOUS
  Filled 2012-05-05: qty 2000

## 2012-05-05 NOTE — Progress Notes (Signed)
PARENTERAL NUTRITION CONSULT NOTE   Pharmacy Consult for TNA Indication: SBO  No Known Allergies  Patient Measurements: Height: 4\' 9"  (144.8 cm) Weight: 86 lb (39.009 kg) IBW/kg (Calculated) : 38.6   Vital Signs: Temp: 98.5 F (36.9 C) (11/14 0530) Temp src: Oral (11/14 0530) BP: 126/68 mmHg (11/14 0530) Pulse Rate: 69  (11/14 0530) Intake/Output from previous day: 11/13 0701 - 11/14 0700 In: 2315.8 [P.O.:480; I.V.:1200; TPN:635.8] Out: 2200 [Urine:2200]  Labs:  [facility] 05/05/12 0535  WBC 7.1  HGB 9.1*  HCT 27.8*  PLT 242  APTT --  INR --     Basename 05/05/12 0535  NA 135  K 3.8  CL 99  CO2 31  GLUCOSE 95  BUN 19  CREATININE 0.64  LABCREA --  CREAT24HRUR --  CALCIUM 9.1  MG 1.9  PHOS 3.6  PROT 5.6*  ALBUMIN 2.3*  AST 18  ALT 11  ALKPHOS 83  BILITOT 0.2*  BILIDIR --  IBILI --  PREALBUMIN --  TRIG --  CHOLHDL --  CHOL --  Corrected calcium=10.1 Estimated Creatinine Clearance: 36.5 ml/min (by C-G formula based on Cr of 0.64).    Insulin Requirements in the past 24 hours:  CBGs < 150, No insulin ordered  Nutritional Goals:  RD recs (10/27): 1300-1500 kcal and 60-80 g protein per day Clinimix E 5/15  at a goal rate of 101ml/hr + IV Fat Emulsion 20% at 63ml/hr on MWF to provide: 84 g/day protein, 1398 Kcal/day avg. (1193 Kcal/day MWF, 1672 Kcal/day STTHS).  Current Nutrition:  CLD on 11/12  Clinimix-E 5/15 at 70 mL/hr Fat Emulsion 20% at 10 mL/hr MWF only  Maintenance IVF:  NS @ 50 ml/hr (MD is managing)  Labs (11/11)  Electrolytes: wnl  SCr WNL, stable.  LFTs: all or WNL or low  TGs: WNL  Prealbumin - Improving: 11.5 (10/27), 12.8 (10/28), 11.9 (11/4); 14.2 (11/11)   Assessment:  78 y/o admitted 10/21 w/ abdominal pain, vomiting; was found to have SBO.  This was initially managed medically; TNA was started on 10/26.  Ultimately patient consented to surgery and on 11/2 underwent exploratory laparotomy with single band  enterolysis.  Postoperative ileus slowly resolving.  Patient has not been eating much PO, but had no nausea with NG tube clamped. Discontinue NG tube today, Full Liquid diet  Tolerating TNA at goal rate.  Plan:   Continue Clinimix-E 5/15 at goal rate of 70 mL/hr.  Continue Fat Emulsion 20% at 10 mL/hr on MWF only (due to national shortage).  Multivitamins and trace elements on MWF only (due to Sport and exercise psychologist).  All further maintenance IVF rate adjustments per MD.            Full TNA labs on Mondays and Thursdays.   Begin TNA wean tomorrow if tolerates full liquids.  Otho Bellows PharmD Pager #: [phone] 10:41 AM 05/05/2012

## 2012-05-05 NOTE — Care Management Note (Signed)
    Page 1 of 2   05/05/2012     2:29:05 PM   CARE MANAGEMENT NOTE 05/05/2012  Patient:  Amber Rowland   Account Number:  [id number]  Date Initiated:  04/18/2012  Documentation initiated by:  Colleen Can  Subjective/Objective Assessment:   dx SBO, malnutrition, hypokalemia     Action/Plan:   Cm spoke with patient. Pt is from home in [place]. Current plans are for her to go to daughter's home in [facility] where daughter and son-in-law will be caregivers upon discharge from hospital. Currently is ambulatory   Anticipated DC Date:  05/09/2012   Anticipated DC Plan:  HOME/SELF CARE      DC Planning Services  CM consult      Choice offered to / List presented to:             Status of service:  Completed, signed off Medicare Important Message given?   (If response is "NO", the following Medicare IM given date fields will be blank) Date Medicare IM given:   Date Additional Medicare IM given:    Discharge Disposition:  HOME/SELF CARE  Per UR Regulation:  Reviewed for med. necessity/level of care/duration of stay  If discussed at Long Length of Stay Meetings, dates discussed:   04/19/2012  04/26/2012  04/28/2012    Comments:  05-05-12 Lorenda Ishihara RN CM 1425 Spoke with dtr, Elease Hashimoto at [phone] regarding d/c plans per patient request. Dtr plans to take patient home with her. She feels she will be able to care for her without [facility] services. She has visited several times and is aware of patient's limitations and feels comfortable with caring for her at this time. Continue to advance diet and await return of bowel function. Ancitipate weaning TNA once PO intake improved. Will continue to follow for d/c needs.  04/22/2012 Damaris Schooner RN CCM [phone] SBO remains unresolved; con't to have NG to suction, TPN, IV flds/recommendations are for surgery from attending MD/CM will follow for needs.  04/18/2012 Raynelle Bring BSN CCM [phone] Pt con't with NG in  place. NPO x ice chips. on Tpn and IV flds. KUB today. CM will con't to follow for  needs.

## 2012-05-05 NOTE — Progress Notes (Signed)
12 Days Post-Op  Subjective: Reports some nausea last pm and hooked up less than 15 ml in cannister.  No vomiting, Abd still somewhat distended.  Objective: Vital signs in last 24 hours: Temp:  [97.9 F (36.6 C)-98.6 F (37 C)] 98.5 F (36.9 C) (11/14 0530) Pulse Rate:  [68-71] 69  (11/14 0530) Resp:  [18] 18  (11/14 0530) BP: (126-182)/(68-83) 126/68 mmHg (11/14 0530) SpO2:  [93 %-95 %] 93 % (11/14 0530) Weight:  [86 lb (39.009 kg)-89 lb 6.4 oz (40.552 kg)] 86 lb (39.009 kg) (11/14 0500) Last BM Date: 05/04/12  480 PO recorded, +BM recorded, afebrile, VSS, Labs OK  Intake/Output from previous day: 11/13 0701 - 11/14 0700 In: 2315.8 [P.O.:480; I.V.:1200; TPN:635.8] Out: 2200 [Urine:2200] Intake/Output this shift: Total I/O In: 882 [I.V.:882] Out: -   General appearance: alert, cooperative and no distress Resp: clear to auscultation bilaterally GI: soft, still distended some, +BS, +flatus, +BM(loose stool yesterday)  Lab Results:   [facility] 05/05/12 0535  WBC 7.1  HGB 9.1*  HCT 27.8*  PLT 242    BMET  Basename 05/05/12 0535  NA 135  K 3.8  CL 99  CO2 31  GLUCOSE 95  BUN 19  CREATININE 0.64  CALCIUM 9.1   PT/INR No results found for this basename: LABPROT:2,INR:2 in the last 72 hours   Lab 05/05/12 0535 05/02/12 0520  AST 18 12  ALT 11 6  ALKPHOS 83 68  BILITOT 0.2* 0.2*  PROT 5.6* 5.2*  ALBUMIN 2.3* 2.1*     Lipase     Component Value Date/Time   LIPASE 19 11/05/2007 1455     Studies/Results: No results found.  Medications:    . heparin  5,000 Units Subcutaneous Q8H  . lip balm  1 application Topical BID  . ondansetron (ZOFRAN) IV  4 mg Intravenous Q6H  . pantoprazole (PROTONIX) IV  40 mg Intravenous QHS    Assessment/Plan Small bowel obstruction S/P exploratory lap and lysis of adhesions, 04/23/12 Dr. Daphine Deutscher.  Hypokalemia: likely due to NG output  Hyponatremia improving  Post op ileus  Tobacco use  Malnutrition/TNA prealbumin  11.9 --> 14.2   Plan:  i have d/ced the NG, I will put her up to full liquids and see how she does, if she tolerates begin weaning TNA tomorrow.  LOS: 24 days    Amber Rowland 05/05/2012

## 2012-05-05 NOTE — Progress Notes (Signed)
I have seen and examined the patient and agree with the assessment and plans. NG out today  Priti Consoli A. Magnus Ivan  MD, FACS

## 2012-05-06 MED ORDER — FAT EMULSION 20 % IV EMUL
240.0000 mL | INTRAVENOUS | Status: DC
  Filled 2012-05-06: qty 250

## 2012-05-06 MED ORDER — SIMETHICONE 80 MG PO CHEW
80.0000 mg | CHEWABLE_TABLET | Freq: Four times a day (QID) | ORAL | Status: DC | PRN
  Administered 2012-05-07 – 2012-05-10 (×2): 80 mg via ORAL
  Filled 2012-05-06 (×2): qty 1

## 2012-05-06 MED ORDER — TRACE MINERALS CR-CU-F-FE-I-MN-MO-SE-ZN IV SOLN
INTRAVENOUS | Status: DC
  Filled 2012-05-06: qty 1000

## 2012-05-06 MED ORDER — HYDROCODONE-ACETAMINOPHEN 5-325 MG PO TABS
1.0000 | ORAL_TABLET | ORAL | Status: DC | PRN
  Administered 2012-05-06 – 2012-05-10 (×8): 1 via ORAL
  Filled 2012-05-06 (×8): qty 1

## 2012-05-06 MED ORDER — ENSURE COMPLETE PO LIQD
237.0000 mL | Freq: Two times a day (BID) | ORAL | Status: DC
  Administered 2012-05-07 – 2012-05-10 (×5): 237 mL via ORAL

## 2012-05-06 NOTE — Progress Notes (Signed)
I have seen and examined the patient and agree with the assessment and plans. Continues to improve Will ween TNA off  Rayquon Uselman A. Magnus Ivan  MD, FACS

## 2012-05-06 NOTE — Progress Notes (Signed)
Nutrition Brief Note  Diet: Low fiber  - Noted plans to wean TPN tonight and d/c tomorrow. Met with pt who reports tolerating liquids well - has been consuming coffee and apple juice without any nausea or pain. Assisted pt with ordering lunch today - she was ready to try some soup. Provided Ensure for additional nutrition - will order. Pt's only c/o is feeling very gassy which RN is aware of. Will continue to monitor.   Levon Hedger MS, RD, LDN [phone] Pager [phone] After Hours Pager

## 2012-05-06 NOTE — Progress Notes (Signed)
13 Days Post-Op  Subjective: Remains anxious concerning NG, But is + Flatus and BS abdomen remains distended but is soft and non tender. tolerated full liquids w/o problem.  Objective: Vital signs in last 24 hours: Temp:  [98.1 F (36.7 C)-99.3 F (37.4 C)] 98.1 F (36.7 C) (11/15 0616) Pulse Rate:  [65-69] 69  (11/15 0616) Resp:  [18] 18  (11/15 0616) BP: (133-169)/(63-91) 133/80 mmHg (11/15 0616) SpO2:  [94 %-97 %] 96 % (11/15 0616) Weight:  [86 lb 4.8 oz (39.145 kg)] 86 lb 4.8 oz (39.145 kg) (11/15 0500) Last BM Date: 05/04/12  Intake/Output from previous day: 11/14 0701 - 11/15 0700 In: 4970.2 [P.O.:240; I.V.:1946.2; IV Piggyback:40; TPN:2744] Out: 500 [Urine:500] Intake/Output this shift:    General appearance: alert, cooperative, appears stated age and no distress Chest: CTA Cardiac: RRR Abdomen: soft, appears slightly distended, + BS,Flatus BM yesterday. Has been tolerating diet. On TNA (weaning) Extremities: warm to touch, no edema or tenderness + Pulses.  Lab Results:   [facility] 05/05/12 0535  WBC 7.1  HGB 9.1*  HCT 27.8*  PLT 242   BMET  Basename 05/05/12 0535  NA 135  K 3.8  CL 99  CO2 31  GLUCOSE 95  BUN 19  CREATININE 0.64  CALCIUM 9.1   PT/INR No results found for this basename: LABPROT:2,INR:2 in the last 72 hours ABG No results found for this basename: PHART:2,PCO2:2,PO2:2,HCO3:2 in the last 72 hours  Studies/Results: No results found.  Anti-infectives: Anti-infectives     Start     Dose/Rate Route Frequency Ordered Stop   04/12/12 0445   cefTRIAXone (ROCEPHIN) 1 g in dextrose 5 % 50 mL IVPB        1 g 100 mL/hr over 30 Minutes Intravenous  Once 04/12/12 0435 04/12/12 0615          Assessment/Plan: Patient Active Problem List  Diagnosis  . UTI (lower urinary tract infection)  . Chronic constipation  . COPD (chronic obstructive pulmonary disease)  . Tobacco abuse  . SBO (small bowel obstruction)  . HTN (hypertension)  .  Benign breast cysts in female  . Anxiety  . Lung nodules, ?incidental  . Hyponatremia  . Malnutrition  . Ileus following gastrointestinal surgery   s/p Procedure(s) (LRB) with comments: EXPLORATORY LAPAROTOMY (N/A) - enterolysis  1. Will advance diet to low residue 2. Wean TNA (possibly DC tomorrow) 3. Continue to encourage ambulation/OOB 4. Follow clinical presentation   LOS: 25 days    Golda Acre [facility] Pager # [phone]  05/06/2012

## 2012-05-06 NOTE — Progress Notes (Addendum)
PARENTERAL NUTRITION CONSULT NOTE   Pharmacy Consult for TNA Indication: SBO  No Known Allergies  Patient Measurements: Height: 4\' 9"  (144.8 cm) Weight: 86 lb 4.8 oz (39.145 kg) IBW/kg (Calculated) : 38.6   Vital Signs: Temp: 98.1 F (36.7 C) (11/15 0616) Temp src: Oral (11/15 0616) BP: 133/80 mmHg (11/15 0616) Pulse Rate: 69  (11/15 0616) Intake/Output from previous day: 11/14 0701 - 11/15 0700 In: 4970.2 [P.O.:240; I.V.:1946.2; IV Piggyback:40; WJX:9147] Out: 500 [Urine:500]  Labs:  [facility] 05/05/12 0535  WBC 7.1  HGB 9.1*  HCT 27.8*  PLT 242  APTT --  INR --     Basename 05/05/12 0535  NA 135  K 3.8  CL 99  CO2 31  GLUCOSE 95  BUN 19  CREATININE 0.64  LABCREA --  CREAT24HRUR --  CALCIUM 9.1  MG 1.9  PHOS 3.6  PROT 5.6*  ALBUMIN 2.3*  AST 18  ALT 11  ALKPHOS 83  BILITOT 0.2*  BILIDIR --  IBILI --  PREALBUMIN --  TRIG --  CHOLHDL --  CHOL --  Corrected calcium=10.4 Estimated Creatinine Clearance: 36.5 ml/min (by C-G formula based on Cr of 0.64).    Insulin Requirements in the past 24 hours:  CBGs < 150, No insulin ordered  Nutritional Goals:  RD recs (10/27): 1300-1500 kcal and 60-80 g protein per day Clinimix E 5/15  at a goal rate of 37ml/hr + IV Fat Emulsion 20% at 86ml/hr on MWF to provide: 84 g/day protein, 1398 Kcal/day avg. (1193 Kcal/day MWF, 1672 Kcal/day STTHS).  Current Nutrition:  FL on 11/14 - tolerating now and diet advanced further today Clinimix-E 5/15 at 70 mL/hr - reduced to 50 ml/hr by NP today Fat Emulsion 20% at 10 mL/hr MWF only  Maintenance IVF:  NS @ 50 ml/hr (MD is managing)  Labs (11/11)  Electrolytes: wnl  SCr WNL, stable.  LFTs: all or WNL or low  TGs: WNL  Prealbumin - Improving: 11.5 (10/27), 12.8 (10/28), 11.9 (11/4); 14.2 (11/11)   Assessment:  78 y/o admitted 10/21 w/ abdominal pain, vomiting; was found to have SBO.  This was initially managed medically; TNA was started on 10/26.   Ultimately patient consented to surgery and on 11/2 underwent exploratory laparotomy with single band enterolysis.  Postoperative ileus slowly resolving.  Diet is being advanced and TNA rate reduced this AM from 70 ml/hr to 50 ml/hr with plan to continue TNA tonight thru tomorrow and likely d/c tomorrow per discussion with NP  Plan:   Continue Clinimix E 5/15 at 50 m/hr until 1800 then reduce to 40 ml/hr with new bag.  Continue Fat Emulsion 20% at 10 mL/hr on MWF only (due to national shortage).  Multivitamins and trace elements on MWF only (due to Sport and exercise psychologist).  All further maintenance IVF rate adjustments per MD.            Full TNA labs on Mondays and Thursdays.   Plan as above to continue TNA thru tomorrow and likely d/c tomorrow   Hessie Knows, PharmD, BCPS Pager [phone] 05/06/2012 8:51 AM       Addendum:   A/P: Will now just turn off TNA at 1800 per Md orders. No further need in TNA rate reduction necessary.   Hessie Knows, PharmD, BCPS Pager [phone] 05/06/2012 11:15 AM

## 2012-05-07 MED ORDER — ONDANSETRON HCL 4 MG/2ML IJ SOLN: 4.0000 mg | Freq: Four times a day (QID) | INTRAMUSCULAR | Status: DC | PRN

## 2012-05-07 MED ORDER — PANTOPRAZOLE SODIUM 40 MG PO TBEC
40.0000 mg | DELAYED_RELEASE_TABLET | Freq: Every day | ORAL | Status: DC
  Administered 2012-05-07 – 2012-05-09 (×3): 40 mg via ORAL
  Filled 2012-05-07 (×4): qty 1

## 2012-05-07 NOTE — Progress Notes (Signed)
  Pharmacy Note: IV --> PO Conversion  This patient is receiving IV Protonix. Based on criteria approved by the Pharmacy and Therapeutics Committee, this medication is being converted to the equivalent oral dose form. These criteria include:   . The patient is eating (either orally or per tube) and/or has been taking other orally administered medications for at least 24 hours.  . This patient has no evidence of active gastrointestinal bleeding or impaired GI absorption (gastrectomy, short bowel, patient on TNA or NPO).   If you have questions about this conversion, please contact the pharmacy department at [phone].  Darrol Angel, PharmD Pager: [phone] 05/07/2012 1:01 PM

## 2012-05-07 NOTE — Progress Notes (Signed)
14 Days Post-Op  Subjective: Tolerating diet.  No nausea or vomiting.  C/o gas pains and flatus.  Diarrhea yesterday  Objective: Vital signs in last 24 hours: Temp:  [97.7 F (36.5 C)-98.8 F (37.1 C)] 97.7 F (36.5 C) (11/16 0443) Pulse Rate:  [65-71] 65  (11/16 0443) Resp:  [18-20] 18  (11/16 0443) BP: (126-156)/(56-76) 153/76 mmHg (11/16 0443) SpO2:  [92 %-93 %] 92 % (11/16 0443) Weight:  [86 lb 4.8 oz (39.145 kg)] 86 lb 4.8 oz (39.145 kg) (11/16 0443) Last BM Date: 05/06/12  Intake/Output from previous day: 11/15 0701 - 11/16 0700 In: 1672.7 [P.O.:390; I.V.:609.5; TPN:673.2] Out: 450 [Urine:450] Intake/Output this shift: Total I/O In: 240 [P.O.:240] Out: -   General appearance: alert, cooperative and no distress GI: soft, nt, abdomen still distended but not tympanitic (chronic??), wound without infection, no peritoneal signs  Lab Results:   [facility] 05/05/12 0535  WBC 7.1  HGB 9.1*  HCT 27.8*  PLT 242   BMET  Basename 05/05/12 0535  NA 135  K 3.8  CL 99  CO2 31  GLUCOSE 95  BUN 19  CREATININE 0.64  CALCIUM 9.1   PT/INR No results found for this basename: LABPROT:2,INR:2 in the last 72 hours ABG No results found for this basename: PHART:2,PCO2:2,PO2:2,HCO3:2 in the last 72 hours  Studies/Results: No results found.  Anti-infectives: Anti-infectives     Start     Dose/Rate Route Frequency Ordered Stop   04/12/12 0445   cefTRIAXone (ROCEPHIN) 1 g in dextrose 5 % 50 mL IVPB        1 g 100 mL/hr over 30 Minutes Intravenous  Once 04/12/12 0435 04/12/12 0615          Assessment/Plan: s/p Procedure(s) (LRB) with comments: EXPLORATORY LAPAROTOMY (N/A) - enterolysis she seems to be doing okay but her abdomen remains distended.  I think that this is likely chronic distension.  She may be ready for discharge tomorrow if she continues to tolerate diet and bowels continue to function.   LOS: 26 days    Lodema Pilot DAVID 05/07/2012

## 2012-05-08 MED ORDER — BISACODYL 10 MG RE SUPP
10.0000 mg | Freq: Once | RECTAL | Status: AC
  Administered 2012-05-08: 10 mg via RECTAL
  Filled 2012-05-08: qty 1

## 2012-05-08 MED ORDER — POLYETHYLENE GLYCOL 3350 17 G PO PACK
17.0000 g | PACK | Freq: Two times a day (BID) | ORAL | Status: DC | PRN
  Administered 2012-05-08: 17 g via ORAL
  Filled 2012-05-08: qty 1

## 2012-05-08 NOTE — Progress Notes (Signed)
15 Days Post-Op  Subjective: Tolerating diet.  No nausea or vomiting.  C/o gas pains and flatus and "constipation".    Objective: Vital signs in last 24 hours: Temp:  [97.8 F (36.6 C)-98.5 F (36.9 C)] 97.8 F (36.6 C) (11/17 0603) Pulse Rate:  [63-73] 72  (11/17 0603) Resp:  [16-18] 16  (11/17 0603) BP: (118-147)/(58-115) 142/88 mmHg (11/17 0616) SpO2:  [95 %-98 %] 98 % (11/17 0603) Weight:  [81 lb 1.6 oz (36.787 kg)] 81 lb 1.6 oz (36.787 kg) (11/17 0658) Last BM Date: 05/06/12  Intake/Output from previous day: 11/16 0701 - 11/17 0700 In: 1080 [P.O.:1080] Out: -  Intake/Output this shift: Total I/O In: 240 [P.O.:240] Out: -   General appearance: alert, cooperative and no distress GI: soft, nt, abdomen still distended, wound without infection, no peritoneal signs  Lab Results:  No results found for this basename: WBC:2,HGB:2,HCT:2,PLT:2 in the last 72 hours BMET No results found for this basename: NA:2,K:2,CL:2,CO2:2,GLUCOSE:2,BUN:2,CREATININE:2,CALCIUM:2 in the last 72 hours PT/INR No results found for this basename: LABPROT:2,INR:2 in the last 72 hours ABG No results found for this basename: PHART:2,PCO2:2,PO2:2,HCO3:2 in the last 72 hours  Studies/Results: No results found.  Anti-infectives: Anti-infectives     Start     Dose/Rate Route Frequency Ordered Stop   04/12/12 0445   cefTRIAXone (ROCEPHIN) 1 g in dextrose 5 % 50 mL IVPB        1 g 100 mL/hr over 30 Minutes Intravenous  Once 04/12/12 0435 04/12/12 0615          Assessment/Plan: s/p Procedure(s) (LRB) with comments: EXPLORATORY LAPAROTOMY (N/A) - enterolysis she seems to be doing okay but her abdomen remains distended.  She feel constipated.  I have ordered her a suppository and some miralax.  She does not have a ride home until Tues  LOS: 27 days    Amber Rowland C. 05/08/2012

## 2012-05-08 NOTE — Progress Notes (Signed)
Patient encouraged and explained of need to ambulate and benefits.  Patient assisted to ambulate in the hallway x2 laps with walker with standby assist.  Patient tolerated well.  Patient encouraged to ambulation is more about frequency vs distance ambulation.  Patient verbalizes understanding.  Call light within reach.

## 2012-05-08 NOTE — Progress Notes (Signed)
Encouraged to ambulate frequently today. Up ad lib in room and in chair. Refused to get out in hall, stated "too cold".

## 2012-05-09 NOTE — Progress Notes (Signed)
Mildly nauseated, but "I ate too much this morning" Flatus, but no BM today Abd - very soft; non-tender Incision c/d/i  Will recheck in morning - possible discharge tomorrow. Will likely need to eat 5-6 small meals each day rather than 3 regular-size meals.  Wilmon Arms. Corliss Skains, MD, [facility]  05/09/2012 1:35 PM

## 2012-05-09 NOTE — Progress Notes (Signed)
Patient ID: Amber Rowland, female   DOB: April 26, 1934, 78 y.o.   MRN: [id number] 16 Days Post-Op  Subjective: Without complaints, tolerating diet well, ambulating well.  Objective: Vital signs in last 24 hours: Temp:  [97.9 F (36.6 C)-98.3 F (36.8 C)] 97.9 F (36.6 C) (11/18 0443) Pulse Rate:  [65-82] 80  (11/18 0443) Resp:  [18] 18  (11/18 0443) BP: (132-168)/(70-75) 145/73 mmHg (11/18 0443) SpO2:  [94 %-100 %] 97 % (11/18 0443) Weight:  [78 lb 12.8 oz (35.743 kg)] 78 lb 12.8 oz (35.743 kg) (11/18 0456) Last BM Date: 05/08/12  Intake/Output from previous day: 11/17 0701 - 11/18 0700 In: 720 [P.O.:720] Out: -  Intake/Output this shift: Total I/O In: 120 [P.O.:120] Out: -   General: alert, cooperative and no distress GI: soft, nt, abdomen still distended, wound without infection, retention sutures in place, staples in place  Lab Results:  No results found for this basename: WBC:2,HGB:2,HCT:2,PLT:2 in the last 72 hours BMET No results found for this basename: NA:2,K:2,CL:2,CO2:2,GLUCOSE:2,BUN:2,CREATININE:2,CALCIUM:2 in the last 72 hours PT/INR No results found for this basename: LABPROT:2,INR:2 in the last 72 hours ABG No results found for this basename: PHART:2,PCO2:2,PO2:2,HCO3:2 in the last 72 hours  Studies/Results: No results found.  Anti-infectives: Anti-infectives     Start     Dose/Rate Route Frequency Ordered Stop   04/12/12 0445   cefTRIAXone (ROCEPHIN) 1 g in dextrose 5 % 50 mL IVPB        1 g 100 mL/hr over 30 Minutes Intravenous  Once 04/12/12 0435 04/12/12 0615          Assessment/Plan: 1. POD#16-exp lap with enterolysis: doing well from surgical standpoint, tolerating diet and pain controlled, mobilizing well  --planning to go home with daughter tomorrow  --home health PT to increase strength  --will d/c staples tomorrow, will ask Dr. Corliss Skains about retention sutures.  LOS: 28 days    Eran Windish, Nikya 05/09/2012

## 2012-05-10 ENCOUNTER — Telehealth (INDEPENDENT_AMBULATORY_CARE_PROVIDER_SITE_OTHER): Payer: Self-pay | Admitting: General Surgery

## 2012-05-10 MED ORDER — POLYETHYLENE GLYCOL 3350 17 G PO PACK: 17.0000 g | PACK | Freq: Two times a day (BID) | ORAL | Status: DC | PRN

## 2012-05-10 MED ORDER — HYDROCODONE-ACETAMINOPHEN 5-325 MG PO TABS
1.0000 | ORAL_TABLET | Freq: Four times a day (QID) | ORAL | Status: DC | PRN
Start: 2012-05-10 — End: 2012-06-10

## 2012-05-10 NOTE — Telephone Encounter (Signed)
Tried to call pt and let her know her first PO appt will be on 12/13 at 10:50. She was unavailable.

## 2012-05-10 NOTE — Discharge Summary (Signed)
Addendum:  Slow wound healing - no obvious dehiscence, but slight gapping of skin edges.  Leave staples one more week.  Wilmon Arms. Corliss Skains, MD, [facility]  05/10/2012 11:21 AM

## 2012-05-10 NOTE — Progress Notes (Signed)
Pt ride arrived discharge instructions given along with prescription for miralax and norco teach back done Pt and family verbalizes understanding will monitor.

## 2012-05-10 NOTE — Progress Notes (Signed)
Pt is awaiting discharge. Daughter Drue Dun was scheduled  to pick Pt up from the hospital at 1900. Discharge instructions completed. Pt quietly waiting on ride home in her room.  V/s stable no c/o pain. Call Daughter and left voice message on her phone at [phone] per Pt. Will continue to monitor.

## 2012-05-10 NOTE — Discharge Instructions (Signed)
[facility]      [facility] [phone]  OPEN ABDOMINAL SURGERY: POST OP INSTRUCTIONS  Always review your discharge instruction sheet given to you by the facility where your surgery was performed.  IF YOU HAVE DISABILITY OR FAMILY LEAVE FORMS, YOU MUST BRING THEM TO THE OFFICE FOR PROCESSING.  PLEASE DO NOT GIVE THEM TO YOUR DOCTOR.  1. A prescription for pain medication may be given to you upon discharge.  Take your pain medication as prescribed, if needed.  If narcotic pain medicine is not needed, then you may take acetaminophen (Tylenol) or ibuprofen (Advil) as needed. 2. Take your usually prescribed medications unless otherwise directed. 3. If you need a refill on your pain medication, please contact your pharmacy. They will contact our office to request authorization.  Prescriptions will not be filled after 5pm or on week-ends. 4. You should follow a light diet the first few days after arrival home, such as soup and crackers, pudding, etc.unless your doctor has advised otherwise. A high-fiber, low fat diet can be resumed as tolerated.   Be sure to include lots of fluids daily. Most patients will experience some swelling and bruising on the chest and neck area.  Ice packs will help.  Swelling and bruising can take several days to resolve 5. Most patients will experience some swelling and bruising in the area of the incision. Ice pack will help. Swelling and bruising can take several days to resolve..  6. It is common to experience some constipation if taking pain medication after surgery.  Increasing fluid intake and taking a stool softener will usually help or prevent this problem from occurring.  A mild laxative (Milk of Magnesia or Miralax) should be taken according to package directions if there are no bowel movements after 48 hours. 7.  You may have steri-strips (small skin tapes) in place directly over the incision.  These strips should be left on the skin for 7-10 days.  If your  surgeon used skin glue on the incision, you may shower in 24 hours.  The glue will flake off over the next 2-3 weeks.  Any sutures or staples will be removed at the office during your follow-up visit. You may find that a light gauze bandage over your incision may keep your staples from being rubbed or pulled. You may shower and replace the bandage daily. 8. ACTIVITIES:  You may resume regular (light) daily activities beginning the next day--such as daily self-care, walking, climbing stairs--gradually increasing activities as tolerated.  You may have sexual intercourse when it is comfortable.  Refrain from any heavy lifting or straining until approved by your doctor. a. You may drive when you no longer are taking prescription pain medication, you can comfortably wear a seatbelt, and you can safely maneuver your car and apply brakes b. Return to Work: ___________________________________ 9. You should see your doctor in the office for a follow-up appointment approximately two weeks after your surgery.  Make sure that you call for this appointment within a day or two after you arrive home to insure a convenient appointment time. OTHER INSTRUCTIONS:  _____________________________________________________________ _____________________________________________________________  WHEN TO CALL YOUR DOCTOR: 1. Fever over 101.0 2. Inability to urinate 3. Nausea and/or vomiting 4. Extreme swelling or bruising 5. Continued bleeding from incision. 6. Increased pain, redness, or drainage from the incision. 7. Difficulty swallowing or breathing 8. Muscle cramping or spasms. 9. Numbness or tingling in hands or feet or around lips.  The clinic staff is available to   answer your questions during regular business hours.  Please don't hesitate to call and ask to speak to one of the nurses if you have concerns.  For further questions, please visit [email or web address]   

## 2012-05-10 NOTE — Progress Notes (Signed)
Per MD order, PICC line removed. Cath intact at 33cm. Vaseline pressure gauze to site, pressure held x 5min. No bleeding to site. Pt instructed to keep dressing CDI x 24 hours. Avoid heavy lifting, pushing or pulling x 24 hours,  If bleeding occurs hold pressure, if bleeding does not stop contact MD or go to the ED. Pt does not have any questions. Tabor Denham M 

## 2012-05-10 NOTE — Progress Notes (Signed)
Amber Rowland, [name] states she removed retention sutures but still has a small gap and wants staples to remain, patient will return to office to have staples removed and to disreguard that order. Patient wants to wait for discharge instructions until daughter gets here tonight.

## 2012-05-10 NOTE — Discharge Summary (Signed)
Agree with above Sutures/ staples removed.  Ready for discharge.  Wilmon Arms. Corliss Skains, MD, [facility]  05/10/2012 10:52 AM

## 2012-05-10 NOTE — Discharge Summary (Addendum)
Physician Discharge Summary  Patient ID: Amber Rowland MRN: [id number] DOB/AGE: [birth year] 78 y.o.  Admit date: 04/11/2012 Discharge date: 05/10/2012  Admitting Diagnosis: Abdominal pain Nausea and vomiting SBO  Discharge Diagnosis Patient Active Problem List   Diagnosis Date Noted  . Malnutrition 04/28/2012  . Ileus following gastrointestinal surgery 04/28/2012  . Hyponatremia 04/27/2012  . UTI (lower urinary tract infection) 04/12/2012  . Tobacco abuse 04/12/2012  . SBO (small bowel obstruction) 04/12/2012  . HTN (hypertension) 04/12/2012  . Benign breast cysts in female 04/12/2012  . Anxiety 04/12/2012  . Lung nodules, ?incidental 04/12/2012  . Chronic constipation   . COPD (chronic obstructive pulmonary disease)     Consultants Internal medicine  Procedures Exploratory laparotomy with singleband enteral lysis  Hospital Course:  78 yr old female who presented to [facility] with abdominal pain, nausea and vomiting.  Work up showed a SBO.  She was admitted, an NGT was placed, IVFs were started and she was put on bowel rest.  After 11 days of conservative management the patient had not progressed therefore she was taken to the operating room and underwent the procedure listed above.  She tolerated this well.  Her post-operative course was complicated by a prolonged ileus.  TNA was started on the patient prior to surgery and this remained after surgery until her ileus resolved.  ONce her bowel function restarted her diet was advanced and she was able to tolerate this.  On POD#17, her vitals were stable, tolerating regular diet, pain well controlled, walking well in halls, sutures were removed and the patient was felt stable for discharge to home with her family to help with supervision.  The patient's skin edges had not healed well therefore the staples were left in place and she will follow up next week for recheck of this.  She will follow up in 2-3 weeks with Dr. Daphine Deutscher.         Medication List     As of 05/10/2012  9:59 AM    TAKE these medications         aspirin 325 MG tablet   Take 650 mg by mouth daily. Pain      HYDROcodone-acetaminophen 5-325 MG per tablet   Commonly known as: NORCO/VICODIN   Take 1-2 tablets by mouth every 6 (six) hours as needed.      polyethylene glycol packet   Commonly known as: MIRALAX / GLYCOLAX   Take 17 g by mouth every 12 (twelve) hours as needed (use as 2nd line if suppository doesn't work).         Follow-up Information    Follow up with MARTIN,MATTHEW B, MD. In 2 weeks. (Our office will contact you with your appointment day and time)    Contact information:   [address] [place] [address] [phone]       Follow up with [facility] On 05/17/2012. (Our office will contact you with your appointment time for 11/26.)    Contact information:   [address] [place] [address] [phone]         Signed: Denny Levy [facility] [phone]  05/10/2012, 9:59 AM

## 2012-05-12 ENCOUNTER — Telehealth (INDEPENDENT_AMBULATORY_CARE_PROVIDER_SITE_OTHER): Payer: Self-pay | Admitting: General Surgery

## 2012-05-12 NOTE — Telephone Encounter (Signed)
LMOM to schd patient's follow up appt for exp lap to the [facility] clinic 11/26 @3 :45 per Marisue Ivan..per orders from [facility] as well patient has an appt to come in and see Dr.Martin on 06/03/12@10 :50...if patient calls please make her aware.Marland Kitchen

## 2012-05-17 ENCOUNTER — Encounter (INDEPENDENT_AMBULATORY_CARE_PROVIDER_SITE_OTHER): Payer: Medicare Other

## 2012-05-18 ENCOUNTER — Telehealth (INDEPENDENT_AMBULATORY_CARE_PROVIDER_SITE_OTHER): Payer: Self-pay | Admitting: General Surgery

## 2012-05-18 NOTE — Telephone Encounter (Signed)
Left message to call Nursing office to schedule "Nurse Only" for staple removal.

## 2012-05-26 ENCOUNTER — Ambulatory Visit (INDEPENDENT_AMBULATORY_CARE_PROVIDER_SITE_OTHER): Payer: Medicare Other

## 2012-05-26 DIAGNOSIS — K929 Disease of digestive system, unspecified: Secondary | ICD-10-CM

## 2012-05-26 NOTE — Progress Notes (Signed)
Pt is here today for staple removal.  Staples had been in since surgery on 04/23/12.  The wound looked fine with minimal irritation from the staples.  Steri strips were placed on the incision.  Pt was instructed to keep her appointment with Dr. Daphine Deutscher on 06/03/12.

## 2012-06-03 ENCOUNTER — Encounter (INDEPENDENT_AMBULATORY_CARE_PROVIDER_SITE_OTHER): Payer: Medicare Other | Admitting: Surgery

## 2012-06-10 ENCOUNTER — Ambulatory Visit (INDEPENDENT_AMBULATORY_CARE_PROVIDER_SITE_OTHER): Payer: Medicare Other | Admitting: Surgery

## 2012-06-10 ENCOUNTER — Encounter (INDEPENDENT_AMBULATORY_CARE_PROVIDER_SITE_OTHER): Payer: Self-pay | Admitting: Surgery

## 2012-06-10 VITALS — BP 118/78 | HR 80 | Temp 97.8°F | Resp 16 | Ht 60.0 in | Wt 82.2 lb

## 2012-06-10 DIAGNOSIS — Z9889 Other specified postprocedural states: Secondary | ICD-10-CM

## 2012-06-10 NOTE — Patient Instructions (Signed)
Eat , eat and eat.  Gain weight.

## 2012-06-10 NOTE — Progress Notes (Signed)
Amber Rowland 78 y.o.  Body mass index is 16.05 kg/(m^2).  Patient Active Problem List  Diagnosis  . UTI (lower urinary tract infection)  . Chronic constipation  . COPD (chronic obstructive pulmonary disease)  . Tobacco abuse  . SBO (small bowel obstruction)  . HTN (hypertension)  . Benign breast cysts in female  . Anxiety  . Lung nodules, ?incidental  . Hyponatremia  . Malnutrition  . Ileus following gastrointestinal surgery    No Known Allergies  Past Surgical History  Procedure Date  . Bilateral oophorectomy 2009    PROCEDURE:  diagnostic laparoscopy, laparotomy with bilateral salpingo-  . Laparotomy 04/23/2012    Procedure: EXPLORATORY LAPAROTOMY;  Surgeon: Valarie Merino, MD;  Location: [facility];  Service: General;  Laterality: N/A;  enterolysis    No primary provider on file. No diagnosis found.  Incision has palpable suture in the middle of it.  She has no fat.  Advised to gain at least 10 lbs in the next 2 months.  Has small upper abdominal fat hernia that is occasionally tender.  Return 2 months Matt B. Daphine Deutscher, MD, [facility] [phone] beeper [phone]  06/10/2012 9:28 AM

## 2012-12-23 ENCOUNTER — Emergency Department (HOSPITAL_COMMUNITY)
Admission: EM | Admit: 2012-12-23 | Discharge: 2012-12-23 | Disposition: A | Discharge status: Home / Self Care | Payer: Medicare Other | Attending: Emergency Medicine | Admitting: Emergency Medicine

## 2012-12-23 ENCOUNTER — Encounter (HOSPITAL_COMMUNITY): Payer: Self-pay | Admitting: Emergency Medicine

## 2012-12-23 DIAGNOSIS — Z862 Personal history of diseases of the blood and blood-forming organs and certain disorders involving the immune mechanism: Secondary | ICD-10-CM | POA: Diagnosis not present

## 2012-12-23 DIAGNOSIS — Z8719 Personal history of other diseases of the digestive system: Secondary | ICD-10-CM | POA: Diagnosis not present

## 2012-12-23 DIAGNOSIS — Z87448 Personal history of other diseases of urinary system: Secondary | ICD-10-CM | POA: Insufficient documentation

## 2012-12-23 DIAGNOSIS — N390 Urinary tract infection, site not specified: Secondary | ICD-10-CM | POA: Diagnosis not present

## 2012-12-23 DIAGNOSIS — J449 Chronic obstructive pulmonary disease, unspecified: Secondary | ICD-10-CM | POA: Insufficient documentation

## 2012-12-23 DIAGNOSIS — J4489 Other specified chronic obstructive pulmonary disease: Secondary | ICD-10-CM | POA: Insufficient documentation

## 2012-12-23 DIAGNOSIS — Z8669 Personal history of other diseases of the nervous system and sense organs: Secondary | ICD-10-CM | POA: Diagnosis not present

## 2012-12-23 DIAGNOSIS — R300 Dysuria: Secondary | ICD-10-CM | POA: Diagnosis not present

## 2012-12-23 DIAGNOSIS — Z8639 Personal history of other endocrine, nutritional and metabolic disease: Secondary | ICD-10-CM | POA: Insufficient documentation

## 2012-12-23 DIAGNOSIS — Z79899 Other long term (current) drug therapy: Secondary | ICD-10-CM | POA: Diagnosis not present

## 2012-12-23 DIAGNOSIS — Z7982 Long term (current) use of aspirin: Secondary | ICD-10-CM | POA: Diagnosis not present

## 2012-12-23 DIAGNOSIS — F17200 Nicotine dependence, unspecified, uncomplicated: Secondary | ICD-10-CM | POA: Insufficient documentation

## 2012-12-23 DIAGNOSIS — Z8742 Personal history of other diseases of the female genital tract: Secondary | ICD-10-CM | POA: Diagnosis not present

## 2012-12-23 DIAGNOSIS — I10 Essential (primary) hypertension: Secondary | ICD-10-CM | POA: Insufficient documentation

## 2012-12-23 LAB — URINALYSIS, ROUTINE W REFLEX MICROSCOPIC
Bilirubin Urine: NEGATIVE
Glucose, UA: NEGATIVE mg/dL
Ketones, ur: NEGATIVE mg/dL
pH: 7 (ref 5.0–8.0)

## 2012-12-23 LAB — CBC WITH DIFFERENTIAL/PLATELET
Basophils Absolute: 0 10*3/uL (ref 0.0–0.1)
Basophils Relative: 1 % (ref 0–1)
HCT: 40.9 % (ref 36.0–46.0)
Hemoglobin: 13.2 g/dL (ref 12.0–15.0)
Lymphocytes Relative: 22 % (ref 12–46)
MCHC: 32.3 g/dL (ref 30.0–36.0)
Monocytes Absolute: 0.6 10*3/uL (ref 0.1–1.0)
Monocytes Relative: 9 % (ref 3–12)
Neutro Abs: 4.6 10*3/uL (ref 1.7–7.7)
Neutrophils Relative %: 65 % (ref 43–77)
RBC: 4.42 MIL/uL (ref 3.87–5.11)
WBC: 7 10*3/uL (ref 4.0–10.5)

## 2012-12-23 LAB — COMPREHENSIVE METABOLIC PANEL
AST: 31 U/L (ref 0–37)
Albumin: 3.6 g/dL (ref 3.5–5.2)
Alkaline Phosphatase: 111 U/L (ref 39–117)
BUN: 9 mg/dL (ref 6–23)
CO2: 34 mEq/L — ABNORMAL HIGH (ref 19–32)
Chloride: 96 mEq/L (ref 96–112)
Creatinine, Ser: 0.92 mg/dL (ref 0.50–1.10)
GFR calc non Af Amer: 59 mL/min — ABNORMAL LOW (ref >90)
Potassium: 3.9 mEq/L (ref 3.5–5.1)
Total Bilirubin: 0.3 mg/dL (ref 0.3–1.2)

## 2012-12-23 LAB — URINE MICROSCOPIC-ADD ON

## 2012-12-23 MED ORDER — DEXTROSE 5 % IV SOLN
1.0000 g | Freq: Once | INTRAVENOUS | Status: AC
  Administered 2012-12-23: 1 g via INTRAVENOUS
  Filled 2012-12-23: qty 10

## 2012-12-23 MED ORDER — ONDANSETRON HCL 4 MG/2ML IJ SOLN
4.0000 mg | Freq: Once | INTRAMUSCULAR | Status: AC
  Administered 2012-12-23: 4 mg via INTRAVENOUS
  Filled 2012-12-23: qty 2

## 2012-12-23 MED ORDER — SODIUM CHLORIDE 0.9 % IV BOLUS (SEPSIS)
500.0000 mL | Freq: Once | INTRAVENOUS | Status: AC
  Administered 2012-12-23: 19:00:00 via INTRAVENOUS

## 2012-12-23 MED ORDER — MORPHINE SULFATE 4 MG/ML IJ SOLN
4.0000 mg | Freq: Once | INTRAMUSCULAR | Status: DC
  Filled 2012-12-23: qty 1

## 2012-12-23 MED ORDER — CEPHALEXIN 500 MG PO CAPS: 500.0000 mg | ORAL_CAPSULE | Freq: Four times a day (QID) | ORAL | Status: DC

## 2012-12-23 NOTE — ED Notes (Signed)
Pt c/o of pain in lower abd and radiates to back. Hx of UTI.

## 2012-12-23 NOTE — ED Provider Notes (Signed)
History    CSN: [id number] Arrival date & time 12/23/12  1705  First MD Initiated Contact with Patient 12/23/12 1737     Chief Complaint  Patient presents with  . Abdominal Pain   (Consider location/radiation/quality/duration/timing/severity/associated sxs/prior Treatment) HPI Patient presents with complaint of increased frequency of urination as well as burning with urination. She also has some lower abdominal discomfort. She also complains of some pain in her right lower back which is only worse with movement and palpation. She denies any fever she denies vomiting. She has had no change in bowel movements. She has no difficulty urinating and no blood in her urine. She has been eating and drinking normally. She has a history of prior urinary tract infections with similar symptoms.  No treatment prior to arrival.  There are no other associated systemic symptoms, there are no other alleviating or modifying factors.  Past Medical History  Diagnosis Date  . Glaucoma (increased eye pressure)   . Ovarian cyst, right 2009    s/p BSO 2009: OVARIAN FIBROTHECOMA in multiple fluid filled cysts  4.5cm region  . Atrophy of left kidney     Chronic atrophy of the left kidney.  . Chronic constipation   . COPD (chronic obstructive pulmonary disease)     Hyperinflation and heavy smoking history strongly  . HTN (hypertension) 04/12/2012  . Benign breast cysts in female 04/12/2012    Seen on mammography 2009   . UTI (lower urinary tract infection)     History of chronic recurrent UTIs  . Malnutrition 04/28/2012  . Ileus following gastrointestinal surgery 04/28/2012  . SBO (small bowel obstruction)    Past Surgical History  Procedure Laterality Date  . Bilateral oophorectomy  2009    PROCEDURE:  diagnostic laparoscopy, laparotomy with bilateral salpingo-  . Laparotomy  04/23/2012    Procedure: EXPLORATORY LAPAROTOMY;  Surgeon: Valarie Merino, MD;  Location: [facility];  Service: General;  Laterality:  N/A;  enterolysis    Family History  Problem Relation Age of Onset  . Diabetes Son   . High blood pressure Son   . Coronary artery disease Son    History  Substance Use Topics  . Smoking status: Current Every Day Smoker -- 0.80 packs/day for 60 years    Types: Cigarettes  . Smokeless tobacco: Never Used  . Alcohol Use: No   OB History   Grav Para Term Preterm Abortions TAB SAB Ect Mult Living                 Review of Systems ROS reviewed and all otherwise negative except for mentioned in HPI  Allergies  Review of patient's allergies indicates no known allergies.  Home Medications   Current Outpatient Rx  Name  Route  Sig  Dispense  Refill  . aspirin 325 MG tablet   Oral   Take 325 mg by mouth daily.          Marland Kitchen docusate sodium (COLACE) 100 MG capsule   Oral   Take 100 mg by mouth 2 (two) times daily as needed for constipation.         . Multiple Vitamin (MULTIVITAMIN WITH MINERALS) TABS   Oral   Take 1 tablet by mouth daily.         Marland Kitchen acetaminophen (TYLENOL) 500 MG tablet   Oral   Take 1,000 mg by mouth every 6 (six) hours as needed for pain.          . cephALEXin (KEFLEX)  500 MG capsule   Oral   Take 1 capsule (500 mg total) by mouth 4 (four) times daily.   40 capsule   0    BP 157/73  Pulse 68  Temp(Src) 98.6 F (37 C) (Oral)  Resp 17  SpO2 94% Vitals reviewed Physical Exam Physical Examination: General appearance - alert, thin appearing, and in no distress Mental status - alert, oriented to person, place, and time Eyes - no conjunctival injection, no scleral icterus Mouth - mucous membranes moist, pharynx normal without lesions Chest - clear to auscultation, no wheezes, rales or rhonchi, symmetric air entry Heart - normal rate, regular rhythm, normal S1, S2, no murmurs, rubs, clicks or gallops Abdomen - soft, nontender, nondistended, no masses or organomegaly, protuberant abdomen which she states is her baseline, small easily reducible  umblicial hernia, nabs Extremities - peripheral pulses normal, no pedal edema, no clubbing or cyanosis Skin - normal coloration and turgor, no rashes  ED Course  Procedures (including critical care time) Labs Reviewed  COMPREHENSIVE METABOLIC PANEL - Abnormal; Notable for the following:    CO2 34 (*)    GFR calc non Af Amer 59 (*)    GFR calc Af Amer 68 (*)    All other components within normal limits  URINALYSIS, ROUTINE W REFLEX MICROSCOPIC - Abnormal; Notable for the following:    APPearance CLOUDY (*)    Nitrite POSITIVE (*)    Leukocytes, UA MODERATE (*)    All other components within normal limits  URINE MICROSCOPIC-ADD ON - Abnormal; Notable for the following:    Bacteria, UA MANY (*)    All other components within normal limits  URINE CULTURE  CBC WITH DIFFERENTIAL  LIPASE, BLOOD   No results found. 1. Urinary tract infection     MDM  Pt presenting with lower abdominal discomfort and dysuria.  ua c/w UTI.  Pt is otherwise well hydrated and nontoxic appearing.  Her abdominal exam is benign- small easily reducible inguinal hernia.  Low suspicion for SBO or other acute abdominal emergent process given benign exam and finding of UTI that does explain her symptoms.  Pt started on rocephin.  D/c home with keflex.  All results d/w her at the bedside. Discharged with strict return precautions.  Pt agreeable with plan.  Ethelda Chick, MD 12/23/12 2114

## 2012-12-25 LAB — URINE CULTURE

## 2012-12-26 NOTE — ED Notes (Signed)
+   Urine Patient treated with Cephalexin-sensitive to same-chart appended per protocol MD. 

## 2013-09-05 ENCOUNTER — Encounter (HOSPITAL_COMMUNITY): Payer: Self-pay | Admitting: Emergency Medicine

## 2013-09-05 ENCOUNTER — Emergency Department (HOSPITAL_COMMUNITY)
Admission: EM | Admit: 2013-09-05 | Discharge: 2013-09-05 | Disposition: A | Discharge status: Home / Self Care | Payer: Medicare Other | Attending: Emergency Medicine | Admitting: Emergency Medicine

## 2013-09-05 ENCOUNTER — Emergency Department (HOSPITAL_COMMUNITY): Payer: Medicare Other

## 2013-09-05 DIAGNOSIS — Z8639 Personal history of other endocrine, nutritional and metabolic disease: Secondary | ICD-10-CM | POA: Diagnosis not present

## 2013-09-05 DIAGNOSIS — I10 Essential (primary) hypertension: Secondary | ICD-10-CM | POA: Diagnosis not present

## 2013-09-05 DIAGNOSIS — F17200 Nicotine dependence, unspecified, uncomplicated: Secondary | ICD-10-CM | POA: Insufficient documentation

## 2013-09-05 DIAGNOSIS — M25529 Pain in unspecified elbow: Secondary | ICD-10-CM | POA: Diagnosis not present

## 2013-09-05 DIAGNOSIS — H409 Unspecified glaucoma: Secondary | ICD-10-CM | POA: Insufficient documentation

## 2013-09-05 DIAGNOSIS — Z87448 Personal history of other diseases of urinary system: Secondary | ICD-10-CM | POA: Insufficient documentation

## 2013-09-05 DIAGNOSIS — Z8742 Personal history of other diseases of the female genital tract: Secondary | ICD-10-CM | POA: Insufficient documentation

## 2013-09-05 DIAGNOSIS — Z79899 Other long term (current) drug therapy: Secondary | ICD-10-CM | POA: Insufficient documentation

## 2013-09-05 DIAGNOSIS — Z87440 Personal history of urinary (tract) infections: Secondary | ICD-10-CM | POA: Insufficient documentation

## 2013-09-05 DIAGNOSIS — Z8719 Personal history of other diseases of the digestive system: Secondary | ICD-10-CM | POA: Insufficient documentation

## 2013-09-05 DIAGNOSIS — K5900 Constipation, unspecified: Secondary | ICD-10-CM | POA: Insufficient documentation

## 2013-09-05 DIAGNOSIS — S5010XA Contusion of unspecified forearm, initial encounter: Secondary | ICD-10-CM | POA: Insufficient documentation

## 2013-09-05 DIAGNOSIS — Z862 Personal history of diseases of the blood and blood-forming organs and certain disorders involving the immune mechanism: Secondary | ICD-10-CM | POA: Insufficient documentation

## 2013-09-05 DIAGNOSIS — S6990XA Unspecified injury of unspecified wrist, hand and finger(s), initial encounter: Secondary | ICD-10-CM | POA: Diagnosis not present

## 2013-09-05 DIAGNOSIS — S5000XA Contusion of unspecified elbow, initial encounter: Secondary | ICD-10-CM | POA: Diagnosis not present

## 2013-09-05 DIAGNOSIS — Z7982 Long term (current) use of aspirin: Secondary | ICD-10-CM | POA: Diagnosis not present

## 2013-09-05 DIAGNOSIS — S59909A Unspecified injury of unspecified elbow, initial encounter: Secondary | ICD-10-CM | POA: Diagnosis not present

## 2013-09-05 DIAGNOSIS — Y92009 Unspecified place in unspecified non-institutional (private) residence as the place of occurrence of the external cause: Secondary | ICD-10-CM | POA: Insufficient documentation

## 2013-09-05 DIAGNOSIS — Y9389 Activity, other specified: Secondary | ICD-10-CM | POA: Insufficient documentation

## 2013-09-05 DIAGNOSIS — J4489 Other specified chronic obstructive pulmonary disease: Secondary | ICD-10-CM | POA: Insufficient documentation

## 2013-09-05 DIAGNOSIS — M7989 Other specified soft tissue disorders: Secondary | ICD-10-CM | POA: Diagnosis not present

## 2013-09-05 DIAGNOSIS — J449 Chronic obstructive pulmonary disease, unspecified: Secondary | ICD-10-CM | POA: Insufficient documentation

## 2013-09-05 DIAGNOSIS — IMO0002 Reserved for concepts with insufficient information to code with codable children: Secondary | ICD-10-CM | POA: Insufficient documentation

## 2013-09-05 DIAGNOSIS — T148XXA Other injury of unspecified body region, initial encounter: Secondary | ICD-10-CM

## 2013-09-05 NOTE — ED Notes (Signed)
Patient reports hitting her left arm on a table today and developed a hematoma. Patient was hypertensive in triage.

## 2013-09-05 NOTE — Progress Notes (Signed)
   CARE MANAGEMENT ED NOTE 09/05/2013  [facility]  [facility] A   Account Number:  [id number]  Date Initiated:  09/05/2013  Documentation initiated by:  Radford PaxFERRERO,Brennen Camper  Subjective/Objective Assessment:   Patient presents to Ed with hitting her left arm on a table and developed a hematoma.     Subjective/Objective Assessment Detail:     Action/Plan:   Action/Plan Detail:   Anticipated DC Date:  09/05/2013     Status Recommendation to Physician:   Result of Recommendation:    Other ED Services  Consult Working Plan    DC Planning Services  Other  PCP issues    Choice offered to / List presented to:            Status of service:  Completed, signed off  ED Comments:   ED Comments Detail:  EDCM spoke to patient at bedside.  Patient reports she does not have a pcp.  Patient reports, "I haven't been to a doctor since my surgery."  Patient reports he son stays with her.  Patient does not have any medical equioment at home and is able to perform her ADL's on her own without difficulty.  [facility] with a list of physicians who accept [employer] insurance within one mile of patient's zip code.  Also provided patient with [place] and a list of private duty nursing agencies.  Explained to patient that with home health she may receive a visiting RN, PT, OT, aide and social worker if needed.  Patient thankful for resources but reports she will not be needing home health services at this time.  No further EDCM needs at this time.

## 2013-09-05 NOTE — ED Provider Notes (Signed)
CSN: [id number]     Arrival date & time 09/05/13  1456 History   First MD Initiated Contact with Patient 09/05/13 1526     Chief Complaint  Patient presents with  . hematoma    . Hypertension     (Consider location/radiation/quality/duration/timing/severity/associated sxs/prior Treatment) HPI: [name] is a 80 year old woman with a past medical history of glaucoma, HTN, and COPD who presents to the Emergency Department today with a chief complaint of swelling in her left elbow.  She reports that because of her eye problems she does not see well, which led her to bump her left elbow against a table in her house earlier today.  She later noticed while doing laundry that the area had swollen up, and her son suggested that she might have "burst a blood vessel" so she decided to come the the ED.  She states that it is tender to palpation.  She denies history of easy bruising or bleeding.  She denies decreased range of motion in the elbow and changes to sensation distal to the elbow.   Past Medical History  Diagnosis Date  . Glaucoma (increased eye pressure)   . Ovarian cyst, right 2009    s/p BSO 2009: OVARIAN FIBROTHECOMA in multiple fluid filled cysts  4.5cm region  . Atrophy of left kidney     Chronic atrophy of the left kidney.  . Chronic constipation   . COPD (chronic obstructive pulmonary disease)     Hyperinflation and heavy smoking history strongly  . HTN (hypertension) 04/12/2012  . Benign breast cysts in female 04/12/2012    Seen on mammography 2009   . UTI (lower urinary tract infection)     History of chronic recurrent UTIs  . Malnutrition 04/28/2012  . Ileus following gastrointestinal surgery 04/28/2012  . SBO (small bowel obstruction)    Past Surgical History  Procedure Laterality Date  . Bilateral oophorectomy  2009    PROCEDURE:  diagnostic laparoscopy, laparotomy with bilateral salpingo-  . Laparotomy  04/23/2012    Procedure: EXPLORATORY LAPAROTOMY;  Surgeon: Valarie MerinoMatthew  B Martin, MD;  Location: [facility];  Service: General;  Laterality: N/A;  enterolysis    Family History  Problem Relation Age of Onset  . Diabetes Son   . High blood pressure Son   . Coronary artery disease Son    History  Substance Use Topics  . Smoking status: Current Every Day Smoker -- 0.80 packs/day for 60 years    Types: Cigarettes  . Smokeless tobacco: Never Used  . Alcohol Use: No   OB History   Grav Para Term Preterm Abortions TAB SAB Ect Mult Living                 Review of Systems    Allergies  Review of patient's allergies indicates no known allergies.  Home Medications   Current Outpatient Rx  Name  Route  Sig  Dispense  Refill  . aspirin 325 MG tablet   Oral   Take 325 mg by mouth daily.          . magnesium hydroxide (MILK OF MAGNESIA) 400 MG/5ML suspension   Oral   Take 15 mLs by mouth daily as needed for mild constipation.         . Multiple Vitamin (MULTIVITAMIN WITH MINERALS) TABS   Oral   Take 1 tablet by mouth daily.         . Travoprost, BAK Free, (TRAVATAN) 0.004 % SOLN ophthalmic solution  Both Eyes   Place 1 drop into both eyes at bedtime. As directed          BP 179/99  Pulse 69  Temp(Src) 98 F (36.7 C) (Oral)  Resp 16  SpO2 96% Physical Exam  Nursing note and vitals reviewed. Constitutional: She appears well-developed and well-nourished. No distress.  Patient is cachectic  HENT:  Head: Normocephalic and atraumatic.  Mouth/Throat: Oropharynx is clear and moist.  Cardiovascular: Normal rate, regular rhythm and normal heart sounds.  Exam reveals no gallop and no friction rub.   No murmur heard. Pulmonary/Chest: Effort normal and breath sounds normal.  Musculoskeletal:  Round, defined mass over lateral aspect of left elbow with some dark discoloration.  The mass is tender to palpation.  Bilateral elbows and wrists with normal active ROM.  Bilateral radial pulses 3+.  Bilateral elbow flexion and extension, finger  abduction and adduction strength 4+.  Neurological:  Soft touch sensation intact in fingers bilaterally.  Skin:  Some small ecchymoses noted to bilateral forearms; skin thin and dry    ED Course  Procedures (including critical care time) Labs Review Labs Reviewed - No data to display Imaging Review Dg Elbow Complete Left  09/05/2013   CLINICAL DATA:  80 year old female with left elbow injury and pain.  EXAM: LEFT ELBOW - COMPLETE 3+ VIEW  COMPARISON:  02/03/2006  FINDINGS: There is no evidence of acute fracture, subluxation or dislocation.  There is no evidence of joint effusion.  Radial soft tissue swelling is noted.  Mild diffuse osteopenia is present.  No focal bony lesions are identified.  IMPRESSION: Soft tissue swelling without acute bony abnormality.   Electronically Signed   By: Laveda Abbe M.D.   On: 09/05/2013 16:41   The patient will be treated for hematoma of the L elbow. Told to return here as needed. Ice and heat to the area.   Carlyle Dolly, PA-C 09/06/13 [phone]

## 2013-09-05 NOTE — Discharge Instructions (Signed)
Return here as needed. Follow up with the clinic provided. Ice and heat to the area.

## 2013-09-06 NOTE — ED Provider Notes (Signed)
Medical screening examination/treatment/procedure(s) were performed by non-physician practitioner and as supervising physician I was immediately available for consultation/collaboration.   EKG Interpretation None       Martha K Linker, MD 09/06/13 1500 

## 2013-09-16 ENCOUNTER — Emergency Department (HOSPITAL_COMMUNITY): Payer: Medicare Other

## 2013-09-16 ENCOUNTER — Encounter (HOSPITAL_COMMUNITY): Payer: Self-pay | Admitting: Emergency Medicine

## 2013-09-16 ENCOUNTER — Observation Stay (HOSPITAL_COMMUNITY)
Admission: EM | Admit: 2013-09-16 | Discharge: 2013-09-18 | Disposition: A | Discharge status: Home / Self Care | Payer: Medicare Other | Attending: Internal Medicine | Admitting: Internal Medicine

## 2013-09-16 DIAGNOSIS — E41 Nutritional marasmus: Secondary | ICD-10-CM | POA: Diagnosis not present

## 2013-09-16 DIAGNOSIS — E43 Unspecified severe protein-calorie malnutrition: Secondary | ICD-10-CM

## 2013-09-16 DIAGNOSIS — Y92009 Unspecified place in unspecified non-institutional (private) residence as the place of occurrence of the external cause: Secondary | ICD-10-CM | POA: Insufficient documentation

## 2013-09-16 DIAGNOSIS — R011 Cardiac murmur, unspecified: Secondary | ICD-10-CM

## 2013-09-16 DIAGNOSIS — J4489 Other specified chronic obstructive pulmonary disease: Secondary | ICD-10-CM | POA: Insufficient documentation

## 2013-09-16 DIAGNOSIS — R0789 Other chest pain: Secondary | ICD-10-CM | POA: Diagnosis not present

## 2013-09-16 DIAGNOSIS — I10 Essential (primary) hypertension: Secondary | ICD-10-CM | POA: Diagnosis not present

## 2013-09-16 DIAGNOSIS — R079 Chest pain, unspecified: Secondary | ICD-10-CM | POA: Diagnosis not present

## 2013-09-16 DIAGNOSIS — H409 Unspecified glaucoma: Secondary | ICD-10-CM

## 2013-09-16 DIAGNOSIS — J449 Chronic obstructive pulmonary disease, unspecified: Secondary | ICD-10-CM | POA: Diagnosis not present

## 2013-09-16 DIAGNOSIS — K9189 Other postprocedural complications and disorders of digestive system: Secondary | ICD-10-CM

## 2013-09-16 DIAGNOSIS — Z720 Tobacco use: Secondary | ICD-10-CM

## 2013-09-16 DIAGNOSIS — Z8249 Family history of ischemic heart disease and other diseases of the circulatory system: Secondary | ICD-10-CM | POA: Insufficient documentation

## 2013-09-16 DIAGNOSIS — F411 Generalized anxiety disorder: Secondary | ICD-10-CM | POA: Insufficient documentation

## 2013-09-16 DIAGNOSIS — K219 Gastro-esophageal reflux disease without esophagitis: Secondary | ICD-10-CM | POA: Insufficient documentation

## 2013-09-16 DIAGNOSIS — F419 Anxiety disorder, unspecified: Secondary | ICD-10-CM | POA: Diagnosis present

## 2013-09-16 DIAGNOSIS — K5909 Other constipation: Secondary | ICD-10-CM

## 2013-09-16 DIAGNOSIS — N6009 Solitary cyst of unspecified breast: Secondary | ICD-10-CM

## 2013-09-16 DIAGNOSIS — N390 Urinary tract infection, site not specified: Secondary | ICD-10-CM

## 2013-09-16 DIAGNOSIS — K5900 Constipation, unspecified: Secondary | ICD-10-CM | POA: Insufficient documentation

## 2013-09-16 DIAGNOSIS — E46 Unspecified protein-calorie malnutrition: Secondary | ICD-10-CM

## 2013-09-16 DIAGNOSIS — E871 Hypo-osmolality and hyponatremia: Secondary | ICD-10-CM

## 2013-09-16 DIAGNOSIS — D649 Anemia, unspecified: Secondary | ICD-10-CM | POA: Insufficient documentation

## 2013-09-16 DIAGNOSIS — S5010XA Contusion of unspecified forearm, initial encounter: Secondary | ICD-10-CM | POA: Insufficient documentation

## 2013-09-16 DIAGNOSIS — I517 Cardiomegaly: Secondary | ICD-10-CM | POA: Insufficient documentation

## 2013-09-16 DIAGNOSIS — Z9889 Other specified postprocedural states: Secondary | ICD-10-CM

## 2013-09-16 DIAGNOSIS — Z9079 Acquired absence of other genital organ(s): Secondary | ICD-10-CM | POA: Insufficient documentation

## 2013-09-16 DIAGNOSIS — G4700 Insomnia, unspecified: Secondary | ICD-10-CM | POA: Insufficient documentation

## 2013-09-16 DIAGNOSIS — M79602 Pain in left arm: Secondary | ICD-10-CM

## 2013-09-16 DIAGNOSIS — I079 Rheumatic tricuspid valve disease, unspecified: Secondary | ICD-10-CM | POA: Insufficient documentation

## 2013-09-16 DIAGNOSIS — K56609 Unspecified intestinal obstruction, unspecified as to partial versus complete obstruction: Secondary | ICD-10-CM

## 2013-09-16 DIAGNOSIS — K567 Ileus, unspecified: Secondary | ICD-10-CM

## 2013-09-16 DIAGNOSIS — R918 Other nonspecific abnormal finding of lung field: Secondary | ICD-10-CM

## 2013-09-16 DIAGNOSIS — A498 Other bacterial infections of unspecified site: Secondary | ICD-10-CM | POA: Insufficient documentation

## 2013-09-16 DIAGNOSIS — F17200 Nicotine dependence, unspecified, uncomplicated: Secondary | ICD-10-CM | POA: Insufficient documentation

## 2013-09-16 DIAGNOSIS — Z79899 Other long term (current) drug therapy: Secondary | ICD-10-CM | POA: Insufficient documentation

## 2013-09-16 DIAGNOSIS — IMO0002 Reserved for concepts with insufficient information to code with codable children: Secondary | ICD-10-CM | POA: Insufficient documentation

## 2013-09-16 DIAGNOSIS — M79609 Pain in unspecified limb: Secondary | ICD-10-CM | POA: Insufficient documentation

## 2013-09-16 HISTORY — DX: Unspecified severe protein-calorie malnutrition: E43

## 2013-09-16 LAB — HEPATIC FUNCTION PANEL
ALK PHOS: 132 U/L — AB (ref 39–117)
ALT: 14 U/L (ref 0–35)
AST: 21 U/L (ref 0–37)
Albumin: 3.2 g/dL — ABNORMAL LOW (ref 3.5–5.2)
BILIRUBIN TOTAL: 0.3 mg/dL (ref 0.3–1.2)
Total Protein: 6.4 g/dL (ref 6.0–8.3)

## 2013-09-16 LAB — PROTIME-INR
INR: 1 (ref 0.00–1.49)
Prothrombin Time: 13 seconds (ref 11.6–15.2)

## 2013-09-16 LAB — BASIC METABOLIC PANEL
BUN: 12 mg/dL (ref 6–23)
CALCIUM: 9.9 mg/dL (ref 8.4–10.5)
CHLORIDE: 95 meq/L — AB (ref 96–112)
CO2: 32 mEq/L (ref 19–32)
CREATININE: 0.88 mg/dL (ref 0.50–1.10)
GFR calc non Af Amer: 61 mL/min — ABNORMAL LOW (ref >90)
GFR, EST AFRICAN AMERICAN: 71 mL/min — AB (ref >90)
Glucose, Bld: 100 mg/dL — ABNORMAL HIGH (ref 70–99)
Potassium: 4.1 mEq/L (ref 3.7–5.3)
SODIUM: 137 meq/L (ref 137–147)

## 2013-09-16 LAB — CBC
HCT: 39.2 % (ref 36.0–46.0)
Hemoglobin: 12.6 g/dL (ref 12.0–15.0)
MCH: 30.4 pg (ref 26.0–34.0)
MCHC: 32.1 g/dL (ref 30.0–36.0)
MCV: 94.7 fL (ref 78.0–100.0)
PLATELETS: 172 10*3/uL (ref 150–400)
RBC: 4.14 MIL/uL (ref 3.87–5.11)
RDW: 13.3 % (ref 11.5–15.5)
WBC: 7.4 10*3/uL (ref 4.0–10.5)

## 2013-09-16 LAB — I-STAT TROPONIN, ED: TROPONIN I, POC: 0 ng/mL (ref 0.00–0.08)

## 2013-09-16 LAB — URINALYSIS, ROUTINE W REFLEX MICROSCOPIC
BILIRUBIN URINE: NEGATIVE
Glucose, UA: NEGATIVE mg/dL
Hgb urine dipstick: NEGATIVE
Ketones, ur: NEGATIVE mg/dL
NITRITE: POSITIVE — AB
PROTEIN: NEGATIVE mg/dL
Specific Gravity, Urine: 1.005 (ref 1.005–1.030)
Urobilinogen, UA: 0.2 mg/dL (ref 0.0–1.0)
pH: 7.5 (ref 5.0–8.0)

## 2013-09-16 LAB — URINE MICROSCOPIC-ADD ON

## 2013-09-16 LAB — MAGNESIUM: MAGNESIUM: 2.1 mg/dL (ref 1.5–2.5)

## 2013-09-16 LAB — TROPONIN I: Troponin I: <0.3 ng/mL (ref <0.30)

## 2013-09-16 MED ORDER — TIOTROPIUM BROMIDE MONOHYDRATE 18 MCG IN CAPS
18.0000 ug | ORAL_CAPSULE | Freq: Every day | RESPIRATORY_TRACT | Status: DC
  Administered 2013-09-17 – 2013-09-18 (×2): 18 ug via RESPIRATORY_TRACT
  Filled 2013-09-16: qty 5

## 2013-09-16 MED ORDER — HYDRALAZINE HCL 20 MG/ML IJ SOLN
5.0000 mg | Freq: Four times a day (QID) | INTRAMUSCULAR | Status: DC | PRN
  Filled 2013-09-16: qty 0.25

## 2013-09-16 MED ORDER — POLYETHYLENE GLYCOL 3350 17 G PO PACK
17.0000 g | PACK | Freq: Every day | ORAL | Status: DC | PRN
  Administered 2013-09-17: 17 g via ORAL
  Filled 2013-09-16: qty 1

## 2013-09-16 MED ORDER — NITROGLYCERIN 2 % TD OINT
1.0000 [in_us] | TOPICAL_OINTMENT | Freq: Once | TRANSDERMAL | Status: AC
  Administered 2013-09-16: 1 [in_us] via TOPICAL
  Filled 2013-09-16: qty 30

## 2013-09-16 MED ORDER — ASPIRIN 81 MG PO CHEW
324.0000 mg | CHEWABLE_TABLET | Freq: Once | ORAL | Status: AC
  Administered 2013-09-16: 324 mg via ORAL
  Filled 2013-09-16: qty 4

## 2013-09-16 MED ORDER — NICOTINE 14 MG/24HR TD PT24
14.0000 mg | MEDICATED_PATCH | Freq: Every day | TRANSDERMAL | Status: DC
  Administered 2013-09-17 – 2013-09-18 (×3): 14 mg via TRANSDERMAL
  Filled 2013-09-16 (×3): qty 1

## 2013-09-16 MED ORDER — SODIUM CHLORIDE 0.9 % IV SOLN
INTRAVENOUS | Status: AC
Start: 2013-09-16 — End: 2013-09-17
  Administered 2013-09-17 (×2): via INTRAVENOUS

## 2013-09-16 MED ORDER — SENNOSIDES-DOCUSATE SODIUM 8.6-50 MG PO TABS
1.0000 | ORAL_TABLET | Freq: Every evening | ORAL | Status: DC | PRN
  Administered 2013-09-17: 1 via ORAL
  Filled 2013-09-16 (×2): qty 1

## 2013-09-16 MED ORDER — PANTOPRAZOLE SODIUM 40 MG PO TBEC
40.0000 mg | DELAYED_RELEASE_TABLET | Freq: Every day | ORAL | Status: DC
  Administered 2013-09-17 – 2013-09-18 (×3): 40 mg via ORAL
  Filled 2013-09-16 (×3): qty 1

## 2013-09-16 MED ORDER — SODIUM CHLORIDE 0.9 % IV SOLN
1000.0000 mL | INTRAVENOUS | Status: DC
  Administered 2013-09-16: 1000 mL via INTRAVENOUS

## 2013-09-16 MED ORDER — LISINOPRIL 2.5 MG PO TABS
2.5000 mg | ORAL_TABLET | Freq: Every day | ORAL | Status: DC
  Administered 2013-09-17: 2.5 mg via ORAL
  Filled 2013-09-16 (×3): qty 1

## 2013-09-16 MED ORDER — MOMETASONE FURO-FORMOTEROL FUM 100-5 MCG/ACT IN AERO
2.0000 | INHALATION_SPRAY | Freq: Two times a day (BID) | RESPIRATORY_TRACT | Status: DC
  Administered 2013-09-16 – 2013-09-18 (×4): 2 via RESPIRATORY_TRACT
  Filled 2013-09-16: qty 8.8

## 2013-09-16 MED ORDER — ASPIRIN 325 MG PO TABS
325.0000 mg | ORAL_TABLET | Freq: Every morning | ORAL | Status: DC
  Administered 2013-09-17 – 2013-09-18 (×2): 325 mg via ORAL
  Filled 2013-09-16 (×2): qty 1

## 2013-09-16 MED ORDER — NITROGLYCERIN 0.4 MG SL SUBL: 0.4000 mg | SUBLINGUAL_TABLET | SUBLINGUAL | Status: DC | PRN

## 2013-09-16 MED ORDER — METOPROLOL TARTRATE 12.5 MG HALF TABLET
12.5000 mg | ORAL_TABLET | Freq: Two times a day (BID) | ORAL | Status: DC
  Administered 2013-09-16 – 2013-09-18 (×4): 12.5 mg via ORAL
  Filled 2013-09-16 (×5): qty 1

## 2013-09-16 MED ORDER — IPRATROPIUM-ALBUTEROL 0.5-2.5 (3) MG/3ML IN SOLN: 3.0000 mL | RESPIRATORY_TRACT | Status: DC | PRN

## 2013-09-16 MED ORDER — ALBUTEROL SULFATE (2.5 MG/3ML) 0.083% IN NEBU: 2.5000 mg | INHALATION_SOLUTION | RESPIRATORY_TRACT | Status: DC | PRN

## 2013-09-16 MED ORDER — ENSURE COMPLETE PO LIQD
237.0000 mL | Freq: Two times a day (BID) | ORAL | Status: DC
Start: 2013-09-17 — End: 2013-09-18
  Administered 2013-09-17 – 2013-09-18 (×3): 237 mL via ORAL

## 2013-09-16 MED ORDER — ENOXAPARIN SODIUM 40 MG/0.4ML ~~LOC~~ SOLN
40.0000 mg | Freq: Every day | SUBCUTANEOUS | Status: DC
  Administered 2013-09-17: 40 mg via SUBCUTANEOUS
  Filled 2013-09-16 (×2): qty 0.4

## 2013-09-16 MED ORDER — TIOTROPIUM BROMIDE MONOHYDRATE 18 MCG IN CAPS
18.0000 ug | ORAL_CAPSULE | Freq: Every day | RESPIRATORY_TRACT | Status: DC
  Filled 2013-09-16: qty 5

## 2013-09-16 MED ORDER — LATANOPROST 0.005 % OP SOLN
1.0000 [drp] | Freq: Every day | OPHTHALMIC | Status: DC
  Administered 2013-09-17 (×2): 1 [drp] via OPHTHALMIC
  Filled 2013-09-16: qty 2.5

## 2013-09-16 MED ORDER — DOCUSATE SODIUM 100 MG PO CAPS
100.0000 mg | ORAL_CAPSULE | Freq: Two times a day (BID) | ORAL | Status: DC
  Administered 2013-09-17 – 2013-09-18 (×4): 100 mg via ORAL
  Filled 2013-09-16 (×5): qty 1

## 2013-09-16 NOTE — ED Notes (Signed)
Pt from home c/o L arm, L shoulder, and L hand pain that started around noon today. Pt reports that she was seen here on March 17th after running into a door on her L arm. Pt has large hematoma to her L arm from previous injury but denies new injury. Pt adds that she has been "sweeping and mopping a lot." Pt denies SOB, dizziness, but states that she has had chest tightness. Pt has bilateral rhonchi, states she has had some chest congestion. Pt has BS in all 4 quds, last BM yesterday. Pt is A&O and in NAD

## 2013-09-16 NOTE — ED Notes (Addendum)
Pt reports chest tightness at 1200 today. Pt denies chest tightness at present time. Pt complaint of shoulder/arm pain at present time. Pt denies cardiac hx.

## 2013-09-16 NOTE — ED Provider Notes (Signed)
CSN: [id number]     Arrival date & time 09/16/13  1748 History   First MD Initiated Contact with Patient 09/16/13 1809     Chief Complaint  Patient presents with  . Shoulder Pain  . Arm Pain  . Hand Pain    HPI Patient presents to emergency room with complaints of chest pressure and arm pain. Patient states she was doing a lot of sweeping and mopping today. After doing that she started to experience pain in her left arm from her shoulder down to her hand. Patient also experienced some tightness in her chest. The symptoms were more severe earlier and have improved significantly but currently are not completely resolved. Patient thinks the symptoms were waxing and waning slightly. She denies any shortness of breath or dizziness. She's not had any nausea. Movement of her arm does not seem to reproduce the pain.  Patient does have a prior injury about 11 days ago. She anger elbow on a door. She has had some persistent bruising but feels that her symptoms today are significantly different from the pain she had experienced with her elbow injury. She denies any history of heart disease. She does smoke. She does have history of hypertension Past Medical History  Diagnosis Date  . Glaucoma (increased eye pressure)   . Ovarian cyst, right 2009    s/p BSO 2009: OVARIAN FIBROTHECOMA in multiple fluid filled cysts  4.5cm region  . Atrophy of left kidney     Chronic atrophy of the left kidney.  . Chronic constipation   . COPD (chronic obstructive pulmonary disease)     Hyperinflation and heavy smoking history strongly  . HTN (hypertension) 04/12/2012  . Benign breast cysts in female 04/12/2012    Seen on mammography 2009   . UTI (lower urinary tract infection)     History of chronic recurrent UTIs  . Malnutrition 04/28/2012  . Ileus following gastrointestinal surgery 04/28/2012  . SBO (small bowel obstruction)    Past Surgical History  Procedure Laterality Date  . Bilateral oophorectomy  2009     PROCEDURE:  diagnostic laparoscopy, laparotomy with bilateral salpingo-  . Laparotomy  04/23/2012    Procedure: EXPLORATORY LAPAROTOMY;  Surgeon: Valarie MerinoMatthew B Martin, MD;  Location: [facility];  Service: General;  Laterality: N/A;  enterolysis    Family History  Problem Relation Age of Onset  . Diabetes Son   . High blood pressure Son   . Coronary artery disease Son    History  Substance Use Topics  . Smoking status: Current Every Day Smoker -- 0.80 packs/day for 60 years    Types: Cigarettes  . Smokeless tobacco: Never Used  . Alcohol Use: No   OB History   Grav Para Term Preterm Abortions TAB SAB Ect Mult Living                 Review of Systems  All other systems reviewed and are negative.      Allergies  Review of patient's allergies indicates no known allergies.  Home Medications   Current Outpatient Rx  Name  Route  Sig  Dispense  Refill  . aspirin 325 MG tablet   Oral   Take 325 mg by mouth every morning.          . Travoprost, BAK Free, (TRAVATAN) 0.004 % SOLN ophthalmic solution   Both Eyes   Place 1 drop into both eyes at bedtime.           BP 156/91  Pulse 67  Temp(Src) 97.7 F (36.5 C) (Oral)  Resp 16  SpO2 98% Physical Exam  Nursing note and vitals reviewed. Constitutional: No distress.  Frail  HENT:  Head: Normocephalic and atraumatic.  Right Ear: External ear normal.  Left Ear: External ear normal.  Eyes: Conjunctivae are normal. Right eye exhibits no discharge. Left eye exhibits no discharge. No scleral icterus.  Neck: Neck supple. No tracheal deviation present.  Cardiovascular: Normal rate, regular rhythm and intact distal pulses.   Pulmonary/Chest: Effort normal and breath sounds normal. No stridor. No respiratory distress. She has no wheezes. She has no rales.  Abdominal: Soft. Bowel sounds are normal. She exhibits no distension. There is no tenderness. There is no rebound and no guarding.  Musculoskeletal: She exhibits no edema and no  tenderness.  Ecchymoses of the left arm, no pain with range of motion, no erythema or drainage  Neurological: She is alert. She has normal strength. No cranial nerve deficit (no facial droop, extraocular movements intact, no slurred speech) or sensory deficit. She exhibits normal muscle tone. She displays no seizure activity. Coordination normal.  Skin: Skin is warm and dry. No rash noted.  Psychiatric: She has a normal mood and affect.    ED Course  Procedures (including critical care time) Labs Review Labs Reviewed  BASIC METABOLIC PANEL - Abnormal; Notable for the following:    Chloride 95 (*)    Glucose, Bld 100 (*)    GFR calc non Af Amer 61 (*)    GFR calc Af Amer 71 (*)    All other components within normal limits  CBC  PROTIME-INR  I-STAT TROPOININ, ED  I-STAT TROPOININ, ED   Imaging Review Dg Chest Portable 1 View  09/16/2013   CLINICAL DATA:  Cough, congestion, chest pain.  EXAM: PORTABLE CHEST - 1 VIEW  COMPARISON:  01/16/2008  FINDINGS: There is hyperinflation of the lungs compatible with COPD. Scarring in the apices, stable. Cardiomegaly. No acute airspace opacities or effusions. No acute bony abnormality.  IMPRESSION: COPD/chronic changes.  Cardiomegaly.  No active disease.   Electronically Signed   By: Charlett Nose M.D.   On: 09/16/2013 19:11     EKG Interpretation   Date/Time:  Saturday September 16 2013 18:39:05 EDT Ventricular Rate:  75 PR Interval:  155 QRS Duration: 104 QT Interval:  402 QTC Calculation: 449 R Axis:   -54 Text Interpretation:  Sinus rhythm Left atrial enlargement LVH with  secondary repolarization abnormality , changed since ECG dated in 2009  Confirmed by Chrishelle Zito  MD-J, Dornell Grasmick [phone]) on 09/16/2013 6:44:23 PM     Medications  0.9 %  sodium chloride infusion (1,000 mLs Intravenous New Bag/Given 09/16/13 1904)  nitroGLYCERIN (NITROSTAT) SL tablet 0.4 mg (not administered)  aspirin chewable tablet 324 mg (324 mg Oral Given 09/16/13 1905)   nitroGLYCERIN (NITROGLYN) 2 % ointment 1 inch (1 inch Topical Given 09/16/13 1905)    MDM   Final diagnoses:  Chest pain    Pt has history of HTN and tobacco use.  Pt has non specific ECG changes.  Symptoms concerning for possible ACS.  Will consult with medical service for admission, further evaluation of possible cardiac etiology of her chest pain.    Celene Kras, MD 09/16/13 2111

## 2013-09-16 NOTE — H&P (Signed)
[facility] History and Physical  Amber Peatlizabeth A Bordeau BJY:[id number] DOB: 08/28/1933 DOA: 09/16/2013  Referring physician: Dr Linwood DibblesJon Knapp PCP: No primary provider on file.   Chief Complaint: Left arm pain/chest tightness  HPI: Amber Rowland is a 80 y.o. female  With history of hypertension, extensive ongoing tobacco dependence, hypertension, family history of coronary artery disease in his son at age 78, history of glaucoma presented to the ED with left upper extremity pain and chest tightness. Patient stated that this morning around 12 PM after sweeping or mopping the floor she experienced left upper extremity pain and some chest tightness. Patient stated that left upper extremity pain improved initially after aspirin however pain did not completely resolve and was intermittent throughout the day and she subsequently presented to the emergency room. Patient stated that she had left upper extremity pain before in the past, however does usually resolved with aspirin. Patient states had chest tightness occurred for a few minutes with some associated shortness of breath and some associated palpitations which she's had before in the past. Patient denies any orthopnea, no paroxysmal nocturnal dyspnea, no fever, no chills, no nausea, no vomiting, no diaphoresis, no abdominal pain, no diarrhea, no constipation, no dysuria. Patient denies any wheezing. Patient denies any weakness. Patient does endorse upper rest for his symptoms of a cold that she states she's had for several weeks with some congestion and a nonproductive cough. Patient was seen in the emergency room EKG done showed normal sinus rhythm left atrial enlargement with LVH with nonspecific ST-T wave abnormalities. Point-of-care troponin was negative. Basic metabolic profile chloride of 95 otherwise was within normal limits. CBC was within normal limits. INR was 1.0. Chest x-ray showed COPD/chronic changes and cardiomegaly. Patient was given 4  baby aspirin and nitroglycerin with improvement in the left upper extremity pain and chest tightness. We were called to admit the patient for further evaluation and management.   Review of Systems: As per history of present illness otherwise negative. Constitutional:  No weight loss, night sweats, Fevers, chills, fatigue.  HEENT:  No headaches, Difficulty swallowing,Tooth/dental problems,Sore throat,  No sneezing, itching, ear ache, nasal congestion, post nasal drip,  Cardio-vascular:  No Orthopnea, PND, swelling in lower extremities, anasarca, dizziness, palpitations  GI:  No heartburn, indigestion, abdominal pain, nausea, vomiting, diarrhea, change in bowel habits, loss of appetite  Resp:  No shortness of breath with exertion or at rest. No excess mucus, no productive cough, No non-productive cough, No coughing up of blood.No change in color of mucus.No wheezing.No chest wall deformity  Skin:  no rash or lesions.  GU:  no dysuria, change in color of urine, no urgency or frequency. No flank pain.  Musculoskeletal:  No joint pain or swelling. No decreased range of motion. No back pain.  Psych:  No change in mood or affect. No depression or anxiety. No memory loss.   Past Medical History  Diagnosis Date  . Glaucoma (increased eye pressure)   . Ovarian cyst, right 2009    s/p BSO 2009: OVARIAN FIBROTHECOMA in multiple fluid filled cysts  4.5cm region  . Atrophy of left kidney     Chronic atrophy of the left kidney.  . Chronic constipation   . COPD (chronic obstructive pulmonary disease)     Hyperinflation and heavy smoking history strongly  . HTN (hypertension) 04/12/2012  . Benign breast cysts in female 04/12/2012    Seen on mammography 2009   . UTI (lower urinary tract infection)  History of chronic recurrent UTIs  . Malnutrition 04/28/2012  . Ileus following gastrointestinal surgery 04/28/2012  . SBO (small bowel obstruction)   . Severe malnutrition 09/16/2013   Past  Surgical History  Procedure Laterality Date  . Bilateral oophorectomy  2009    PROCEDURE:  diagnostic laparoscopy, laparotomy with bilateral salpingo-  . Laparotomy  04/23/2012    Procedure: EXPLORATORY LAPAROTOMY;  Surgeon: Valarie Merino, MD;  Location: [facility];  Service: General;  Laterality: N/A;  enterolysis    Social History:  reports that she has been smoking Cigarettes.  She has a 48 pack-year smoking history. She has never used smokeless tobacco. She reports that she does not drink alcohol or use illicit drugs.  No Known Allergies  Family History  Problem Relation Age of Onset  . Diabetes Son   . High blood pressure Son   . Coronary artery disease Son      Prior to Admission medications   Medication Sig Start Date End Date Taking? Authorizing Provider  aspirin 325 MG tablet Take 325 mg by mouth every morning.    Yes Historical Provider, MD  Travoprost, BAK Free, (TRAVATAN) 0.004 % SOLN ophthalmic solution Place 1 drop into both eyes at bedtime.    Yes Historical Provider, MD   Physical Exam: Filed Vitals:   09/16/13 2206  BP: 183/97  Pulse: 60  Temp:   Resp:     BP 183/97  Pulse 60  Temp(Src) 97.7 F (36.5 C) (Oral)  Resp 13  SpO2 98%  General:  Elderly female. Cachectic. In no acute cardiopulmonary distress. Eyes: PERRLA, EOMI, normal lids, irises & conjunctiva ENT: grossly normal hearing, lips & tongue. Patient with poor oral dentition. Patient would only 2 teeth left. Neck: no LAD, masses or thyromegaly Cardiovascular: RRR, no m/r/g. No LE edema. Respiratory: CTA bilaterally, no w/r/r. Normal respiratory effort. No crackles. No rhonchi. No wheezing. Abdomen: soft, nt, mild distention, positive bowel sounds, no rebound, no guarding. Skin: no rash or induration seen on limited exam Musculoskeletal: grossly normal tone BUE/BLE Psychiatric: grossly normal mood and affect, speech fluent and appropriate Neurologic: Alert and oriented x3. Cranial nerves II  through XII are grossly intact. Sensation is intact. Gait not tested secondary to safety. No focal deficits.           Labs on Admission:  Basic Metabolic Panel:  Recent Labs Lab 09/16/13 1835  NA 137  K 4.1  CL 95*  CO2 32  GLUCOSE 100*  BUN 12  CREATININE 0.88  CALCIUM 9.9   Liver Function Tests: No results found for this basename: AST, ALT, ALKPHOS, BILITOT, PROT, ALBUMIN,  in the last 168 hours No results found for this basename: LIPASE, AMYLASE,  in the last 168 hours No results found for this basename: AMMONIA,  in the last 168 hours CBC:  Recent Labs Lab 09/16/13 1835  WBC 7.4  HGB 12.6  HCT 39.2  MCV 94.7  PLT 172   Cardiac Enzymes: No results found for this basename: CKTOTAL, CKMB, CKMBINDEX, TROPONINI,  in the last 168 hours  BNP (last 3 results) No results found for this basename: PROBNP,  in the last 8760 hours CBG: No results found for this basename: GLUCAP,  in the last 168 hours  Radiological Exams on Admission: Dg Chest Portable 1 View  09/16/2013   CLINICAL DATA:  Cough, congestion, chest pain.  EXAM: PORTABLE CHEST - 1 VIEW  COMPARISON:  01/16/2008  FINDINGS: There is hyperinflation of the lungs compatible with  COPD. Scarring in the apices, stable. Cardiomegaly. No acute airspace opacities or effusions. No acute bony abnormality.  IMPRESSION: COPD/chronic changes.  Cardiomegaly.  No active disease.   Electronically Signed   By: Charlett Nose M.D.   On: 09/16/2013 19:11    EKG: Independently reviewed. Normal sinus rhythm. Left atrial enlargement. LVH.  Assessment/Plan Principal Problem:   Chest pain Active Problems:   HTN (hypertension), malignant   Chronic constipation   COPD (chronic obstructive pulmonary disease)   Tobacco abuse   Anxiety   Severe malnutrition   Glaucoma   Left arm pain  #1 chest pain/left upper extremity pain Patient with multiple cardiac risk factors of extensive ongoing tobacco abuse, hypertension, family history of  coronary artery disease and a son at age 74 with stent placement per patient. Patient noted to have cardiomegaly on chest x-ray. Patient's upper extremity pain and chest pain improved with nitroglycerin and 4 baby aspirin. Will admit the patient under observation. Cycle cardiac enzymes every 6 hours x3. Check a fasting lipid panel. Check a hemoglobin A1c. Check a 2-D echo. Place patient on aspirin, oxygen, nitroglycerin as needed, low-dose beta blocker, low-dose ACE inhibitor. If LDL is greater than 100 will need to be started on a statin. If cardiac enzymes are abnormal or 2-D echo is abnormal will likely need a cardiology evaluation for further evaluation. Follow.  #2 malignant hypertension Patient's blood pressure in emergency room on presentation was 206/111. Patient was given some nitroglycerin with initial improvement in blood pressure to 149/89 and went back up to 183/97. Patient stated she was told she had hypertension before however was never placed on any medications. EKG with LVH consistent with hypertension. Basic metabolic profile with normal renal function. Will check a UA. Will check a 2-D echo. Will place on low dose beta blocker and low-dose ACE inhibitor.  #3 chronic constipation Will place on stool softener.  #4 tobacco abuse Patient has been counseled on tobacco cessation. Will place on a nicotine patch.  #5 COPD Stable. Will place on Spiriva and Advair. Next is needed.  #6 severe malnutrition Will place on Ensure. Will consult with dietitian.  #7 glaucoma Continue eyedrops.  #8 anxiety Stable.  #9 prophylaxis PPI for GI prophylaxis. Lovenox for DVT prophylaxis.  Code Status: Full Family Communication: Updated patient no family at bedside. Disposition Plan: Admit to telemetry.  Time spent: 71 MINS  [facility] MD [facility] Pager [phone]

## 2013-09-16 NOTE — ED Notes (Signed)
Pt assisted with bedpan to void.

## 2013-09-17 DIAGNOSIS — R079 Chest pain, unspecified: Secondary | ICD-10-CM | POA: Diagnosis not present

## 2013-09-17 DIAGNOSIS — I369 Nonrheumatic tricuspid valve disorder, unspecified: Secondary | ICD-10-CM

## 2013-09-17 DIAGNOSIS — E41 Nutritional marasmus: Secondary | ICD-10-CM | POA: Diagnosis not present

## 2013-09-17 DIAGNOSIS — I10 Essential (primary) hypertension: Secondary | ICD-10-CM | POA: Diagnosis not present

## 2013-09-17 DIAGNOSIS — R011 Cardiac murmur, unspecified: Secondary | ICD-10-CM | POA: Diagnosis present

## 2013-09-17 DIAGNOSIS — J449 Chronic obstructive pulmonary disease, unspecified: Secondary | ICD-10-CM | POA: Diagnosis not present

## 2013-09-17 LAB — CBC
HEMATOCRIT: 36.7 % (ref 36.0–46.0)
HEMOGLOBIN: 11.9 g/dL — AB (ref 12.0–15.0)
MCH: 30.4 pg (ref 26.0–34.0)
MCHC: 32.4 g/dL (ref 30.0–36.0)
MCV: 93.6 fL (ref 78.0–100.0)
Platelets: 169 10*3/uL (ref 150–400)
RBC: 3.92 MIL/uL (ref 3.87–5.11)
RDW: 13.2 % (ref 11.5–15.5)
WBC: 6.2 10*3/uL (ref 4.0–10.5)

## 2013-09-17 LAB — TSH: TSH: 4.783 u[IU]/mL — ABNORMAL HIGH (ref 0.350–4.500)

## 2013-09-17 LAB — BASIC METABOLIC PANEL
BUN: 10 mg/dL (ref 6–23)
CHLORIDE: 104 meq/L (ref 96–112)
CO2: 31 meq/L (ref 19–32)
Calcium: 8.9 mg/dL (ref 8.4–10.5)
Creatinine, Ser: 0.81 mg/dL (ref 0.50–1.10)
GFR calc Af Amer: 79 mL/min — ABNORMAL LOW (ref >90)
GFR calc non Af Amer: 68 mL/min — ABNORMAL LOW (ref >90)
Glucose, Bld: 83 mg/dL (ref 70–99)
POTASSIUM: 3.8 meq/L (ref 3.7–5.3)
Sodium: 141 mEq/L (ref 137–147)

## 2013-09-17 LAB — HEMOGLOBIN A1C
Hgb A1c MFr Bld: 5.4 % (ref <5.7)
MEAN PLASMA GLUCOSE: 108 mg/dL (ref <117)

## 2013-09-17 LAB — GLUCOSE, CAPILLARY: GLUCOSE-CAPILLARY: 85 mg/dL (ref 70–99)

## 2013-09-17 LAB — PROTIME-INR
INR: 1.13 (ref 0.00–1.49)
Prothrombin Time: 14.3 seconds (ref 11.6–15.2)

## 2013-09-17 LAB — LIPID PANEL
Cholesterol: 141 mg/dL (ref 0–200)
HDL: 56 mg/dL (ref >39)
LDL CALC: 73 mg/dL (ref 0–99)
Total CHOL/HDL Ratio: 2.5 RATIO
Triglycerides: 62 mg/dL (ref <150)
VLDL: 12 mg/dL (ref 0–40)

## 2013-09-17 LAB — TROPONIN I: Troponin I: <0.3 ng/mL (ref <0.30)

## 2013-09-17 MED ORDER — LORAZEPAM 0.5 MG PO TABS
0.5000 mg | ORAL_TABLET | Freq: Once | ORAL | Status: AC
  Administered 2013-09-17: 0.5 mg via ORAL
  Filled 2013-09-17: qty 1

## 2013-09-17 MED ORDER — LISINOPRIL 5 MG PO TABS
5.0000 mg | ORAL_TABLET | Freq: Every day | ORAL | Status: DC
  Administered 2013-09-17 – 2013-09-18 (×2): 5 mg via ORAL
  Filled 2013-09-17 (×2): qty 1

## 2013-09-17 MED ORDER — HYDROCHLOROTHIAZIDE 12.5 MG PO CAPS
12.5000 mg | ORAL_CAPSULE | Freq: Every day | ORAL | Status: DC
  Administered 2013-09-17 – 2013-09-18 (×2): 12.5 mg via ORAL
  Filled 2013-09-17 (×2): qty 1

## 2013-09-17 MED ORDER — ASPIRIN EC 325 MG PO TBEC
325.0000 mg | DELAYED_RELEASE_TABLET | Freq: Two times a day (BID) | ORAL | Status: DC | PRN
  Administered 2013-09-17 – 2013-09-18 (×2): 325 mg via ORAL
  Filled 2013-09-17 (×2): qty 1

## 2013-09-17 MED ORDER — INFLUENZA VAC SPLIT QUAD 0.5 ML IM SUSP
0.5000 mL | INTRAMUSCULAR | Status: DC
  Filled 2013-09-17 (×2): qty 0.5

## 2013-09-17 MED ORDER — ENOXAPARIN SODIUM 30 MG/0.3ML ~~LOC~~ SOLN
30.0000 mg | Freq: Every day | SUBCUTANEOUS | Status: DC
  Administered 2013-09-17: 30 mg via SUBCUTANEOUS
  Filled 2013-09-17 (×2): qty 0.3

## 2013-09-17 MED ORDER — DEXTROSE 5 % IV SOLN
1.0000 g | INTRAVENOUS | Status: DC
  Administered 2013-09-17 – 2013-09-18 (×2): 1 g via INTRAVENOUS
  Filled 2013-09-17 (×3): qty 10

## 2013-09-17 MED ORDER — GUAIFENESIN-DM 100-10 MG/5ML PO SYRP: 5.0000 mL | ORAL_SOLUTION | ORAL | Status: DC | PRN

## 2013-09-17 NOTE — Consult Note (Addendum)
CARDIOLOGY CONSULT NOTE   Patient ID: BAILYNN DYK MRN: [id number] DOB/AGE: 10/27/1933 80 y.o.  Admit Date: 09/16/2013  Primary Physician: No primary provider on file.  Primary Cardiologist  Narda Rutherford   Clinical Summary Ms. Nazir is a 80 y.o.female.  I have reviewed all the patient's data. The patient has multiple risk factors. Her troponins have been normal. We are asked to help assess her chest discomfort. There is no diagnostic EKG change. She had a two-dimensional echo today. There was normal left ventricular function. There were no marked valvular abnormalities.     No Known Allergies  Medications Scheduled Medications: . aspirin  325 mg Oral q morning - 10a  . cefTRIAXone (ROCEPHIN)  IV  1 g Intravenous Q24H  . docusate sodium  100 mg Oral BID  . enoxaparin (LOVENOX) injection  30 mg Subcutaneous QHS  . feeding supplement (ENSURE COMPLETE)  237 mL Oral BID BM  . hydrochlorothiazide  12.5 mg Oral Daily  . [START ON 09/18/2013] influenza vac split quadrivalent PF  0.5 mL Intramuscular Tomorrow-1000  . latanoprost  1 drop Both Eyes QHS  . lisinopril  5 mg Oral Daily  . metoprolol tartrate  12.5 mg Oral BID  . mometasone-formoterol  2 puff Inhalation BID  . nicotine  14 mg Transdermal Daily  . pantoprazole  40 mg Oral Daily  . tiotropium  18 mcg Inhalation Daily     Infusions: . sodium chloride 1,000 mL (09/16/13 1904)  . sodium chloride 75 mL/hr at 09/17/13 1237     PRN Medications:  albuterol, aspirin EC, guaiFENesin-dextromethorphan, hydrALAZINE, nitroGLYCERIN, polyethylene glycol, senna-docusate   Past Medical History  Diagnosis Date  . Glaucoma (increased eye pressure)   . Ovarian cyst, right 2009    s/p BSO 2009: OVARIAN FIBROTHECOMA in multiple fluid filled cysts  4.5cm region  . Atrophy of left kidney     Chronic atrophy of the left kidney.  . Chronic constipation   . COPD (chronic obstructive pulmonary disease)     Hyperinflation and  heavy smoking history strongly  . HTN (hypertension) 04/12/2012  . Benign breast cysts in female 04/12/2012    Seen on mammography 2009   . UTI (lower urinary tract infection)     History of chronic recurrent UTIs  . Malnutrition 04/28/2012  . Ileus following gastrointestinal surgery 04/28/2012  . SBO (small bowel obstruction)   . Severe malnutrition 09/16/2013    Past Surgical History  Procedure Laterality Date  . Bilateral oophorectomy  2009    PROCEDURE:  diagnostic laparoscopy, laparotomy with bilateral salpingo-  . Laparotomy  04/23/2012    Procedure: EXPLORATORY LAPAROTOMY;  Surgeon: Valarie Merino, MD;  Location: [facility];  Service: General;  Laterality: N/A;  enterolysis     Family History  Problem Relation Age of Onset  . Diabetes Son   . High blood pressure Son   . Coronary artery disease Son     Social History Ms. Lusignan reports that she has been smoking Cigarettes.  She has a 48 pack-year smoking history. She has never used smokeless tobacco. Ms. Schweigert reports that she does not drink alcohol.  Review of Systems Patient denies fever, chills, headache, sweats, rash, change in vision, change in hearing, nausea vomiting, urinary symptoms. All other systems are reviewed and are negative.  Physical Examination Blood pressure 129/69, pulse 66, temperature 97.9 F (36.6 C), temperature source Oral, resp. rate 18, height 5' (1.524 m), weight 79 lb 11.2 oz (36.152 kg),  SpO2 97.00%.  Intake/Output Summary (Last 24 hours) at 09/17/13 1534 Last data filed at 09/17/13 1401  Gross per 24 hour  Intake 759.67 ml  Output   1200 ml  Net -440.33 ml    The patient is frail with marked kyphosis of the thoracic spine. She is oriented to person time and place. Affect is normal. Lungs reveal scattered rhonchi. Cardiac exam reveals a 2/6 systolic murmur. The abdomen is soft. There is no peripheral edema.  Prior Cardiac Testing/Procedures  Lab Results  Basic Metabolic  Panel:  Recent Labs Lab 09/16/13 1835 09/16/13 2158 09/17/13 0400  NA 137  --  141  K 4.1  --  3.8  CL 95*  --  104  CO2 32  --  31  GLUCOSE 100*  --  83  BUN 12  --  10  CREATININE 0.88  --  0.81  CALCIUM 9.9  --  8.9  MG  --  2.1  --     Liver Function Tests:  Recent Labs Lab 09/16/13 2158  AST 21  ALT 14  ALKPHOS 132*  BILITOT 0.3  PROT 6.4  ALBUMIN 3.2*    CBC:  Recent Labs Lab 09/16/13 1835 09/17/13 0400  WBC 7.4 6.2  HGB 12.6 11.9*  HCT 39.2 36.7  MCV 94.7 93.6  PLT 172 169    Cardiac Enzymes:  Recent Labs Lab 09/16/13 2158 09/17/13 0400 09/17/13 0948  TROPONINI <0.30 <0.30 <0.30    BNP: No components found with this basename: POCBNP,    Radiology: Dg Chest Portable 1 View  09/16/2013   CLINICAL DATA:  Cough, congestion, chest pain.  EXAM: PORTABLE CHEST - 1 VIEW  COMPARISON:  01/16/2008  FINDINGS: There is hyperinflation of the lungs compatible with COPD. Scarring in the apices, stable. Cardiomegaly. No acute airspace opacities or effusions. No acute bony abnormality.  IMPRESSION: COPD/chronic changes.  Cardiomegaly.  No active disease.   Electronically Signed   By: Charlett NoseKevin  Dover M.D.   On: 09/16/2013 19:11     ECG:  I have reviewed the EKGs. There are no diagnostic changes.   Impression and Recommendations    Chest pain     There is no proof of ischemic injury this admission. I do feel it would be appropriate to proceed with in-hospital nuclear stress study. I have scheduled this for tomorrow. If this study reveals no significant scar or ischemia, no further workup will be needed.        UTI (lower urinary tract infection)   Chronic constipation   COPD (chronic obstructive pulmonary disease)   Tobacco abuse   Anxiety   HTN (hypertension), malignant   Severe malnutrition   Glaucoma   Left arm pain    Murmur    She has a systolic murmur. It is probably from aortic valve sclerosis. No further workup.  Jerral BonitoJeff Kailin Principato, MD  09/17/2013,  3:34 PM

## 2013-09-17 NOTE — Progress Notes (Signed)
  Echocardiogram 2D Echocardiogram has been performed.  Amber Rowland, Amber Rowland 09/17/2013, 9:13 AM

## 2013-09-17 NOTE — Progress Notes (Signed)
[facility] PROGRESS NOTE  Amber Rowland ZOX:[id number] DOB: 07/11/1933 DOA: 09/16/2013 PCP: No primary provider on file.  Assessment/Plan  Chest pain/left upper extremity pain Patient with multiple cardiac risk factors of extensive ongoing tobacco abuse, hypertension, family history of coronary artery disease and a son at age 78 with stent placement per patient. Patient noted to have cardiomegaly on chest x-ray. Patient's upper extremity pain and chest pain improved with nitroglycerin and 4 baby aspirin.  Cardiac risk score of 5. -  Troponin neg  -  lipid panel:  wnl -  hemoglobin A1c -  Echo -  Cont aspirin, oxygen, nitroglycerin as needed, low-dose beta blocker, low-dose ACE inhibitor -  Will touch base with cardiology regarding possible inpatient chemical stress test vs. cath  Left arm contusion - ran into cabinet recently and injured lower arm.  Pain related to this injury was at and below elbow and different that upper arm pain related to chest pressure that she felt earlier today.    Malignant hypertension Patient's blood pressure in emergency room on presentation was 206/111.  EKG with LVH consistent with hypertension.  BP still elevated -  Continue low dose beta blocker and will not titrate due to bradycardia -  Increase ACE inhibitor -  Start HCTZ  UTI -  F/u urine culture -  Ceftriaxone day 1  Chronic constipation  -  Continue stool softener.   Tobacco abuse  Patient has been counseled on tobacco cessation. Will place on a nicotine patch.   COPD, stable.   -  Continue Spiriva and Advair -  Cont albuterol prn  Normocytic anemia, suspect vitamin deficiency  -  Iron studies, b12, folate, TSH with AML  Severe protein calorie malnutrition  -  Liberalized diet -  Continue Ensure -  Nutrition consultation  Glaucoma, stable.  Continue eyedrops.   Anxiety, stable.    Diet:  regular Access:  PIV IVF:  yes Proph:  lovenox  Code Status: Full Family  Communication: Patient alone Disposition Plan: Pending further workup of chest pain and treatment of urinary tract infection   Consultants:  Cardiology  Procedures:  Echocardiogram  Chest x-ray  Antibiotics:  Ceftriaxone 3/29 >>   HPI/Subjective:  States she has headache, but otherwise okay.  CP and arm pain resolved with NTG paste.  Still has wheeze and cough attributed to COPD.    Objective: Filed Vitals:   09/16/13 2206 09/16/13 2245 09/16/13 2323 09/17/13 0500  BP: 183/97 170/89  150/85  Pulse: 60 50  65  Temp:  97.4 F (36.3 C)  97.6 F (36.4 C)  TempSrc:  Oral  Oral  Resp:  22  18  Height:  5' (1.524 m)    Weight:  36.288 kg (80 lb)  36.152 kg (79 lb 11.2 oz)  SpO2:  100% 96% 100%    Intake/Output Summary (Last 24 hours) at 09/17/13 0942 Last data filed at 09/17/13 [id number]  Gross per 24 hour  Intake 519.67 ml  Output    900 ml  Net -380.33 ml   Filed Weights   09/16/13 2245 09/17/13 0500  Weight: 36.288 kg (80 lb) 36.152 kg (79 lb 11.2 oz)    Exam:   General:  Cachectic CF, No acute distress  HEENT:  NCAT, MMM  Cardiovascular:  RRR, nl S1, S2, 3/6 systolic murmur RSB, no rubs or gallops 2+ pulses, warm extremities  Respiratory:  Diminished bilateral BS with scattered wheeze, + rhonchi, no rales, no increased WOB  Abdomen:  NABS, soft, mildly distended, small incisional hernia superior midline, nontender except for reducible hernia  MSK:   Decreased tone and bulk, no LEE  Neuro:  Grossly intact  Data Reviewed: Basic Metabolic Panel:  Recent Labs Lab 09/16/13 1835 09/16/13 2158 09/17/13 0400  NA 137  --  141  K 4.1  --  3.8  CL 95*  --  104  CO2 32  --  31  GLUCOSE 100*  --  83  BUN 12  --  10  CREATININE 0.88  --  0.81  CALCIUM 9.9  --  8.9  MG  --  2.1  --    Liver Function Tests:  Recent Labs Lab 09/16/13 2158  AST 21  ALT 14  ALKPHOS 132*  BILITOT 0.3  PROT 6.4  ALBUMIN 3.2*   No results found for this basename:  LIPASE, AMYLASE,  in the last 168 hours No results found for this basename: AMMONIA,  in the last 168 hours CBC:  Recent Labs Lab 09/16/13 1835 09/17/13 0400  WBC 7.4 6.2  HGB 12.6 11.9*  HCT 39.2 36.7  MCV 94.7 93.6  PLT 172 169   Cardiac Enzymes:  Recent Labs Lab 09/16/13 2158 09/17/13 0400  TROPONINI <0.30 <0.30   BNP (last 3 results) No results found for this basename: PROBNP,  in the last 8760 hours CBG:  Recent Labs Lab 09/16/13 2259  GLUCAP 85    No results found for this or any previous visit (from the past 240 hour(s)).   Studies: Dg Chest Portable 1 View  09/16/2013   CLINICAL DATA:  Cough, congestion, chest pain.  EXAM: PORTABLE CHEST - 1 VIEW  COMPARISON:  01/16/2008  FINDINGS: There is hyperinflation of the lungs compatible with COPD. Scarring in the apices, stable. Cardiomegaly. No acute airspace opacities or effusions. No acute bony abnormality.  IMPRESSION: COPD/chronic changes.  Cardiomegaly.  No active disease.   Electronically Signed   By: Charlett Nose M.D.   On: 09/16/2013 19:11    Scheduled Meds: . aspirin  325 mg Oral q morning - 10a  . cefTRIAXone (ROCEPHIN)  IV  1 g Intravenous Q24H  . docusate sodium  100 mg Oral BID  . enoxaparin (LOVENOX) injection  40 mg Subcutaneous QHS  . feeding supplement (ENSURE COMPLETE)  237 mL Oral BID BM  . [START ON 09/18/2013] influenza vac split quadrivalent PF  0.5 mL Intramuscular Tomorrow-1000  . latanoprost  1 drop Both Eyes QHS  . lisinopril  2.5 mg Oral Daily  . metoprolol tartrate  12.5 mg Oral BID  . mometasone-formoterol  2 puff Inhalation BID  . nicotine  14 mg Transdermal Daily  . pantoprazole  40 mg Oral Daily  . tiotropium  18 mcg Inhalation Daily   Continuous Infusions: . sodium chloride 1,000 mL (09/16/13 1904)  . sodium chloride 75 mL/hr at 09/17/13 0009    Principal Problem:   Chest pain Active Problems:   UTI (lower urinary tract infection)   Chronic constipation   COPD (chronic  obstructive pulmonary disease)   Tobacco abuse   Anxiety   HTN (hypertension), malignant   Severe malnutrition   Glaucoma   Left arm pain    Time spent: 30 min    Nancie Bocanegra  [facility] Pager [phone]. If 7PM-7AM, please contact night-coverage at [email or web address], password [facility] 09/17/2013, 9:42 AM  LOS: 1 day

## 2013-09-18 ENCOUNTER — Ambulatory Visit (HOSPITAL_COMMUNITY)
Admit: 2013-09-18 | Discharge: 2013-09-18 | Disposition: A | Discharge status: Home / Self Care | Payer: Medicare Other | Attending: Cardiology | Admitting: Cardiology

## 2013-09-18 ENCOUNTER — Other Ambulatory Visit: Payer: Self-pay

## 2013-09-18 DIAGNOSIS — E43 Unspecified severe protein-calorie malnutrition: Secondary | ICD-10-CM | POA: Insufficient documentation

## 2013-09-18 DIAGNOSIS — N390 Urinary tract infection, site not specified: Secondary | ICD-10-CM | POA: Diagnosis not present

## 2013-09-18 DIAGNOSIS — J449 Chronic obstructive pulmonary disease, unspecified: Secondary | ICD-10-CM | POA: Diagnosis not present

## 2013-09-18 DIAGNOSIS — M79609 Pain in unspecified limb: Secondary | ICD-10-CM | POA: Diagnosis not present

## 2013-09-18 DIAGNOSIS — R011 Cardiac murmur, unspecified: Secondary | ICD-10-CM

## 2013-09-18 DIAGNOSIS — R079 Chest pain, unspecified: Secondary | ICD-10-CM | POA: Diagnosis not present

## 2013-09-18 DIAGNOSIS — A498 Other bacterial infections of unspecified site: Secondary | ICD-10-CM | POA: Diagnosis not present

## 2013-09-18 LAB — IRON AND TIBC
Iron: 66 ug/dL (ref 42–135)
Saturation Ratios: 22 % (ref 20–55)
TIBC: 302 ug/dL (ref 250–470)
UIBC: 236 ug/dL (ref 125–400)

## 2013-09-18 LAB — CBC
HCT: 37.2 % (ref 36.0–46.0)
Hemoglobin: 11.6 g/dL — ABNORMAL LOW (ref 12.0–15.0)
MCH: 30.1 pg (ref 26.0–34.0)
MCHC: 31.2 g/dL (ref 30.0–36.0)
MCV: 96.6 fL (ref 78.0–100.0)
PLATELETS: 158 10*3/uL (ref 150–400)
RBC: 3.85 MIL/uL — ABNORMAL LOW (ref 3.87–5.11)
RDW: 13.5 % (ref 11.5–15.5)
WBC: 6.9 10*3/uL (ref 4.0–10.5)

## 2013-09-18 LAB — FOLATE RBC: RBC FOLATE: 708 ng/mL — AB (ref >280)

## 2013-09-18 LAB — BASIC METABOLIC PANEL
BUN: 16 mg/dL (ref 6–23)
CALCIUM: 9.6 mg/dL (ref 8.4–10.5)
CHLORIDE: 103 meq/L (ref 96–112)
CO2: 29 mEq/L (ref 19–32)
Creatinine, Ser: 1.06 mg/dL (ref 0.50–1.10)
GFR calc non Af Amer: 49 mL/min — ABNORMAL LOW (ref >90)
GFR, EST AFRICAN AMERICAN: 57 mL/min — AB (ref >90)
Glucose, Bld: 98 mg/dL (ref 70–99)
Potassium: 4.1 mEq/L (ref 3.7–5.3)
Sodium: 142 mEq/L (ref 137–147)

## 2013-09-18 LAB — FERRITIN: FERRITIN: 20 ng/mL (ref 10–291)

## 2013-09-18 LAB — TSH: TSH: 2.991 u[IU]/mL (ref 0.350–4.500)

## 2013-09-18 LAB — VITAMIN B12: Vitamin B-12: 199 pg/mL — ABNORMAL LOW (ref 211–911)

## 2013-09-18 MED ORDER — NITROGLYCERIN 0.4 MG SL SUBL
SUBLINGUAL_TABLET | SUBLINGUAL | Status: AC
  Filled 2013-09-18: qty 1

## 2013-09-18 MED ORDER — NICOTINE 14 MG/24HR TD PT24: 14.0000 mg | MEDICATED_PATCH | Freq: Every day | TRANSDERMAL | Status: DC

## 2013-09-18 MED ORDER — BISACODYL 10 MG RE SUPP: 10.0000 mg | Freq: Every day | RECTAL | Status: DC | PRN

## 2013-09-18 MED ORDER — TECHNETIUM TC 99M SESTAMIBI GENERIC - CARDIOLITE
10.0000 | Freq: Once | INTRAVENOUS | Status: AC | PRN
  Administered 2013-09-18: 10 via INTRAVENOUS

## 2013-09-18 MED ORDER — ALBUTEROL SULFATE HFA 108 (90 BASE) MCG/ACT IN AERS: 2.0000 | INHALATION_SPRAY | Freq: Four times a day (QID) | RESPIRATORY_TRACT | Status: DC | PRN

## 2013-09-18 MED ORDER — DSS 100 MG PO CAPS: 100.0000 mg | ORAL_CAPSULE | Freq: Two times a day (BID) | ORAL | Status: DC

## 2013-09-18 MED ORDER — REGADENOSON 0.4 MG/5ML IV SOLN
INTRAVENOUS | Status: AC
  Filled 2013-09-18: qty 5

## 2013-09-18 MED ORDER — CIPROFLOXACIN HCL 250 MG PO TABS: 250.0000 mg | ORAL_TABLET | Freq: Two times a day (BID) | ORAL | Status: DC

## 2013-09-18 MED ORDER — TIOTROPIUM BROMIDE MONOHYDRATE 18 MCG IN CAPS: 18.0000 ug | ORAL_CAPSULE | Freq: Every day | RESPIRATORY_TRACT | Status: DC

## 2013-09-18 MED ORDER — CYANOCOBALAMIN 1000 MCG PO TABS: 1000.0000 ug | ORAL_TABLET | Freq: Every day | ORAL | Status: DC

## 2013-09-18 MED ORDER — VITAMIN B-12 1000 MCG PO TABS
1000.0000 ug | ORAL_TABLET | Freq: Every day | ORAL | Status: DC
  Filled 2013-09-18 (×2): qty 1

## 2013-09-18 MED ORDER — MOMETASONE FURO-FORMOTEROL FUM 100-5 MCG/ACT IN AERO: 2.0000 | INHALATION_SPRAY | Freq: Two times a day (BID) | RESPIRATORY_TRACT | Status: DC

## 2013-09-18 MED ORDER — ENSURE COMPLETE PO LIQD: 237.0000 mL | Freq: Two times a day (BID) | ORAL | Status: DC

## 2013-09-18 MED ORDER — METOPROLOL TARTRATE 12.5 MG HALF TABLET: 12.5000 mg | ORAL_TABLET | Freq: Two times a day (BID) | ORAL | Status: DC

## 2013-09-18 MED ORDER — TECHNETIUM TC 99M SESTAMIBI GENERIC - CARDIOLITE
30.0000 | Freq: Once | INTRAVENOUS | Status: AC | PRN
  Administered 2013-09-18: 30 via INTRAVENOUS

## 2013-09-18 MED ORDER — LISINOPRIL-HYDROCHLOROTHIAZIDE 10-12.5 MG PO TABS: 1.0000 | ORAL_TABLET | Freq: Every day | ORAL | Status: DC

## 2013-09-18 MED ORDER — NITROGLYCERIN 0.4 MG SL SUBL
0.4000 mg | SUBLINGUAL_TABLET | SUBLINGUAL | Status: DC | PRN
  Administered 2013-09-18: 0.4 mg via SUBLINGUAL

## 2013-09-18 MED ORDER — MELATONIN 3 MG PO CAPS: 3.0000 mg | ORAL_CAPSULE | Freq: Every evening | ORAL | Status: DC | PRN

## 2013-09-18 MED ORDER — OMEPRAZOLE 20 MG PO CPDR: 20.0000 mg | DELAYED_RELEASE_CAPSULE | Freq: Every day | ORAL | Status: DC

## 2013-09-18 MED ORDER — POLYETHYLENE GLYCOL 3350 17 G PO PACK: 17.0000 g | PACK | Freq: Every day | ORAL | Status: DC | PRN

## 2013-09-18 MED ORDER — REGADENOSON 0.4 MG/5ML IV SOLN
0.4000 mg | Freq: Once | INTRAVENOUS | Status: AC
  Administered 2013-09-18: 0.4 mg via INTRAVENOUS
  Filled 2013-09-18: qty 5

## 2013-09-18 NOTE — Progress Notes (Signed)
[facility] PROGRESS NOTE  Amber Rowland ZOX:[id number] DOB: 03/27/1934 DOA: 09/16/2013 PCP: No primary provider on file.  Assessment/Plan  Chest pain/left upper extremity pain Patient with multiple cardiac risk factors of extensive ongoing tobacco abuse, hypertension, family history of coronary artery disease and a son at age 78 with stent placement per patient. Patient noted to have cardiomegaly on chest x-ray. Patient's upper extremity pain and chest pain improved with nitroglycerin and 4 baby aspirin.  Cardiac risk score of 5. -  Troponin neg  -  lipid panel:  wnl -  hemoglobin A1c 5.4 -  Echo:  Ejection fraction of 60-65%, normal wall thickness, and trivial mitral valve regurgitation, mild tricuspid valve regurgitation, unable to comment on possibility of diastolic dysfunction. There were no wall motion abnormalities. -  Cont aspirin, oxygen, nitroglycerin as needed, low-dose beta blocker, low-dose ACE inhibitor -  Nuclear medicine stress test pending, if normal patient may discharge to home. If abnormal, consider cardiac catheterization.  Left arm contusion - ran into cabinet recently and injured lower arm.  Pain related to this injury was at and below elbow and different that upper arm pain related to chest pressure that she felt earlier today.    Malignant hypertension Patient's blood pressure in emergency room on presentation was 206/111.  EKG with LVH consistent with hypertension.  BP within normal limits. -  Continue low dose beta blocker, increased dose ACE inhibitor, and HCTZ.  UTI -  F/u urine culture -  Ceftriaxone day 2  Chronic constipation  -  Continue stool softener.  -  Continue when necessary MiraLax - Add when necessary bisacodyl suppository  Tobacco abuse  Patient has been counseled on tobacco cessation. Will place on a nicotine patch.   COPD, stable.   -  Continue Spiriva and Advair -  Cont albuterol prn  Normocytic anemia, suspect vitamin  deficiency  -  Iron studies, b12, folate, TSH pending  Severe protein calorie malnutrition  -  Liberalized diet -  Continue Ensure -  Nutrition consultation  Glaucoma, stable.  Continue eyedrops.   Anxiety, stable.   -  Wants Rx for melatonin at discharge  Diet:  regular Access:  PIV IVF:  yes Proph:  lovenox  Code Status: Full Family Communication: Patient alone Disposition Plan: Pending stress test.   Consultants:  Cardiology  Procedures:  Echocardiogram  Chest x-ray  Antibiotics:  Ceftriaxone 3/29 >>   HPI/Subjective:  States she had episode of chest pressure this morning.    Objective: Filed Vitals:   09/17/13 1401 09/17/13 2040 09/17/13 2107 09/18/13 0500  BP: 129/69  126/79 131/72  Pulse: 66  68 66  Temp: 97.9 F (36.6 C)  98.1 F (36.7 C) 98.2 F (36.8 C)  TempSrc: Oral  Oral Oral  Resp: 18  22 16   Height:      Weight:    37.83 kg (83 lb 6.4 oz)  SpO2: 97% 91% 96% 97%    Intake/Output Summary (Last 24 hours) at 09/18/13 0728 Last data filed at 09/18/13 0600  Gross per 24 hour  Intake   2039 ml  Output    950 ml  Net   1089 ml   Filed Weights   09/16/13 2245 09/17/13 0500 09/18/13 0500  Weight: 36.288 kg (80 lb) 36.152 kg (79 lb 11.2 oz) 37.83 kg (83 lb 6.4 oz)    Exam:   General:  Cachectic CF, No acute distress  HEENT:  NCAT, MMM  Cardiovascular:  RRR, nl S1, S2, 3/6  systolic murmur RSB, no rubs or gallops 2+ pulses, warm extremities  Respiratory:  Diminished bilateral BS with scattered wheeze, - rhonchi, no rales, no increased WOB  Abdomen:   NABS, soft, mildly distended, small incisional hernia superior midline, nontender except for reducible hernia  MSK:   Decreased tone and bulk, no LEE  Neuro:  Grossly intact  Data Reviewed: Basic Metabolic Panel:  Recent Labs Lab 09/16/13 1835 09/16/13 2158 09/17/13 0400 09/18/13 0356  NA 137  --  141 142  K 4.1  --  3.8 4.1  CL 95*  --  104 103  CO2 32  --  31 29   GLUCOSE 100*  --  83 98  BUN 12  --  10 16  CREATININE 0.88  --  0.81 1.06  CALCIUM 9.9  --  8.9 9.6  MG  --  2.1  --   --    Liver Function Tests:  Recent Labs Lab 09/16/13 2158  AST 21  ALT 14  ALKPHOS 132*  BILITOT 0.3  PROT 6.4  ALBUMIN 3.2*   No results found for this basename: LIPASE, AMYLASE,  in the last 168 hours No results found for this basename: AMMONIA,  in the last 168 hours CBC:  Recent Labs Lab 09/16/13 1835 09/17/13 0400 09/18/13 0356  WBC 7.4 6.2 6.9  HGB 12.6 11.9* 11.6*  HCT 39.2 36.7 37.2  MCV 94.7 93.6 96.6  PLT 172 169 158   Cardiac Enzymes:  Recent Labs Lab 09/16/13 2158 09/17/13 0400 09/17/13 0948  TROPONINI <0.30 <0.30 <0.30   BNP (last 3 results) No results found for this basename: PROBNP,  in the last 8760 hours CBG:  Recent Labs Lab 09/16/13 2259  GLUCAP 85    No results found for this or any previous visit (from the past 240 hour(s)).   Studies: Dg Chest Portable 1 View  09/16/2013   CLINICAL DATA:  Cough, congestion, chest pain.  EXAM: PORTABLE CHEST - 1 VIEW  COMPARISON:  01/16/2008  FINDINGS: There is hyperinflation of the lungs compatible with COPD. Scarring in the apices, stable. Cardiomegaly. No acute airspace opacities or effusions. No acute bony abnormality.  IMPRESSION: COPD/chronic changes.  Cardiomegaly.  No active disease.   Electronically Signed   By: Charlett NoseKevin  Dover M.D.   On: 09/16/2013 19:11    Scheduled Meds: . aspirin  325 mg Oral q morning - 10a  . cefTRIAXone (ROCEPHIN)  IV  1 g Intravenous Q24H  . docusate sodium  100 mg Oral BID  . enoxaparin (LOVENOX) injection  30 mg Subcutaneous QHS  . feeding supplement (ENSURE COMPLETE)  237 mL Oral BID BM  . hydrochlorothiazide  12.5 mg Oral Daily  . influenza vac split quadrivalent PF  0.5 mL Intramuscular Tomorrow-1000  . latanoprost  1 drop Both Eyes QHS  . lisinopril  5 mg Oral Daily  . metoprolol tartrate  12.5 mg Oral BID  . mometasone-formoterol  2  puff Inhalation BID  . nicotine  14 mg Transdermal Daily  . pantoprazole  40 mg Oral Daily  . tiotropium  18 mcg Inhalation Daily   Continuous Infusions: . sodium chloride 1,000 mL (09/18/13 0020)    Principal Problem:   Chest pain Active Problems:   UTI (lower urinary tract infection)   Chronic constipation   COPD (chronic obstructive pulmonary disease)   Tobacco abuse   Anxiety   HTN (hypertension), malignant   Severe malnutrition   Glaucoma   Left arm pain   Murmur  Time spent: 30 min    Elenna Spratling, [facility]  [facility] Pager [phone]. If 7PM-7AM, please contact night-coverage at [email or web address], password [facility] 09/18/2013, 7:28 AM  LOS: 2 days

## 2013-09-18 NOTE — Discharge Summary (Signed)
Physician Discharge Summary  Amber Rowland KDT:[id number] DOB: 10-Sep-1933 DOA: 09/16/2013  PCP: No primary provider on file.  Admit date: 09/16/2013 Discharge date: 09/18/2013  Recommendations for Outpatient Follow-up:  1. Followup with primary care doctor within one week of discharge for follow up chest pain and COPD.  please followup on final report of urine culture.  Discharge Diagnoses:  Principal Problem:   Chest pain Active Problems:   UTI (lower urinary tract infection)   Chronic constipation   COPD (chronic obstructive pulmonary disease)   Tobacco abuse   Anxiety   HTN (hypertension), malignant   Severe malnutrition   Glaucoma   Left arm pain   Murmur   Protein-calorie malnutrition, severe   Discharge Condition: Stable, improved  Diet recommendation: Regular with supplements  Wt Readings from Last 3 Encounters:  09/18/13 37.83 kg (83 lb 6.4 oz)  06/10/12 37.286 kg (82 lb 3.2 oz)  05/09/12 35.743 kg (78 lb 12.8 oz)    History of present illness:  Amber Rowland is a 80 y.o. female  With history of hypertension, extensive ongoing tobacco dependence, hypertension, family history of coronary artery disease in his son at age 64, history of glaucoma presented to the ED with left upper extremity pain and chest tightness. Patient stated that this morning around 12 PM after sweeping or mopping the floor she experienced left upper extremity pain and some chest tightness. Patient stated that left upper extremity pain improved initially after aspirin however pain did not completely resolve and was intermittent throughout the day and she subsequently presented to the emergency room. Patient stated that she had left upper extremity pain before in the past, however does usually resolved with aspirin. Patient states had chest tightness occurred for a few minutes with some associated shortness of breath and some associated palpitations which she's had before in the past. Patient  denies any orthopnea, no paroxysmal nocturnal dyspnea, no fever, no chills, no nausea, no vomiting, no diaphoresis, no abdominal pain, no diarrhea, no constipation, no dysuria. Patient denies any wheezing. Patient denies any weakness. Patient does endorse upper rest for his symptoms of a cold that she states she's had for several weeks with some congestion and a nonproductive cough.  Patient was seen in the emergency room EKG done showed normal sinus rhythm left atrial enlargement with LVH with nonspecific ST-T wave abnormalities. Point-of-care troponin was negative. Basic metabolic profile chloride of 95 otherwise was within normal limits. CBC was within normal limits. INR was 1.0. Chest x-ray showed COPD/chronic changes and cardiomegaly. Patient was given 4 baby aspirin and nitroglycerin with improvement in the left upper extremity pain and chest tightness.  We were called to admit the patient for further evaluation and management.   Hospital Course:   Chest and left upper extremity pain, likely secondary to COPD. The patient had multiple cardiac risk factors with extensive tobacco use, uncontrolled hypertension, family history of coronary artery disease. Her chest x-ray demonstrated cardiomegaly. Her cardiac risk or was 5. She was given 4 baby aspirin and nitroglycerin her pain resolved. Telemetry demonstrated sinus rhythm, troponins were negative. EKG did not demonstrate acute ischemic changes. She was seen by cardiology. Echocardiogram showed an ejection fraction of 60-65% with normal wall thickness, and trivial mitral valve regurgitation. She had mild tricuspid valve regurgitation. No comment on diastolic dysfunction. She was continued on aspirin and when necessary nitroglycerin, low-dose beta blocker, and low-dose ACE inhibitor. Nuclear medicine stress test was negative. Recommended that she take medication for acid reflux to  see if that helps, and she takes medications for COPD.  Left arm contusion  secondary to running into the At her home. The pain in her left lower arm with different than the chest pressure with left upper arm pain that she experienced at presentation.  Malignant hypertension, blood pressure elevated to 206/111 in the emergency department. EKG demonstrated left ventricular hypertrophy. Her blood pressure with within normal limits. She continued low-dose beta blocker, and ACE inhibitor, and HCTZ.  Escherichia coli urinary tract infection, she was started on ceftriaxone and completed 2 days of IV antibiotics in the hospital. Recommended she complete a full course of oral antibiotics for infection plan discharge.  Chronic constipation, she was given stool softeners and daily MiraLax. She was told to use as needed bisacodyl suppositories.   Tobacco abuse, counseled on tobacco cessation and placed on a nicotine patch. Given a nicotine patch prescription for discharge.  COPD, stable, but may have been contributing to chest pressure. She did not have increased cough or sputum suggestive of acute exacerbation. She continued Advair and Spiriva.  Normocytic anemia, suspected vitamin deficiency.  Her iron levels were normal however her ferritin was 20. Encouraged her to eat more iron-containing foods. Her folate was 708, but her vitamin B12 level was 199. She was started on oral vitamin B12 supplements. TSH was 2.99.  Severe protein calorie malnutrition. She was seen by nutrition. She was given a regular diet and Ensure supplements. The case manager met with her to discuss possible home health services, which the patient politely declined. She was given information for Meals on Wheels and advised to use [employer] to boost her calories.  Glaucoma, stable, but progressively worsening over the last couple of years. She continued her eyedrops, and she should followup with her ophthalmologist.  Anxiety, stable. She states she has some insomnia and I encouraged her to use  melatonin at least 3 mg at bedtime to help her sleep.   Consultants:  Cardiology Procedures:  Echocardiogram  Chest x-ray NM stress test Antibiotics:  Ceftriaxone 3/29 >>    Discharge Exam: Filed Vitals:   09/18/13 1504  BP: 162/79  Pulse: 69  Temp: 97.4 F (36.3 C)  Resp: 17   Filed Vitals:   09/18/13 1319 09/18/13 1321 09/18/13 1325 09/18/13 1504  BP: 196/140 189/118 182/102 162/79  Pulse: 87 84 83 69  Temp:    97.4 F (36.3 C)  TempSrc:    Oral  Resp:    17  Height:      Weight:      SpO2:    98%    General: Cachectic CF, No acute distress  HEENT: NCAT, MMM  Cardiovascular: RRR, nl S1, S2, 3/6 systolic murmur RSB, no rubs or gallops 2+ pulses, warm extremities  Respiratory: Diminished bilateral BS with scattered wheeze, - rhonchi, no rales, no increased WOB  Abdomen: NABS, soft, mildly distended, small incisional hernia superior midline, nontender except for reducible hernia  MSK: Decreased tone and bulk, no LEE  Neuro: Grossly intact   Discharge Instructions      Discharge Orders   Future Orders Complete By Expires   Call MD for:  difficulty breathing, headache or visual disturbances  As directed    Call MD for:  extreme fatigue  As directed    Call MD for:  hives  As directed    Call MD for:  persistant dizziness or light-headedness  As directed    Call MD for:  persistant nausea and vomiting  As directed    Call MD for:  severe uncontrolled pain  As directed    Call MD for:  temperature >100.4  As directed    Diet general  As directed    Discharge instructions  As directed    Comments:     You were hospitalized with chest pain.  Your chest pain may have been due to severe COPD or, less likely, due to some acid reflux. Please take Dulera and Spiriva every day as prescribed to try to improve your breathing. This may cause you to have fewer episodes of chest pain. You may use albuterol as needed for shortness of breath.  Please also take omeprazole, and  acid reflux medication, once a day. Followup with your primary care doctor in approximately 2 weeks to review whether these changes to your medications have helped your chest pain. Your stress test did not show any evidence of strain on your heart. You are very thin, and need more calories. Please consider starting Meals on Wheels, adding foods like butter to your meals, and start using [employer] to increase your calories. Increasing your calories may help your breathing get better.  You were vitamin B12 deficient, so please start taking vitamin B12 supplements daily.  I have given you some nicotine patches to use to help quit smoking. If you have worsening difficulty breathing, chest pain with shortness of breath, sweating, nausea, please return immediately to the hospital.9   Increase activity slowly  As directed        Medication List         albuterol 108 (90 BASE) MCG/ACT inhaler  Commonly known as:  PROVENTIL HFA;VENTOLIN HFA  Inhale 2 puffs into the lungs every 6 (six) hours as needed for wheezing or shortness of breath.     aspirin 325 MG tablet  Take 325 mg by mouth every morning.     bisacodyl 10 MG suppository  Commonly known as:  DULCOLAX  Place 1 suppository (10 mg total) rectally daily as needed for moderate constipation.     ciprofloxacin 250 MG tablet  Commonly known as:  CIPRO  Take 1 tablet (250 mg total) by mouth 2 (two) times daily.     cyanocobalamin 1000 MCG tablet  Take 1 tablet (1,000 mcg total) by mouth daily.     DSS 100 MG Caps  Take 100 mg by mouth 2 (two) times daily.     feeding supplement (ENSURE COMPLETE) Liqd  Take 237 mLs by mouth 2 (two) times daily between meals.     lisinopril-hydrochlorothiazide 10-12.5 MG per tablet  Commonly known as:  PRINZIDE,ZESTORETIC  Take 1 tablet by mouth daily.     Melatonin 3 MG Caps  Take 1 capsule (3 mg total) by mouth at bedtime as needed (sleeplessness).     metoprolol tartrate 12.5 mg Tabs  tablet  Commonly known as:  LOPRESSOR  Take 0.5 tablets (12.5 mg total) by mouth 2 (two) times daily.     mometasone-formoterol 100-5 MCG/ACT Aero  Commonly known as:  DULERA  Inhale 2 puffs into the lungs 2 (two) times daily.     nicotine 14 mg/24hr patch  Commonly known as:  NICODERM CQ - dosed in mg/24 hours  Place 1 patch (14 mg total) onto the skin daily.     omeprazole 20 MG capsule  Commonly known as:  PRILOSEC  Take 1 capsule (20 mg total) by mouth daily.     polyethylene glycol packet  Commonly known as:  MIRALAX / GLYCOLAX  Take 17 g by mouth daily as needed for mild constipation.     tiotropium 18 MCG inhalation capsule  Commonly known as:  SPIRIVA  Place 1 capsule (18 mcg total) into inhaler and inhale daily.     Travoprost (BAK Free) 0.004 % Soln ophthalmic solution  Commonly known as:  TRAVATAN  Place 1 drop into both eyes at bedtime.       Follow-up Information   Follow up with Primary Care Doctor. Schedule an appointment as soon as possible for a visit in 1 week.      Follow up with Dola Argyle, MD. (Office will call you for your followup appointment)    Specialty:  Cardiology   Contact information:   [address] [place] [address] [phone]       The results of significant diagnostics from this hospitalization (including imaging, microbiology, ancillary and laboratory) are listed below for reference.    Significant Diagnostic Studies: Dg Elbow Complete Left  09/26/13   CLINICAL DATA:  80 year old female with left elbow injury and pain.  EXAM: LEFT ELBOW - COMPLETE 3+ VIEW  COMPARISON:  02/03/2006  FINDINGS: There is no evidence of acute fracture, subluxation or dislocation.  There is no evidence of joint effusion.  Radial soft tissue swelling is noted.  Mild diffuse osteopenia is present.  No focal bony lesions are identified.  IMPRESSION: Soft tissue swelling without acute bony abnormality.   Electronically Signed   By: Hassan Rowan M.D.   On: Sep 26, 2013 16:41   Nm Myocar Multi W/spect W/wall Motion / Ef  09/18/2013   CLINICAL DATA:  80 year old with COPD, hyperlipidemia, hypertension, chest discomfort  EXAM: MYOCARDIAL IMAGING WITH SPECT (REST AND PHARMACOLOGIC-STRESS)  GATED LEFT VENTRICULAR WALL MOTION STUDY  LEFT VENTRICULAR EJECTION FRACTION  TECHNIQUE: Standard myocardial SPECT imaging was performed after resting intravenous injection of 10 mCi Tc-71msestamibi. Subsequently, intravenous infusion of Lexiscan was performed under the supervision of the Cardiology staff. At peak effect of the drug, 30 mCi Tc-952mestamibi was injected intravenously and standard myocardial SPECT imaging was performed. Quantitative gated imaging was also performed to evaluate left ventricular wall motion, and estimate left ventricular ejection fraction.  COMPARISON:  None.  FINDINGS: There is decreased radiotracer uptake along the basal septal wall seen at both rest and stress. Bowel loop attenuation noted at both rest and stress. Otherwise, all myocardial segments demonstrate homogeneous radiotracer uptake. There is no evidence of ischemia. Ejection fraction 74% with no wall motion abnormalities.  IMPRESSION: Overall low risk nuclear stress test. Sensitivity and specificity are reduced by bowel loop attenuation. There is no significant ischemia identified. Ejection fraction 74% with no wall motion abnormalities.   Electronically Signed   By: MaCandee Furbish On: 09/18/2013 14:42   Dg Chest Portable 1 View  09/16/2013   CLINICAL DATA:  Cough, congestion, chest pain.  EXAM: PORTABLE CHEST - 1 VIEW  COMPARISON:  01/16/2008  FINDINGS: There is hyperinflation of the lungs compatible with COPD. Scarring in the apices, stable. Cardiomegaly. No acute airspace opacities or effusions. No acute bony abnormality.  IMPRESSION: COPD/chronic changes.  Cardiomegaly.  No active disease.   Electronically Signed   By: KeRolm Baptise.D.   On: 09/16/2013 19:11     Microbiology: Recent Results (from the past 240 hour(s))  URINE CULTURE     Status: None   Collection Time    09/16/13 11:05 PM      Result Value  Ref Range Status   Specimen Description URINE, CLEAN CATCH   Final   Special Requests NONE   Final   Culture  Setup Time     Final   Value: 09/17/2013 01:39     Performed at [facility]     Final   Value: >=100,000 COLONIES/ML     Performed at [facility]   Culture     Final   Value: ESCHERICHIA COLI     Performed at [facility]   Report Status PENDING   Incomplete     Labs: Basic Metabolic Panel:  Recent Labs Lab 09/16/13 1835 09/16/13 2158 09/17/13 0400 09/18/13 0356  NA 137  --  141 142  K 4.1  --  3.8 4.1  CL 95*  --  104 103  CO2 32  --  31 29  GLUCOSE 100*  --  83 98  BUN 12  --  10 16  CREATININE 0.88  --  0.81 1.06  CALCIUM 9.9  --  8.9 9.6  MG  --  2.1  --   --    Liver Function Tests:  Recent Labs Lab 09/16/13 2158  AST 21  ALT 14  ALKPHOS 132*  BILITOT 0.3  PROT 6.4  ALBUMIN 3.2*   No results found for this basename: LIPASE, AMYLASE,  in the last 168 hours No results found for this basename: AMMONIA,  in the last 168 hours CBC:  Recent Labs Lab 09/16/13 1835 09/17/13 0400 09/18/13 0356  WBC 7.4 6.2 6.9  HGB 12.6 11.9* 11.6*  HCT 39.2 36.7 37.2  MCV 94.7 93.6 96.6  PLT 172 169 158   Cardiac Enzymes:  Recent Labs Lab 09/16/13 2158 09/17/13 0400 09/17/13 0948  TROPONINI <0.30 <0.30 <0.30   BNP: BNP (last 3 results) No results found for this basename: PROBNP,  in the last 8760 hours CBG:  Recent Labs Lab 09/16/13 2259  GLUCAP 85    Time coordinating discharge: 45 minutes  Signed:  Maycen Degregory  [facility] 09/18/2013, 3:39 PM

## 2013-09-18 NOTE — Progress Notes (Signed)
SUBJECTIVE: One episode of chest pain this am. None currently.   BP 131/72  Pulse 66  Temp(Src) 98.2 F (36.8 C) (Oral)  Resp 16  Ht 5' (1.524 m)  Wt 83 lb 6.4 oz (37.83 kg)  BMI 16.29 kg/m2  SpO2 97%  Intake/Output Summary (Last 24 hours) at 09/18/13 0706 Last data filed at 09/18/13 0600  Gross per 24 hour  Intake   2039 ml  Output    950 ml  Net   1089 ml    PHYSICAL EXAM General: Thin, cachectic female. No acute distress. Alert and oriented x 3.  Psych:  Good affect, responds appropriately Neck: No JVD. No masses noted.  Lungs: Clear bilaterally with no wheezes or rhonci noted.  Heart: RRR with systolic murmur noted. Abdomen: Bowel sounds are present. Soft, non-tender.  Extremities: No lower extremity edema.   LABS: Basic Metabolic Panel:  Recent Labs  40/98/1103/28/15 2158 09/17/13 0400 09/18/13 0356  NA  --  141 142  K  --  3.8 4.1  CL  --  104 103  CO2  --  31 29  GLUCOSE  --  83 98  BUN  --  10 16  CREATININE  --  0.81 1.06  CALCIUM  --  8.9 9.6  MG 2.1  --   --    CBC:  Recent Labs  09/17/13 0400 09/18/13 0356  WBC 6.2 6.9  HGB 11.9* 11.6*  HCT 36.7 37.2  MCV 93.6 96.6  PLT 169 158   Cardiac Enzymes:  Recent Labs  09/16/13 2158 09/17/13 0400 09/17/13 0948  TROPONINI <0.30 <0.30 <0.30   Fasting Lipid Panel:  Recent Labs  09/17/13 0400  CHOL 141  HDL 56  LDLCALC 73  TRIG 62  CHOLHDL 2.5    Current Meds: . aspirin  325 mg Oral q morning - 10a  . cefTRIAXone (ROCEPHIN)  IV  1 g Intravenous Q24H  . docusate sodium  100 mg Oral BID  . enoxaparin (LOVENOX) injection  30 mg Subcutaneous QHS  . feeding supplement (ENSURE COMPLETE)  237 mL Oral BID BM  . hydrochlorothiazide  12.5 mg Oral Daily  . influenza vac split quadrivalent PF  0.5 mL Intramuscular Tomorrow-1000  . latanoprost  1 drop Both Eyes QHS  . lisinopril  5 mg Oral Daily  . metoprolol tartrate  12.5 mg Oral BID  . mometasone-formoterol  2 puff Inhalation BID  .  nicotine  14 mg Transdermal Daily  . pantoprazole  40 mg Oral Daily  . tiotropium  18 mcg Inhalation Daily   Echo: Left ventricle: The cavity size was normal. Wall thickness was normal. Systolic function was normal. The estimated ejection fraction was in the range of 60% to 65%. Wall motion was normal; there were no regional wall motion abnormalities. The study is not technically sufficient to allow evaluation of LV diastolic function. - Aortic valve: Trileaflet; mildly thickened leaflets. There was no stenosis. No regurgitation. - Mitral valve: Mildly thickened leaflets . Trivial regurgitation. - Right ventricle: RV systolic pressure: 28mm Hg (S, est). - Tricuspid valve: Mild regurgitation. - Inferior vena cava: The vessel was normal in size; the respirophasic diameter changes were in the normal range (= 50%); findings are consistent with normal central venous pressure. - Pericardium, extracardiac: There was no pericardial effusion.    Principal Problem:   Chest pain Active Problems:   UTI (lower urinary tract infection)   Chronic constipation   COPD (chronic obstructive pulmonary disease)  Tobacco abuse   Anxiety   HTN (hypertension), malignant   Severe malnutrition   Glaucoma   Left arm pain   Murmur   ASSESSMENT AND PLAN:  1. Chest pain: No objective evidence of ischemia. Cardiac markers negative. EKG this am unchanged. Plans for stress myoview today. If no ischemia, can be discharged home this afternoon and f/u with Dr. Myrtis Ser. If stress test is abnormal, will need to plan cardiac cath.    2. Murmur: No significant valve disease on echo. Likely due to aortic valve sclerosis.    Amber Rowland  3/30/20157:06 AM

## 2013-09-18 NOTE — Care Management Note (Addendum)
    Page 1 of 1   09/18/2013     3:45:40 PM   CARE MANAGEMENT NOTE 09/18/2013  Patient:  [facility]NIS[facility] A   Account Number:  [id number]  Date Initiated:  09/18/2013  Documentation initiated by:  [facility],Tonya Wantz  Subjective/Objective Assessment:   80 Y/O F ADMITTED W/CHEST PAIN.     Action/Plan:   FROM HOME.HAS PCP,PHARMACY.   Anticipated DC Date:  09/18/2013   Anticipated DC Plan:  HOME/SELF CARE  In-house referral  PCP / [facility]      DC Planning Services  CM consult  Patient refused services      Choice offered to / List presented to:             Status of service:  Completed, signed off Medicare Important Message given?   (If response is "NO", the following Medicare IM given date fields will be blank) Date Medicare IM given:   Date Additional Medicare IM given:    Discharge Disposition:  HOME/SELF CARE  Per UR Regulation:  Reviewed for med. necessity/level of care/duration of stay  If discussed at Long Length of Stay Meetings, dates discussed:    Comments:  09/18/13 Donney Caraveo RN,BSN NCM [phone] PROVIDED W/PCP LISTING.SAYS HER DTR WILL CHOOSE A PCP,SHE LIKES [place].ENCOURAGED HHC BUT SHE PLEASANTLY DECLINES.NOTED [facility] FOLLOWING.WILL ACCEPT [employer]-PROVIDED HER WITH PAMPHLET. I CALLED [facility] [employer] [facility] [phone]-RICHARD,THEY WILL CALL PATIENT @ HOME.

## 2013-09-18 NOTE — Progress Notes (Signed)
INITIAL NUTRITION ASSESSMENT  DOCUMENTATION CODES Per approved criteria  -Severe  malnutrition in the context of social or environmental circumstances -Underweight  Pt meets criteria for severe MALNUTRITION in the context of social/environmental circumstance as evidenced by PO intake <75% for > 3 months likely r/t financial constraints, and severe muscle wasting and subcutaneous fat loss.   INTERVENTION: -Recommend Ensure Complete po BID, each supplement provides 350 kcal and 13 grams of protein -Recommend Regular diet advancement -Will place 2pm and 8pm nourishment orders w/diet advancement -Encouraged intake of small, frequent high kcal/protein snacks -Provided pt with Ensure and Boost coupons -Will continue to monitor  NUTRITION DIAGNOSIS: Increased nutrient needs (protein/kacl) related to increased demand for nutrients  as evidenced by BMI <18.5, 20 lbs unintentional wt loss.   Goal: Pt to meet >/= 90% of their estimated nutrition needs    Monitor:  Diet order, total protein/energy intake, labs, weights  Reason for Assessment: MST/Consult/Underweight BMI  80 y.o. female  Admitting Dx: Chest pain  ASSESSMENT: Amber Rowland is a 80 y.o. female  With history of hypertension, extensive ongoing tobacco dependence, hypertension, family history of coronary artery disease in his son at age 78, history of glaucoma presented to the ED with left upper extremity pain and chest tightness.   -Pt reported an unintentional weight loss of 20 lbs in past two years -Has had feelings of early satiety post gastric surgery and ovary surgery that occurred in 2013 -Diet recall indicates pt consumes frequent snacks during the day-oatmeal/grits, peanut butter and crackers, pinto/kidney beans, half tuna/chicken salad sandwiches -Discussed ways to increase kcal/protein of snacks-using peanut butter and milk in preparation of hot cereaks, adding extra butter in grits and mashed  potatoes -Reported feelings of abd pain and cramping if she "overdoes it" -MD noted pt with hx of chronic constipation, which also likely decreases appetite. Receiving stool softener -Denied any dysphagia, does not normally eat meats/animal proteins -Denied any recent changes in appetite -Was on Ensure supplements during previous admit, but does not drink them at home d/t financial constraints. Provided pt with coupons; however pt with still unsure if she would be able to afford supplements. Encouraged use of [employer] Instant Breakfast as that is often cheaper supplement -Ate 50% of dinner yesterday, currently NPO for Lexiscan -Will add supplement and snack as diet advancement tolerated -Case Manager noted that pt is eligible for Meals on Wheel program to assist in meeting estimated nutrition needs  Nutrition Focused Physical Exam:  Subcutaneous Fat:  Orbital Region: WDL Upper Arm Region: severe Thoracic and Lumbar Region: n/a  Muscle:  Temple Region: moderate Clavicle Bone Region: severe Clavicle and Acromion Bone Region: severe Scapular Bone Region: n/a Dorsal Hand: moderate Patellar Region: severe Anterior Thigh Region: severe Posterior Calf Region: severe  Edema: none    Height: Ht Readings from Last 1 Encounters:  09/16/13 5' (1.524 m)    Weight: Wt Readings from Last 1 Encounters:  09/18/13 83 lb 6.4 oz (37.83 kg)    Ideal Body Weight: 100%  % Ideal Body Weight: 83%  Wt Readings from Last 10 Encounters:  09/18/13 83 lb 6.4 oz (37.83 kg)  06/10/12 82 lb 3.2 oz (37.286 kg)  05/09/12 78 lb 12.8 oz (35.743 kg)  05/09/12 78 lb 12.8 oz (35.743 kg)  05/09/12 78 lb 12.8 oz (35.743 kg)    Usual Body Weight: 110 lbs  % Usual Body Weight: 75%  BMI:  Body mass index is 16.29 kg/(m^2). Underweight  Estimated Nutritional Needs: Kcal: 1200-1400  Protein: 55-65 gram Fluid: >/=1400 ml/daily  Skin: WDL-bruising on left arm from injury  Diet Order:  NPO  EDUCATION NEEDS: -Education needs addressed   Intake/Output Summary (Last 24 hours) at 09/18/13 0933 Last data filed at 09/18/13 0600  Gross per 24 hour  Intake   2039 ml  Output    950 ml  Net   1089 ml    Last BM: 3/27   Labs:   Recent Labs Lab 09/16/13 1835 09/16/13 2158 09/17/13 0400 09/18/13 0356  NA 137  --  141 142  K 4.1  --  3.8 4.1  CL 95*  --  104 103  CO2 32  --  31 29  BUN 12  --  10 16  CREATININE 0.88  --  0.81 1.06  CALCIUM 9.9  --  8.9 9.6  MG  --  2.1  --   --   GLUCOSE 100*  --  83 98    CBG (last 3)   Recent Labs  09/16/13 2259  GLUCAP 85    Scheduled Meds: . aspirin  325 mg Oral q morning - 10a  . cefTRIAXone (ROCEPHIN)  IV  1 g Intravenous Q24H  . docusate sodium  100 mg Oral BID  . enoxaparin (LOVENOX) injection  30 mg Subcutaneous QHS  . feeding supplement (ENSURE COMPLETE)  237 mL Oral BID BM  . hydrochlorothiazide  12.5 mg Oral Daily  . influenza vac split quadrivalent PF  0.5 mL Intramuscular Tomorrow-1000  . latanoprost  1 drop Both Eyes QHS  . lisinopril  5 mg Oral Daily  . metoprolol tartrate  12.5 mg Oral BID  . mometasone-formoterol  2 puff Inhalation BID  . nicotine  14 mg Transdermal Daily  . pantoprazole  40 mg Oral Daily  . tiotropium  18 mcg Inhalation Daily    Continuous Infusions: . sodium chloride 1,000 mL (09/18/13 0020)    Past Medical History  Diagnosis Date  . Glaucoma (increased eye pressure)   . Ovarian cyst, right 2009    s/p BSO 2009: OVARIAN FIBROTHECOMA in multiple fluid filled cysts  4.5cm region  . Atrophy of left kidney     Chronic atrophy of the left kidney.  . Chronic constipation   . COPD (chronic obstructive pulmonary disease)     Hyperinflation and heavy smoking history strongly  . HTN (hypertension) 04/12/2012  . Benign breast cysts in female 04/12/2012    Seen on mammography 2009   . UTI (lower urinary tract infection)     History of chronic recurrent UTIs  . Malnutrition  04/28/2012  . Ileus following gastrointestinal surgery 04/28/2012  . SBO (small bowel obstruction)   . Severe malnutrition 09/16/2013    Past Surgical History  Procedure Laterality Date  . Bilateral oophorectomy  2009    PROCEDURE:  diagnostic laparoscopy, laparotomy with bilateral salpingo-  . Laparotomy  04/23/2012    Procedure: EXPLORATORY LAPAROTOMY;  Surgeon: Valarie Merino, MD;  Location: [facility];  Service: General;  Laterality: N/A;  enterolysis     Lloyd Huger MS RD LDN Clinical Dietitian Pager:[phone]

## 2013-09-18 NOTE — Progress Notes (Signed)
Patient complaining of 8 out of 10 CP this morning. No SOB. Gave patient an aspirin and did an EKG. NP notified and made aware that patient is going for a lexiscan this morning. Will continue to monitor patient.

## 2013-09-19 LAB — URINE CULTURE: Colony Count: >=100000

## 2013-10-13 ENCOUNTER — Encounter: Payer: Self-pay | Admitting: Cardiology

## 2013-10-13 ENCOUNTER — Ambulatory Visit: Payer: Medicare Other | Admitting: Cardiology

## 2013-10-13 DIAGNOSIS — R9430 Abnormal result of cardiovascular function study, unspecified: Secondary | ICD-10-CM | POA: Insufficient documentation

## 2013-11-30 ENCOUNTER — Encounter: Payer: Self-pay | Admitting: Cardiology

## 2014-01-04 IMAGING — CR DG ABDOMEN ACUTE W/ 1V CHEST
3 series · 3 of 3 positions shown · non-contrast
Comparison: Abdominal CT 11/05/2007 and chest radiograph 01/16/2008

CLINICAL DATA: Nausea, vomiting and upper abdominal pain.

ACUTE ABDOMEN SERIES (ABDOMEN 2 VIEW & CHEST 1 VIEW)

[w abdomen decub]
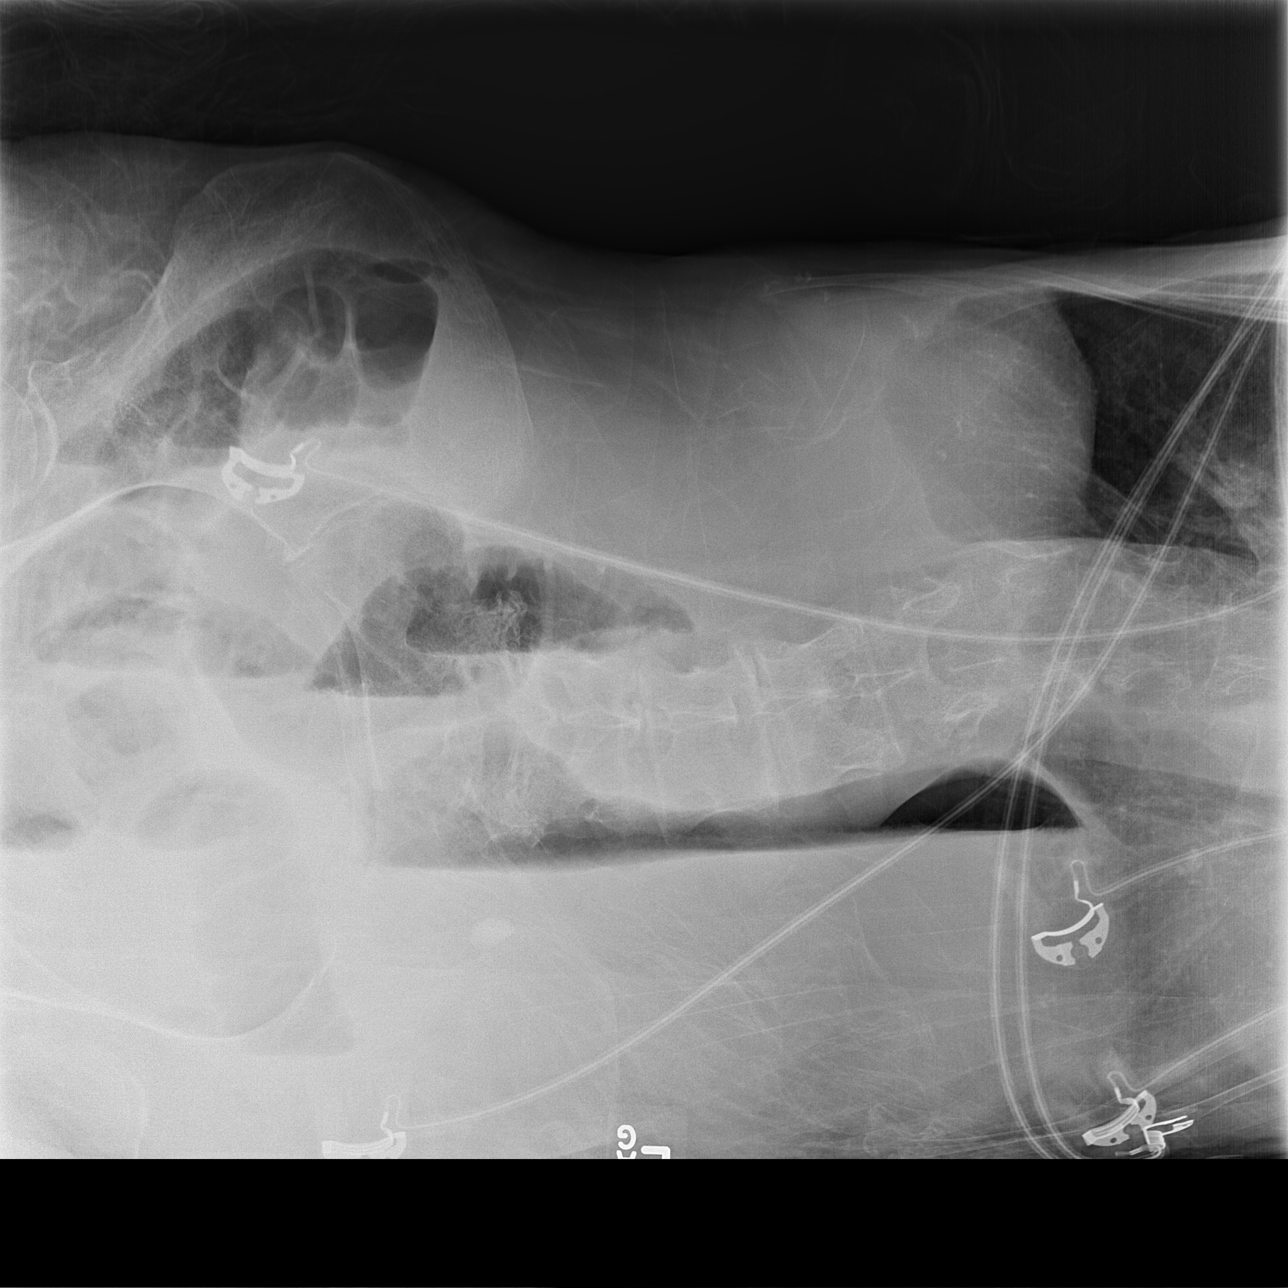

[x abdomen supine]
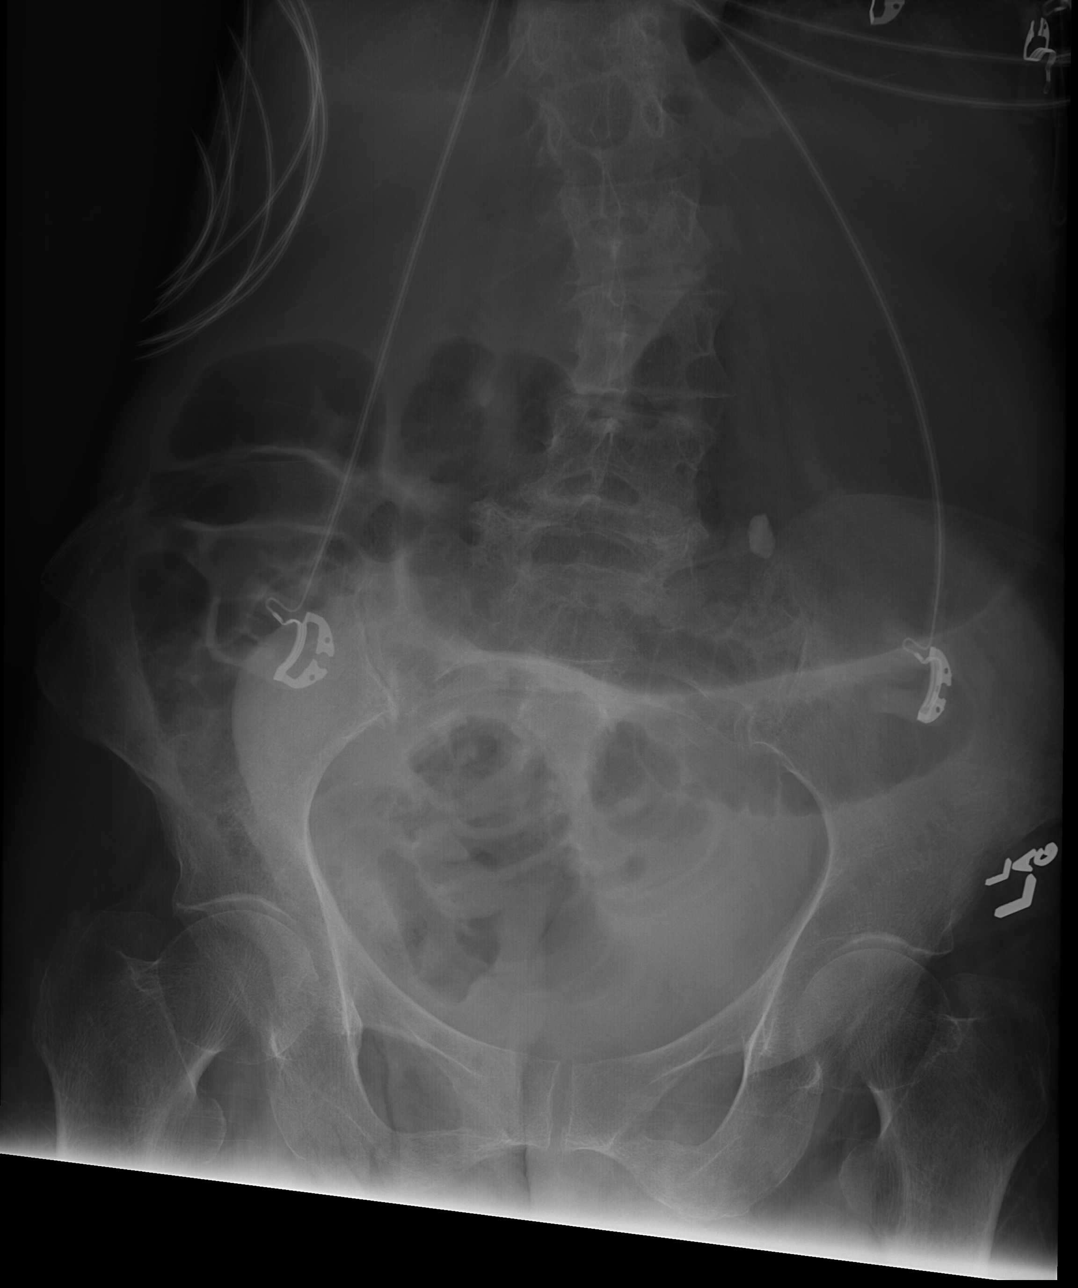

[x chest ap]
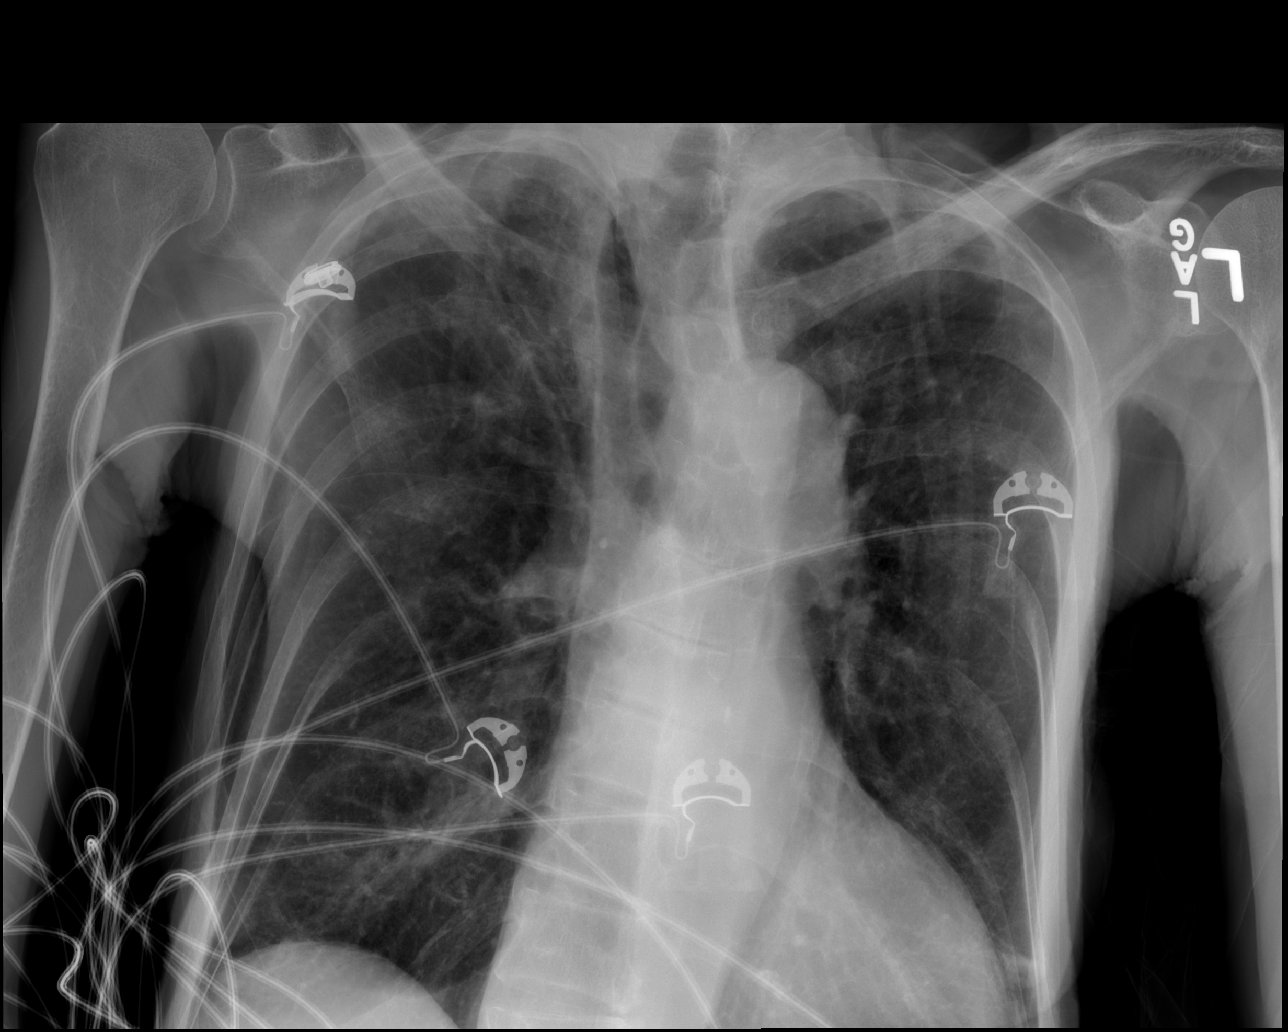

[3 of 3 positions shown; findings below may reference images not displayed]

FINDINGS: Chest radiograph again demonstrates hyperinflation.
Stable appearance of the heart.  Chronic densities in the medial
right lung base are poorly characterized on this exam due to
overlying cardiac leads.  Similar densities were present on the
prior examination.

There is a large amount of lucency in the left upper quadrant of
the abdomen, suggesting a distended stomach.  There appears to be
dilated loops of small bowel in the pelvis.  No evidence of free
air on the left lateral decubitus study. There may be some mass
effect on the bowel loops in the right lower quadrant.
IMPRESSION: There appears be marked distention of the stomach with small bowel
dilatation.  Findings are concerning for a bowel obstruction.

Stable hyperinflation.  Again noted are chronic densities along the
medial right lower lung which are poorly characterized on this
examination due to overlying cardiac leads.

## 2014-01-04 IMAGING — CT CT ABD-PELV W/ CM
1 of 3 series · 15 of 30 positions shown, 19 images · IV contrast (100 ML OMNI 300)
Comparison: 11/05/2007

CLINICAL DATA: Abdominal pain

CT ABDOMEN AND PELVIS WITH CONTRAST
TECHNIQUE: Multidetector CT imaging of the abdomen and pelvis was
performed following the standard protocol during bolus
administration of intravenous contrast.
Contrast: 100mL OMNIPAQUE IOHEXOL 300 MG/ML  SOLN

[Series 2: c/a/p with · axial · 0.62mm/px · z∈[+1165,+1630]mm · 15 of 109 slices shown, 19 images]
[im 8/109  mediastinal]
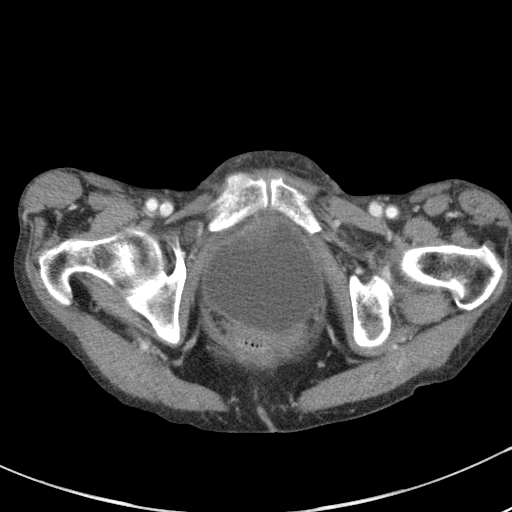
[im 8/109  lung]
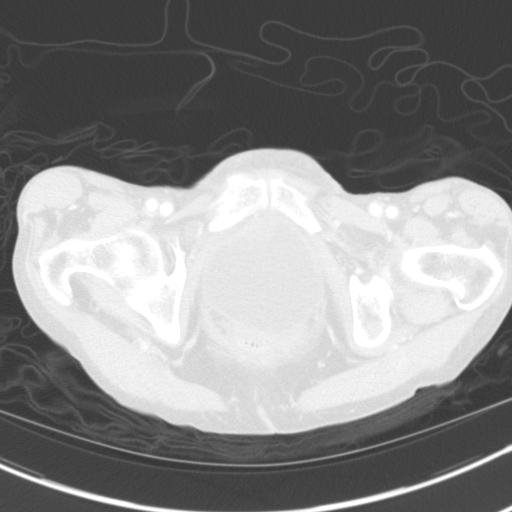
[im 15/109  lung]
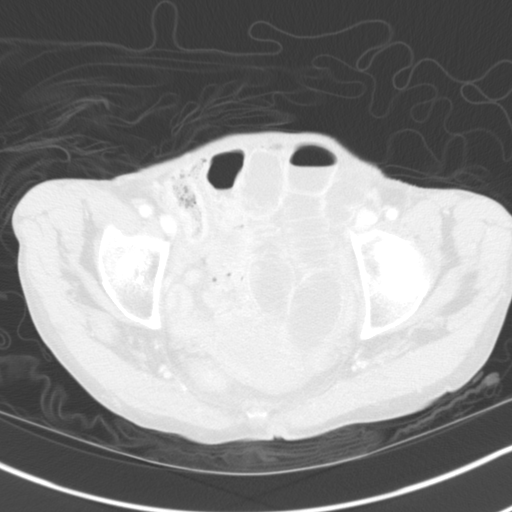
[im 22/109  lung]
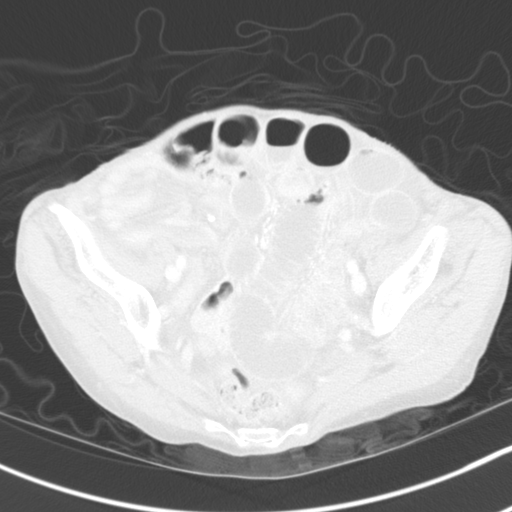
[im 29/109  lung]
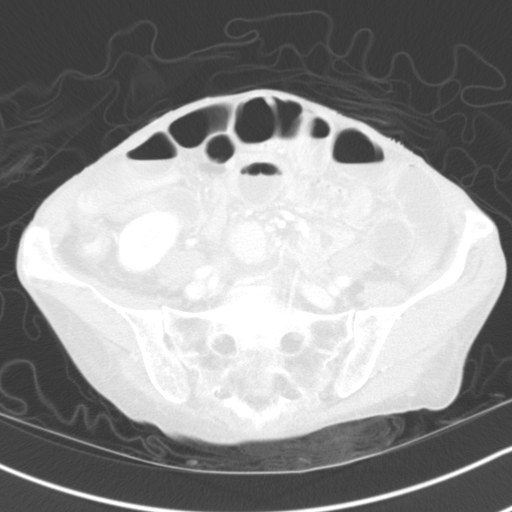
[im 37/109  mediastinal]
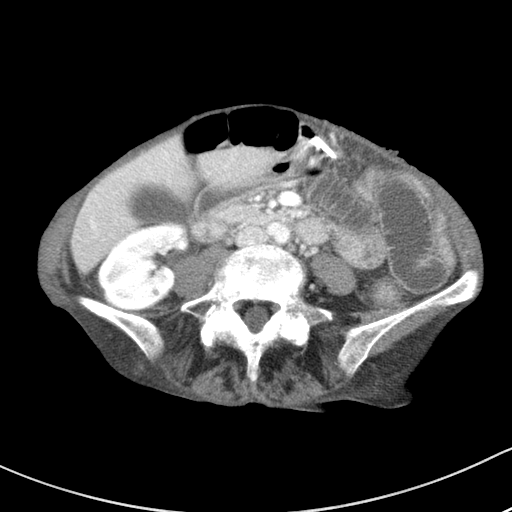
[im 37/109  lung]
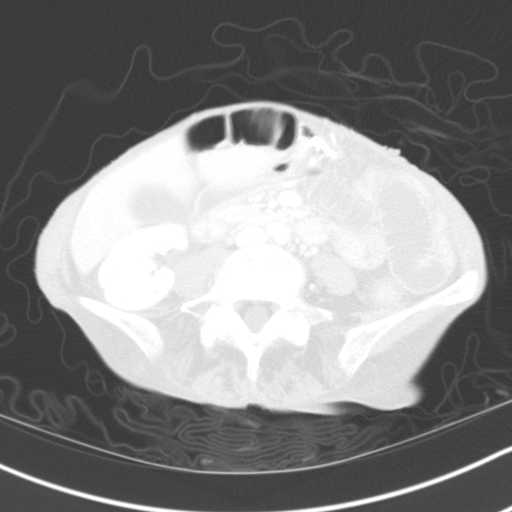
[im 44/109  lung]
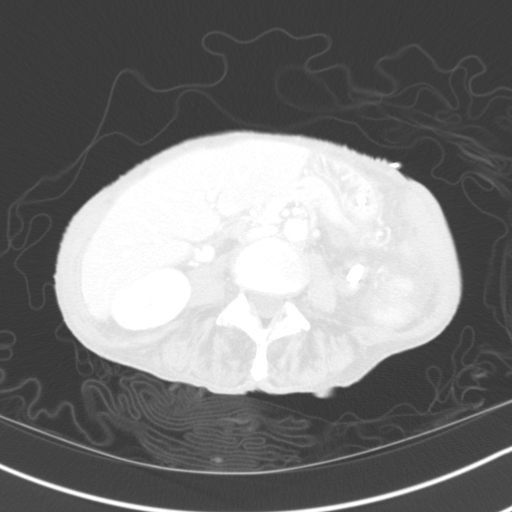
[im 51/109  lung]
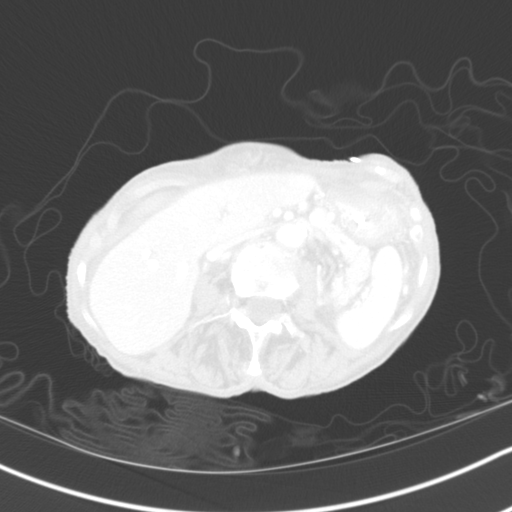
[im 55/109  lung]
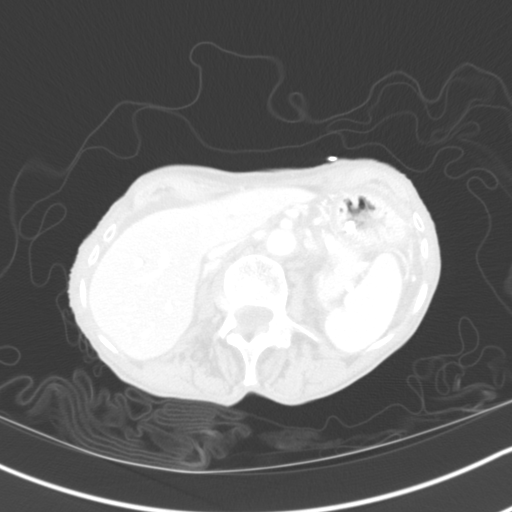
[im 58/109  mediastinal]
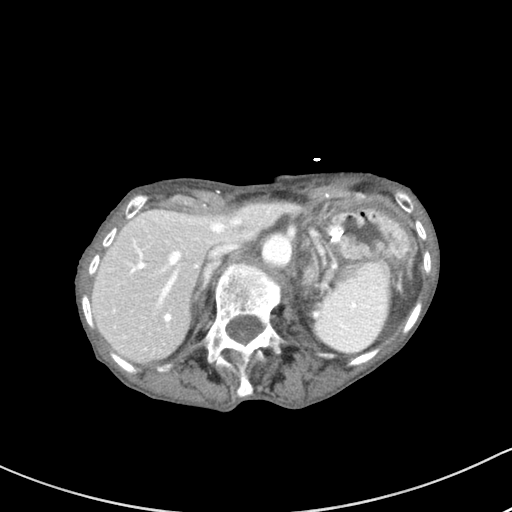
[im 58/109  lung]
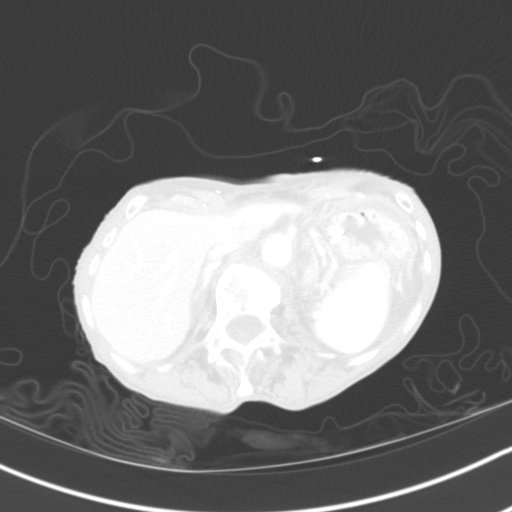
[im 65/109  lung]
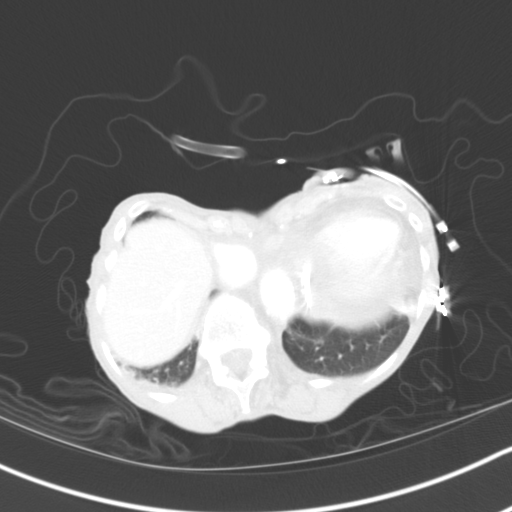
[im 73/109  lung]
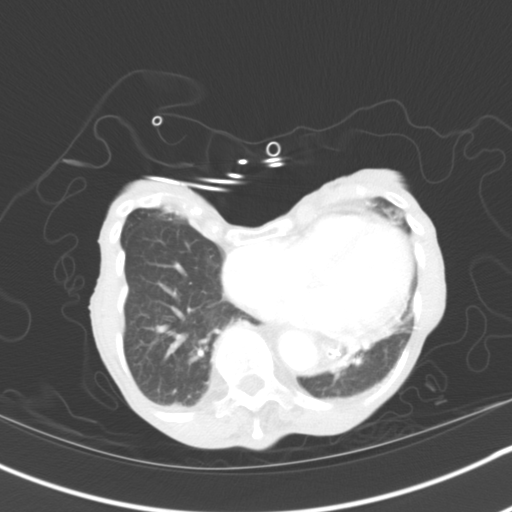
[im 80/109  lung]
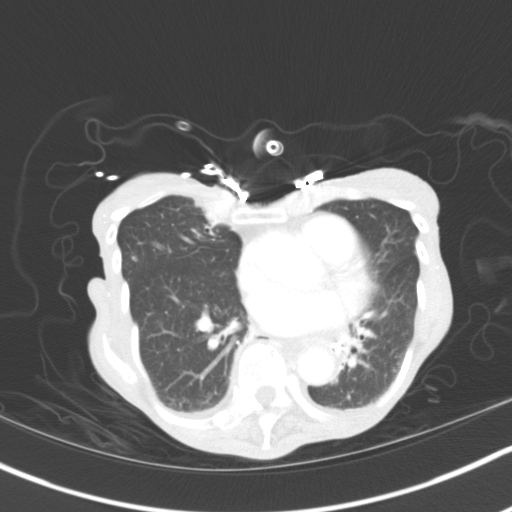
[im 87/109  mediastinal]
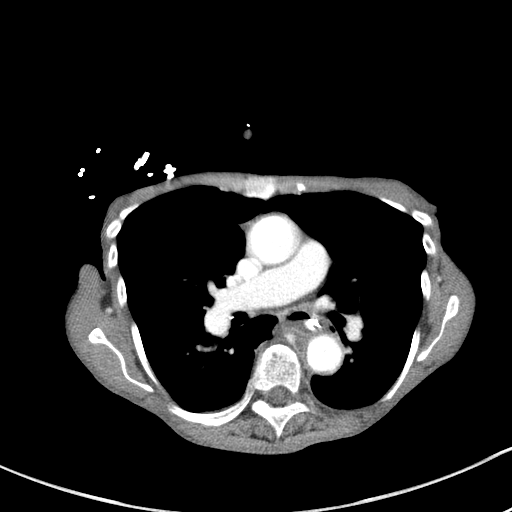
[im 87/109  lung]
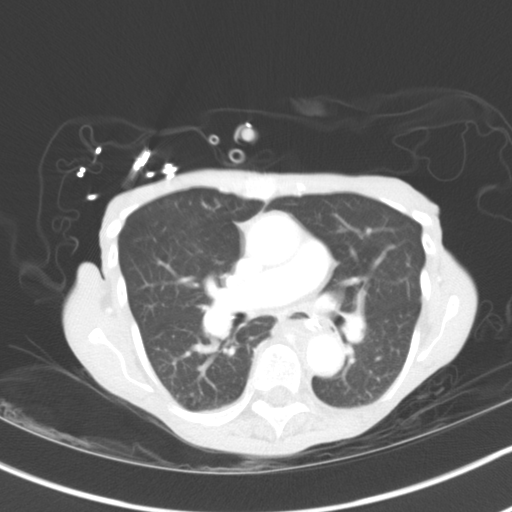
[im 94/109  lung]
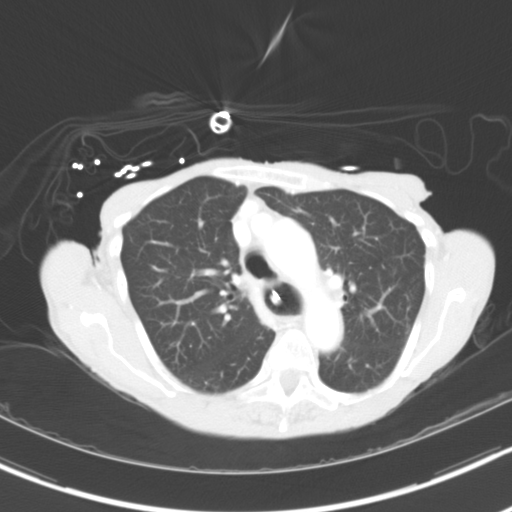
[im 101/109  lung]
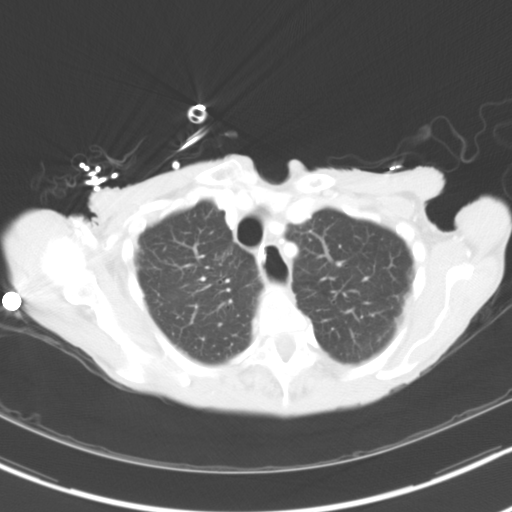

[15 of 30 positions shown; findings below may reference images not displayed]

FINDINGS: See separate chest CT report.  Unremarkable liver,
biliary system, spleen, pancreas, adrenal glands.  Atrophic left
kidney containing a large calcification.  Hypodensity arising from
the lower pole right kidney is favored to be a cyst.  Otherwise,
homogeneous enhancement.  No hydronephrosis.  Unable to follow
ureteral course.

Large bowel is relatively decompressed as are distal small bowel
loops.  Proximal small bowel loops are dilated up to 3.5 cm with
air-fluid levels.  There is free intraperitoneal fluid.  No free
intraperitoneal air.  Minimal intraperitoneal fat limits evaluation
for adenopathy.

Thin-walled bladder.  Nonspecific appearance to the uterus.

Scattered atherosclerosis of the aorta and branch vessels.  No
aneurysmal dilatation.

Multilevel degenerative changes.  Curvature of the spine.  No acute
osseous finding.
IMPRESSION: Small bowel obstruction.  Difficult to localize exact transition
point however distal small bowel loops are entirely decompressed.

There is a small amount ascites which is nonspecific and may be
reactive to the obstructive process.

## 2014-04-12 DIAGNOSIS — H4011X2 Primary open-angle glaucoma, moderate stage: Secondary | ICD-10-CM | POA: Diagnosis not present

## 2014-04-19 ENCOUNTER — Observation Stay (HOSPITAL_COMMUNITY)
Admission: EM | Admit: 2014-04-19 | Discharge: 2014-04-20 | Disposition: A | Discharge status: Home / Self Care | Payer: Medicare Other | Attending: Internal Medicine | Admitting: Internal Medicine

## 2014-04-19 ENCOUNTER — Emergency Department (HOSPITAL_COMMUNITY): Payer: Medicare Other

## 2014-04-19 ENCOUNTER — Observation Stay (HOSPITAL_COMMUNITY): Payer: Medicare Other

## 2014-04-19 ENCOUNTER — Encounter (HOSPITAL_COMMUNITY): Payer: Self-pay | Admitting: Emergency Medicine

## 2014-04-19 DIAGNOSIS — H409 Unspecified glaucoma: Secondary | ICD-10-CM | POA: Insufficient documentation

## 2014-04-19 DIAGNOSIS — I10 Essential (primary) hypertension: Secondary | ICD-10-CM

## 2014-04-19 DIAGNOSIS — R52 Pain, unspecified: Secondary | ICD-10-CM

## 2014-04-19 DIAGNOSIS — Z87891 Personal history of nicotine dependence: Secondary | ICD-10-CM | POA: Diagnosis not present

## 2014-04-19 DIAGNOSIS — K5909 Other constipation: Secondary | ICD-10-CM

## 2014-04-19 DIAGNOSIS — M79602 Pain in left arm: Principal | ICD-10-CM

## 2014-04-19 DIAGNOSIS — J439 Emphysema, unspecified: Secondary | ICD-10-CM

## 2014-04-19 DIAGNOSIS — M542 Cervicalgia: Secondary | ICD-10-CM

## 2014-04-19 DIAGNOSIS — J449 Chronic obstructive pulmonary disease, unspecified: Secondary | ICD-10-CM | POA: Diagnosis present

## 2014-04-19 DIAGNOSIS — M79603 Pain in arm, unspecified: Secondary | ICD-10-CM | POA: Diagnosis present

## 2014-04-19 DIAGNOSIS — R079 Chest pain, unspecified: Secondary | ICD-10-CM

## 2014-04-19 DIAGNOSIS — K5900 Constipation, unspecified: Secondary | ICD-10-CM | POA: Diagnosis not present

## 2014-04-19 DIAGNOSIS — Z7982 Long term (current) use of aspirin: Secondary | ICD-10-CM | POA: Diagnosis not present

## 2014-04-19 DIAGNOSIS — M5032 Other cervical disc degeneration, mid-cervical region: Secondary | ICD-10-CM | POA: Diagnosis not present

## 2014-04-19 DIAGNOSIS — J438 Other emphysema: Secondary | ICD-10-CM

## 2014-04-19 DIAGNOSIS — R0602 Shortness of breath: Secondary | ICD-10-CM | POA: Diagnosis not present

## 2014-04-19 LAB — I-STAT TROPONIN, ED: Troponin i, poc: 0.01 ng/mL (ref 0.00–0.08)

## 2014-04-19 LAB — I-STAT CHEM 8, ED
BUN: 7 mg/dL (ref 6–23)
CHLORIDE: 100 meq/L (ref 96–112)
Calcium, Ion: 1.24 mmol/L (ref 1.13–1.30)
Creatinine, Ser: 1 mg/dL (ref 0.50–1.10)
GLUCOSE: 92 mg/dL (ref 70–99)
HCT: 38 % (ref 36.0–46.0)
Hemoglobin: 12.9 g/dL (ref 12.0–15.0)
Potassium: 4.5 mEq/L (ref 3.7–5.3)
Sodium: 136 mEq/L — ABNORMAL LOW (ref 137–147)
TCO2: 26 mmol/L (ref 0–100)

## 2014-04-19 LAB — CBC
HCT: 38.2 % (ref 36.0–46.0)
HEMATOCRIT: 37.4 % (ref 36.0–46.0)
Hemoglobin: 12.4 g/dL (ref 12.0–15.0)
Hemoglobin: 12 g/dL (ref 12.0–15.0)
MCH: 30 pg (ref 26.0–34.0)
MCH: 29.9 pg (ref 26.0–34.0)
MCHC: 32.5 g/dL (ref 30.0–36.0)
MCHC: 32.1 g/dL (ref 30.0–36.0)
MCV: 92.3 fL (ref 78.0–100.0)
MCV: 93 fL (ref 78.0–100.0)
PLATELETS: 191 10*3/uL (ref 150–400)
Platelets: 174 10*3/uL (ref 150–400)
RBC: 4.14 MIL/uL (ref 3.87–5.11)
RBC: 4.02 MIL/uL (ref 3.87–5.11)
RDW: 13.2 % (ref 11.5–15.5)
RDW: 13.1 % (ref 11.5–15.5)
WBC: 5.8 10*3/uL (ref 4.0–10.5)
WBC: 5.9 10*3/uL (ref 4.0–10.5)

## 2014-04-19 LAB — TROPONIN I: Troponin I: <0.3 ng/mL (ref <0.30)

## 2014-04-19 LAB — CREATININE, SERUM
Creatinine, Ser: 1.1 mg/dL (ref 0.50–1.10)
GFR calc Af Amer: 54 mL/min — ABNORMAL LOW (ref >90)
GFR, EST NON AFRICAN AMERICAN: 47 mL/min — AB (ref >90)

## 2014-04-19 MED ORDER — ASPIRIN EC 325 MG PO TBEC
325.0000 mg | DELAYED_RELEASE_TABLET | Freq: Every day | ORAL | Status: DC
  Administered 2014-04-20: 325 mg via ORAL
  Filled 2014-04-19: qty 1

## 2014-04-19 MED ORDER — ONDANSETRON HCL 4 MG/2ML IJ SOLN: 4.0000 mg | Freq: Four times a day (QID) | INTRAMUSCULAR | Status: DC | PRN

## 2014-04-19 MED ORDER — VITAMIN B-12 1000 MCG PO TABS
1000.0000 ug | ORAL_TABLET | Freq: Every day | ORAL | Status: DC
  Administered 2014-04-20: 1000 ug via ORAL
  Filled 2014-04-19: qty 1

## 2014-04-19 MED ORDER — HYDROCHLOROTHIAZIDE 12.5 MG PO CAPS
12.5000 mg | ORAL_CAPSULE | Freq: Every day | ORAL | Status: DC
  Administered 2014-04-19 – 2014-04-20 (×2): 12.5 mg via ORAL
  Filled 2014-04-19 (×2): qty 1

## 2014-04-19 MED ORDER — POLYETHYLENE GLYCOL 3350 17 G PO PACK
17.0000 g | PACK | Freq: Every day | ORAL | Status: DC | PRN
  Filled 2014-04-19: qty 1

## 2014-04-19 MED ORDER — ACETAMINOPHEN 650 MG RE SUPP: 650.0000 mg | Freq: Four times a day (QID) | RECTAL | Status: DC | PRN

## 2014-04-19 MED ORDER — ALBUTEROL SULFATE (2.5 MG/3ML) 0.083% IN NEBU
2.5000 mg | INHALATION_SOLUTION | Freq: Four times a day (QID) | RESPIRATORY_TRACT | Status: DC
  Administered 2014-04-19: 2.5 mg via RESPIRATORY_TRACT

## 2014-04-19 MED ORDER — ACETAMINOPHEN 325 MG PO TABS: 650.0000 mg | ORAL_TABLET | Freq: Four times a day (QID) | ORAL | Status: DC | PRN

## 2014-04-19 MED ORDER — ENOXAPARIN SODIUM 40 MG/0.4ML ~~LOC~~ SOLN
40.0000 mg | Freq: Every day | SUBCUTANEOUS | Status: DC
  Administered 2014-04-19: 40 mg via SUBCUTANEOUS
  Filled 2014-04-19 (×2): qty 0.4

## 2014-04-19 MED ORDER — SODIUM CHLORIDE 0.9 % IJ SOLN: 3.0000 mL | Freq: Two times a day (BID) | INTRAMUSCULAR | Status: DC

## 2014-04-19 MED ORDER — SODIUM CHLORIDE 0.9 % IJ SOLN
3.0000 mL | Freq: Two times a day (BID) | INTRAMUSCULAR | Status: DC
  Administered 2014-04-19 – 2014-04-20 (×2): 3 mL via INTRAVENOUS

## 2014-04-19 MED ORDER — INFLUENZA VAC SPLIT QUAD 0.5 ML IM SUSY
0.5000 mL | PREFILLED_SYRINGE | INTRAMUSCULAR | Status: AC
  Administered 2014-04-20: 0.5 mL via INTRAMUSCULAR
  Filled 2014-04-19 (×2): qty 0.5

## 2014-04-19 MED ORDER — ASPIRIN 81 MG PO CHEW
324.0000 mg | CHEWABLE_TABLET | Freq: Once | ORAL | Status: AC
  Administered 2014-04-19: 324 mg via ORAL
  Filled 2014-04-19: qty 4

## 2014-04-19 MED ORDER — NITROGLYCERIN 0.4 MG SL SUBL
0.4000 mg | SUBLINGUAL_TABLET | SUBLINGUAL | Status: AC | PRN
  Administered 2014-04-19 (×3): 0.4 mg via SUBLINGUAL
  Filled 2014-04-19: qty 1

## 2014-04-19 MED ORDER — ALBUTEROL SULFATE (2.5 MG/3ML) 0.083% IN NEBU
2.5000 mg | INHALATION_SOLUTION | RESPIRATORY_TRACT | Status: DC | PRN
  Filled 2014-04-19: qty 3

## 2014-04-19 MED ORDER — HYDRALAZINE HCL 20 MG/ML IJ SOLN: 10.0000 mg | INTRAMUSCULAR | Status: DC | PRN

## 2014-04-19 MED ORDER — ONDANSETRON HCL 4 MG PO TABS: 4.0000 mg | ORAL_TABLET | Freq: Four times a day (QID) | ORAL | Status: DC | PRN

## 2014-04-19 MED ORDER — ASPIRIN 325 MG PO TABS: 325.0000 mg | ORAL_TABLET | Freq: Every morning | ORAL | Status: DC

## 2014-04-19 MED ORDER — LATANOPROST 0.005 % OP SOLN
1.0000 [drp] | Freq: Every day | OPHTHALMIC | Status: DC
  Filled 2014-04-19: qty 2.5

## 2014-04-19 NOTE — ED Notes (Signed)
Pt c/o left arm pain that started this morning after she ate breakfast. Pt took aspirin but didn't help the pain.

## 2014-04-19 NOTE — ED Notes (Signed)
Patient back from radiology and remains in NAD 

## 2014-04-19 NOTE — ED Notes (Signed)
Patient continues to rate left shoulder pain 3/10 after 3rd SL NTG tab, see MAR Dr. Gwendolyn GrantWalden aware VS updated and stable Patient remains in NAD Side rails up, call bell in reach and family at bedside

## 2014-04-19 NOTE — ED Provider Notes (Signed)
CSN: [id number]     Arrival date & time 04/19/14  1631 History   First MD Initiated Contact with Patient 04/19/14 1741     Chief Complaint  Patient presents with  . left arm pain      (Consider location/radiation/quality/duration/timing/severity/associated sxs/prior Treatment) Patient is a 80 y.o. female presenting with general illness. The history is provided by the patient.  Illness Location:  Left arm pain Quality:  Aching Severity:  Moderate Onset quality:  Gradual Timing:  Constant Progression:  Unchanged Chronicity:  New Context:  Spontaneously Relieved by:  Nothing Worsened by:  Nothing Ineffective treatments:  Aspirin Associated symptoms: shortness of breath   Associated symptoms: no abdominal pain, no chest pain, no congestion, no cough, no fever, no rash, no rhinorrhea and no vomiting     Past Medical History  Diagnosis Date  . Glaucoma (increased eye pressure)   . Ovarian cyst, right 2009    s/p BSO 2009: OVARIAN FIBROTHECOMA in multiple fluid filled cysts  4.5cm region  . Atrophy of left kidney     Chronic atrophy of the left kidney.  . Chronic constipation   . COPD (chronic obstructive pulmonary disease)     Hyperinflation and heavy smoking history strongly  . HTN (hypertension) 04/12/2012  . Benign breast cysts in female 04/12/2012    Seen on mammography 2009   . UTI (lower urinary tract infection)     History of chronic recurrent UTIs  . Malnutrition 04/28/2012  . Ileus following gastrointestinal surgery 04/28/2012  . SBO (small bowel obstruction)   . Severe malnutrition 09/16/2013  . Ejection fraction    Past Surgical History  Procedure Laterality Date  . Bilateral oophorectomy  2009    PROCEDURE:  diagnostic laparoscopy, laparotomy with bilateral salpingo-  . Laparotomy  04/23/2012    Procedure: EXPLORATORY LAPAROTOMY;  Surgeon: Valarie MerinoMatthew B Martin, MD;  Location: [facility];  Service: General;  Laterality: N/A;  enterolysis    Family History  Problem  Relation Age of Onset  . Diabetes Son   . High blood pressure Son   . Coronary artery disease Son    History  Substance Use Topics  . Smoking status: Former Smoker -- 0.80 packs/day for 60 years    Types: Cigarettes  . Smokeless tobacco: Never Used  . Alcohol Use: No   OB History   Grav Para Term Preterm Abortions TAB SAB Ect Mult Living                 Review of Systems  Constitutional: Negative for fever.  HENT: Negative for congestion and rhinorrhea.   Respiratory: Positive for shortness of breath. Negative for cough.   Cardiovascular: Negative for chest pain.  Gastrointestinal: Negative for vomiting and abdominal pain.  Skin: Negative for rash.  All other systems reviewed and are negative.     Allergies  Review of patient's allergies indicates no known allergies.  Home Medications   Prior to Admission medications   Medication Sig Start Date End Date Taking? Authorizing Provider  aspirin 325 MG tablet Take 325 mg by mouth every morning.    Yes Historical Provider, MD  polyethylene glycol (MIRALAX / GLYCOLAX) packet Take 17 g by mouth daily as needed for mild constipation (constipation).   Yes Historical Provider, MD  Travoprost, BAK Free, (TRAVATAN) 0.004 % SOLN ophthalmic solution Place 1 drop into both eyes at bedtime.    Yes Historical Provider, MD  vitamin B-12 1000 MCG tablet Take 1 tablet (1,000 mcg total) by mouth  daily. 09/18/13  Yes Renae FickleMackenzie Short, MD   BP 172/88  Pulse 67  Temp(Src) 97.4 F (36.3 C) (Oral)  Resp 16  SpO2 100% Physical Exam  Nursing note and vitals reviewed. Constitutional: She is oriented to person, place, and time. She appears well-developed and well-nourished. No distress.  HENT:  Head: Normocephalic and atraumatic.  Mouth/Throat: Oropharynx is clear and moist.  Eyes: EOM are normal. Pupils are equal, round, and reactive to light.  Neck: Normal range of motion. Neck supple.  Cardiovascular: Normal rate and regular rhythm.  Exam  reveals no friction rub.   No murmur heard. Pulmonary/Chest: Effort normal and breath sounds normal. No respiratory distress. She has no wheezes. She has no rales.  Abdominal: Soft. She exhibits no distension. There is no tenderness. There is no rebound.  Musculoskeletal: Normal range of motion. She exhibits no edema.  Neurological: She is alert and oriented to person, place, and time. No cranial nerve deficit. She exhibits normal muscle tone. Coordination normal.  Skin: No rash noted. She is not diaphoretic.    ED Course  Procedures (including critical care time) Labs Review Labs Reviewed  CBC  Rosezena SensorI-STAT TROPOININ, ED    Imaging Review Dg Chest 2 View  04/19/2014   CLINICAL DATA:  Left arm pain, COPD  EXAM: CHEST  2 VIEW  COMPARISON:  09/16/2013  FINDINGS: Chronic interstitial markings/ emphysematous changes. No pleural effusion or pneumothorax.  The heart is normal in size.  Degenerative changes of the thoracic spine with levoscoliosis.  IMPRESSION: No evidence of acute cardiopulmonary disease.   Electronically Signed   By: Charline BillsSriyesh  Krishnan M.D.   On: 04/19/2014 19:12     EKG Interpretation   Date/Time:  Thursday April 19 2014 17:08:19 EDT Ventricular Rate:  59 PR Interval:  162 QRS Duration: 92 QT Interval:  433 QTC Calculation: 429 R Axis:   -38 Text Interpretation:  Sinus rhythm Probable left atrial enlargement Left  ventricular hypertrophy Similar to prior Confirmed by Gwendolyn GrantWALDEN  MD, Zulema Pulaski  ([id number]) on 04/19/2014 5:44:04 PM      MDM   Final diagnoses:  Chest pain    51F with hx of COPD presents with L arm pain. Admitted for this 7 months ago, had normal Echo. Cards note reported she was to get a Myoview, however I cannot find a stress test report. Similar to prior episode where she was admitted. No relief with aspirin. No trauma. Exam benign.  EKG similar to prior. Will have patient admitted for ACS r/o.    Elwin MochaBlair Tylesha Gibeault, MD 04/19/14 [phone]

## 2014-04-19 NOTE — H&P (Signed)
[facility] History and Physical  Amber Peatlizabeth A Cullom WUJ:[id number] DOB: 04/06/1934 DOA: 04/19/2014  Referring physician: ER physician. PCP: No primary provider on file.   Chief Complaint: Left arm pain.  HPI: Amber Rowland is a 80 y.o. female with history of COPD and hypertension and previous history of tobacco abuse presents to the ER because of persistent left arm pain since morning. Patient states that pain started off morning which is aching pain and associated with mild shortness of breath. Denies any chest pain fever chills or cough or any productive sputum. Pain was improved with sublingual nitroglycerin in the ER and patient has been admitted to further workup to rule out ACS. Patient had similar presentation in March of this year and at that time cardiac stress test was negative for any ischemia as per the discharge summary. 2-D echo showing normal EF. On exam patient's left arm pain is not reproducible on moving her left arm and patient has no localized tenderness. Patient states she does have chronic neck pain.   Review of Systems: As presented in the history of presenting illness, rest negative.  Past Medical History  Diagnosis Date  . Glaucoma (increased eye pressure)   . Ovarian cyst, right 2009    s/p BSO 2009: OVARIAN FIBROTHECOMA in multiple fluid filled cysts  4.5cm region  . Atrophy of left kidney     Chronic atrophy of the left kidney.  . Chronic constipation   . COPD (chronic obstructive pulmonary disease)     Hyperinflation and heavy smoking history strongly  . HTN (hypertension) 04/12/2012  . Benign breast cysts in female 04/12/2012    Seen on mammography 2009   . UTI (lower urinary tract infection)     History of chronic recurrent UTIs  . Malnutrition 04/28/2012  . Ileus following gastrointestinal surgery 04/28/2012  . SBO (small bowel obstruction)   . Severe malnutrition 09/16/2013  . Ejection fraction    Past Surgical History  Procedure  Laterality Date  . Bilateral oophorectomy  2009    PROCEDURE:  diagnostic laparoscopy, laparotomy with bilateral salpingo-  . Laparotomy  04/23/2012    Procedure: EXPLORATORY LAPAROTOMY;  Surgeon: Valarie MerinoMatthew B Martin, MD;  Location: [facility];  Service: General;  Laterality: N/A;  enterolysis    Social History:  reports that she quit smoking about 4 weeks ago. Her smoking use included Cigarettes. She has a 48 pack-year smoking history. She has never used smokeless tobacco. She reports that she does not drink alcohol or use illicit drugs. Where does patient live at home. Can patient participate in ADLs? Yes.  No Known Allergies  Family History:  Family History  Problem Relation Age of Onset  . Diabetes Son   . High blood pressure Son   . Coronary artery disease Son       Prior to Admission medications   Medication Sig Start Date End Date Taking? Authorizing Provider  aspirin 325 MG tablet Take 325 mg by mouth every morning.    Yes Historical Provider, MD  polyethylene glycol (MIRALAX / GLYCOLAX) packet Take 17 g by mouth daily as needed for mild constipation (constipation).   Yes Historical Provider, MD  Travoprost, BAK Free, (TRAVATAN) 0.004 % SOLN ophthalmic solution Place 1 drop into both eyes at bedtime.    Yes Historical Provider, MD  vitamin B-12 1000 MCG tablet Take 1 tablet (1,000 mcg total) by mouth daily. 09/18/13  Yes Renae FickleMackenzie Short, MD    Physical Exam: Ceasar MonsFiled Vitals:   04/19/14 1930 04/19/14  1945 04/19/14 2030 04/19/14 2053  BP: 124/77 125/76 161/95 161/95  Pulse: 53 55 53 52  Temp:      TempSrc:      Resp: 15 14 15 16   SpO2: 96% 98% 97% 99%     General:  Moderately built and poorly nourished.  Eyes: Anicteric no pallor.  ENT: No discharge from the ears eyes nose and mouth.  Neck: No mass felt and no neck rigidity.  Cardiovascular: S1-S2 heard.  Respiratory: No rhonchi or crepitations.  Abdomen: Soft nontender bowel sounds present. No guarding  rigidity.  Skin: No rash.  Musculoskeletal: No edema.  Psychiatric: Appears normal.  Neurologic: Alert awake oriented to time place and person and moves all extremities 5 x 5.  Labs on Admission:  Basic Metabolic Panel:  Recent Labs Lab 04/19/14 1838  NA 136*  K 4.5  CL 100  GLUCOSE 92  BUN 7  CREATININE 1.00   Liver Function Tests: No results found for this basename: AST, ALT, ALKPHOS, BILITOT, PROT, ALBUMIN,  in the last 168 hours No results found for this basename: LIPASE, AMYLASE,  in the last 168 hours No results found for this basename: AMMONIA,  in the last 168 hours CBC:  Recent Labs Lab 04/19/14 1828 04/19/14 1838  WBC 5.8  --   HGB 12.4 12.9  HCT 38.2 38.0  MCV 92.3  --   PLT 191  --    Cardiac Enzymes: No results found for this basename: CKTOTAL, CKMB, CKMBINDEX, TROPONINI,  in the last 168 hours  BNP (last 3 results) No results found for this basename: PROBNP,  in the last 8760 hours CBG: No results found for this basename: GLUCAP,  in the last 168 hours  Radiological Exams on Admission: Dg Chest 2 View  04/19/2014   CLINICAL DATA:  Left arm pain, COPD  EXAM: CHEST  2 VIEW  COMPARISON:  09/16/2013  FINDINGS: Chronic interstitial markings/ emphysematous changes. No pleural effusion or pneumothorax.  The heart is normal in size.  Degenerative changes of the thoracic spine with levoscoliosis.  IMPRESSION: No evidence of acute cardiopulmonary disease.   Electronically Signed   By: Charline BillsSriyesh  Krishnan M.D.   On: 04/19/2014 19:12    EKG: Independently reviewed. Normal sinus rhythm with LVH.  Assessment/Plan Principal Problem:   Arm pain Active Problems:   COPD (chronic obstructive pulmonary disease)   HTN (hypertension)   Left arm pain   1. Left arm pain concerning for ACS - as per the discharge summary patient has had a stress test early part of this year which was negative for ischemia. Given patient's risk factors including age history of tobacco  abuse and hypertension we will cycle cardiac markers to rule out ACS. Patient will be placed on aspirin and when necessary nitroglycerin. Since patient has neck pain also we will check x-ray of the C-spine. 2. Hypertension uncontrolled - patient states she has not been taking her antihypertensives for 2 months. On previous discharge patient was on beta blockers and ace inhibitors and HCTZ and I have placed patient on HCTZ for now and closely observe blood pressure trends patient is also on when necessary IV hydralazine. 3. COPD - presently not wheezing. 4. History of tobacco abuse quit more than a year ago.    Code Status: Full code.  Family Communication: None.  Disposition Plan: Admit for observation.    Keerthi Hazell N. [facility] Pager [phone].  If 7PM-7AM, please contact night-coverage [email or web address] Password [email or web address] 04/19/2014, 9:27 PM

## 2014-04-19 NOTE — ED Notes (Signed)
Patient taken to radiology for CXR Patient in NAD upon leaving for testing

## 2014-04-19 NOTE — ED Notes (Signed)
Patient continues to c/o 6/10 "aching" to the left shoulder s/p 2nd SL NTG tab Patient states that pain started this morning when she woke up---she reports that she did do strenuous activity yesterday ("pulled laundry out of the washer")

## 2014-04-19 NOTE — ED Notes (Signed)
Dr. Walden at bedside 

## 2014-04-19 NOTE — ED Notes (Addendum)
Now patient reports that the left arm/shoulder pain is now 3-4/10 on pain scale s/p admin of 2 SL NTG tabs Patient initially stated that PIV site was a 6/10 and that was the location that she believed I was completing a pain assessment on Patient then stated that the left shoulder pain which prompted her to come to the ED is now decreased--as stated above Patient then states, "We he (the EDP) said that my heart is drying out" VS updated and stable Patient appears in NAD Will make EDP aware

## 2014-04-20 DIAGNOSIS — J438 Other emphysema: Secondary | ICD-10-CM

## 2014-04-20 DIAGNOSIS — M79602 Pain in left arm: Secondary | ICD-10-CM | POA: Diagnosis not present

## 2014-04-20 DIAGNOSIS — K5900 Constipation, unspecified: Secondary | ICD-10-CM | POA: Diagnosis not present

## 2014-04-20 DIAGNOSIS — I10 Essential (primary) hypertension: Secondary | ICD-10-CM | POA: Diagnosis not present

## 2014-04-20 LAB — COMPREHENSIVE METABOLIC PANEL
ALT: 11 U/L (ref 0–35)
ANION GAP: 10 (ref 5–15)
AST: 19 U/L (ref 0–37)
Albumin: 3.3 g/dL — ABNORMAL LOW (ref 3.5–5.2)
Alkaline Phosphatase: 119 U/L — ABNORMAL HIGH (ref 39–117)
BILIRUBIN TOTAL: 0.3 mg/dL (ref 0.3–1.2)
BUN: 9 mg/dL (ref 6–23)
CALCIUM: 9.6 mg/dL (ref 8.4–10.5)
CHLORIDE: 100 meq/L (ref 96–112)
CO2: 30 meq/L (ref 19–32)
CREATININE: 1.07 mg/dL (ref 0.50–1.10)
GFR, EST AFRICAN AMERICAN: 56 mL/min — AB (ref >90)
GFR, EST NON AFRICAN AMERICAN: 48 mL/min — AB (ref >90)
GLUCOSE: 94 mg/dL (ref 70–99)
Potassium: 4.4 mEq/L (ref 3.7–5.3)
Sodium: 140 mEq/L (ref 137–147)
Total Protein: 6.4 g/dL (ref 6.0–8.3)

## 2014-04-20 LAB — URINALYSIS, ROUTINE W REFLEX MICROSCOPIC
BILIRUBIN URINE: NEGATIVE
Glucose, UA: NEGATIVE mg/dL
Hgb urine dipstick: NEGATIVE
KETONES UR: NEGATIVE mg/dL
Leukocytes, UA: NEGATIVE
NITRITE: NEGATIVE
Protein, ur: NEGATIVE mg/dL
Specific Gravity, Urine: 1.007 (ref 1.005–1.030)
UROBILINOGEN UA: 0.2 mg/dL (ref 0.0–1.0)
pH: 7 (ref 5.0–8.0)

## 2014-04-20 LAB — CBC WITH DIFFERENTIAL/PLATELET
BASOS ABS: 0 10*3/uL (ref 0.0–0.1)
Basophils Relative: 1 % (ref 0–1)
EOS PCT: 4 % (ref 0–5)
Eosinophils Absolute: 0.2 10*3/uL (ref 0.0–0.7)
HCT: 38.2 % (ref 36.0–46.0)
Hemoglobin: 12.4 g/dL (ref 12.0–15.0)
LYMPHS PCT: 27 % (ref 12–46)
Lymphs Abs: 1.5 10*3/uL (ref 0.7–4.0)
MCH: 30 pg (ref 26.0–34.0)
MCHC: 32.5 g/dL (ref 30.0–36.0)
MCV: 92.3 fL (ref 78.0–100.0)
Monocytes Absolute: 0.6 10*3/uL (ref 0.1–1.0)
Monocytes Relative: 10 % (ref 3–12)
NEUTROS PCT: 58 % (ref 43–77)
Neutro Abs: 3.3 10*3/uL (ref 1.7–7.7)
PLATELETS: 171 10*3/uL (ref 150–400)
RBC: 4.14 MIL/uL (ref 3.87–5.11)
RDW: 13.2 % (ref 11.5–15.5)
WBC: 5.6 10*3/uL (ref 4.0–10.5)

## 2014-04-20 LAB — TROPONIN I
Troponin I: <0.3 ng/mL (ref <0.30)
Troponin I: <0.3 ng/mL (ref <0.30)

## 2014-04-20 MED ORDER — HYDROCHLOROTHIAZIDE 12.5 MG PO CAPS: 12.5000 mg | ORAL_CAPSULE | Freq: Every day | ORAL | Status: DC

## 2014-04-20 MED ORDER — ENOXAPARIN SODIUM 30 MG/0.3ML ~~LOC~~ SOLN
30.0000 mg | Freq: Every day | SUBCUTANEOUS | Status: DC
  Filled 2014-04-20: qty 0.3

## 2014-04-20 MED ORDER — OXYCODONE-ACETAMINOPHEN 5-325 MG PO TABS: 1.0000 | ORAL_TABLET | ORAL | Status: DC | PRN

## 2014-04-20 NOTE — Progress Notes (Signed)
Report received from M. Long,RN. No change in assessment. Will continue plan of care. Amber Rowland 

## 2014-04-20 NOTE — Progress Notes (Signed)
  CARE MANAGEMENT ED NOTE 04/20/2014  [facility]:  [facility]ardelle A   Account Number:  [id number]  Date Initiated:  04/20/2014  Documentation initiated by:  Radford PaxFERRERO,Antwan Pandya  Subjective/Objective Assessment:   Patient admitted for persistent left arm pain     Subjective/Objective Assessment Detail:     Action/Plan:   Action/Plan Detail:   Anticipated DC Date:  04/20/2014     Status Recommendation to Physician:   Result of Recommendation:    Other ED Services  Consult Working Plan    DC Planning Services  CM consult  Other  PCP issues   [facility]   Choice offered to / List presented to:  C-1 Patient  DME arranged  Levan HurstWALKER - ROLLING     DME agency  [facility]   HH arranged  HH-2 PT  HH-3 OT      [facility]    Status of service:  Completed, signed off  ED Comments:   ED Comments Detail:  EDCM called to assist patient with finding a pcp.  Patient with Medicare insurance.  EDCM noted in chart home health orders for PT OT and rolling walker.  EDCM spoke to 4 west RN who reports patient has a walker at bedside.  EDCM spoke ot patient and her family at bedside.  Patient reports no one came to speak to her about home health PT OT besides the doctor.  [facility] chosen [facility].   [facility] with a list of pcps who accept Medicare insurnace within a 10 mile radius of her zip code [phone].  Patient's daughter reports she will assist patient in finding a pcp.  Patient reports, "Can you ask them to wait two or three weeks before they come out to my house so that I can clean it up?"  [facility]  No further EDCM needs at this time.

## 2014-04-20 NOTE — Evaluation (Signed)
Physical Therapy Evaluation Patient Details Name: Amber Rowland MRN: [id number] DOB: 07/10/1933 Today's Date: 04/20/2014   History of Present Illness  80 y.o. female with history of COPD and hypertension and previous history of tobacco abuse admitted for persistent left arm pain since morning concerning for ACS. C-spine x-ray: 4 mm anterolisthesis at C3-4 and C4-5 and Advanced lower cervical degenerative disc disease  Clinical Impression  Pt currently with functional limitations due to the deficits listed below (see PT Problem List).  Pt will benefit from skilled PT to increase their independence and safety with mobility to allow discharge to the venue listed below.  Pt reports increased L arm pain with movement as well as entire spine pain with activity however also reports mopping at home for a long time just prior to admission.   Discussed possible f/u PT for arm and back pain as well as balance however pt declined and states she would like to try pain meds first.  Discussed safety at home with assistive device but pt declined so recommended mobility at home initially with supervision due to unsteady gait and possibility of d/c on pain meds (per pt).      Follow Up Recommendations Home health PT;Supervision for mobility/OOB    Equipment Recommendations  Rolling walker with 5" wheels (pt declined any assistive device at this time)    Recommendations for Other Services       Precautions / Restrictions Precautions Precautions: Fall      Mobility  Bed Mobility Overal bed mobility: Modified Independent                Transfers Overall transfer level: Needs assistance Equipment used: None Transfers: Sit to/from Stand Sit to Stand: Min guard            Ambulation/Gait Ambulation/Gait assistance: Min guard Ambulation Distance (Feet): 200 Feet Assistive device: None Gait Pattern/deviations: Wide base of support;Decreased stride length;Step-through pattern Gait  velocity: decr   General Gait Details: increased BOS observed with short step length, pt not agreeable to RW, min/guard for safety as pt slightly unsteady at times but able to self correct  Stairs            Wheelchair Mobility    Modified Rankin (Stroke Patients Only)       Balance                                             Pertinent Vitals/Pain Pain Assessment: 0-10 Pain Score: 7  Pain Location: L arm with  and entire spine with testing, better at rest Pain Descriptors / Indicators: Aching Pain Intervention(s): Limited activity within patient's tolerance    Home Living Family/patient expects to be discharged to:: Private residence Living Arrangements: Children (son) Available Help at Discharge: Family;Available PRN/intermittently         Home Layout: One level Home Equipment: None      Prior Function Level of Independence: Independent               Hand Dominance        Extremity/Trunk Assessment   Upper Extremity Assessment: LUE deficits/detail       LUE Deficits / Details: pain with MMT in upper arm and shoulder however no difference in strength observed and good AROM   Lower Extremity Assessment: Generalized weakness      Cervical / Trunk Assessment: Kyphotic;Other exceptions  Communication   Communication: No difficulties  Cognition Arousal/Alertness: Awake/alert Behavior During Therapy: WFL for tasks assessed/performed Overall Cognitive Status: Within Functional Limits for tasks assessed                      General Comments      Exercises        Assessment/Plan    PT Assessment Patient needs continued PT services  PT Diagnosis Generalized weakness;Acute pain   PT Problem List Decreased strength;Decreased mobility;Decreased knowledge of use of DME;Pain  PT Treatment Interventions Gait training;DME instruction;Functional mobility training;Therapeutic activities;Therapeutic  exercise;Patient/family education   PT Goals (Current goals can be found in the Care Plan section) Acute Rehab PT Goals Patient Stated Goal: pain control  PT Goal Formulation: With patient Time For Goal Achievement: 04/27/14 Potential to Achieve Goals: Good    Frequency Min 3X/week   Barriers to discharge        Co-evaluation               End of Session   Activity Tolerance: Patient tolerated treatment well Patient left: in bed;with call bell/phone within reach;with bed alarm set      Functional Assessment Tool Used: clinical judgement Functional Limitation: Mobility: Walking and moving around Mobility: Walking and Moving Around Current Status ([id number]([id number]): At least 1 percent but less than 20 percent impaired, limited or restricted Mobility: Walking and Moving Around Goal Status [phone](G8979): 0 percent impaired, limited or restricted    Time: 1147-1202 PT Time Calculation (min): 15 min   Charges:   PT Evaluation $Initial PT Evaluation Tier I: 1 Procedure PT Treatments $Gait Training: 8-22 mins   PT G Codes:   Functional Assessment Tool Used: clinical judgement Functional Limitation: Mobility: Walking and moving around    MicrosoftLEMYRE,KATHrine E 04/20/2014, 1:15 PM Zenovia JarredKati Katina Remick, PT, DPT 04/20/2014 Pager: [phone]

## 2014-04-20 NOTE — Discharge Summary (Signed)
Physician Discharge Summary  Amber Rowland QIH:[id number] DOB: 03/17/1934 DOA: 04/19/2014  PCP: No primary provider on file.  Admit date: 04/19/2014 Discharge date: 04/20/2014  Time spent: 35 minutes  Recommendations for Outpatient Follow-up:  1. Establish and follow up with PCP in 1-2 weeks 2. Recommend referral to Orthopedic surgery for marked arthritis  Discharge Diagnoses:  Principal Problem:   Arm pain Active Problems:   COPD (chronic obstructive pulmonary disease)   HTN (hypertension)   Left arm pain   Discharge Condition: Stable  Diet recommendation: Regular  Filed Weights   04/19/14 2200  Weight: 37.6 kg (82 lb 14.3 oz)    History of present illness:  Please see admit h and p from 10/29 for details. Briefly, pt presents with L arm pain. Denied chest pain. Given risk factors, pt was admitted for ACS r/o.  Hospital Course:  Pt was admitted to the floor. Xray of neck was obtained which demonstrated severe DJD. Serial cardiac enzymes were neg x 4. Of note, pt is s/p negative stress test on 3/15. On further questioning, pt's L arm pain is more suggestive of radicular type pain, especially given cervical spine findings. Pt was started on PO analgesics. Pt was seen by PT with recs for home PT and walker for home. Pt has been instructed to follow up closely with PCP. She may benefit from referral to Orthopedic surgery given concerns of marked arthritis.  Consultations:  none  Discharge Exam: Filed Vitals:   04/19/14 2200 04/19/14 2334 04/20/14 0420 04/20/14 1328  BP: 166/83  158/74 165/79  Pulse: 53  56 81  Temp: 97.7 F (36.5 C)  97.3 F (36.3 C) 99.5 F (37.5 C)  TempSrc: Oral  Oral Oral  Resp: 16  16 16   Height: 5' (1.524 m)     Weight: 37.6 kg (82 lb 14.3 oz)     SpO2: 97% 98% 98% 97%    General: awake, in nad Cardiovascular: regular, s1, s2 Respiratory: normal resp effort, no wheezing  Discharge Instructions     Medication List          aspirin 325 MG tablet  Take 325 mg by mouth every morning.     cyanocobalamin 1000 MCG tablet  Take 1 tablet (1,000 mcg total) by mouth daily.     hydrochlorothiazide 12.5 MG capsule  Commonly known as:  MICROZIDE  Take 1 capsule (12.5 mg total) by mouth daily.     oxyCODONE-acetaminophen 5-325 MG per tablet  Commonly known as:  PERCOCET/ROXICET  Take 1 tablet by mouth every 4 (four) hours as needed for severe pain.     polyethylene glycol packet  Commonly known as:  MIRALAX / GLYCOLAX  Take 17 g by mouth daily as needed for mild constipation (constipation).     Travoprost (BAK Free) 0.004 % Soln ophthalmic solution  Commonly known as:  TRAVATAN  Place 1 drop into both eyes at bedtime.       No Known Allergies Follow-up Information   Schedule an appointment as soon as possible for a visit with Establish with PCP to be seen in 1-2 weeks.       The results of significant diagnostics from this hospitalization (including imaging, microbiology, ancillary and laboratory) are listed below for reference.    Significant Diagnostic Studies: Dg Chest 2 View  04/19/2014   CLINICAL DATA:  Left arm pain, COPD  EXAM: CHEST  2 VIEW  COMPARISON:  09/16/2013  FINDINGS: Chronic interstitial markings/ emphysematous changes. No pleural effusion or  pneumothorax.  The heart is normal in size.  Degenerative changes of the thoracic spine with levoscoliosis.  IMPRESSION: No evidence of acute cardiopulmonary disease.   Electronically Signed   By: Charline BillsSriyesh  Krishnan M.D.   On: 04/19/2014 19:12   Dg Cervical Spine Complete  04/19/2014   CLINICAL DATA:  Acute on chronic neck pain without injury.  EXAM: CERVICAL SPINE  4+ VIEWS  COMPARISON:  CT of the cervical spine report 08/29/2002  FINDINGS: There is no visible fracture. Osteopenia and limited mobility with suboptimal patient positioning significantly limits diagnostic sensitivity.  There is anterolisthesis at C4-5 and C3-4 which measures 4 mm.  Anterolisthesis present at C4-5 per 2004 CT report. There is advanced facet degeneration at these levels, which is the most likely cause, although traumatic malalignment cannot be excluded.  There is advanced degenerative disc disease, with complete disc loss at C5-6 and C6-7.  No prevertebral swelling.  IMPRESSION: 1. Radiography is limited due to osteopenia and difficulty with patient positioning. If there is any historical indication of acute cervical spine injury, CT recommended. 2. 4 mm anterolisthesis at C3-4 and C4-5. This is usually related to chronic facet disease, although ligamentous injury/insufficiency not excluded. 3. Advanced lower cervical degenerative disc disease.   Electronically Signed   By: Tiburcio PeaJonathan  Watts M.D.   On: 04/19/2014 23:24    Microbiology: No results found for this or any previous visit (from the past 240 hour(s)).   Labs: Basic Metabolic Panel:  Recent Labs Lab 04/19/14 1838 04/19/14 2236 04/20/14 0405  NA 136*  --  140  K 4.5  --  4.4  CL 100  --  100  CO2  --   --  30  GLUCOSE 92  --  94  BUN 7  --  9  CREATININE 1.00 1.10 1.07  CALCIUM  --   --  9.6   Liver Function Tests:  Recent Labs Lab 04/20/14 0405  AST 19  ALT 11  ALKPHOS 119*  BILITOT 0.3  PROT 6.4  ALBUMIN 3.3*   No results found for this basename: LIPASE, AMYLASE,  in the last 168 hours No results found for this basename: AMMONIA,  in the last 168 hours CBC:  Recent Labs Lab 04/19/14 1828 04/19/14 1838 04/19/14 2236 04/20/14 0405  WBC 5.8  --  5.9 5.6  NEUTROABS  --   --   --  3.3  HGB 12.4 12.9 12.0 12.4  HCT 38.2 38.0 37.4 38.2  MCV 92.3  --  93.0 92.3  PLT 191  --  174 171   Cardiac Enzymes:  Recent Labs Lab 04/19/14 2236 04/20/14 0405 04/20/14 1015  TROPONINI <0.30 <0.30 <0.30   BNP: BNP (last 3 results) No results found for this basename: PROBNP,  in the last 8760 hours CBG: No results found for this basename: GLUCAP,  in the last 168  hours     Signed:  Blossie Raffel K  [facility] 04/20/2014, 3:10 PM

## 2014-04-20 NOTE — Progress Notes (Signed)
04/20/2014 A.Terez Freimark RNCM 2000pm [facility] 1957[facility] with confirmation of receipt at 1959pm.

## 2014-04-20 NOTE — Progress Notes (Signed)
UR completed 

## 2014-04-26 DIAGNOSIS — H4011X2 Primary open-angle glaucoma, moderate stage: Secondary | ICD-10-CM | POA: Diagnosis not present

## 2014-05-04 DIAGNOSIS — I10 Essential (primary) hypertension: Secondary | ICD-10-CM | POA: Diagnosis not present

## 2014-05-04 DIAGNOSIS — H3530 Unspecified macular degeneration: Secondary | ICD-10-CM | POA: Diagnosis not present

## 2014-05-04 DIAGNOSIS — H4011X0 Primary open-angle glaucoma, stage unspecified: Secondary | ICD-10-CM | POA: Diagnosis not present

## 2014-05-04 DIAGNOSIS — H2510 Age-related nuclear cataract, unspecified eye: Secondary | ICD-10-CM | POA: Diagnosis not present

## 2014-05-24 DIAGNOSIS — H547 Unspecified visual loss: Secondary | ICD-10-CM | POA: Diagnosis not present

## 2014-05-24 DIAGNOSIS — H2512 Age-related nuclear cataract, left eye: Secondary | ICD-10-CM | POA: Diagnosis not present

## 2014-05-24 DIAGNOSIS — H269 Unspecified cataract: Secondary | ICD-10-CM | POA: Diagnosis not present

## 2014-05-24 DIAGNOSIS — H2589 Other age-related cataract: Secondary | ICD-10-CM | POA: Diagnosis not present

## 2014-07-05 ENCOUNTER — Encounter (HOSPITAL_COMMUNITY): Payer: Self-pay | Admitting: General Surgery

## 2014-10-15 ENCOUNTER — Emergency Department (HOSPITAL_COMMUNITY): Payer: Medicare Other

## 2014-10-15 ENCOUNTER — Encounter (HOSPITAL_COMMUNITY): Payer: Self-pay

## 2014-10-15 ENCOUNTER — Emergency Department (HOSPITAL_COMMUNITY)
Admission: EM | Admit: 2014-10-15 | Discharge: 2014-10-15 | Disposition: A | Discharge status: Home / Self Care | Payer: Medicare Other | Attending: Emergency Medicine | Admitting: Emergency Medicine

## 2014-10-15 DIAGNOSIS — Z8742 Personal history of other diseases of the female genital tract: Secondary | ICD-10-CM | POA: Diagnosis not present

## 2014-10-15 DIAGNOSIS — M79602 Pain in left arm: Secondary | ICD-10-CM | POA: Insufficient documentation

## 2014-10-15 DIAGNOSIS — Z87440 Personal history of urinary (tract) infections: Secondary | ICD-10-CM | POA: Diagnosis not present

## 2014-10-15 DIAGNOSIS — J449 Chronic obstructive pulmonary disease, unspecified: Secondary | ICD-10-CM | POA: Diagnosis not present

## 2014-10-15 DIAGNOSIS — Z8639 Personal history of other endocrine, nutritional and metabolic disease: Secondary | ICD-10-CM | POA: Insufficient documentation

## 2014-10-15 DIAGNOSIS — Z79899 Other long term (current) drug therapy: Secondary | ICD-10-CM | POA: Insufficient documentation

## 2014-10-15 DIAGNOSIS — R079 Chest pain, unspecified: Secondary | ICD-10-CM | POA: Diagnosis present

## 2014-10-15 DIAGNOSIS — R110 Nausea: Secondary | ICD-10-CM | POA: Insufficient documentation

## 2014-10-15 DIAGNOSIS — I10 Essential (primary) hypertension: Secondary | ICD-10-CM | POA: Insufficient documentation

## 2014-10-15 DIAGNOSIS — Z8669 Personal history of other diseases of the nervous system and sense organs: Secondary | ICD-10-CM | POA: Diagnosis not present

## 2014-10-15 DIAGNOSIS — Z87891 Personal history of nicotine dependence: Secondary | ICD-10-CM | POA: Diagnosis not present

## 2014-10-15 DIAGNOSIS — M5412 Radiculopathy, cervical region: Secondary | ICD-10-CM | POA: Insufficient documentation

## 2014-10-15 DIAGNOSIS — Z7982 Long term (current) use of aspirin: Secondary | ICD-10-CM | POA: Insufficient documentation

## 2014-10-15 DIAGNOSIS — R0789 Other chest pain: Secondary | ICD-10-CM | POA: Insufficient documentation

## 2014-10-15 DIAGNOSIS — Z8719 Personal history of other diseases of the digestive system: Secondary | ICD-10-CM | POA: Insufficient documentation

## 2014-10-15 DIAGNOSIS — M79622 Pain in left upper arm: Secondary | ICD-10-CM | POA: Diagnosis not present

## 2014-10-15 DIAGNOSIS — R109 Unspecified abdominal pain: Secondary | ICD-10-CM | POA: Diagnosis not present

## 2014-10-15 LAB — CBC
HCT: 40.8 % (ref 36.0–46.0)
HEMOGLOBIN: 13.1 g/dL (ref 12.0–15.0)
MCH: 30.6 pg (ref 26.0–34.0)
MCHC: 32.1 g/dL (ref 30.0–36.0)
MCV: 95.3 fL (ref 78.0–100.0)
PLATELETS: 211 10*3/uL (ref 150–400)
RBC: 4.28 MIL/uL (ref 3.87–5.11)
RDW: 13.1 % (ref 11.5–15.5)
WBC: 7.6 10*3/uL (ref 4.0–10.5)

## 2014-10-15 LAB — HEPATIC FUNCTION PANEL
ALT: 17 U/L (ref 0–35)
AST: 27 U/L (ref 0–37)
Albumin: 4.3 g/dL (ref 3.5–5.2)
Alkaline Phosphatase: 151 U/L — ABNORMAL HIGH (ref 39–117)
BILIRUBIN DIRECT: 0.1 mg/dL (ref 0.0–0.5)
Indirect Bilirubin: 0.2 mg/dL — ABNORMAL LOW (ref 0.3–0.9)
Total Bilirubin: 0.3 mg/dL (ref 0.3–1.2)
Total Protein: 7.6 g/dL (ref 6.0–8.3)

## 2014-10-15 LAB — BASIC METABOLIC PANEL
ANION GAP: 8 (ref 5–15)
BUN: 13 mg/dL (ref 6–23)
CO2: 29 mmol/L (ref 19–32)
Calcium: 9.5 mg/dL (ref 8.4–10.5)
Chloride: 100 mmol/L (ref 96–112)
Creatinine, Ser: 1.04 mg/dL (ref 0.50–1.10)
GFR calc Af Amer: 58 mL/min — ABNORMAL LOW (ref >90)
GFR calc non Af Amer: 50 mL/min — ABNORMAL LOW (ref >90)
GLUCOSE: 94 mg/dL (ref 70–99)
POTASSIUM: 4.2 mmol/L (ref 3.5–5.1)
Sodium: 137 mmol/L (ref 135–145)

## 2014-10-15 LAB — I-STAT TROPONIN, ED: Troponin i, poc: 0 ng/mL (ref 0.00–0.08)

## 2014-10-15 LAB — LIPASE, BLOOD: LIPASE: 33 U/L (ref 11–59)

## 2014-10-15 MED ORDER — ASPIRIN 325 MG PO TABS
325.0000 mg | ORAL_TABLET | Freq: Once | ORAL | Status: AC
  Administered 2014-10-15: 325 mg via ORAL
  Filled 2014-10-15: qty 1

## 2014-10-15 MED ORDER — OXYCODONE-ACETAMINOPHEN 5-325 MG PO TABS
1.0000 | ORAL_TABLET | Freq: Once | ORAL | Status: AC
  Administered 2014-10-15: 1 via ORAL
  Filled 2014-10-15: qty 1

## 2014-10-15 MED ORDER — OMEPRAZOLE 20 MG PO CPDR: 20.0000 mg | DELAYED_RELEASE_CAPSULE | Freq: Every day | ORAL | Status: DC

## 2014-10-15 MED ORDER — FAMOTIDINE 20 MG PO TABS
20.0000 mg | ORAL_TABLET | Freq: Once | ORAL | Status: AC
  Administered 2014-10-15: 20 mg via ORAL
  Filled 2014-10-15: qty 1

## 2014-10-15 MED ORDER — OXYCODONE-ACETAMINOPHEN 5-325 MG PO TABS: 1.0000 | ORAL_TABLET | Freq: Three times a day (TID) | ORAL | Status: DC | PRN

## 2014-10-15 NOTE — Discharge Instructions (Signed)
Cervical Radiculopathy °Cervical radiculopathy happens when a nerve in the neck is pinched or bruised by a slipped (herniated) disk or by arthritic changes in the bones of the cervical spine. This can occur due to an injury or as part of the normal aging process. Pressure on the cervical nerves can cause pain or numbness that runs from your neck all the way down into your arm and fingers. °CAUSES  °There are many possible causes, including: °· Injury. °· Muscle tightness in the neck from overuse. °· Swollen, painful joints (arthritis). °· Breakdown or degeneration in the bones and joints of the spine (spondylosis) due to aging. °· Bone spurs that may develop near the cervical nerves. °SYMPTOMS  °Symptoms include pain, weakness, or numbness in the affected arm and hand. Pain can be severe or irritating. Symptoms may be worse when extending or turning the neck. °DIAGNOSIS  °Your caregiver will ask about your symptoms and do a physical exam. He or she may test your strength and reflexes. X-rays, CT scans, and MRI scans may be needed in cases of injury or if the symptoms do not go away after a period of time. Electromyography (EMG) or nerve conduction testing may be done to study how your nerves and muscles are working. °TREATMENT  °Your caregiver may recommend certain exercises to help relieve your symptoms. Cervical radiculopathy can, and often does, get better with time and treatment. If your problems continue, treatment options may include: °· Wearing a soft collar for short periods of time. °· Physical therapy to strengthen the neck muscles. °· Medicines, such as nonsteroidal anti-inflammatory drugs (NSAIDs), oral corticosteroids, or spinal injections. °· Surgery. Different types of surgery may be done depending on the cause of your problems. °HOME CARE INSTRUCTIONS  °· Put ice on the affected area. °¨ Put ice in a plastic bag. °¨ Place a towel between your skin and the bag. °¨ Leave the ice on for 15-20 minutes,  03-04 times a day or as directed by your caregiver. °· If ice does not help, you can try using heat. Take a warm shower or bath, or use a hot water bottle as directed by your caregiver. °· You may try a gentle neck and shoulder massage. °· Use a flat pillow when you sleep. °· Only take over-the-counter or prescription medicines for pain, discomfort, or fever as directed by your caregiver. °· If physical therapy was prescribed, follow your caregiver's directions. °· If a soft collar was prescribed, use it as directed. °SEEK IMMEDIATE MEDICAL CARE IF:  °· Your pain gets much worse and cannot be controlled with medicines. °· You have weakness or numbness in your hand, arm, face, or leg. °· You have a high fever or a stiff, rigid neck. °· You lose bowel or bladder control (incontinence). °· You have trouble with walking, balance, or speaking. °MAKE SURE YOU:  °· Understand these instructions. °· Will watch your condition. °· Will get help right away if you are not doing well or get worse. °Document Released: 03/03/2001 Document Revised: 08/31/2011 Document Reviewed: 01/20/2011 °ExitCare® Patient Information ©2015 [employer]. This information is not intended to replace advice given to you by your health care provider. Make sure you discuss any questions you have with your health care provider. ° °Chest Pain (Nonspecific) °It is often hard to give a specific diagnosis for the cause of chest pain. There is always a chance that your pain could be related to something serious, such as a heart attack or a blood clot   in the lungs. You need to follow up with your health care provider for further evaluation. °CAUSES  °· Heartburn. °· Pneumonia or bronchitis. °· Anxiety or stress. °· Inflammation around your heart (pericarditis) or lung (pleuritis or pleurisy). °· A blood clot in the lung. °· A collapsed lung (pneumothorax). It can develop suddenly on its own (spontaneous pneumothorax) or from trauma to the chest. °· Shingles  infection (herpes zoster virus). °The chest wall is composed of bones, muscles, and cartilage. Any of these can be the source of the pain. °· The bones can be bruised by injury. °· The muscles or cartilage can be strained by coughing or overwork. °· The cartilage can be affected by inflammation and become sore (costochondritis). °DIAGNOSIS  °Lab tests or other studies may be needed to find the cause of your pain. Your health care provider may have you take a test called an ambulatory electrocardiogram (ECG). An ECG records your heartbeat patterns over a 24-hour period. You may also have other tests, such as: °· Transthoracic echocardiogram (TTE). During echocardiography, sound waves are used to evaluate how blood flows through your heart. °· Transesophageal echocardiogram (TEE). °· Cardiac monitoring. This allows your health care provider to monitor your heart rate and rhythm in real time. °· Holter monitor. This is a portable device that records your heartbeat and can help diagnose heart arrhythmias. It allows your health care provider to track your heart activity for several days, if needed. °· Stress tests by exercise or by giving medicine that makes the heart beat faster. °TREATMENT  °· Treatment depends on what may be causing your chest pain. Treatment may include: °¨ Acid blockers for heartburn. °¨ Anti-inflammatory medicine. °¨ Pain medicine for inflammatory conditions. °¨ Antibiotics if an infection is present. °· You may be advised to change lifestyle habits. This includes stopping smoking and avoiding alcohol, caffeine, and chocolate. °· You may be advised to keep your head raised (elevated) when sleeping. This reduces the chance of acid going backward from your stomach into your esophagus. °Most of the time, nonspecific chest pain will improve within 2-3 days with rest and mild pain medicine.  °HOME CARE INSTRUCTIONS  °· If antibiotics were prescribed, take them as directed. Finish them even if you start  to feel better. °· For the next few days, avoid physical activities that bring on chest pain. Continue physical activities as directed. °· Do not use any tobacco products, including cigarettes, chewing tobacco, or electronic cigarettes. °· Avoid drinking alcohol. °· Only take medicine as directed by your health care provider. °· Follow your health care provider's suggestions for further testing if your chest pain does not go away. °· Keep any follow-up appointments you made. If you do not go to an appointment, you could develop lasting (chronic) problems with pain. If there is any problem keeping an appointment, call to reschedule. °SEEK MEDICAL CARE IF:  °· Your chest pain does not go away, even after treatment. °· You have a rash with blisters on your chest. °· You have a fever. °SEEK IMMEDIATE MEDICAL CARE IF:  °· You have increased chest pain or pain that spreads to your arm, neck, jaw, back, or abdomen. °· You have shortness of breath. °· You have an increasing cough, or you cough up blood. °· You have severe back or abdominal pain. °· You feel nauseous or vomit. °· You have severe weakness. °· You faint. °· You have chills. °This is an emergency. Do not wait to see if the pain   will go away. Get medical help at once. Call your local emergency services (911 in [place] Do not drive yourself to the hospital. °MAKE SURE YOU:  °· Understand these instructions. °· Will watch your condition. °· Will get help right away if you are not doing well or get worse. °Document Released: 03/18/2005 Document Revised: 06/13/2013 Document Reviewed: 01/12/2008 °ExitCare® Patient Information ©2015 [employer]. This information is not intended to replace advice given to you by your health care provider. Make sure you discuss any questions you have with your health care provider. ° ° °Emergency Department Resource Guide °1) Find a Doctor and Pay Out of Pocket °Although you won't have to find out who is covered by your insurance plan,  it is a good idea to ask around and get recommendations. You will then need to call the office and see if the doctor you have chosen will accept you as a new patient and what types of options they offer for patients who are self-pay. Some doctors offer discounts or will set up payment plans for their patients who do not have insurance, but you will need to ask so you aren't surprised when you get to your appointment. ° °2) Contact Your Local Health Department °Not all health departments have doctors that can see patients for sick visits, but many do, so it is worth a call to see if yours does. If you don't know where your local health department is, you can check in your phone book. The CDC also has a tool to help you locate your state's health department, and many state websites also have listings of all of their local health departments. ° °3) Find a Walk-in Clinic °If your illness is not likely to be very severe or complicated, you may want to try a walk in clinic. These are popping up all over the country in pharmacies, drugstores, and shopping centers. They're usually staffed by nurse practitioners or physician assistants that have been trained to treat common illnesses and complaints. They're usually fairly quick and inexpensive. However, if you have serious medical issues or chronic medical problems, these are probably not your best option. ° °No Primary Care Doctor: °- Call [facility] at  832-8000 - they can help you locate a primary care doctor that  accepts your insurance, provides certain services, etc. °- Physician Referral Service- 1-800-533-3463 ° °Chronic Pain Problems: °Organization         Address  Phone   Notes  °[facility]  (336) 297-2271 Patients need to be referred by their primary care doctor.  ° °Medication Assistance: °Organization         Address  Phone   Notes  °[place] [facility] [address] °[place], [place] [address] (336)  641-8030 --Must be a resident of [place] °-- Must have NO insurance coverage whatsoever (no Medicaid/ Medicare, etc.) °-- The pt. MUST have a primary care doctor that directs their care regularly and follows them in the community °  °[facility]  (866) 331-1348   °[employer]  (888) 892-1162   ° °Agencies that provide inexpensive medical care: °Organization         Address  Phone   Notes  °[facility]  (336) 832-8035   °[facility]    (336) 832-7272   °[facility] [address] °[place], [place] [address] (336) 832-4777   °[facility] [address], °[place] (336)   271-4999   °[facility]    (336) 373-0678   °[facility]    (336) 272-1050   °[facility] ° [address], [place] Phone:  (336) 832-4444, Fax:  (336) 832-4440 Hours of Operation:  9 am - 6 pm, M-F.  Also accepts Medicaid/Medicare and self-pay.  °[facility] ° [address], [place] Phone: (336) [phone], Fax: (336) [phone]. Hours of Operation:  8:30 am - 5:30 pm, M-F.  Also accepts Medicaid and self-pay.  °[facility] [place] [address], [place] Phone: (336) 878-6027   °[facility] [address], [place], [place] (336)723-1848, Ext. 123 Mondays & Thursdays: 7-9 AM.  First 15 patients are seen on a first come, first serve basis. °  ° °Medicaid-accepting [place] Providers: ° °Organization         Address  Phone   Notes  °[facility] [address], [place] (336) 641-2100 Also accepts self-pay patients.  °[facility] [address], [place] ° (336) 856-9996   °[facility] [address], [place] (336) 288-8857   °[facility] [address] [place] [address], [place] (336) 299-7000   °Veita Bland [address], [place]  ° (336)  [phone] Only accepts [place] Access Medicaid patients after they have their name applied to their card.  ° °Self-Pay (no insurance) in [place]: ° °Organization         Address  Phone   Notes  °Sickle Cell Patients, [facility] [address], [place] (336) 832-1970   °[facility] [address], [place] (336) 832-4400   °[facility] ° [address], [place] (336) 992-4800   °[facility]/Dr. Osei-Bonsu ° [address] [place] [address], [place] or [address], [place] (336) [phone] Phone number for both [place] and [place] locations is the same.  °[facility] [address], [place] (336) 299-0000   °[facility] [address] [place] [address], [place] or [address] (336) 852-7530 °(336) 878-2260   °[facility] [address], [place] (336) 350-1642, phone; (336) 294-5005, fax Sees patients 1st and 3rd Saturday of every month.  Must not qualify for public or private insurance (i.e. Medicaid, Medicare, [place] Health Choice, Veterans' Benefits) • Household income should be no more than 200% of the poverty level •The clinic cannot treat you if you are pregnant or think you are pregnant • Sexually transmitted diseases are not treated at the clinic.  ° ° °Dental Care: °Organization         Address  Phone  Notes  °[facility] [address],  (336) 641-6152 Accepts children up to age 21 who are enrolled in Medicaid or [place] Health Choice; pregnant women with a Medicaid card; and children who have applied for Medicaid or [place] Health Choice, but were declined, whose parents can pay a reduced fee at time of service.  °[facility] [place]  [address], [place] (336) 641-7733 Accepts children up to age 21 who are enrolled in Medicaid or [place] [facility]  Choice; pregnant women with a Medicaid card; and children who have applied for Medicaid or [place] Health Choice,   but were declined, whose parents can pay a reduced fee at time of service.  °[facility] ° [address], [place] (336) 641-4533 Patients are seen by appointment only. Walk-ins are not accepted. [facility] will see patients 18 years of age and older. °Monday - Tuesday (8am-5pm) °Most Wednesdays (8:30-5pm) °$30 per visit, cash only  °[facility] ° [address], [place] (336) [phone] Patients are seen by appointment only. Walk-ins are not accepted. [facility] will see patients 18 years of age and older. °One Wednesday Evening (Monthly: Volunteer Based).  $30 per visit, cash only  °[facility]  (919) [phone] for adults; Children under age 4, call Graduate Pediatric Dentistry at ([phone]) [phone]. Children aged 4-14, please call ([phone]) [phone] to request a pediatric application. ° Dental services are provided in all areas of dental care including fillings, crowns and bridges, complete and partial dentures, implants, gum treatment, root canals, and extractions. Preventive care is also provided. Treatment is provided to both adults and children. °Patients are selected via a lottery and there is often a waiting list. °  °[facility] [address], °[place] ° (336) 763-8833 [email or web address] °  °[facility] [address], [place], [place] (336)723-1848, Ext. 123 Second and Fourth Thursday of each month, opens at 6:30 AM; Clinic ends at 9 AM.  Patients are seen on a first-come first-served basis, and a limited number are seen during each clinic.  ° °[facility] ° [address], [place], [place] (336) 723-7904   Eligibility Requirements °You must have lived in [place], or [place] counties for at least the last three months. °  You cannot be eligible for state or  federal sponsored healthcare insurance, including Veterans Administration, Medicaid, or Medicare. °  You generally cannot be eligible for healthcare insurance through your employer.  °  How to apply: °Eligibility screenings are held every Tuesday and Wednesday afternoon from 1:00 pm until 4:00 pm. You do not need an appointment for the interview!  °[facility] [address], [place], [place] 336-631-2330   °[facility]  336-342-8273   °[facility]  336-703-3100   °[facility]  336-570-6415   ° °Behavioral Health Resources in the Community: °Intensive Outpatient Programs °Organization         Address  Phone  Notes  °[facility] [address], [place], [place] 336-878-6098   °[facility] [address], [place], [place] 336-832-9800   °[facility] [address], [place], [place] ° 336-882-2125   °[facility] [address],  °[place], [place] 1-800-853-5163 or 336-641-4981   °Substance Abuse Resources °Organization         Address  Phone  Notes  °[facility]  336-882-2125   °[facility]  336-784-9470   °[facility]  336-285-9073   °[facility]  336-845-3988   °Residential & Outpatient Substance Abuse Program  1-800-659-3381   °Psychological Services °Organization         Address  Phone  Notes  °[facility]  336- 832-9600   °[facility]  336- 378-7881   °[facility] [address], [place] 1-800-853-5163 or 336-641-4981   ° °Mobile Crisis Teams °Organization         Address  Phone  Notes  °[facility], Mobile Crisis Care Unit  1-877-626-1772   °Assertive °[facility] ° [address] [place], [place] 336-834-9664   °Sharon DeEsch [address] °[place] 336-554-5454   ° °Self-Help/Support Groups °Organization         Address  Phone              Notes  °[facility] - variety of support groups  336- 373-1402 Call for more information  °Narcotics Anonymous (NA), [facility] [address], °[place]  2 meetings at this location  ° °Residential Treatment Programs °Organization         Address  Phone  Notes  °[facility] [address],    °[place]  1-866-801-8205   °[facility] ° [address], [place], [place] 704-293-8524   °[facility] [address], [place] 336-845-3988 Admissions: 8am-3pm M-F  °[facility] [address]    °[place], [place] 336-841-1104   °[facility] [address], [place], [place] 336-379-7146   °[facility] [address]  °[place], [place] 336-285-9073   °[facility] Intensive Outpatient [address], [place], [place] 336-852-3033   °[facility] [address]  °[place], [place] 1-877-615-2722 or 336-784-9470   °[facility] [address] [place], [place] 336-227-7417 Accepts Medicaid  °[facility] [address]  °[place] 1-800-659-3381 Substance Abuse/Addiction Treatment  ° °[place] Behavioral Health Resources °Organization         Address  Phone  Notes  °[facility]  (888) 581-9988   °Julie Brannon, PhD [address] [place], [place]   (336) 349-5553 or (336) 951-0000   °[facility]   [address] °[place], [place] (336) 349-4454   °[facility] [address], [place], [place] (336) 342-8316 Insurance/Medicaid/sponsorship through [facility]  °[facility] [address]                                    [place], [place] (336) 342-8316 Therapy/tele-psych/case  °[facility] [address]  ° [place], [place] (336) 349-2233    °Dr. Arfeen  (336) 349-4544   °[facility] [place]  [employer] [place] [facility]  Dept. 1) [address], [place] °2) [address], [place] °3)  [address], [place] (336) 349-3220 °(336) 342-7768 ° °(336) 342-8140   °[place] [facility] (336) 342-1394 or (336) 342-3537 (After Hours)    ° ° ° °

## 2014-10-15 NOTE — ED Provider Notes (Addendum)
CSN: [id number]     Arrival date & time 10/15/14  1413 History   First MD Initiated Contact with Patient 10/15/14 1504     Chief Complaint  Patient presents with  . Chest Pain  . Arm Pain  . Shortness of Breath     (Consider location/radiation/quality/duration/timing/severity/associated sxs/prior Treatment) HPI Symptoms onset yesterday, waxing and waning. Left arm pain and numbness like a bad muscle cramp. Also some central/epigastric discomfort. Reports abdomen frequently bloated. No vomiting, mild nausea. Sometimes feels hard to catch breath with the pain. No cough or fever. Occurs at rest and not associated with exertion. No LE swelling or pain. Chiefly the left arm goes numb at times and also aches in the shoulder. The patient ports that she also gets some burning pain behind her left shoulder blade. The quality of her chest discomfort is more of a bloating sensation in the epigastrium which as been coming and going. Sometimes she feels nauseated in association with this. The symptoms do not appear to occur altogether as a constellation. Past Medical History  Diagnosis Date  . Glaucoma (increased eye pressure)   . Ovarian cyst, right 2009    s/p BSO 2009: OVARIAN FIBROTHECOMA in multiple fluid filled cysts  4.5cm region  . Atrophy of left kidney     Chronic atrophy of the left kidney.  . Chronic constipation   . COPD (chronic obstructive pulmonary disease)     Hyperinflation and heavy smoking history strongly  . HTN (hypertension) 04/12/2012  . Benign breast cysts in female 04/12/2012    Seen on mammography 2009   . UTI (lower urinary tract infection)     History of chronic recurrent UTIs  . Malnutrition 04/28/2012  . Ileus following gastrointestinal surgery 04/28/2012  . SBO (small bowel obstruction)   . Severe malnutrition 09/16/2013  . Ejection fraction    Past Surgical History  Procedure Laterality Date  . Bilateral oophorectomy  2009    PROCEDURE:  diagnostic laparoscopy,  laparotomy with bilateral salpingo-  . Laparotomy  04/23/2012    Procedure: EXPLORATORY LAPAROTOMY;  Surgeon: Valarie MerinoMatthew B Martin, MD;  Location: [facility];  Service: General;  Laterality: N/A;  enterolysis    Family History  Problem Relation Age of Onset  . Diabetes Son   . High blood pressure Son   . Coronary artery disease Son    History  Substance Use Topics  . Smoking status: Former Smoker -- 0.80 packs/day for 60 years    Types: Cigarettes    Quit date: 03/20/2014  . Smokeless tobacco: Never Used  . Alcohol Use: No   OB History    No data available     Review of Systems  10 Systems reviewed and are negative for acute change except as noted in the HPI.  Allergies  Review of patient's allergies indicates no known allergies.  Home Medications   Prior to Admission medications   Medication Sig Start Date End Date Taking? Authorizing Provider  aspirin 325 MG tablet Take 325 mg by mouth every morning.    Yes Historical Provider, MD  hydrochlorothiazide (MICROZIDE) 12.5 MG capsule Take 1 capsule (12.5 mg total) by mouth daily. 04/20/14  Yes Jerald KiefStephen K Chiu, MD  Travoprost, BAK Free, (TRAVATAN) 0.004 % SOLN ophthalmic solution Place 1 drop into both eyes at bedtime.    Yes Historical Provider, MD  omeprazole (PRILOSEC) 20 MG capsule Take 1 capsule (20 mg total) by mouth daily. 10/15/14   Arby BarretteMarcy Shantele Reller, MD  oxyCODONE-acetaminophen (PERCOCET/ROXICET) 5-325 MG  per tablet Take 1 tablet by mouth every 4 (four) hours as needed for severe pain. 04/20/14   Jerald Kief, MD  oxyCODONE-acetaminophen (PERCOCET/ROXICET) 5-325 MG per tablet Take 1 tablet by mouth every 8 (eight) hours as needed for severe pain. 10/15/14   Arby Barrette, MD  vitamin B-12 1000 MCG tablet Take 1 tablet (1,000 mcg total) by mouth daily. 09/18/13   Renae Fickle, MD   BP 154/89 mmHg  Pulse 63  Temp(Src) 98.3 F (36.8 C) (Oral)  Resp 12  SpO2 100% Physical Exam  Constitutional: She is oriented to person, place,  and time.  Cachectic, alert, nontoxic, no respiratory distress.  HENT:  Head: Normocephalic and atraumatic.  Eyes: EOM are normal. Pupils are equal, round, and reactive to light.  Neck: Neck supple.  Cardiovascular: Normal rate, regular rhythm, normal heart sounds and intact distal pulses.   Pulmonary/Chest: Effort normal and breath sounds normal.  Abdominal: Soft. Bowel sounds are normal. She exhibits distension. There is no tenderness.  Abdomen is protuberant but soft and nontender.  Musculoskeletal: Normal range of motion. She exhibits no edema or tenderness.  Neurological: She is alert and oriented to person, place, and time. She has normal strength. Coordination normal. GCS eye subscore is 4. GCS verbal subscore is 5. GCS motor subscore is 6.  Skin: Skin is warm, dry and intact.  Psychiatric: She has a normal mood and affect.    ED Course  Procedures (including critical care time) Labs Review Labs Reviewed  BASIC METABOLIC PANEL - Abnormal; Notable for the following:    GFR calc non Af Amer 50 (*)    GFR calc Af Amer 58 (*)    All other components within normal limits  HEPATIC FUNCTION PANEL - Abnormal; Notable for the following:    Alkaline Phosphatase 151 (*)    Indirect Bilirubin 0.2 (*)    All other components within normal limits  CBC  LIPASE, BLOOD  I-STAT TROPOININ, ED    Imaging Review Dg Abd Acute W/chest  10/15/2014   CLINICAL DATA:  Chest and abdominal pain.  EXAM: DG ABDOMEN ACUTE W/ 1V CHEST  COMPARISON:  PA and lateral chest 04/19/2014. Two views of the abdomen 04/25/2012. CT chest, abdomen and pelvis 04/12/2012 P  FINDINGS: Single view of the chest demonstrates clear lungs. The chest is hyperexpanded. There is cardiomegaly.  Two views of the abdomen show no free intraperitoneal air. The bowel gas pattern is nonobstructive. There is a calcification projecting just superior to the left SI joint correlating with a large left renal stone seen on the prior CT.  Scoliosis is noted  IMPRESSION: No acute abnormality.  Chronic left renal stone.   Electronically Signed   By: Drusilla Kanner M.D.   On: 10/15/2014 16:17     EKG Interpretation   Date/Time:  Monday October 15 2014 14:20:59 EDT Ventricular Rate:  82 PR Interval:  163 QRS Duration: 92 QT Interval:  407 QTC Calculation: 475 R Axis:   -51 Text Interpretation:  Sinus rhythm LVH with secondary repolarization  abnormality Probable inferior infarct, old Anterior infarct, old Baseline  wander in lead(s) V3 poor data quality Confirmed by KNAPP  MD-J, JON  ([id number]) on 10/15/2014 2:27:03 PM      MDM   Final diagnoses:  Other chest pain  Pain of left upper extremity  Cervical radiculopathy   Patient's had symptoms since yesterday. The cardiac enzymes are negative. Lipase and LFTs also are within normal limits. The patient's abdomen is soft  without any guarding in the epigastrium or right upper quadrant. Reviewing her prior medical record, the patient had a very similar presentation about the same time last year. At that time she did have a stress Myoview done which did not show any ischemic areas as well as a cardiac echo with good function. Days physical examination is for normal cardiac exam and clear lungs. She does not show any signs of respiratory distress. Her arm discomfort has a significant quality of radiculopathy as previously. She was identified to have significant degenerative disease considered to be the etiology of her left arm numbness and pain last year. At this time I do feel she is safe for discharge and have advised for follow-up this week with her family physician. The patient reports that she was assigned to a doctor in [place] and thus she is given [facility] to schedule follow-up for this week. The patient was given a prescription for Percocet to take for pain. She had been given this the last time she was treated and tolerated it well. She also has been given a  prescription for Prilosec based on some epigastric quality of discomfort that could be reflux or gastritis. At this time for that reason I have avoided NSAIDs.    Arby Barrette, MD 10/15/14 1749  Arby Barrette, MD 10/15/14 [phone]

## 2014-10-15 NOTE — ED Notes (Addendum)
Pt presents with c/o shortness of breath and left arm pain. Pt also c/o pain in the left part of her chest, tightness in nature. Pt reports she has had these symptoms since yesterday. Pt ambulatory to triage room.

## 2014-10-15 NOTE — Progress Notes (Signed)
Pt informed CM she did not have a pcp and that when she got sick she came to EDs. Female at bedside states pt has not pcp CM noted [place] that [facility]  EPIC updated Pt informed of her pcp and given this information in writing

## 2014-12-14 ENCOUNTER — Encounter (HOSPITAL_COMMUNITY): Payer: Self-pay | Admitting: Emergency Medicine

## 2014-12-14 ENCOUNTER — Emergency Department (HOSPITAL_COMMUNITY)
Admission: EM | Admit: 2014-12-14 | Discharge: 2014-12-14 | Discharge status: Against Medical Advice | Payer: Medicare Other | Attending: Emergency Medicine | Admitting: Emergency Medicine

## 2014-12-14 DIAGNOSIS — J449 Chronic obstructive pulmonary disease, unspecified: Secondary | ICD-10-CM | POA: Insufficient documentation

## 2014-12-14 DIAGNOSIS — R109 Unspecified abdominal pain: Secondary | ICD-10-CM | POA: Insufficient documentation

## 2014-12-14 DIAGNOSIS — I10 Essential (primary) hypertension: Secondary | ICD-10-CM | POA: Insufficient documentation

## 2014-12-14 LAB — COMPREHENSIVE METABOLIC PANEL
ALBUMIN: 3.9 g/dL (ref 3.5–5.0)
ALK PHOS: 123 U/L (ref 38–126)
ALT: 11 U/L — AB (ref 14–54)
AST: 23 U/L (ref 15–41)
Anion gap: 7 (ref 5–15)
BUN: 10 mg/dL (ref 6–20)
CHLORIDE: 100 mmol/L — AB (ref 101–111)
CO2: 31 mmol/L (ref 22–32)
Calcium: 9.6 mg/dL (ref 8.9–10.3)
Creatinine, Ser: 1.07 mg/dL — ABNORMAL HIGH (ref 0.44–1.00)
GFR calc Af Amer: 56 mL/min — ABNORMAL LOW (ref >60)
GFR calc non Af Amer: 48 mL/min — ABNORMAL LOW (ref >60)
GLUCOSE: 86 mg/dL (ref 65–99)
Potassium: 4.9 mmol/L (ref 3.5–5.1)
Sodium: 138 mmol/L (ref 135–145)
TOTAL PROTEIN: 7.1 g/dL (ref 6.5–8.1)
Total Bilirubin: 0.4 mg/dL (ref 0.3–1.2)

## 2014-12-14 LAB — CBC WITH DIFFERENTIAL/PLATELET
BASOS ABS: 0 10*3/uL (ref 0.0–0.1)
BASOS PCT: 1 % (ref 0–1)
EOS ABS: 0.2 10*3/uL (ref 0.0–0.7)
EOS PCT: 3 % (ref 0–5)
HCT: 40.4 % (ref 36.0–46.0)
HEMOGLOBIN: 12.5 g/dL (ref 12.0–15.0)
Lymphocytes Relative: 21 % (ref 12–46)
Lymphs Abs: 1.1 10*3/uL (ref 0.7–4.0)
MCH: 28.8 pg (ref 26.0–34.0)
MCHC: 30.9 g/dL (ref 30.0–36.0)
MCV: 93.1 fL (ref 78.0–100.0)
MONO ABS: 0.6 10*3/uL (ref 0.1–1.0)
MONOS PCT: 11 % (ref 3–12)
NEUTROS PCT: 64 % (ref 43–77)
Neutro Abs: 3.4 10*3/uL (ref 1.7–7.7)
Platelets: 183 10*3/uL (ref 150–400)
RBC: 4.34 MIL/uL (ref 3.87–5.11)
RDW: 13 % (ref 11.5–15.5)
WBC: 5.3 10*3/uL (ref 4.0–10.5)

## 2014-12-14 LAB — LIPASE, BLOOD: Lipase: 23 U/L (ref 22–51)

## 2014-12-14 NOTE — ED Notes (Signed)
Pt states that she has had mid abd pain x several months.  Denies NVD.

## 2014-12-14 NOTE — Progress Notes (Signed)
[facility] [address] [place] [address] [phone]

## 2014-12-29 ENCOUNTER — Emergency Department (HOSPITAL_COMMUNITY): Payer: Medicare Other

## 2014-12-29 ENCOUNTER — Emergency Department (HOSPITAL_COMMUNITY)
Admission: EM | Admit: 2014-12-29 | Discharge: 2014-12-29 | Disposition: A | Discharge status: Home / Self Care | Payer: Medicare Other | Attending: Emergency Medicine | Admitting: Emergency Medicine

## 2014-12-29 ENCOUNTER — Encounter (HOSPITAL_COMMUNITY): Payer: Self-pay | Admitting: *Deleted

## 2014-12-29 DIAGNOSIS — Z79899 Other long term (current) drug therapy: Secondary | ICD-10-CM | POA: Diagnosis not present

## 2014-12-29 DIAGNOSIS — Z7982 Long term (current) use of aspirin: Secondary | ICD-10-CM | POA: Insufficient documentation

## 2014-12-29 DIAGNOSIS — Z87448 Personal history of other diseases of urinary system: Secondary | ICD-10-CM | POA: Insufficient documentation

## 2014-12-29 DIAGNOSIS — Z87891 Personal history of nicotine dependence: Secondary | ICD-10-CM | POA: Insufficient documentation

## 2014-12-29 DIAGNOSIS — I10 Essential (primary) hypertension: Secondary | ICD-10-CM | POA: Diagnosis not present

## 2014-12-29 DIAGNOSIS — H409 Unspecified glaucoma: Secondary | ICD-10-CM | POA: Diagnosis not present

## 2014-12-29 DIAGNOSIS — Z8719 Personal history of other diseases of the digestive system: Secondary | ICD-10-CM | POA: Insufficient documentation

## 2014-12-29 DIAGNOSIS — Z8742 Personal history of other diseases of the female genital tract: Secondary | ICD-10-CM | POA: Diagnosis not present

## 2014-12-29 DIAGNOSIS — R079 Chest pain, unspecified: Secondary | ICD-10-CM

## 2014-12-29 DIAGNOSIS — J441 Chronic obstructive pulmonary disease with (acute) exacerbation: Secondary | ICD-10-CM | POA: Diagnosis not present

## 2014-12-29 DIAGNOSIS — E43 Unspecified severe protein-calorie malnutrition: Secondary | ICD-10-CM | POA: Insufficient documentation

## 2014-12-29 DIAGNOSIS — Z87440 Personal history of urinary (tract) infections: Secondary | ICD-10-CM | POA: Insufficient documentation

## 2014-12-29 DIAGNOSIS — R918 Other nonspecific abnormal finding of lung field: Secondary | ICD-10-CM | POA: Diagnosis not present

## 2014-12-29 LAB — CBC
HEMATOCRIT: 35.5 % — AB (ref 36.0–46.0)
Hemoglobin: 11.2 g/dL — ABNORMAL LOW (ref 12.0–15.0)
MCH: 29.7 pg (ref 26.0–34.0)
MCHC: 31.5 g/dL (ref 30.0–36.0)
MCV: 94.2 fL (ref 78.0–100.0)
Platelets: 180 10*3/uL (ref 150–400)
RBC: 3.77 MIL/uL — ABNORMAL LOW (ref 3.87–5.11)
RDW: 13 % (ref 11.5–15.5)
WBC: 4.7 10*3/uL (ref 4.0–10.5)

## 2014-12-29 LAB — COMPREHENSIVE METABOLIC PANEL
ALBUMIN: 3.5 g/dL (ref 3.5–5.0)
ALT: 12 U/L — ABNORMAL LOW (ref 14–54)
AST: 18 U/L (ref 15–41)
Alkaline Phosphatase: 96 U/L (ref 38–126)
Anion gap: 7 (ref 5–15)
BILIRUBIN TOTAL: 0.6 mg/dL (ref 0.3–1.2)
BUN: 10 mg/dL (ref 6–20)
CHLORIDE: 103 mmol/L (ref 101–111)
CO2: 27 mmol/L (ref 22–32)
CREATININE: 0.92 mg/dL (ref 0.44–1.00)
Calcium: 9.2 mg/dL (ref 8.9–10.3)
GFR calc Af Amer: >60 mL/min (ref >60)
GFR calc non Af Amer: 58 mL/min — ABNORMAL LOW (ref >60)
Glucose, Bld: 85 mg/dL (ref 65–99)
Potassium: 4.2 mmol/L (ref 3.5–5.1)
Sodium: 137 mmol/L (ref 135–145)
Total Protein: 6.5 g/dL (ref 6.5–8.1)

## 2014-12-29 LAB — I-STAT TROPONIN, ED
TROPONIN I, POC: 0.01 ng/mL (ref 0.00–0.08)
Troponin i, poc: 0 ng/mL (ref 0.00–0.08)

## 2014-12-29 MED ORDER — HYDROCHLOROTHIAZIDE 12.5 MG PO CAPS: 12.5000 mg | ORAL_CAPSULE | Freq: Every day | ORAL | Status: DC

## 2014-12-29 MED ORDER — IPRATROPIUM-ALBUTEROL 0.5-2.5 (3) MG/3ML IN SOLN
3.0000 mL | RESPIRATORY_TRACT | Status: DC
  Administered 2014-12-29 (×2): 3 mL via RESPIRATORY_TRACT
  Filled 2014-12-29 (×2): qty 3

## 2014-12-29 MED ORDER — IOHEXOL 350 MG/ML SOLN
100.0000 mL | Freq: Once | INTRAVENOUS | Status: AC | PRN
  Administered 2014-12-29: 100 mL via INTRAVENOUS

## 2014-12-29 MED ORDER — MORPHINE SULFATE 4 MG/ML IJ SOLN
4.0000 mg | Freq: Once | INTRAMUSCULAR | Status: AC
  Administered 2014-12-29: 4 mg via INTRAVENOUS
  Filled 2014-12-29: qty 1

## 2014-12-29 NOTE — Discharge Instructions (Signed)
Chest Pain (Nonspecific) °It is often hard to give a specific diagnosis for the cause of chest pain. There is always a chance that your pain could be related to something serious, such as a heart attack or a blood clot in the lungs. You need to follow up with your health care provider for further evaluation. °CAUSES  °· Heartburn. °· Pneumonia or bronchitis. °· Anxiety or stress. °· Inflammation around your heart (pericarditis) or lung (pleuritis or pleurisy). °· A blood clot in the lung. °· A collapsed lung (pneumothorax). It can develop suddenly on its own (spontaneous pneumothorax) or from trauma to the chest. °· Shingles infection (herpes zoster virus). °The chest wall is composed of bones, muscles, and cartilage. Any of these can be the source of the pain. °· The bones can be bruised by injury. °· The muscles or cartilage can be strained by coughing or overwork. °· The cartilage can be affected by inflammation and become sore (costochondritis). °DIAGNOSIS  °Lab tests or other studies may be needed to find the cause of your pain. Your health care provider may have you take a test called an ambulatory electrocardiogram (ECG). An ECG records your heartbeat patterns over a 24-hour period. You may also have other tests, such as: °· Transthoracic echocardiogram (TTE). During echocardiography, sound waves are used to evaluate how blood flows through your heart. °· Transesophageal echocardiogram (TEE). °· Cardiac monitoring. This allows your health care provider to monitor your heart rate and rhythm in real time. °· Holter monitor. This is a portable device that records your heartbeat and can help diagnose heart arrhythmias. It allows your health care provider to track your heart activity for several days, if needed. °· Stress tests by exercise or by giving medicine that makes the heart beat faster. °TREATMENT  °· Treatment depends on what may be causing your chest pain. Treatment may include: °¨ Acid blockers for  heartburn. °¨ Anti-inflammatory medicine. °¨ Pain medicine for inflammatory conditions. °¨ Antibiotics if an infection is present. °· You may be advised to change lifestyle habits. This includes stopping smoking and avoiding alcohol, caffeine, and chocolate. °· You may be advised to keep your head raised (elevated) when sleeping. This reduces the chance of acid going backward from your stomach into your esophagus. °Most of the time, nonspecific chest pain will improve within 2-3 days with rest and mild pain medicine.  °HOME CARE INSTRUCTIONS  °· If antibiotics were prescribed, take them as directed. Finish them even if you start to feel better. °· For the next few days, avoid physical activities that bring on chest pain. Continue physical activities as directed. °· Do not use any tobacco products, including cigarettes, chewing tobacco, or electronic cigarettes. °· Avoid drinking alcohol. °· Only take medicine as directed by your health care provider. °· Follow your health care provider's suggestions for further testing if your chest pain does not go away. °· Keep any follow-up appointments you made. If you do not go to an appointment, you could develop lasting (chronic) problems with pain. If there is any problem keeping an appointment, call to reschedule. °SEEK MEDICAL CARE IF:  °· Your chest pain does not go away, even after treatment. °· You have a rash with blisters on your chest. °· You have a fever. °SEEK IMMEDIATE MEDICAL CARE IF:  °· You have increased chest pain or pain that spreads to your arm, neck, jaw, back, or abdomen. °· You have shortness of breath. °· You have an increasing cough, or you cough   up blood. °· You have severe back or abdominal pain. °· You feel nauseous or vomit. °· You have severe weakness. °· You faint. °· You have chills. °This is an emergency. Do not wait to see if the pain will go away. Get medical help at once. Call your local emergency services (911 in [place] Do not drive  yourself to the hospital. °MAKE SURE YOU:  °· Understand these instructions. °· Will watch your condition. °· Will get help right away if you are not doing well or get worse. °Document Released: 03/18/2005 Document Revised: 06/13/2013 Document Reviewed: 01/12/2008 °ExitCare® Patient Information ©2015 [employer]. This information is not intended to replace advice given to you by your health care provider. Make sure you discuss any questions you have with your health care provider. ° ° °Emergency Department Resource Guide °1) Find a Doctor and Pay Out of Pocket °Although you won't have to find out who is covered by your insurance plan, it is a good idea to ask around and get recommendations. You will then need to call the office and see if the doctor you have chosen will accept you as a new patient and what types of options they offer for patients who are self-pay. Some doctors offer discounts or will set up payment plans for their patients who do not have insurance, but you will need to ask so you aren't surprised when you get to your appointment. ° °2) Contact Your Local Health Department °Not all health departments have doctors that can see patients for sick visits, but many do, so it is worth a call to see if yours does. If you don't know where your local health department is, you can check in your phone book. The CDC also has a tool to help you locate your state's health department, and many state websites also have listings of all of their local health departments. ° °3) Find a Walk-in Clinic °If your illness is not likely to be very severe or complicated, you may want to try a walk in clinic. These are popping up all over the country in pharmacies, drugstores, and shopping centers. They're usually staffed by nurse practitioners or physician assistants that have been trained to treat common illnesses and complaints. They're usually fairly quick and inexpensive. However, if you have serious medical issues or  chronic medical problems, these are probably not your best option. ° °No Primary Care Doctor: °- Call [facility] at  832-8000 - they can help you locate a primary care doctor that  accepts your insurance, provides certain services, etc. °- Physician Referral Service- 1-800-533-3463 ° °Chronic Pain Problems: °Organization         Address  Phone   Notes  °[facility]  (336) 297-2271 Patients need to be referred by their primary care doctor.  ° °Medication Assistance: °Organization         Address  Phone   Notes  °[place] [facility] [address] °[place], [place] [address] (336) 641-8030 --Must be a resident of [place] °-- Must have NO insurance coverage whatsoever (no Medicaid/ Medicare, etc.) °-- The pt. MUST have a primary care doctor that directs their care regularly and follows them in the community °  °[facility]  (866) 331-1348   °[employer]  (888) 892-1162   ° °Agencies that provide inexpensive medical care: °Organization         Address  Phone   Notes  °[facility]  (  336) 832-8035   °[facility]    (336) 832-7272   °[facility] [address] °[place], [place] [address] (336) 832-4777   °[facility] [address], °[place] (336) 271-4999   °[facility]    (336) 373-0678   °[facility]    (336) 272-1050   °[facility] ° [address], [place] Phone:  (336) 832-4444, Fax:  (336) 832-4440 Hours of Operation:  9 am - 6 pm, M-F.  Also accepts Medicaid/Medicare and self-pay.  °[facility] ° [address], [place] Phone: (336) [phone], Fax: (336) [phone]. Hours of Operation:  8:30 am - 5:30 pm, M-F.  Also accepts Medicaid and self-pay.  °[facility] [place] [address], [place] Phone: (336) 878-6027   °[facility] [address], [place], [place]  (336)723-1848, Ext. 123 Mondays & Thursdays: 7-9 AM.  First 15 patients are seen on a first come, first serve basis. °  ° °Medicaid-accepting [place] Providers: ° °Organization         Address  Phone   Notes  °[facility] [address], [place] (336) 641-2100 Also accepts self-pay patients.  °[facility] [address], [place] ° (336) 856-9996   °[facility] [address], [place] (336) 288-8857   °[facility] [address] [place] [address], [place] (336) 299-7000   °Veita Bland [address], [place]  ° (336) 373-1557 Only accepts [place] Access Medicaid patients after they have their name applied to their card.  ° °Self-Pay (no insurance) in [place]: ° °Organization         Address  Phone   Notes  °Sickle Cell Patients, [facility] [address], [place] (336) 832-1970   °[facility] [address], [place] (336) 832-4400   °[facility] ° [address], [place] (336) 992-4800   °[facility]/Dr. Osei-Bonsu ° [address] [place] [address], [place] or [address], [place] (336) [phone] Phone number for both [place] and [place] locations is the same.  °[facility] [address], [place] (336) 299-0000   °[facility] [address] [place] [address], [place] or [address] (336) 852-7530 °(336) 878-2260   °[facility] [address],  (336) 350-1642, phone; (336) 294-5005, fax Sees patients 1st and 3rd Saturday of every month.  Must not qualify for public or private insurance (i.e. Medicaid, Medicare, [place] Health Choice, Veterans' Benefits) • Household income should be no more than 200% of the poverty level •The clinic cannot treat you if you are pregnant or think you are pregnant • Sexually transmitted  diseases are not treated at the clinic.  ° ° °Dental Care: °Organization         Address  Phone  Notes  °[facility] [address],  (336) 641-6152 Accepts children up to age 21 who are enrolled in Medicaid or [place] Health Choice; pregnant women with a Medicaid card; and children who have applied for Medicaid or [place] Health Choice, but were declined, whose parents can pay a reduced fee at time of service.  °[facility] [place]    [address], [place] (336) 641-7733 Accepts children up to age 21 who are enrolled in Medicaid or [place] Health Choice; pregnant women with a Medicaid card; and children who have applied for Medicaid or [place] Health Choice, but were declined, whose parents can pay a reduced fee at time of service.  °[facility] ° [address], [place] (336) 641-4533 Patients are seen by appointment only. Walk-ins are not accepted. [facility] will see patients 18 years of age and older. °Monday - Tuesday (8am-5pm) °Most Wednesdays (8:30-5pm) °$30 per visit, cash only  °[facility] ° [address], [place] (336) 641-4533 Patients are seen by appointment only. Walk-ins are not accepted. [facility] will see patients 18 years of age and older. °One Wednesday Evening (Monthly: Volunteer Based).  $30 per visit, cash only  °[facility]  (919) 537-3737 for adults; Children under age 4, call Graduate Pediatric Dentistry at ([phone]) [phone]. Children aged 4-14, please call ([phone]) [phone] to request a pediatric application. ° Dental services are provided in all areas of dental care including fillings, crowns and bridges, complete and partial dentures, implants, gum treatment, root canals, and extractions. Preventive care is also provided. Treatment is provided to both adults and children. °Patients are selected via a  lottery and there is often a waiting list. °  °[facility] [address], °[place] ° (336) 763-8833 [email or web address] °  °[facility] [address], [place], [place] (336)723-1848, Ext. 123 Second and Fourth Thursday of each month, opens at 6:30 AM; Clinic ends at 9 AM.  Patients are seen on a first-come first-served basis, and a limited number are seen during each clinic.  ° °[facility] ° [address], [place], [place] (336) 723-7904   Eligibility Requirements °You must have lived in [place], [place], or [place] counties for at least the last three months. °  You cannot be eligible for state or federal sponsored healthcare insurance, including Veterans Administration, Medicaid, or Medicare. °  You generally cannot be eligible for healthcare insurance through your employer.  °  How to apply: °Eligibility screenings are held every Tuesday and Wednesday afternoon from 1:00 pm until 4:00 pm. You do not need an appointment for the interview!  °[facility] [address], [place], [place] 336-631-2330   °[facility]  336-342-8273   °[facility]  336-703-3100   °[facility]  336-570-6415   ° °Behavioral Health Resources in the Community: °Intensive Outpatient Programs °Organization         Address  Phone  Notes  °[facility] [address], [place], [place] 336-878-6098   °[facility] [address], [place], [place] 336-832-9800   °[facility] [address], [place], [place] ° 336-882-2125   °[facility] [address],  °[place], [place] 1-800-853-5163 or 336-641-4981   °Substance Abuse Resources °Organization         Address  Phone  Notes  °[facility]  336-882-2125   °[facility]  336-784-9470   °[facility]  336-285-9073   °[facility]  336-845-3988   °Residential &  Outpatient Substance Abuse Program  1-800-659-3381   °Psychological Services °Organization         Address  Phone  Notes  °[facility]  336- 832-9600   °  [facility]  336- 378-7881   °[facility] [address], [place] 1-800-853-5163 or 336-641-4981   ° °Mobile Crisis Teams °Organization         Address  Phone  Notes  °[facility], Mobile Crisis Care Unit  1-877-626-1772   °Assertive °[facility] ° [address] [place], [place] 336-834-9664   °Sharon DeEsch [address] °[place] 336-554-5454   ° °Self-Help/Support Groups °Organization         Address  Phone             Notes  °[facility] - variety of support groups  336- 373-1402 Call for more information  °Narcotics Anonymous (NA), [facility] [address], °[place]  2 meetings at this location  ° °Residential Treatment Programs °Organization         Address  Phone  Notes  °[facility] [address],    °[place]  1-866-801-8205   °[facility] ° [address], [place], [place] 704-293-8524   °[facility] [address], [place] 336-845-3988 Admissions: 8am-3pm M-F  °[facility] [address]    °[place], [place] 336-841-1104   °[facility] [address], [place], [place] 336-379-7146   °[facility] [address]  °[place], [place] 336-285-9073   °[facility] Intensive Outpatient [address], [place], [place] 336-852-3033   °[facility] [address]  °[place], [place] 1-877-615-2722 or 336-784-9470   °[facility] [address] [place], [place] 336-227-7417 Accepts Medicaid  °[facility] [address]  °[place] 1-800-659-3381 Substance Abuse/Addiction Treatment  ° °[place] Behavioral Health Resources °Organization          Address  Phone  Notes  °[facility]  (888) 581-9988   °Julie Brannon, PhD [address] [place], [place]   (336) 349-5553 or (336) 951-0000   °[facility]   [address] °[place], [place] (336) 349-4454   °[facility] [address], [place], [place] (336) 342-8316 Insurance/Medicaid/sponsorship through [facility]  °[facility] [address]                                    [place], [place] (336) 342-8316 Therapy/tele-psych/case  °[facility] [address]  ° [place], [place] (336) 349-2233    °Dr. Arfeen  (336) 349-4544   °[facility] [place]  [employer] [place] [facility] 1) [address], [place] °2) [address], [place] °3)  [address], [place] (336) 349-3220 °(336) 342-7768 ° °(336) 342-8140   °[place] [facility] (336) 342-1394 or (336) 342-3537 (After Hours)    ° ° ° °

## 2014-12-29 NOTE — ED Notes (Signed)
Pt reports hx of COPD, left sided chest pain starting 0730, pain 5/10. Pt reports chronic SOB, but SOB increased the last two days. Denies n/v/d.

## 2014-12-29 NOTE — ED Notes (Signed)
RRT  DEE AT BS

## 2014-12-29 NOTE — ED Notes (Signed)
MD at bedside. 

## 2014-12-29 NOTE — ED Notes (Signed)
RN starting IV 

## 2014-12-29 NOTE — ED Provider Notes (Addendum)
CSN: [id number]     Arrival date & time 12/29/14  1408 History   First MD Initiated Contact with Patient 12/29/14 1458     Chief Complaint  Patient presents with  . Chest Pain  . Shortness of Breath     (Consider location/radiation/quality/duration/timing/severity/associated sxs/prior Treatment) Patient is a 81 y.o. female presenting with chest pain and shortness of breath.  Chest Pain Pain location:  L chest Pain quality: aching   Pain radiates to:  L arm Pain radiates to the back: no   Pain severity:  Moderate Onset quality:  Gradual Duration:  8 hours Timing:  Constant Progression:  Unchanged Chronicity:  New Context: at rest   Relieved by:  Nothing Worsened by:  Nothing tried Associated symptoms: shortness of breath (chronic due to COPD, mildly worse today. Worse with activity)   Associated symptoms: no abdominal pain, no back pain, no fever and not vomiting   Shortness of Breath Associated symptoms: chest pain   Associated symptoms: no abdominal pain, no fever and no vomiting     Past Medical History  Diagnosis Date  . Glaucoma (increased eye pressure)   . Ovarian cyst, right 2009    s/p BSO 2009: OVARIAN FIBROTHECOMA in multiple fluid filled cysts  4.5cm region  . Atrophy of left kidney     Chronic atrophy of the left kidney.  . Chronic constipation   . COPD (chronic obstructive pulmonary disease)     Hyperinflation and heavy smoking history strongly  . HTN (hypertension) 04/12/2012  . Benign breast cysts in female 04/12/2012    Seen on mammography 2009   . UTI (lower urinary tract infection)     History of chronic recurrent UTIs  . Malnutrition 04/28/2012  . Ileus following gastrointestinal surgery 04/28/2012  . SBO (small bowel obstruction)   . Severe malnutrition 09/16/2013  . Ejection fraction    Past Surgical History  Procedure Laterality Date  . Bilateral oophorectomy  2009    PROCEDURE:  diagnostic laparoscopy, laparotomy with bilateral salpingo-  .  Laparotomy  04/23/2012    Procedure: EXPLORATORY LAPAROTOMY;  Surgeon: Valarie Merino, MD;  Location: [facility];  Service: General;  Laterality: N/A;  enterolysis    Family History  Problem Relation Age of Onset  . Diabetes Son   . High blood pressure Son   . Coronary artery disease Son    History  Substance Use Topics  . Smoking status: Former Smoker -- 0.80 packs/day for 60 years    Types: Cigarettes    Quit date: 03/20/2014  . Smokeless tobacco: Never Used  . Alcohol Use: No   OB History    No data available     Review of Systems  Constitutional: Negative for fever.  Respiratory: Positive for shortness of breath (chronic due to COPD, mildly worse today. Worse with activity).   Cardiovascular: Positive for chest pain.  Gastrointestinal: Negative for vomiting and abdominal pain.  Musculoskeletal: Negative for back pain.  All other systems reviewed and are negative.     Allergies  Review of patient's allergies indicates no known allergies.  Home Medications   Prior to Admission medications   Medication Sig Start Date End Date Taking? Authorizing Provider  aspirin 325 MG tablet Take 325 mg by mouth every morning.    Yes Historical Provider, MD  aspirin EC 81 MG tablet Take 162 mg by mouth once.   Yes Historical Provider, MD  hydrochlorothiazide (MICROZIDE) 12.5 MG capsule Take 1 capsule (12.5 mg total) by mouth  daily. 04/20/14  Yes Jerald KiefStephen K Chiu, MD  omeprazole (PRILOSEC) 20 MG capsule Take 1 capsule (20 mg total) by mouth daily. 10/15/14  Yes Arby BarretteMarcy Pfeiffer, MD  Travoprost, BAK Free, (TRAVATAN) 0.004 % SOLN ophthalmic solution Place 1 drop into both eyes at bedtime.    Yes Historical Provider, MD  vitamin B-12 1000 MCG tablet Take 1 tablet (1,000 mcg total) by mouth daily. 09/18/13  Yes Renae FickleMackenzie Short, MD  oxyCODONE-acetaminophen (PERCOCET/ROXICET) 5-325 MG per tablet Take 1 tablet by mouth every 8 (eight) hours as needed for severe pain. Patient not taking: Reported on  12/29/2014 10/15/14   Arby BarretteMarcy Pfeiffer, MD   BP 159/86 mmHg  Pulse 79  Temp(Src) 98 F (36.7 C) (Oral)  Resp 18  Ht 5' (1.524 m)  Wt 82 lb (37.195 kg)  BMI 16.01 kg/m2  SpO2 96% Physical Exam  Constitutional: She is oriented to person, place, and time. She appears well-developed and well-nourished. No distress.  HENT:  Head: Normocephalic and atraumatic.  Mouth/Throat: Oropharynx is clear and moist.  Eyes: EOM are normal. Pupils are equal, round, and reactive to light.  Neck: Normal range of motion. Neck supple.  Cardiovascular: Normal rate and regular rhythm.  Exam reveals no friction rub.   No murmur heard. Pulmonary/Chest: Effort normal and breath sounds normal. No respiratory distress. She has no wheezes. She has no rales. She exhibits no tenderness.  Abdominal: Soft. She exhibits no distension. There is no tenderness. There is no rebound.  Musculoskeletal: Normal range of motion. She exhibits no edema.  Neurological: She is alert and oriented to person, place, and time.  Skin: No rash noted. She is not diaphoretic.  Nursing note and vitals reviewed.   ED Course  Procedures (including critical care time) Labs Review Labs Reviewed  CBC - Abnormal; Notable for the following:    RBC 3.77 (*)    Hemoglobin 11.2 (*)    HCT 35.5 (*)    All other components within normal limits  COMPREHENSIVE METABOLIC PANEL  Rosezena SensorI-STAT TROPOININ, ED    Imaging Review Dg Chest 2 View  12/29/2014   CLINICAL DATA:  Acute chest pain.  EXAM: CHEST  2 VIEW  COMPARISON:  October 15, 2014.  FINDINGS: Stable cardiomegaly. No pneumothorax or significant pleural effusion is noted. Pectus excavatum deformity is noted. Right apical mass or density is noted which is increased compared to prior exam.  IMPRESSION: Increased right apical density is noted concerning for mass or neoplasm. CT scan of the chest is recommended for further evaluation.   Electronically Signed   By: Lupita RaiderJames  Green Jr, M.D.   On: 12/29/2014 15:27    Ct Angio Chest Pe W/cm &/or Wo Cm  12/29/2014   CLINICAL DATA:  Chest pain since 07/30 this morning. Chronic shortness of breath.  EXAM: CT ANGIOGRAPHY CHEST WITH CONTRAST  TECHNIQUE: Multidetector CT imaging of the chest was performed using the standard protocol during bolus administration of intravenous contrast. Multiplanar CT image reconstructions and MIPs were obtained to evaluate the vascular anatomy.  CONTRAST:  100mL OMNIPAQUE IOHEXOL 350 MG/ML SOLN  COMPARISON:  04/12/2012  FINDINGS: Chest wall: Severe pectus deformity with mass effect on the right ventricle and displacement of the heart to the left. No breast masses, supraclavicular or axillary adenopathy is identified. Scoliosis and degenerative changes involving the thoracic spine but no acute fracture or bone lesion.  Mediastinum: The heart is mildly enlarged. No pericardial effusion. No mediastinal or hilar mass or adenopathy.  The pulmonary arterial tree is  well opacified. No filling defects to suggest pulmonary embolism.  Stable dilatation of the esophagus.  Lungs/ pleura: Dense apical pleural and parenchymal scarring changes. No acute pulmonary findings. No pleural effusion. Stable 4.5 mm right lower lobe pulmonary nodule on image number 63. No worrisome pulmonary lesions.  Upper abdomen:  No significant findings.  Review of the MIP images confirms the above findings.  IMPRESSION: No CT findings for pulmonary embolism.  No acute pulmonary findings.  Severe pectus deformity and scoliosis.   Electronically Signed   By: Rudie Meyer M.D.   On: 12/29/2014 19:37     EKG Interpretation   Date/Time:  Saturday December 29 2014 14:13:55 EDT Ventricular Rate:  81 PR Interval:  164 QRS Duration: 93 QT Interval:  389 QTC Calculation: 451 R Axis:   -58 Text Interpretation:  Sinus rhythm Atrial premature complex LVH with  secondary repolarization abnormality Anterior Q waves, possibly due to LVH  No significant change since last tracing Confirmed  by Gwendolyn Grant  MD, Arlissa Monteverde  ([id number]) on 12/29/2014 3:02:29 PM      MDM   Final diagnoses:  Chest pain, unspecified chest pain type    81 year old female here with left-sided chest pain. Achy in the chest in the shoulder. Similar prior chest pain. She was admitted 8 months ago with similar chest pain. She had negative rule out that time. She had a stress test 15 months ago that was negative. Here today with about 8 hours of pain. States achy in the chest and radiates into the arm. She has some mild worsening shortness of breath, but she is chronically short of breath due to her COPD. No alleviating or exacerbating factors. She took aspirin at home. Here I can re-create the pain with forward pressing arm movements. No belly pain. Lungs are clear. With recent admissions for similar chest pain, and with character of pain being achy, I doubt this is ACS. Will do serial troponins. Serial troponins negative. CXR shows possible RUL mass - PE study done to look for another cause of the chest pain. PE study negative. No masses noted.  Feeling better, stable for discharge.  Elwin Mocha, MD 12/29/14 1610  Elwin Mocha, MD 12/29/14 [phone]

## 2015-02-11 ENCOUNTER — Encounter (HOSPITAL_COMMUNITY): Payer: Self-pay | Admitting: *Deleted

## 2015-02-11 ENCOUNTER — Observation Stay (HOSPITAL_COMMUNITY)
Admission: EM | Admit: 2015-02-11 | Discharge: 2015-02-14 | Disposition: A | Discharge status: Home / Self Care | Payer: Medicare Other | Attending: Internal Medicine | Admitting: Internal Medicine

## 2015-02-11 ENCOUNTER — Emergency Department (HOSPITAL_COMMUNITY): Payer: Medicare Other

## 2015-02-11 DIAGNOSIS — R079 Chest pain, unspecified: Principal | ICD-10-CM | POA: Diagnosis present

## 2015-02-11 DIAGNOSIS — I129 Hypertensive chronic kidney disease with stage 1 through stage 4 chronic kidney disease, or unspecified chronic kidney disease: Secondary | ICD-10-CM | POA: Insufficient documentation

## 2015-02-11 DIAGNOSIS — I4891 Unspecified atrial fibrillation: Secondary | ICD-10-CM | POA: Diagnosis not present

## 2015-02-11 DIAGNOSIS — R911 Solitary pulmonary nodule: Secondary | ICD-10-CM | POA: Diagnosis not present

## 2015-02-11 DIAGNOSIS — J984 Other disorders of lung: Secondary | ICD-10-CM | POA: Diagnosis not present

## 2015-02-11 DIAGNOSIS — K5909 Other constipation: Secondary | ICD-10-CM | POA: Diagnosis present

## 2015-02-11 DIAGNOSIS — I240 Acute coronary thrombosis not resulting in myocardial infarction: Secondary | ICD-10-CM | POA: Insufficient documentation

## 2015-02-11 DIAGNOSIS — R1310 Dysphagia, unspecified: Secondary | ICD-10-CM | POA: Insufficient documentation

## 2015-02-11 DIAGNOSIS — R64 Cachexia: Secondary | ICD-10-CM | POA: Insufficient documentation

## 2015-02-11 DIAGNOSIS — N179 Acute kidney failure, unspecified: Secondary | ICD-10-CM | POA: Diagnosis not present

## 2015-02-11 DIAGNOSIS — N183 Chronic kidney disease, stage 3 unspecified: Secondary | ICD-10-CM | POA: Diagnosis present

## 2015-02-11 DIAGNOSIS — F17210 Nicotine dependence, cigarettes, uncomplicated: Secondary | ICD-10-CM | POA: Insufficient documentation

## 2015-02-11 DIAGNOSIS — J449 Chronic obstructive pulmonary disease, unspecified: Secondary | ICD-10-CM | POA: Insufficient documentation

## 2015-02-11 DIAGNOSIS — R4702 Dysphasia: Secondary | ICD-10-CM | POA: Diagnosis not present

## 2015-02-11 DIAGNOSIS — K224 Dyskinesia of esophagus: Secondary | ICD-10-CM | POA: Diagnosis present

## 2015-02-11 DIAGNOSIS — L89899 Pressure ulcer of other site, unspecified stage: Secondary | ICD-10-CM | POA: Diagnosis not present

## 2015-02-11 DIAGNOSIS — I10 Essential (primary) hypertension: Secondary | ICD-10-CM

## 2015-02-11 DIAGNOSIS — R918 Other nonspecific abnormal finding of lung field: Secondary | ICD-10-CM | POA: Diagnosis not present

## 2015-02-11 DIAGNOSIS — I701 Atherosclerosis of renal artery: Secondary | ICD-10-CM | POA: Diagnosis not present

## 2015-02-11 DIAGNOSIS — R140 Abdominal distension (gaseous): Secondary | ICD-10-CM | POA: Diagnosis not present

## 2015-02-11 DIAGNOSIS — K869 Disease of pancreas, unspecified: Secondary | ICD-10-CM | POA: Diagnosis not present

## 2015-02-11 DIAGNOSIS — M546 Pain in thoracic spine: Secondary | ICD-10-CM | POA: Insufficient documentation

## 2015-02-11 DIAGNOSIS — Z720 Tobacco use: Secondary | ICD-10-CM | POA: Diagnosis not present

## 2015-02-11 DIAGNOSIS — I700 Atherosclerosis of aorta: Secondary | ICD-10-CM | POA: Diagnosis present

## 2015-02-11 DIAGNOSIS — E43 Unspecified severe protein-calorie malnutrition: Secondary | ICD-10-CM | POA: Diagnosis present

## 2015-02-11 DIAGNOSIS — Z7982 Long term (current) use of aspirin: Secondary | ICD-10-CM | POA: Diagnosis not present

## 2015-02-11 DIAGNOSIS — R071 Chest pain on breathing: Secondary | ICD-10-CM | POA: Diagnosis not present

## 2015-02-11 DIAGNOSIS — H409 Unspecified glaucoma: Secondary | ICD-10-CM | POA: Insufficient documentation

## 2015-02-11 DIAGNOSIS — Z681 Body mass index (BMI) 19 or less, adult: Secondary | ICD-10-CM | POA: Diagnosis not present

## 2015-02-11 DIAGNOSIS — R0789 Other chest pain: Secondary | ICD-10-CM | POA: Diagnosis present

## 2015-02-11 DIAGNOSIS — M549 Dorsalgia, unspecified: Secondary | ICD-10-CM

## 2015-02-11 DIAGNOSIS — K5900 Constipation, unspecified: Secondary | ICD-10-CM | POA: Diagnosis not present

## 2015-02-11 DIAGNOSIS — L8990 Pressure ulcer of unspecified site, unspecified stage: Secondary | ICD-10-CM | POA: Diagnosis present

## 2015-02-11 DIAGNOSIS — K8689 Other specified diseases of pancreas: Secondary | ICD-10-CM | POA: Diagnosis present

## 2015-02-11 HISTORY — DX: Dyskinesia of esophagus: K22.4

## 2015-02-11 HISTORY — DX: Chronic kidney disease, stage 3 (moderate): N18.3

## 2015-02-11 LAB — TSH: TSH: 5.16 u[IU]/mL — ABNORMAL HIGH (ref 0.350–4.500)

## 2015-02-11 LAB — CBC WITH DIFFERENTIAL/PLATELET
BASOS PCT: 0 % (ref 0–1)
Basophils Absolute: 0 10*3/uL (ref 0.0–0.1)
Basophils Absolute: 0 10*3/uL (ref 0.0–0.1)
Basophils Relative: 0 % (ref 0–1)
EOS ABS: 0.2 10*3/uL (ref 0.0–0.7)
EOS PCT: 3 % (ref 0–5)
Eosinophils Absolute: 0.1 10*3/uL (ref 0.0–0.7)
Eosinophils Relative: 3 % (ref 0–5)
HCT: 40 % (ref 36.0–46.0)
HEMATOCRIT: 40.9 % (ref 36.0–46.0)
HEMOGLOBIN: 12.4 g/dL (ref 12.0–15.0)
Hemoglobin: 13 g/dL (ref 12.0–15.0)
LYMPHS PCT: 22 % (ref 12–46)
Lymphocytes Relative: 29 % (ref 12–46)
Lymphs Abs: 1.1 10*3/uL (ref 0.7–4.0)
Lymphs Abs: 1.4 10*3/uL (ref 0.7–4.0)
MCH: 29.8 pg (ref 26.0–34.0)
MCH: 28.8 pg (ref 26.0–34.0)
MCHC: 31.8 g/dL (ref 30.0–36.0)
MCHC: 31 g/dL (ref 30.0–36.0)
MCV: 93.8 fL (ref 78.0–100.0)
MCV: 92.8 fL (ref 78.0–100.0)
MONO ABS: 0.3 10*3/uL (ref 0.1–1.0)
Monocytes Absolute: 0.3 10*3/uL (ref 0.1–1.0)
Monocytes Relative: 7 % (ref 3–12)
Monocytes Relative: 6 % (ref 3–12)
NEUTROS PCT: 62 % (ref 43–77)
Neutro Abs: 3.3 10*3/uL (ref 1.7–7.7)
Neutro Abs: 2.9 10*3/uL (ref 1.7–7.7)
Neutrophils Relative %: 68 % (ref 43–77)
PLATELETS: 198 10*3/uL (ref 150–400)
PLATELETS: 179 10*3/uL (ref 150–400)
RBC: 4.36 MIL/uL (ref 3.87–5.11)
RBC: 4.31 MIL/uL (ref 3.87–5.11)
RDW: 13.7 % (ref 11.5–15.5)
RDW: 13.6 % (ref 11.5–15.5)
WBC: 4.8 10*3/uL (ref 4.0–10.5)
WBC: 4.7 10*3/uL (ref 4.0–10.5)

## 2015-02-11 LAB — LIPID PANEL
CHOL/HDL RATIO: 3 ratio
CHOLESTEROL: 184 mg/dL (ref 0–200)
HDL: 62 mg/dL (ref >40)
LDL Cholesterol: 108 mg/dL — ABNORMAL HIGH (ref 0–99)
TRIGLYCERIDES: 70 mg/dL (ref <150)
VLDL: 14 mg/dL (ref 0–40)

## 2015-02-11 LAB — TROPONIN I
Troponin I: <0.03 ng/mL (ref <0.031)
Troponin I: <0.03 ng/mL (ref <0.031)

## 2015-02-11 LAB — COMPREHENSIVE METABOLIC PANEL
ALK PHOS: 91 U/L (ref 38–126)
ALT: 12 U/L — AB (ref 14–54)
AST: 22 U/L (ref 15–41)
Albumin: 3.6 g/dL (ref 3.5–5.0)
Anion gap: 7 (ref 5–15)
BILIRUBIN TOTAL: 0.3 mg/dL (ref 0.3–1.2)
BUN: 14 mg/dL (ref 6–20)
CALCIUM: 9.5 mg/dL (ref 8.9–10.3)
CO2: 30 mmol/L (ref 22–32)
CREATININE: 1.08 mg/dL — AB (ref 0.44–1.00)
Chloride: 102 mmol/L (ref 101–111)
GFR, EST AFRICAN AMERICAN: 55 mL/min — AB (ref >60)
GFR, EST NON AFRICAN AMERICAN: 48 mL/min — AB (ref >60)
Glucose, Bld: 136 mg/dL — ABNORMAL HIGH (ref 65–99)
Potassium: 4.3 mmol/L (ref 3.5–5.1)
Sodium: 139 mmol/L (ref 135–145)
Total Protein: 6.8 g/dL (ref 6.5–8.1)

## 2015-02-11 LAB — LIPASE, BLOOD: LIPASE: 30 U/L (ref 22–51)

## 2015-02-11 LAB — MAGNESIUM: Magnesium: 2 mg/dL (ref 1.7–2.4)

## 2015-02-11 LAB — BASIC METABOLIC PANEL
ANION GAP: 5 (ref 5–15)
BUN: 16 mg/dL (ref 6–20)
CALCIUM: 9.7 mg/dL (ref 8.9–10.3)
CO2: 30 mmol/L (ref 22–32)
Chloride: 103 mmol/L (ref 101–111)
Creatinine, Ser: 1.14 mg/dL — ABNORMAL HIGH (ref 0.44–1.00)
GFR calc Af Amer: 52 mL/min — ABNORMAL LOW (ref >60)
GFR, EST NON AFRICAN AMERICAN: 45 mL/min — AB (ref >60)
Glucose, Bld: 85 mg/dL (ref 65–99)
POTASSIUM: 4.7 mmol/L (ref 3.5–5.1)
SODIUM: 138 mmol/L (ref 135–145)

## 2015-02-11 LAB — PROTIME-INR
INR: 1.08 (ref 0.00–1.49)
PROTHROMBIN TIME: 14.2 s (ref 11.6–15.2)

## 2015-02-11 LAB — APTT: aPTT: 35 seconds (ref 24–37)

## 2015-02-11 LAB — BRAIN NATRIURETIC PEPTIDE: B Natriuretic Peptide: 121.8 pg/mL — ABNORMAL HIGH (ref 0.0–100.0)

## 2015-02-11 LAB — PHOSPHORUS: PHOSPHORUS: 3.3 mg/dL (ref 2.5–4.6)

## 2015-02-11 MED ORDER — SODIUM CHLORIDE 0.9 % IJ SOLN
3.0000 mL | Freq: Two times a day (BID) | INTRAMUSCULAR | Status: DC
Start: 2015-02-11 — End: 2015-02-14
  Administered 2015-02-13 – 2015-02-14 (×3): 3 mL via INTRAVENOUS

## 2015-02-11 MED ORDER — LATANOPROST 0.005 % OP SOLN
1.0000 [drp] | Freq: Every day | OPHTHALMIC | Status: DC
  Administered 2015-02-11 – 2015-02-13 (×3): 1 [drp] via OPHTHALMIC
  Filled 2015-02-11: qty 2.5

## 2015-02-11 MED ORDER — IOHEXOL 350 MG/ML SOLN
100.0000 mL | Freq: Once | INTRAVENOUS | Status: AC | PRN
  Administered 2015-02-11: 80 mL via INTRAVENOUS

## 2015-02-11 MED ORDER — MORPHINE SULFATE (PF) 2 MG/ML IV SOLN
2.0000 mg | Freq: Once | INTRAVENOUS | Status: AC
  Administered 2015-02-11: 2 mg via INTRAVENOUS
  Filled 2015-02-11: qty 1

## 2015-02-11 MED ORDER — SODIUM CHLORIDE 0.9 % IV BOLUS (SEPSIS)
500.0000 mL | Freq: Once | INTRAVENOUS | Status: AC
  Administered 2015-02-11: 1000 mL via INTRAVENOUS

## 2015-02-11 MED ORDER — VITAMIN B-12 1000 MCG PO TABS
1000.0000 ug | ORAL_TABLET | Freq: Every day | ORAL | Status: DC
  Administered 2015-02-11 – 2015-02-14 (×4): 1000 ug via ORAL
  Filled 2015-02-11 (×4): qty 1

## 2015-02-11 MED ORDER — HYDRALAZINE HCL 20 MG/ML IJ SOLN
10.0000 mg | Freq: Four times a day (QID) | INTRAMUSCULAR | Status: DC | PRN
  Administered 2015-02-11 – 2015-02-14 (×2): 10 mg via INTRAVENOUS
  Filled 2015-02-11 (×2): qty 1

## 2015-02-11 MED ORDER — ACETAMINOPHEN 325 MG PO TABS
650.0000 mg | ORAL_TABLET | Freq: Four times a day (QID) | ORAL | Status: DC | PRN
  Administered 2015-02-11 – 2015-02-14 (×5): 650 mg via ORAL
  Filled 2015-02-11 (×6): qty 2

## 2015-02-11 MED ORDER — ASPIRIN EC 81 MG PO TBEC
81.0000 mg | DELAYED_RELEASE_TABLET | Freq: Every day | ORAL | Status: DC
  Administered 2015-02-12 – 2015-02-14 (×3): 81 mg via ORAL
  Filled 2015-02-11 (×3): qty 1

## 2015-02-11 MED ORDER — ENSURE ENLIVE PO LIQD
237.0000 mL | Freq: Two times a day (BID) | ORAL | Status: DC
  Administered 2015-02-11 – 2015-02-14 (×5): 237 mL via ORAL

## 2015-02-11 MED ORDER — ACETAMINOPHEN 650 MG RE SUPP: 650.0000 mg | Freq: Four times a day (QID) | RECTAL | Status: DC | PRN

## 2015-02-11 MED ORDER — ONDANSETRON HCL 4 MG PO TABS: 4.0000 mg | ORAL_TABLET | Freq: Four times a day (QID) | ORAL | Status: DC | PRN

## 2015-02-11 MED ORDER — ASPIRIN EC 81 MG PO TBEC: 162.0000 mg | DELAYED_RELEASE_TABLET | Freq: Every day | ORAL | Status: DC | PRN

## 2015-02-11 MED ORDER — ASPIRIN 81 MG PO CHEW
324.0000 mg | CHEWABLE_TABLET | Freq: Once | ORAL | Status: AC
  Administered 2015-02-11: 324 mg via ORAL
  Filled 2015-02-11: qty 4

## 2015-02-11 MED ORDER — MORPHINE SULFATE (PF) 2 MG/ML IV SOLN
2.0000 mg | INTRAVENOUS | Status: AC | PRN
  Administered 2015-02-11 – 2015-02-12 (×3): 2 mg via INTRAVENOUS
  Filled 2015-02-11 (×3): qty 1

## 2015-02-11 MED ORDER — SODIUM CHLORIDE 0.9 % IV SOLN
INTRAVENOUS | Status: DC
  Administered 2015-02-11 – 2015-02-12 (×2): via INTRAVENOUS

## 2015-02-11 MED ORDER — ONDANSETRON HCL 4 MG/2ML IJ SOLN: 4.0000 mg | Freq: Four times a day (QID) | INTRAMUSCULAR | Status: DC | PRN

## 2015-02-11 NOTE — ED Provider Notes (Signed)
CSN: [id number]     Arrival date & time 02/11/15  0904 History   First MD Initiated Contact with Patient 02/11/15 [id number]     Chief Complaint  Patient presents with  . Chest Pain     (Consider location/radiation/quality/duration/timing/severity/associated sxs/prior Treatment) HPI Comments: 81 year old female malnutrition, urine infection, COPD, tobacco abuse, bowel obstruction, ileus history presents with worsening chest and upper back pain since yesterday. Gradually worsening fairly constant. No specific worsening component. Patient feels pain is worse between the shoulder blades. Patient has had mild similar in the past however she is concerned that it was persistent. No cardiac history known. Ache sensation mild/moderate.  Patient is a 81 y.o. female presenting with chest pain. The history is provided by the patient.  Chest Pain Associated symptoms: diaphoresis   Associated symptoms: no abdominal pain, no back pain, no fever, no headache, no shortness of breath and not vomiting     Past Medical History  Diagnosis Date  . Glaucoma (increased eye pressure)   . Ovarian cyst, right 2009    s/p BSO 2009: OVARIAN FIBROTHECOMA in multiple fluid filled cysts  4.5cm region  . Atrophy of left kidney     Chronic atrophy of the left kidney.  . Chronic constipation   . COPD (chronic obstructive pulmonary disease)     Hyperinflation and heavy smoking history strongly  . HTN (hypertension) 04/12/2012  . Benign breast cysts in female 04/12/2012    Seen on mammography 2009   . UTI (lower urinary tract infection)     History of chronic recurrent UTIs  . Malnutrition 04/28/2012  . Ileus following gastrointestinal surgery 04/28/2012  . SBO (small bowel obstruction)   . Severe malnutrition 09/16/2013  . Ejection fraction    Past Surgical History  Procedure Laterality Date  . Bilateral oophorectomy  2009    PROCEDURE:  diagnostic laparoscopy, laparotomy with bilateral salpingo-  . Laparotomy   04/23/2012    Procedure: EXPLORATORY LAPAROTOMY;  Surgeon: Valarie Merino, MD;  Location: [facility];  Service: General;  Laterality: N/A;  enterolysis    Family History  Problem Relation Age of Onset  . Diabetes Son   . High blood pressure Son   . Coronary artery disease Son    Social History  Substance Use Topics  . Smoking status: Former Smoker -- 0.80 packs/day for 60 years    Types: Cigarettes    Quit date: 03/20/2014  . Smokeless tobacco: Never Used  . Alcohol Use: No   OB History    No data available     Review of Systems  Constitutional: Positive for diaphoresis and appetite change. Negative for fever and chills.  HENT: Negative for congestion.   Eyes: Negative for visual disturbance.  Respiratory: Negative for shortness of breath.   Cardiovascular: Positive for chest pain.  Gastrointestinal: Negative for vomiting and abdominal pain.  Genitourinary: Negative for dysuria and flank pain.  Musculoskeletal: Negative for back pain, neck pain and neck stiffness.  Skin: Negative for rash.  Neurological: Negative for light-headedness and headaches.      Allergies  Review of patient's allergies indicates no known allergies.  Home Medications   Prior to Admission medications   Medication Sig Start Date End Date Taking? Authorizing Provider  aspirin 325 MG tablet Take 325 mg by mouth every morning.    Yes Historical Provider, MD  aspirin EC 81 MG tablet Take 162 mg by mouth daily as needed for mild pain or moderate pain.    Yes Historical Provider,  MD  hydrochlorothiazide (MICROZIDE) 12.5 MG capsule Take 1 capsule (12.5 mg total) by mouth daily. 12/29/14  Yes Elwin Mocha, MD  Travoprost, BAK Free, (TRAVATAN) 0.004 % SOLN ophthalmic solution Place 1 drop into both eyes at bedtime.    Yes Historical Provider, MD  vitamin B-12 1000 MCG tablet Take 1 tablet (1,000 mcg total) by mouth daily. 09/18/13  Yes Renae Fickle, MD  omeprazole (PRILOSEC) 20 MG capsule Take 1 capsule (20  mg total) by mouth daily. Patient not taking: Reported on 02/11/2015 10/15/14   Arby Barrette, MD  oxyCODONE-acetaminophen (PERCOCET/ROXICET) 5-325 MG per tablet Take 1 tablet by mouth every 8 (eight) hours as needed for severe pain. Patient not taking: Reported on 12/29/2014 10/15/14   Arby Barrette, MD   BP 137/70 mmHg  Pulse 50  Temp(Src) 98.2 F (36.8 C) (Oral)  Resp 22  SpO2 99% Physical Exam  Constitutional: She is oriented to person, place, and time. She appears well-developed. No distress.  HENT:  Head: Normocephalic and atraumatic.  Eyes: Conjunctivae are normal. Right eye exhibits no discharge. Left eye exhibits no discharge.  Neck: Normal range of motion. Neck supple. No tracheal deviation present.  Cardiovascular: Normal rate, regular rhythm and intact distal pulses.   Pulmonary/Chest: Effort normal and breath sounds normal.  Abdominal: Soft. She exhibits no distension. There is no tenderness. There is no guarding.  Musculoskeletal: She exhibits no edema.  Neurological: She is alert and oriented to person, place, and time.  Skin: Skin is warm. No rash noted.  Psychiatric: She has a normal mood and affect.  Nursing note and vitals reviewed.   ED Course  Procedures (including critical care time) Labs Review Labs Reviewed  BASIC METABOLIC PANEL - Abnormal; Notable for the following:    Creatinine, Ser 1.14 (*)    GFR calc non Af Amer 45 (*)    GFR calc Af Amer 52 (*)    All other components within normal limits  CBC WITH DIFFERENTIAL/PLATELET  TROPONIN I  LIPASE, BLOOD  TROPONIN I    Imaging Review Ct Angio Chest Aorta W/cm &/or Wo/cm  02/11/2015   CLINICAL DATA:  81 year old with upper back pain that started yesterday. Chest pain started this morning. Chronic shortness of breath. History of tobacco use.  EXAM: CT ANGIOGRAPHY CHEST, ABDOMEN AND PELVIS  TECHNIQUE: Multidetector CT imaging through the chest, abdomen and pelvis was performed using the standard protocol  during bolus administration of intravenous contrast. Multiplanar reconstructed images and MIPs were obtained and reviewed to evaluate the vascular anatomy.  CONTRAST:  80mL OMNIPAQUE IOHEXOL 350 MG/ML SOLN  COMPARISON:  04/12/2012  FINDINGS: CTA CHEST FINDINGS  No evidence for a thoracic aortic dissection. The mid ascending thoracic aorta measures 3.3 cm and stable. Great vessels are patent. There is no significant atherosclerotic disease involving the aortic arch or ascending thoracic aorta. There is a mild atherosclerotic disease in the descending thoracic aorta. Mid descending thoracic aorta measures 2.3 cm in diameter. Pulmonary arteries are well opacified on this examination and no evidence for pulmonary embolism.  Again noted is a pectus excavatum deformity. There is no significant chest lymphadenopathy. No significant pericardial or pleural fluid.  The trachea and mainstem bronchi are patent. There is chronic dilatation of the esophagus containing gas. Chronic scarring along the lung apices. Stable nodule in the right lower lobe on sequence 8, image 38 measures roughly 5 mm. This has not significantly change since 04/12/2012. There is a new small nodular density in the right upper lobe  on sequence 8, image 15. This nodule roughly measures 4 mm. No evidence for airspace disease or consolidation. There is dextroscoliosis in the lower thoracic spine.  Review of the MIP images confirms the above findings.  CTA ABDOMEN AND PELVIS FINDINGS  Mild atherosclerotic disease involving the abdominal aorta without aneurysm. There is a focal wall calcification along the posterior infrarenal abdominal aorta near the 8 o'clock position. There is concern for a small ulcerated plaque or even a small penetrating ulcer in this area, measuring roughly 5 mm. No evidence for an aorta dissection. The celiac trunk and SMA are patent. Right renal artery is patent. There is a high-grade stenosis involving the proximal left renal artery.  Chronic atrophy involving the left kidney. Inferior mesenteric artery is patent. The common, internal and external iliac arteries are patent bilaterally. The proximal femoral arteries are patent bilaterally.  Evaluation of the solid organs is somewhat limited due to the arterial phase of contrast. No gross abnormality to the liver, gallbladder or spleen. Again noted is mild dilatation of main pancreatic duct. There is a 4 mm low-density structure in the distal pancreatic body/tail region on sequence 6, image 106. Unclear if this was present on the previous examination. Cysts in the right kidney. Normal appearance of the right kidney. Limited evaluation of the adrenal glands. Chronic atrophy in the left kidney with a large stone along the lower pole measuring up to 13 mm. No significant hydronephrosis. The lack of intra-abdominal fat also limits evaluation of the intra-abdominal structures. There is no significant free fluid or lymphadenopathy. Limited evaluation of the uterus. There is moderate amount of fluid in the urinary bladder. High-density ingested tablet in the anterior abdomen on sequence 6, image 135. No acute bone abnormality. Dextroscoliosis of the lower thoracic spine. Multilevel degenerative disc disease.  Review of the MIP images confirms the above findings.  IMPRESSION: Negative for aortic dissection or aneurysm.  Small focus of irregularity along the infrarenal abdominal aorta that could represent a small ulcerated plaque or small penetrating ulcer. If this is thought to localize with the patient's symptoms, consider vascular surgery consultation. If this area is felt to be asymptomatic, recommend a follow-up CTA in 12 months.  Chronic severe stenosis involving the left renal artery with chronic left renal atrophy.  Stable 5 mm pulmonary nodule in the right lower lobe. Concern for a new 4 mm nodule in the right upper lobe. Given risk factors for bronchogenic carcinoma, follow-up chest CT at 1 year  is recommended. This recommendation follows the consensus statement: Guidelines for Management of Small Pulmonary Nodules Detected on CT Scans: A Statement from the Fleischner Society as published in Radiology 2005; 237:395-400.  Indeterminate 4 mm low-density structure in the distal pancreatic body region. Recommend attention this area on follow up imaging.   Electronically Signed   By: Richarda Overlie M.D.   On: 02/11/2015 12:48   Ct Angio Abd/pel W/ And/or W/o  02/11/2015   CLINICAL DATA:  81 year old with upper back pain that started yesterday. Chest pain started this morning. Chronic shortness of breath. History of tobacco use.  EXAM: CT ANGIOGRAPHY CHEST, ABDOMEN AND PELVIS  TECHNIQUE: Multidetector CT imaging through the chest, abdomen and pelvis was performed using the standard protocol during bolus administration of intravenous contrast. Multiplanar reconstructed images and MIPs were obtained and reviewed to evaluate the vascular anatomy.  CONTRAST:  80mL OMNIPAQUE IOHEXOL 350 MG/ML SOLN  COMPARISON:  04/12/2012  FINDINGS: CTA CHEST FINDINGS  No evidence for a thoracic aortic  dissection. The mid ascending thoracic aorta measures 3.3 cm and stable. Great vessels are patent. There is no significant atherosclerotic disease involving the aortic arch or ascending thoracic aorta. There is a mild atherosclerotic disease in the descending thoracic aorta. Mid descending thoracic aorta measures 2.3 cm in diameter. Pulmonary arteries are well opacified on this examination and no evidence for pulmonary embolism.  Again noted is a pectus excavatum deformity. There is no significant chest lymphadenopathy. No significant pericardial or pleural fluid.  The trachea and mainstem bronchi are patent. There is chronic dilatation of the esophagus containing gas. Chronic scarring along the lung apices. Stable nodule in the right lower lobe on sequence 8, image 38 measures roughly 5 mm. This has not significantly change since  04/12/2012. There is a new small nodular density in the right upper lobe on sequence 8, image 15. This nodule roughly measures 4 mm. No evidence for airspace disease or consolidation. There is dextroscoliosis in the lower thoracic spine.  Review of the MIP images confirms the above findings.  CTA ABDOMEN AND PELVIS FINDINGS  Mild atherosclerotic disease involving the abdominal aorta without aneurysm. There is a focal wall calcification along the posterior infrarenal abdominal aorta near the 8 o'clock position. There is concern for a small ulcerated plaque or even a small penetrating ulcer in this area, measuring roughly 5 mm. No evidence for an aorta dissection. The celiac trunk and SMA are patent. Right renal artery is patent. There is a high-grade stenosis involving the proximal left renal artery. Chronic atrophy involving the left kidney. Inferior mesenteric artery is patent. The common, internal and external iliac arteries are patent bilaterally. The proximal femoral arteries are patent bilaterally.  Evaluation of the solid organs is somewhat limited due to the arterial phase of contrast. No gross abnormality to the liver, gallbladder or spleen. Again noted is mild dilatation of main pancreatic duct. There is a 4 mm low-density structure in the distal pancreatic body/tail region on sequence 6, image 106. Unclear if this was present on the previous examination. Cysts in the right kidney. Normal appearance of the right kidney. Limited evaluation of the adrenal glands. Chronic atrophy in the left kidney with a large stone along the lower pole measuring up to 13 mm. No significant hydronephrosis. The lack of intra-abdominal fat also limits evaluation of the intra-abdominal structures. There is no significant free fluid or lymphadenopathy. Limited evaluation of the uterus. There is moderate amount of fluid in the urinary bladder. High-density ingested tablet in the anterior abdomen on sequence 6, image 135. No acute  bone abnormality. Dextroscoliosis of the lower thoracic spine. Multilevel degenerative disc disease.  Review of the MIP images confirms the above findings.  IMPRESSION: Negative for aortic dissection or aneurysm.  Small focus of irregularity along the infrarenal abdominal aorta that could represent a small ulcerated plaque or small penetrating ulcer. If this is thought to localize with the patient's symptoms, consider vascular surgery consultation. If this area is felt to be asymptomatic, recommend a follow-up CTA in 12 months.  Chronic severe stenosis involving the left renal artery with chronic left renal atrophy.  Stable 5 mm pulmonary nodule in the right lower lobe. Concern for a new 4 mm nodule in the right upper lobe. Given risk factors for bronchogenic carcinoma, follow-up chest CT at 1 year is recommended. This recommendation follows the consensus statement: Guidelines for Management of Small Pulmonary Nodules Detected on CT Scans: A Statement from the Fleischner Society as published in Radiology 2005; 237:395-400.  Indeterminate 4 mm low-density structure in the distal pancreatic body region. Recommend attention this area on follow up imaging.   Electronically Signed   By: Richarda Overlie M.D.   On: 02/11/2015 12:48   I have personally reviewed and evaluated these images and lab results as part of my medical decision-making.   EKG Interpretation None      MDM   Final diagnoses:  Other chest pain  Thoracic back pain, unspecified back pain laterality  Pulmonary nodule, right   Patient presents with worsening chest and upper back pain, with age and smoking history concern for ACS versus dissection. Plan for cardiac screen, lab work and CT anginal chest if kidney function okay. Pain meds and aspirin. Patient's pain improved on recheck. Discussed CT results in detail importance of follow-up with vascular surgery outpatient. Patient is very mild chest pain at this time. Discussed with tried  hospitalist for observation to discuss stress test.  The patients results and plan were reviewed and discussed.   Any x-rays performed were independently reviewed by myself.   Differential diagnosis were considered with the presenting HPI.  Medications  morphine 2 MG/ML injection 2 mg (2 mg Intravenous Given 02/11/15 1046)  aspirin chewable tablet 324 mg (324 mg Oral Given 02/11/15 1027)  sodium chloride 0.9 % bolus 500 mL (1,000 mLs Intravenous New Bag/Given 02/11/15 1042)  iohexol (OMNIPAQUE) 350 MG/ML injection 100 mL (80 mLs Intravenous Contrast Given 02/11/15 1154)    Filed Vitals:   02/11/15 1130 02/11/15 1230 02/11/15 1330 02/11/15 1430  BP: 157/84 157/75 151/81 137/70  Pulse: 51 60 53 50  Temp:      TempSrc:      Resp: 15 19 14 22   SpO2: 100% 97% 92% 99%    Final diagnoses:  Other chest pain  Thoracic back pain, unspecified back pain laterality  Pulmonary nodule, right        Blane Ohara, MD 02/11/15 1504

## 2015-02-11 NOTE — H&P (Signed)
[facility] History and Physical  Amber Rowland ZOX:[id number] DOB: October 03, 1933 DOA: 02/11/2015  Referring physician: ER physician PCP: PROVIDER NOT IN SYSTEM - Surgery: Dr. Baxter Kail; will provide info on [facility]  Chief Complaint: chest pain   HPI:  81 year old female with past medical history of hypertension, COPD, smoker who presented dot [facility] ED with reports of chest pain that radiated to upper back, started 1 days prior to the admission, intermittent, sharp and about 6/10 in intensity, not relieved with rest. Patient was worse with taking a deep breath. No reports of associated shortness of breath or palpitations. No lightheadedness or loss of consciousness. No abdominal pain but pt does endorse abdominal distention for quite some time but was never evaluated for this. No blood in stool or urine. No fevers or chills. No nausea or vomiting.   In ED, pt is hemodynamically stable. The troponin was WNL. The 12 lead EKG showed atrial fibrillation. Heart score is 5. Pt admitted for evaluation of chest pain and ruling out ACS.  Assessment & Plan    Active Problems:   Chest pain - Rule out ACS. CT angiogram of the chest ruled out PE. - Heart score - 5 - The first troponin level was WNL - Cycle cardiac enzymes - The 12 lead EKG showed atrial fibrillation; will repeat admission EKG - Check lipid panel, TSH - Continue aspirin daily - Oxygen support if needed to keep O2 sat above 90% - Continue pain management efforts    ESSENTIAL HYPERTENSION - Added hydralazine 10 mg every 6 hours PRN for better blood pressure control - Held Hctz due to renal insufficiency     CKD, stage 3 - Baseline creatinine 1.24 - Creatinine 1.14 on admission, stable     Pulmonary nodule, right lower lobe - Needs follow up CT imagine study in a year due to concern for bronchogenic carcinoma    Severe protein calorie malnutrition, underweight - Body mass index is 15.59 kg/(m^2).  - Nutrition  consulted    Distended abdomen - Questionable ulcer in the area of infrarenal aorta seen on CT abdomen but pt does not have symptoms other than distended abdomen - Will get US abdomen for further evaluation   DVT prophylaxis:  - SCD's bilaterally     Radiological Exams on Admission: Ct Angio Chest Aorta W/cm &/or Wo/cm 02/11/2015   Negative for aortic dissection or aneurysm.  Small focus of irregularity along the infrarenal abdominal aorta that could represent a small ulcerated plaque or small penetrating ulcer. If this is thought to localize with the patient's symptoms, consider vascular surgery consultation. If this area is felt to be asymptomatic, recommend a follow-up CTA in 12 months.  Chronic severe stenosis involving the left renal artery with chronic left renal atrophy.  Stable 5 mm pulmonary nodule in the right lower lobe. Concern for a new 4 mm nodule in the right upper lobe. Given risk factors for bronchogenic carcinoma, follow-up chest CT at 1 year is recommended. This recommendation follows the consensus statement: Guidelines for Management of Small Pulmonary Nodules Detected on CT Scans: A Statement from the Fleischner Society as published in Radiology 2005; 237:395-400.  Indeterminate 4 mm low-density structure in the distal pancreatic body region. Recommend attention this area on follow up imaging.   Electronically Signed   By: Richarda Overlie M.D.   On: 02/11/2015 12:48   Ct Angio Abd/pel W/ And/or W/o 02/11/2015  Negative for aortic dissection or aneurysm.  Small focus of irregularity along  the infrarenal abdominal aorta that could represent a small ulcerated plaque or small penetrating ulcer. If this is thought to localize with the patient's symptoms, consider vascular surgery consultation. If this area is felt to be asymptomatic, recommend a follow-up CTA in 12 months.  Chronic severe stenosis involving the left renal artery with chronic left renal atrophy.  Stable 5 mm pulmonary nodule  in the right lower lobe. Concern for a new 4 mm nodule in the right upper lobe. Given risk factors for bronchogenic carcinoma, follow-up chest CT at 1 year is recommended. This recommendation follows the consensus statement: Guidelines for Management of Small Pulmonary Nodules Detected on CT Scans: A Statement from the Fleischner Society as published in Radiology 2005; 237:395-400.  Indeterminate 4 mm low-density structure in the distal pancreatic body region. Recommend attention this area on follow up imaging.   Electronically Signed   By: Richarda Overlie M.D.   On: 02/11/2015 12:48    EKG: I have personally reviewed EKG. EKG shows atrial fibrillation   Code Status: Full Family Communication: Plan of care discussed with the patient  Disposition Plan: Admit for further evaluation, telemetry   Amber Arscott, MD  [facility] Pager [phone]  Time spent in minutes: 55 minutes  Review of Systems:  Constitutional: Negative for fever, chills and malaise/fatigue. Negative for diaphoresis.  HENT: Negative for hearing loss, ear pain, nosebleeds, congestion, sore throat, neck pain, tinnitus and ear discharge.   Eyes: Negative for blurred vision, double vision, photophobia, pain, discharge and redness.  Respiratory: Negative for cough, hemoptysis, sputum production, shortness of breath, wheezing and stridor.   Cardiovascular: per HPI.  Gastrointestinal: Negative for nausea, vomiting and abdominal pain. Negative for heartburn, constipation, blood in stool and melena.  Genitourinary: Negative for dysuria, urgency, frequency, hematuria and flank pain.  Musculoskeletal: Negative for myalgias, back pain, joint pain and falls.  Skin: Negative for itching and rash.  Neurological: Negative for dizziness and weakness. Negative for tingling, tremors, sensory change, speech change, focal weakness, loss of consciousness and headaches.  Endo/Heme/Allergies: Negative for environmental allergies and polydipsia. Does  not bruise/bleed easily.  Psychiatric/Behavioral: Negative for suicidal ideas. The patient is not nervous/anxious.      Past Medical History  Diagnosis Date  . Glaucoma (increased eye pressure)   . Ovarian cyst, right 2009    s/p BSO 2009: OVARIAN FIBROTHECOMA in multiple fluid filled cysts  4.5cm region  . Atrophy of left kidney     Chronic atrophy of the left kidney.  . Chronic constipation   . COPD (chronic obstructive pulmonary disease)     Hyperinflation and heavy smoking history strongly  . HTN (hypertension) 04/12/2012  . Benign breast cysts in female 04/12/2012    Seen on mammography 2009   . UTI (lower urinary tract infection)     History of chronic recurrent UTIs  . Malnutrition 04/28/2012  . Ileus following gastrointestinal surgery 04/28/2012  . SBO (small bowel obstruction)   . Severe malnutrition 09/16/2013  . Ejection fraction    Past Surgical History  Procedure Laterality Date  . Bilateral oophorectomy  2009    PROCEDURE:  diagnostic laparoscopy, laparotomy with bilateral salpingo-  . Laparotomy  04/23/2012    Procedure: EXPLORATORY LAPAROTOMY;  Surgeon: Valarie Merino, MD;  Location: [facility];  Service: General;  Laterality: N/A;  enterolysis    Social History:  reports that she quit smoking about 10 months ago. Her smoking use included Cigarettes. She has a 48 pack-year smoking history. She has never  used smokeless tobacco. She reports that she does not drink alcohol or use illicit drugs.  No Known Allergies  Family History:  Family History  Problem Relation Age of Onset  . Diabetes Son   . High blood pressure Son   . Coronary artery disease Son      Prior to Admission medications   Medication Sig Start Date End Date Taking? Authorizing Provider  aspirin 325 MG tablet Take 325 mg by mouth every morning.    Yes Historical Provider, MD  aspirin EC 81 MG tablet Take 162 mg by mouth daily as needed for mild pain or moderate pain.    Yes Historical Provider,  MD  hydrochlorothiazide (MICROZIDE) 12.5 MG capsule Take 1 capsule (12.5 mg total) by mouth daily. 12/29/14  Yes Elwin Mocha, MD  Travoprost, BAK Free, (TRAVATAN) 0.004 % SOLN ophthalmic solution Place 1 drop into both eyes at bedtime.    Yes Historical Provider, MD  vitamin B-12 1000 MCG tablet Take 1 tablet (1,000 mcg total) by mouth daily. 09/18/13  Yes Renae Fickle, MD  omeprazole (PRILOSEC) 20 MG capsule Take 1 capsule (20 mg total) by mouth daily. Patient not taking: Reported on 02/11/2015 10/15/14   Arby Barrette, MD  oxyCODONE-acetaminophen (PERCOCET/ROXICET) 5-325 MG per tablet Take 1 tablet by mouth every 8 (eight) hours as needed for severe pain. Patient not taking: Reported on 12/29/2014 10/15/14   Arby Barrette, MD   Physical Exam: Filed Vitals:   02/11/15 1130 02/11/15 1230 02/11/15 1330 02/11/15 1430  BP: 157/84 157/75 151/81 137/70  Pulse: 51 60 53 50  Temp:      TempSrc:      Resp: 15 19 14 22   SpO2: 100% 97% 92% 99%    Physical Exam  Constitutional: Appears malnourished, cachectic  HENT: Normocephalic. No tonsillar erythema or exudates Eyes: Conjunctivae are normal.No scleral icterus.  Neck: Normal ROM. Neck supple. No JVD. No tracheal deviation. No thyromegaly.  CVS: irregular rhythm, rate controlled, (+) S1,S2 Pulmonary: Effort and breath sounds normal, no stridor, rhonchi, wheezes, rales.  Abdominal: Soft. BS +,  Protuberant abdomen, appreciate bowel sounds  Musculoskeletal: Normal range of motion. No edema and no tenderness.  Lymphadenopathy: No lymphadenopathy noted, cervical, inguinal. Neuro: Alert. Normal reflexes, muscle tone coordination. No focal neurologic deficits. Skin: Skin is warm and dry. No rash noted.  No erythema. No pallor.  Psychiatric: Normal mood and affect. Behavior, judgment, thought content normal.   Labs on Admission:  Basic Metabolic Panel:  Recent Labs Lab 02/11/15 1049  NA 138  K 4.7  CL 103  CO2 30  GLUCOSE 85  BUN 16   CREATININE 1.14*  CALCIUM 9.7   Liver Function Tests: No results for input(s): AST, ALT, ALKPHOS, BILITOT, PROT, ALBUMIN in the last 168 hours.  Recent Labs Lab 02/11/15 1049  LIPASE 30   No results for input(s): AMMONIA in the last 168 hours. CBC:  Recent Labs Lab 02/11/15 1049  WBC 4.8  NEUTROABS 3.3  HGB 13.0  HCT 40.9  MCV 93.8  PLT 198   Cardiac Enzymes:  Recent Labs Lab 02/11/15 1049 02/11/15 1233  TROPONINI <0.03 <0.03   BNP: Invalid input(s): POCBNP CBG: No results for input(s): GLUCAP in the last 168 hours.  If 7PM-7AM, please contact night-coverage [email or web address] Password [facility] 02/11/2015, 3:25 PM

## 2015-02-11 NOTE — ED Notes (Signed)
Patient ambulated with minimal assistance to the restroom

## 2015-02-11 NOTE — ED Notes (Signed)
Pt reports upper back pain since yesterday, chest pain started this morning. Reports chronic SOB, hx of COPD.

## 2015-02-11 NOTE — Progress Notes (Signed)
pcp is [facility] [address] [place] [address] [phone]

## 2015-02-11 NOTE — ED Notes (Signed)
Patient in radiology

## 2015-02-12 ENCOUNTER — Observation Stay (HOSPITAL_COMMUNITY): Payer: Medicare Other

## 2015-02-12 DIAGNOSIS — N281 Cyst of kidney, acquired: Secondary | ICD-10-CM | POA: Diagnosis not present

## 2015-02-12 DIAGNOSIS — R0789 Other chest pain: Secondary | ICD-10-CM | POA: Diagnosis not present

## 2015-02-12 DIAGNOSIS — N261 Atrophy of kidney (terminal): Secondary | ICD-10-CM | POA: Diagnosis not present

## 2015-02-12 DIAGNOSIS — R079 Chest pain, unspecified: Secondary | ICD-10-CM | POA: Diagnosis not present

## 2015-02-12 DIAGNOSIS — L8990 Pressure ulcer of unspecified site, unspecified stage: Secondary | ICD-10-CM

## 2015-02-12 LAB — CBC
HCT: 38.2 % (ref 36.0–46.0)
Hemoglobin: 11.9 g/dL — ABNORMAL LOW (ref 12.0–15.0)
MCH: 29.3 pg (ref 26.0–34.0)
MCHC: 31.2 g/dL (ref 30.0–36.0)
MCV: 94.1 fL (ref 78.0–100.0)
PLATELETS: 185 10*3/uL (ref 150–400)
RBC: 4.06 MIL/uL (ref 3.87–5.11)
RDW: 13.8 % (ref 11.5–15.5)
WBC: 6 10*3/uL (ref 4.0–10.5)

## 2015-02-12 LAB — BASIC METABOLIC PANEL
Anion gap: 5 (ref 5–15)
BUN: 19 mg/dL (ref 6–20)
CALCIUM: 9.3 mg/dL (ref 8.9–10.3)
CO2: 30 mmol/L (ref 22–32)
CREATININE: 0.96 mg/dL (ref 0.44–1.00)
Chloride: 103 mmol/L (ref 101–111)
GFR calc non Af Amer: 55 mL/min — ABNORMAL LOW (ref >60)
Glucose, Bld: 104 mg/dL — ABNORMAL HIGH (ref 65–99)
Potassium: 4.2 mmol/L (ref 3.5–5.1)
SODIUM: 138 mmol/L (ref 135–145)

## 2015-02-12 LAB — GLUCOSE, CAPILLARY: GLUCOSE-CAPILLARY: 86 mg/dL (ref 65–99)

## 2015-02-12 LAB — TROPONIN I: Troponin I: <0.03 ng/mL (ref <0.031)

## 2015-02-12 NOTE — Progress Notes (Addendum)
Initial Nutrition Assessment  DOCUMENTATION CODES:   Severe malnutrition in context of chronic illness, Underweight  INTERVENTION:  - Continue Ensure Enlive BID, each supplement provides 350 kcal and 20 grams of protein - RD will continue to monitor for needs  NUTRITION DIAGNOSIS:   Malnutrition related to chronic illness as evidenced by per patient/family report, severe depletion of muscle mass, severe depletion of body fat.  GOAL:   Patient will meet greater than or equal to 90% of their needs  MONITOR:   PO intake, Supplement acceptance, Weight trends, Labs, I & O's  REASON FOR ASSESSMENT:   Malnutrition Screening Tool  ASSESSMENT:   81 year old female with past medical history of hypertension, COPD, smoker who presented dot [facility] ED with reports of chest pain that radiated to upper back, started 1 days prior to the admission, intermittent, sharp and about 6/10 in intensity, not relieved with rest. Patient was worse with taking a deep breath. No reports of associated shortness of breath or palpitations. No lightheadedness or loss of consciousness. No abdominal pain but pt does endorse abdominal distention for quite some time but was never evaluated for this. No blood in stool or urine. No fevers or chills. No nausea or vomiting.   Pt seen for MST. BMI indicates underweight status. Pt ate 50% of chicken salad sandwich for lunch today and states she is saving the other half for dinner. She states that she is unable to eat much at once due to SBO two years ago with partial gastrectomy at that time and that she has been underweight since that time.   She states that with PO intakes, she has gassy feeling with reflux, a feeling of tightness and pressure. She states that has been ongoing for "a long time" and that she is "unable to eat a full meal like I used to eat."   She is missing many teeth and states that she is unable to afford dentures so she prefers softer foods which she can  chew more easily.   Pt likely not fully meeting needs at this time. Ensure Enlive has been ordered BID. Severe muscle and fat wasting noted. Weight stable x2.5 years. Medications reviewed. Labs reviewed; GFR: 55.    Diet Order:  Diet regular Room service appropriate?: Yes; Fluid consistency:: Thin  Skin:  Reviewed, no issues  Last BM:  8/21  Height:   Ht Readings from Last 1 Encounters:  02/11/15 5' (1.524 m)    Weight:   Wt Readings from Last 1 Encounters:  02/12/15 82 lb 0.2 oz (37.2 kg)    Ideal Body Weight:  45.45 kg (kg)  BMI:  Body mass index is 16.02 kg/(m^2).  Estimated Nutritional Needs:   Kcal:  1610-9604  Protein:  45-55 grams  Fluid:  1.7-2 L/day  EDUCATION NEEDS:   No education needs identified at this time     Trenton Gammon, RD, LDN Inpatient Clinical Dietitian Pager # [phone] After hours/weekend pager # [phone]

## 2015-02-12 NOTE — Progress Notes (Addendum)
Patient ID: Amber Rowland, female   DOB: 1934-02-26, 81 y.o.   MRN: [id number]  [facility] PROGRESS NOTE  Amber Rowland WUJ:[id number] DOB: 1933-06-27 DOA: 02/11/2015 PCP: PROVIDER NOT IN SYSTEM   Brief narrative:    81 year old female with past medical history of hypertension, COPD, smoker who presented dot [facility] ED with reports of chest pain that radiated to upper back, started 1 days prior to the admission, intermittent, sharp and about 6/10 in intensity, not relieved with rest. Patient was worse with taking a deep breath. No reports of associated shortness of breath or palpitations. No lightheadedness or loss of consciousness. No abdominal pain but pt does endorse abdominal distention for quite some time but was never evaluated for this. No blood in stool or urine. No fevers or chills. No nausea or vomiting.   In ED, pt is hemodynamically stable. The troponin was WNL. The 12 lead EKG showed atrial fibrillation. Heart score is 5. Pt admitted for evaluation of chest pain and ruling out ACS.  Assessment/Plan:    Active Problems:  Chest pain - not present at this AM, but noted with deep breathing  - no events on tele and CE x 3 sets negative  - continue to monitor on telemetry  - no need for cardiology consult at this time, awaiting for vascular surgery recommendations     Irregularity along infrarenal abdominal aorta  - unclear etiology - abd [place] pending - I asked vascular surgery specialist to review images to see if there is a need for any intervention    ESSENTIAL HYPERTENSION - Added hydralazine 10 mg every 6 hours PRN for better blood pressure control - Held Hctz due to renal insufficiency    CKD, stage 3 - Baseline creatinine 1.24 - IVF have been provided and Cr is now WNL - will repeat BMP in AM   Pulmonary nodule, right lower and upper lobe - Needs follow up CT imagine study in a year due to concern for bronchogenic carcinoma    4 mm density in the distal  pancreatic body - noted on CT scan below - need close monitoring with repeat imagining in the next year    Severe protein calorie malnutrition, underweight - Body mass index is 15.59 kg/(m^2).  - Nutrition consulted, assistance is appreciated    Distended abdomen - Questionable ulcer in the area of infrarenal aorta seen on CT abdomen but pt does not have symptoms other than distended abdomen - US abd pending     Underweight, cachexia  - Body mass index is 16.02 kg/(m^2).  Code Status: Full.  Family Communication:  plan of care discussed with the patient Disposition Plan: Home when stable.   IV access:  Peripheral IV  Procedures and diagnostic studies:     Ct Angio Abd/pel W/ And/or W/o 02/11/2015  Negative for aortic dissection or aneurysm.  Small focus of irregularity along the infrarenal abdominal aorta that could represent a small ulcerated plaque or small penetrating ulcer. If this is thought to localize with the patient's symptoms, consider vascular surgery consultation. If this area is felt to be asymptomatic, recommend a follow-up CTA in 12 months.  Chronic severe stenosis involving the left renal artery with chronic left renal atrophy.  Stable 5 mm pulmonary nodule in the right lower lobe. Concern for a new 4 mm nodule in the right upper lobe. Given risk factors for bronchogenic carcinoma, follow-up chest CT at 1 year is recommended. This recommendation follows the consensus statement: Guidelines  for Management of Small Pulmonary Nodules Detected on CT Scans: A Statement from the Fleischner Society as published in Radiology 2005; 237:395-400.  Indeterminate 4 mm low-density structure in the distal pancreatic body region. Recommend attention this area on follow up imaging.    Medical Consultants:  Vascular surgery on the phone   Other Consultants:  None  IAnti-Infectives:   None  Debbora Presto, MD  [facility] Pager [phone]  If 7PM-7AM, please contact  night-coverage [email or web address] Password [facility] 02/12/2015, 9:55 AM   HPI/Subjective: No events overnight.   Objective: Filed Vitals:   02/11/15 1620 02/11/15 1632 02/11/15 2159 02/12/15 0500  BP:  161/78 151/59 147/60  Pulse:  70 75 76  Temp:  99.1 F (37.3 C) 98 F (36.7 C) 98 F (36.7 C)  TempSrc:  Oral Oral Oral  Resp:  Height: 5' (1.524 m)     Weight: 36.2 kg (79 lb 12.9 oz)   37.2 kg (82 lb 0.2 oz)  SpO2:  96% 98% 98%    Intake/Output Summary (Last 24 hours) at 02/12/15 0955 Last data filed at 02/11/15 2216  Gross per 24 hour  Intake 239.17 ml  Output      0 ml  Net 239.17 ml    Exam:   General:  Pt is alert, follows commands appropriately, not in acute distress  Cardiovascular: Regular rate and rhythm, no rubs, no gallops  Respiratory: Clear to auscultation bilaterally, no wheezing, no crackles, no rhonchi  Abdomen: Soft, non tender, protuberant, bowel sounds present, no guarding  Extremities: No edema, pulses DP and PT palpable bilaterally  Neuro: Grossly nonfocal  Data Reviewed: Basic Metabolic Panel:  Recent Labs Lab 02/11/15 1049 02/11/15 1635 02/12/15 0421  NA 138 139 138  K 4.7 4.3 4.2  CL 103 102 103  CO2 GLUCOSE 85 136* 104*  BUN CREATININE 1.14* 1.08* 0.96  CALCIUM 9.7 9.5 9.3  MG  --  2.0  --   PHOS  --  3.3  --    Liver Function Tests:  Recent Labs Lab 02/11/15 1635  AST 22  ALT 12*  ALKPHOS 91  BILITOT 0.3  PROT 6.8  ALBUMIN 3.6    Recent Labs Lab 02/11/15 1049  LIPASE 30   CBC:  Recent Labs Lab 02/11/15 1049 02/11/15 1635 02/12/15 0421  WBC 4.8 4.7 6.0  NEUTROABS 3.3 2.9  --   HGB 13.0 12.4 11.9*  HCT 40.9 40.0 38.2  MCV 93.8 92.8 94.1  PLT 198 179 185   Cardiac Enzymes:  Recent Labs Lab 02/11/15 1049 02/11/15 1233 02/11/15 1635 02/11/15 2205 02/12/15 0421  TROPONINI <0.03 <0.03 <0.03 <0.03 <0.03   BNP: Invalid input(s): POCBNP CBG:  Recent Labs Lab  02/12/15 0726  GLUCAP 86     Scheduled Meds: . aspirin EC  81 mg Oral Daily  . feeding supplement (ENSURE ENLIVE)  237 mL Oral BID BM  . latanoprost  1 drop Both Eyes QHS  . sodium chloride  3 mL Intravenous Q12H  . vitamin B-12  1,000 mcg Oral Daily   Continuous Infusions: . sodium chloride 50 mL/hr at 02/11/15 1900

## 2015-02-13 ENCOUNTER — Observation Stay (HOSPITAL_COMMUNITY): Payer: Medicare Other

## 2015-02-13 ENCOUNTER — Encounter (HOSPITAL_COMMUNITY): Payer: Self-pay | Admitting: Internal Medicine

## 2015-02-13 DIAGNOSIS — R4702 Dysphasia: Secondary | ICD-10-CM | POA: Diagnosis not present

## 2015-02-13 DIAGNOSIS — R140 Abdominal distension (gaseous): Secondary | ICD-10-CM | POA: Diagnosis not present

## 2015-02-13 DIAGNOSIS — E43 Unspecified severe protein-calorie malnutrition: Secondary | ICD-10-CM

## 2015-02-13 DIAGNOSIS — N179 Acute kidney failure, unspecified: Secondary | ICD-10-CM | POA: Diagnosis present

## 2015-02-13 DIAGNOSIS — N200 Calculus of kidney: Secondary | ICD-10-CM | POA: Diagnosis not present

## 2015-02-13 DIAGNOSIS — N183 Chronic kidney disease, stage 3 unspecified: Secondary | ICD-10-CM

## 2015-02-13 DIAGNOSIS — R079 Chest pain, unspecified: Secondary | ICD-10-CM | POA: Diagnosis not present

## 2015-02-13 DIAGNOSIS — K5900 Constipation, unspecified: Secondary | ICD-10-CM | POA: Diagnosis not present

## 2015-02-13 DIAGNOSIS — K8689 Other specified diseases of pancreas: Secondary | ICD-10-CM | POA: Diagnosis present

## 2015-02-13 DIAGNOSIS — I701 Atherosclerosis of renal artery: Secondary | ICD-10-CM

## 2015-02-13 DIAGNOSIS — R918 Other nonspecific abnormal finding of lung field: Secondary | ICD-10-CM

## 2015-02-13 DIAGNOSIS — K869 Disease of pancreas, unspecified: Secondary | ICD-10-CM

## 2015-02-13 HISTORY — DX: Chronic kidney disease, stage 3 unspecified: N18.30

## 2015-02-13 LAB — BASIC METABOLIC PANEL
Anion gap: 5 (ref 5–15)
BUN: 19 mg/dL (ref 6–20)
CHLORIDE: 102 mmol/L (ref 101–111)
CO2: 30 mmol/L (ref 22–32)
CREATININE: 0.89 mg/dL (ref 0.44–1.00)
Calcium: 9.3 mg/dL (ref 8.9–10.3)
GFR calc Af Amer: >60 mL/min (ref >60)
Glucose, Bld: 99 mg/dL (ref 65–99)
Potassium: 4 mmol/L (ref 3.5–5.1)
SODIUM: 137 mmol/L (ref 135–145)

## 2015-02-13 LAB — CBC
HCT: 34.7 % — ABNORMAL LOW (ref 36.0–46.0)
HEMOGLOBIN: 11.3 g/dL — AB (ref 12.0–15.0)
MCH: 30.4 pg (ref 26.0–34.0)
MCHC: 32.6 g/dL (ref 30.0–36.0)
MCV: 93.3 fL (ref 78.0–100.0)
PLATELETS: 164 10*3/uL (ref 150–400)
RBC: 3.72 MIL/uL — AB (ref 3.87–5.11)
RDW: 13.9 % (ref 11.5–15.5)
WBC: 5.8 10*3/uL (ref 4.0–10.5)

## 2015-02-13 LAB — GLUCOSE, CAPILLARY: GLUCOSE-CAPILLARY: 153 mg/dL — AB (ref 65–99)

## 2015-02-13 MED ORDER — BISACODYL 10 MG RE SUPP
10.0000 mg | Freq: Once | RECTAL | Status: AC
  Administered 2015-02-13: 10 mg via RECTAL
  Filled 2015-02-13: qty 1

## 2015-02-13 MED ORDER — POLYETHYLENE GLYCOL 3350 17 G PO PACK
17.0000 g | PACK | Freq: Every day | ORAL | Status: DC
  Administered 2015-02-13 – 2015-02-14 (×2): 17 g via ORAL
  Filled 2015-02-13 (×2): qty 1

## 2015-02-13 MED ORDER — AMLODIPINE BESYLATE 5 MG PO TABS
5.0000 mg | ORAL_TABLET | Freq: Every day | ORAL | Status: DC
  Administered 2015-02-13 – 2015-02-14 (×2): 5 mg via ORAL
  Filled 2015-02-13 (×2): qty 1

## 2015-02-13 MED ORDER — BISACODYL 10 MG RE SUPP
10.0000 mg | Freq: Every day | RECTAL | Status: DC
  Administered 2015-02-14: 10 mg via RECTAL
  Filled 2015-02-13: qty 1

## 2015-02-13 NOTE — Progress Notes (Signed)
Utilization review completed.  

## 2015-02-13 NOTE — Progress Notes (Signed)
Allevyn to upper back and sacrum changed d/t came off.

## 2015-02-13 NOTE — Evaluation (Signed)
Physical Therapy One Time Evaluation Patient Details Name: ADALAYA IRION MRN: [id number] DOB: [birth year] Today's Date: 02/13/2015   History of Present Illness  81 year old female with past medical history of hypertension, COPD, smoker who presented to [facility] ED with reports of chest pain that radiated to upper back, with hx of advanced lower cervical degenerative disc disease (per c-spine imaging previous admission)  Clinical Impression  Patient evaluated by Physical Therapy with no further acute PT needs identified. All education has been completed and the patient has no further questions. Pt mobilizing well and wondering when she can d/c home.  Pt states she is usually doing housework at home, rarely stays still.  See below for any follow-up Physial Therapy or equipment needs. PT is signing off. Thank you for this referral.        Follow Up Recommendations No PT follow up    Equipment Recommendations  None recommended by PT    Recommendations for Other Services       Precautions / Restrictions Precautions Precautions: Fall Restrictions Weight Bearing Restrictions: No      Mobility  Bed Mobility Overal bed mobility: Modified Independent                Transfers Overall transfer level: Needs assistance Equipment used: None Transfers: Sit to/from Stand Sit to Stand: Supervision            Ambulation/Gait Ambulation/Gait assistance: Supervision Ambulation Distance (Feet): 400 Feet Assistive device: None Gait Pattern/deviations: Step-through pattern;Narrow base of support;Decreased stride length     General Gait Details: slow pace, slightly unsteady at times however reports she has this at baseline, HR 80 bpm upon return to room, reports mild dizzy however also states she has been dealing with this for a few months and did not become worse  Stairs            Wheelchair Mobility    Modified Rankin (Stroke Patients Only)       Balance Overall  balance assessment: Needs assistance         Standing balance support: No upper extremity supported Standing balance-Leahy Scale: Good                               Pertinent Vitals/Pain Pain Assessment: 0-10 Pain Score: 6  Pain Location: posterior shoulder/neck on R side Pain Descriptors / Indicators: Aching Pain Intervention(s): Limited activity within patient's tolerance;Monitored during session;Repositioned    Home Living Family/patient expects to be discharged to:: Private residence Living Arrangements: Children Available Help at Discharge: Family;Available PRN/intermittently         Home Layout: One level Home Equipment: None      Prior Function Level of Independence: Independent               Hand Dominance        Extremity/Trunk Assessment               Lower Extremity Assessment: Overall WFL for tasks assessed      Cervical / Trunk Assessment: Kyphotic;Other exceptions  Communication   Communication: No difficulties  Cognition Arousal/Alertness: Awake/alert Behavior During Therapy: WFL for tasks assessed/performed Overall Cognitive Status: Within Functional Limits for tasks assessed                      General Comments      Exercises        Assessment/Plan    PT  Assessment Patent does not need any further PT services  PT Diagnosis Difficulty walking   PT Problem List    PT Treatment Interventions     PT Goals (Current goals can be found in the Care Plan section) Acute Rehab PT Goals PT Goal Formulation: All assessment and education complete, DC therapy    Frequency     Barriers to discharge        Co-evaluation               End of Session   Activity Tolerance: Patient tolerated treatment well Patient left: in bed;with call bell/phone within reach;with nursing/sitter in room Environmental consultant, instructor in room with meds) Nurse Communication: Mobility status    Functional Assessment Tool  Used: clinical judgement Functional Limitation: Mobility: Walking and moving around Mobility: Walking and Moving Around Current Status (R6045): At least 1 percent but less than 20 percent impaired, limited or restricted Mobility: Walking and Moving Around Goal Status [phone]): 0 percent impaired, limited or restricted Mobility: Walking and Moving Around Discharge Status [phone]): 0 percent impaired, limited or restricted    Time: 0914-0925 PT Time Calculation (min) (ACUTE ONLY): 11 min   Charges:   PT Evaluation $Initial PT Evaluation Tier I: 1 Procedure     PT G Codes:   PT G-Codes **NOT FOR INPATIENT CLASS** Functional Assessment Tool Used: clinical judgement Functional Limitation: Mobility: Walking and moving around Mobility: Walking and Moving Around Current Status (W2956): At least 1 percent but less than 20 percent impaired, limited or restricted Mobility: Walking and Moving Around Goal Status [phone]): 0 percent impaired, limited or restricted Mobility: Walking and Moving Around Discharge Status [phone]): 0 percent impaired, limited or restricted    Aviona Martenson,KATHrine E 02/13/2015, 10:46 AM Zenovia Jarred, PT, DPT 02/13/2015 Pager: [phone]

## 2015-02-13 NOTE — Progress Notes (Addendum)
Progress Note   Amber Rowland ZOX:[id number] DOB: 1933/06/26 DOA: 02/11/2015 PCP: PROVIDER NOT IN SYSTEM   Brief Narrative:   Amber Rowland is an 81 y.o. female with a PMH of hypertension, COPD, ongoing tobacco abuse who was admitted 02/11/15 with a chief complaint of chest pain radiating to her upper back. On admission, a 12-lead EKG showed atrial fibrillation. Initial troponins were normal.  Assessment/Plan:   Principle Problems  Chest pain with irregularity along infrarenal abdominal aorta - 12-lead EKG from admission reviewed, findings consistent with artifact/atrial fibrillation. - No events on tele and CE x 3 sets negative.  - Continue to monitor on telemetry.   - Abdominal ultrasound ordered to evaluate irregularity of infrarenal abdominal aorta. No aneurysm present, but there is a small mural thrombus and plaque. Spoke with Dr. Hart Rochester who reviewed the images and reports that this is an incidental finding was no further need for follow-up, and likely not the cause of her initial presenting symptoms.   Active problems:    Dysphagia - ST evaluation requested.    ESSENTIAL HYPERTENSION - Continue hydralazine 10 mg every 6 hours PRN for better blood pressure control. - HCTZ held due to renal insufficiency. Start Norvasc.   AKI/CKD, stage 3 - Baseline creatinine 1.24. Creatinine on admission elevated over usual baseline values - IVF have been provided and Cr is now back to baseline values.   Pulmonary nodule, right lower and upper lobe - Needs follow up CT imagine study in a year due to concern for bronchogenic carcinoma.   4 mm density in the distal pancreatic body - Incidentally noted on CT scan. - Needs close monitoring with repeat imagining in the next year.    Severe protein calorie malnutrition, underweight/cachexia - Body mass index is 15.59 kg/(m^2).  - Nutrition consulted, assistance is appreciated    Distended abdomen / chronic  constipation/ mural thrombus - Questionable ulcer in the area of infrarenal aorta seen on CT abdomen but pt does not have symptoms other than distended abdomen. - [place] abd showed a mural thrombus.  This is of no clinical significance. - Check 2 view abdomen, rule out partial bowel obstruction. - Dulcolax 10 mg suppository daily and MiraLAX daily started.    DVT Prophylaxis - Lovenox ordered.  Family Communication: No family at the bedside. Disposition Plan: Home when stable, possibly tomorrow if speech therapy evaluation reveals no treatable problem with dysphasia.  Lives with her son. Code Status:     Code Status Orders        Start     Ordered   02/11/15 1626  Full code   Continuous     02/11/15 1625        IV Access:    Peripheral IV   Procedures and diagnostic studies:   US Abdomen Complete  02/12/2015   CLINICAL DATA:  One day history of chest pain radiating to the back, abdominal distension  EXAM: ULTRASOUND ABDOMEN COMPLETE  COMPARISON:  CT scan of the aorta of 11 February 2015  FINDINGS: Gallbladder: No gallstones or wall thickening visualized. No sonographic Murphy sign noted.  Common bile duct: Diameter: 1.6 cm  Liver: The liver exhibits normal echotexture with no focal mass or ductal dilation.  IVC: No abnormality visualized.  Pancreas: Visualized portion unremarkable. No abnormality in the pancreatic body is observed on today's ultrasound  Spleen: Size and appearance within normal limits.  Right Kidney: Length: 11 cm. Echogenicity within normal limits. No hydronephrosis visualized. There is  a lower pole cyst measuring 1.7 x 1.4 x 1.4 cm.  Left Kidney: The left kidney could not be demonstrated. It was noted to be atrophic on yesterday's CT scan.  Abdominal aorta: No aneurysm visualized.  Other findings: There are small bilateral pleural effusions.  IMPRESSION: 1. No pancreatic lesion is observed on this ultrasound. 2. The liver, common bile duct, gallbladder, and spleen are  unremarkable. 3. The left kidney is atrophic. The right kidney exhibits a simple appearing lower pole cyst measuring 1.7 cm in greatest dimension. 4. The visualized portions of the abdominal aorta exhibit no aneurysm. There is a small amount of mural thrombus and plaque.   Electronically Signed   By: David  [place] M.D.   On: 02/12/2015 11:45   Ct Angio Chest Aorta W/cm &/or Wo/cm  02/11/2015   CLINICAL DATA:  81 year old with upper back pain that started yesterday. Chest pain started this morning. Chronic shortness of breath. History of tobacco use.  EXAM: CT ANGIOGRAPHY CHEST, ABDOMEN AND PELVIS  TECHNIQUE: Multidetector CT imaging through the chest, abdomen and pelvis was performed using the standard protocol during bolus administration of intravenous contrast. Multiplanar reconstructed images and MIPs were obtained and reviewed to evaluate the vascular anatomy.  CONTRAST:  80mL OMNIPAQUE IOHEXOL 350 MG/ML SOLN  COMPARISON:  04/12/2012  FINDINGS: CTA CHEST FINDINGS  No evidence for a thoracic aortic dissection. The mid ascending thoracic aorta measures 3.3 cm and stable. Great vessels are patent. There is no significant atherosclerotic disease involving the aortic arch or ascending thoracic aorta. There is a mild atherosclerotic disease in the descending thoracic aorta. Mid descending thoracic aorta measures 2.3 cm in diameter. Pulmonary arteries are well opacified on this examination and no evidence for pulmonary embolism.  Again noted is a pectus excavatum deformity. There is no significant chest lymphadenopathy. No significant pericardial or pleural fluid.  The trachea and mainstem bronchi are patent. There is chronic dilatation of the esophagus containing gas. Chronic scarring along the lung apices. Stable nodule in the right lower lobe on sequence 8, image 38 measures roughly 5 mm. This has not significantly change since 04/12/2012. There is a new small nodular density in the right upper lobe on sequence  8, image 15. This nodule roughly measures 4 mm. No evidence for airspace disease or consolidation. There is dextroscoliosis in the lower thoracic spine.  Review of the MIP images confirms the above findings.  CTA ABDOMEN AND PELVIS FINDINGS  Mild atherosclerotic disease involving the abdominal aorta without aneurysm. There is a focal wall calcification along the posterior infrarenal abdominal aorta near the 8 o'clock position. There is concern for a small ulcerated plaque or even a small penetrating ulcer in this area, measuring roughly 5 mm. No evidence for an aorta dissection. The celiac trunk and SMA are patent. Right renal artery is patent. There is a high-grade stenosis involving the proximal left renal artery. Chronic atrophy involving the left kidney. Inferior mesenteric artery is patent. The common, internal and external iliac arteries are patent bilaterally. The proximal femoral arteries are patent bilaterally.  Evaluation of the solid organs is somewhat limited due to the arterial phase of contrast. No gross abnormality to the liver, gallbladder or spleen. Again noted is mild dilatation of main pancreatic duct. There is a 4 mm low-density structure in the distal pancreatic body/tail region on sequence 6, image 106. Unclear if this was present on the previous examination. Cysts in the right kidney. Normal appearance of the right kidney. Limited evaluation of the  adrenal glands. Chronic atrophy in the left kidney with a large stone along the lower pole measuring up to 13 mm. No significant hydronephrosis. The lack of intra-abdominal fat also limits evaluation of the intra-abdominal structures. There is no significant free fluid or lymphadenopathy. Limited evaluation of the uterus. There is moderate amount of fluid in the urinary bladder. High-density ingested tablet in the anterior abdomen on sequence 6, image 135. No acute bone abnormality. Dextroscoliosis of the lower thoracic spine. Multilevel  degenerative disc disease.  Review of the MIP images confirms the above findings.  IMPRESSION: Negative for aortic dissection or aneurysm.  Small focus of irregularity along the infrarenal abdominal aorta that could represent a small ulcerated plaque or small penetrating ulcer. If this is thought to localize with the patient's symptoms, consider vascular surgery consultation. If this area is felt to be asymptomatic, recommend a follow-up CTA in 12 months.  Chronic severe stenosis involving the left renal artery with chronic left renal atrophy.  Stable 5 mm pulmonary nodule in the right lower lobe. Concern for a new 4 mm nodule in the right upper lobe. Given risk factors for bronchogenic carcinoma, follow-up chest CT at 1 year is recommended. This recommendation follows the consensus statement: Guidelines for Management of Small Pulmonary Nodules Detected on CT Scans: A Statement from the Fleischner Society as published in Radiology 2005; 237:395-400.  Indeterminate 4 mm low-density structure in the distal pancreatic body region. Recommend attention this area on follow up imaging.   Electronically Signed   By: Richarda Overlie M.D.   On: 02/11/2015 12:48   Ct Angio Abd/pel W/ And/or W/o  02/11/2015   CLINICAL DATA:  81 year old with upper back pain that started yesterday. Chest pain started this morning. Chronic shortness of breath. History of tobacco use.  EXAM: CT ANGIOGRAPHY CHEST, ABDOMEN AND PELVIS  TECHNIQUE: Multidetector CT imaging through the chest, abdomen and pelvis was performed using the standard protocol during bolus administration of intravenous contrast. Multiplanar reconstructed images and MIPs were obtained and reviewed to evaluate the vascular anatomy.  CONTRAST:  80mL OMNIPAQUE IOHEXOL 350 MG/ML SOLN  COMPARISON:  04/12/2012  FINDINGS: CTA CHEST FINDINGS  No evidence for a thoracic aortic dissection. The mid ascending thoracic aorta measures 3.3 cm and stable. Great vessels are patent. There is no  significant atherosclerotic disease involving the aortic arch or ascending thoracic aorta. There is a mild atherosclerotic disease in the descending thoracic aorta. Mid descending thoracic aorta measures 2.3 cm in diameter. Pulmonary arteries are well opacified on this examination and no evidence for pulmonary embolism.  Again noted is a pectus excavatum deformity. There is no significant chest lymphadenopathy. No significant pericardial or pleural fluid.  The trachea and mainstem bronchi are patent. There is chronic dilatation of the esophagus containing gas. Chronic scarring along the lung apices. Stable nodule in the right lower lobe on sequence 8, image 38 measures roughly 5 mm. This has not significantly change since 04/12/2012. There is a new small nodular density in the right upper lobe on sequence 8, image 15. This nodule roughly measures 4 mm. No evidence for airspace disease or consolidation. There is dextroscoliosis in the lower thoracic spine.  Review of the MIP images confirms the above findings.  CTA ABDOMEN AND PELVIS FINDINGS  Mild atherosclerotic disease involving the abdominal aorta without aneurysm. There is a focal wall calcification along the posterior infrarenal abdominal aorta near the 8 o'clock position. There is concern for a small ulcerated plaque or even a small penetrating  ulcer in this area, measuring roughly 5 mm. No evidence for an aorta dissection. The celiac trunk and SMA are patent. Right renal artery is patent. There is a high-grade stenosis involving the proximal left renal artery. Chronic atrophy involving the left kidney. Inferior mesenteric artery is patent. The common, internal and external iliac arteries are patent bilaterally. The proximal femoral arteries are patent bilaterally.  Evaluation of the solid organs is somewhat limited due to the arterial phase of contrast. No gross abnormality to the liver, gallbladder or spleen. Again noted is mild dilatation of main pancreatic  duct. There is a 4 mm low-density structure in the distal pancreatic body/tail region on sequence 6, image 106. Unclear if this was present on the previous examination. Cysts in the right kidney. Normal appearance of the right kidney. Limited evaluation of the adrenal glands. Chronic atrophy in the left kidney with a large stone along the lower pole measuring up to 13 mm. No significant hydronephrosis. The lack of intra-abdominal fat also limits evaluation of the intra-abdominal structures. There is no significant free fluid or lymphadenopathy. Limited evaluation of the uterus. There is moderate amount of fluid in the urinary bladder. High-density ingested tablet in the anterior abdomen on sequence 6, image 135. No acute bone abnormality. Dextroscoliosis of the lower thoracic spine. Multilevel degenerative disc disease.  Review of the MIP images confirms the above findings.  IMPRESSION: Negative for aortic dissection or aneurysm.  Small focus of irregularity along the infrarenal abdominal aorta that could represent a small ulcerated plaque or small penetrating ulcer. If this is thought to localize with the patient's symptoms, consider vascular surgery consultation. If this area is felt to be asymptomatic, recommend a follow-up CTA in 12 months.  Chronic severe stenosis involving the left renal artery with chronic left renal atrophy.  Stable 5 mm pulmonary nodule in the right lower lobe. Concern for a new 4 mm nodule in the right upper lobe. Given risk factors for bronchogenic carcinoma, follow-up chest CT at 1 year is recommended. This recommendation follows the consensus statement: Guidelines for Management of Small Pulmonary Nodules Detected on CT Scans: A Statement from the Fleischner Society as published in Radiology 2005; 237:395-400.  Indeterminate 4 mm low-density structure in the distal pancreatic body region. Recommend attention this area on follow up imaging.   Electronically Signed   By: Richarda Overlie M.D.    On: 02/11/2015 12:48     Medical Consultants:    None.  Anti-Infectives:    None.  Subjective:   Amber Rowland denies chest pain.  Reports some abdominal pain and constipation.  Has had nausea but no vomiting.  Poor appetite, feels bloated if she eats too much.  Minimal flatus.  Objective:    Filed Vitals:   02/12/15 0500 02/12/15 1500 02/13/15 0458 02/13/15 0800  BP: 147/60 135/84 151/73 133/71  Pulse: 76 74 65 68  Temp: 98 F (36.7 C) 97.6 F (36.4 C) 98.1 F (36.7 C) 98.9 F (37.2 C)  TempSrc: Oral Oral Oral Oral  Resp: 18 20 20 20   Height:      Weight: 37.2 kg (82 lb 0.2 oz)  36.7 kg (80 lb 14.5 oz)   SpO2: 98% 95% 94% 95%    Intake/Output Summary (Last 24 hours) at 02/13/15 0936 Last data filed at 02/13/15 0924  Gross per 24 hour  Intake 1293.83 ml  Output      0 ml  Net 1293.83 ml    Exam: Gen:  NAD Cardiovascular:  RRR, No M/R/G Respiratory:  Lungs CTAB Gastrointestinal:  Abdomen distended, + BS Extremities:  No C/E/C   Data Reviewed:    Labs: Basic Metabolic Panel:  Recent Labs Lab 02/11/15 1049 02/11/15 1635 02/12/15 0421 02/13/15 0514  NA 138 139 138 137  K 4.7 4.3 4.2 4.0  CL 103 102 103 102  CO2 GLUCOSE 85 136* 104* 99  BUN CREATININE 1.14* 1.08* 0.96 0.89  CALCIUM 9.7 9.5 9.3 9.3  MG  --  2.0  --   --   PHOS  --  3.3  --   --    GFR Estimated Creatinine Clearance: 29.7 mL/min (by C-G formula based on Cr of 0.89). Liver Function Tests:  Recent Labs Lab 02/11/15 1635  AST 22  ALT 12*  ALKPHOS 91  BILITOT 0.3  PROT 6.8  ALBUMIN 3.6    Recent Labs Lab 02/11/15 1049  LIPASE 30   Coagulation profile  Recent Labs Lab 02/11/15 1635  INR 1.08    CBC:  Recent Labs Lab 02/11/15 1049 02/11/15 1635 02/12/15 0421 02/13/15 0514  WBC 4.8 4.7 6.0 5.8  NEUTROABS 3.3 2.9  --   --   HGB 13.0 12.4 11.9* 11.3*  HCT 40.9 40.0 38.2 34.7*  MCV 93.8 92.8 94.1 93.3  PLT 198 179 185  164   Cardiac Enzymes:  Recent Labs Lab 02/11/15 1049 02/11/15 1233 02/11/15 1635 02/11/15 2205 02/12/15 0421  TROPONINI <0.03 <0.03 <0.03 <0.03 <0.03   CBG:  Recent Labs Lab 02/12/15 0726 02/13/15 0735  GLUCAP 86 153*   Lipid Profile:  Recent Labs  02/11/15 1559  CHOL 184  HDL 62  LDLCALC 108*  TRIG 70  CHOLHDL 3.0   Thyroid function studies:  Recent Labs  02/11/15 1635  TSH 5.160*   Sepsis Labs:  Recent Labs Lab 02/11/15 1049 02/11/15 1635 02/12/15 0421 02/13/15 0514  WBC 4.8 4.7 6.0 5.8   Microbiology No results found for this or any previous visit (from the past 240 hour(s)).   Medications:   . aspirin EC  81 mg Oral Daily  . feeding supplement (ENSURE ENLIVE)  237 mL Oral BID BM  . latanoprost  1 drop Both Eyes QHS  . sodium chloride  3 mL Intravenous Q12H  . vitamin B-12  1,000 mcg Oral Daily   Continuous Infusions:   Time spent: 35 minutes with > 50% of time discussing current diagnostic test results, clinical impression and plan of care.      RAMA,CHRISTINA  [facility] Pager [phone]. If unable to reach me by pager, please call my cell phone at [phone].  *Please refer to [email or web address], password [email or web address] to get updated schedule on who will round on this patient, as hospitalists switch teams weekly. If 7PM-7AM, please contact night-coverage at [email or web address], password [email or web address] for any overnight needs.  02/13/2015, 9:36 AM

## 2015-02-13 NOTE — Care Management Note (Signed)
Case Management Note  Patient Details  Name: Amber Rowland MRN: [id number] Date of Birth: 1934-03-29  Subjective/Objective:  81 y/o f admitted w/chest pain. From home.                 Action/Plan:d/c plan home.   Expected Discharge Date:   (unknown)               Expected Discharge Plan:  Home/Self Care  In-House Referral:     Discharge planning Services  CM Consult  Post Acute Care Choice:    Choice offered to:     DME Arranged:    DME Agency:     HH Arranged:    HH Agency:     Status of Service:  In process, will continue to follow  Medicare Important Message Given:    Date Medicare IM Given:    Medicare IM give by:    Date Additional Medicare IM Given:    Additional Medicare Important Message give by:     If discussed at Long Length of Stay Meetings, dates discussed:    Additional Comments:  Lanier Clam, RN 02/13/2015, 1:59 PM

## 2015-02-14 ENCOUNTER — Observation Stay (HOSPITAL_COMMUNITY): Payer: Medicare Other

## 2015-02-14 ENCOUNTER — Encounter (HOSPITAL_COMMUNITY): Payer: Self-pay | Admitting: Internal Medicine

## 2015-02-14 DIAGNOSIS — R079 Chest pain, unspecified: Secondary | ICD-10-CM | POA: Diagnosis not present

## 2015-02-14 DIAGNOSIS — R911 Solitary pulmonary nodule: Secondary | ICD-10-CM | POA: Insufficient documentation

## 2015-02-14 DIAGNOSIS — R0789 Other chest pain: Secondary | ICD-10-CM | POA: Diagnosis not present

## 2015-02-14 DIAGNOSIS — R140 Abdominal distension (gaseous): Secondary | ICD-10-CM | POA: Insufficient documentation

## 2015-02-14 DIAGNOSIS — L8990 Pressure ulcer of unspecified site, unspecified stage: Secondary | ICD-10-CM

## 2015-02-14 DIAGNOSIS — I700 Atherosclerosis of aorta: Secondary | ICD-10-CM | POA: Diagnosis not present

## 2015-02-14 DIAGNOSIS — K224 Dyskinesia of esophagus: Secondary | ICD-10-CM

## 2015-02-14 DIAGNOSIS — K449 Diaphragmatic hernia without obstruction or gangrene: Secondary | ICD-10-CM | POA: Diagnosis not present

## 2015-02-14 DIAGNOSIS — N179 Acute kidney failure, unspecified: Secondary | ICD-10-CM | POA: Diagnosis not present

## 2015-02-14 DIAGNOSIS — R1310 Dysphagia, unspecified: Secondary | ICD-10-CM

## 2015-02-14 HISTORY — DX: Dyskinesia of esophagus: K22.4

## 2015-02-14 LAB — BASIC METABOLIC PANEL
Anion gap: 7 (ref 5–15)
BUN: 19 mg/dL (ref 6–20)
CHLORIDE: 104 mmol/L (ref 101–111)
CO2: 30 mmol/L (ref 22–32)
CREATININE: 0.81 mg/dL (ref 0.44–1.00)
Calcium: 9.6 mg/dL (ref 8.9–10.3)
GFR calc Af Amer: >60 mL/min (ref >60)
GFR calc non Af Amer: >60 mL/min (ref >60)
GLUCOSE: 93 mg/dL (ref 65–99)
POTASSIUM: 4.2 mmol/L (ref 3.5–5.1)
SODIUM: 141 mmol/L (ref 135–145)

## 2015-02-14 LAB — GLUCOSE, CAPILLARY: Glucose-Capillary: 88 mg/dL (ref 65–99)

## 2015-02-14 MED ORDER — POLYVINYL ALCOHOL 1.4 % OP SOLN
1.0000 [drp] | OPHTHALMIC | Status: DC | PRN
  Filled 2015-02-14: qty 15

## 2015-02-14 MED ORDER — ONDANSETRON HCL 4 MG PO TABS: 4.0000 mg | ORAL_TABLET | Freq: Four times a day (QID) | ORAL | Status: DC | PRN

## 2015-02-14 MED ORDER — POLYETHYLENE GLYCOL 3350 17 G PO PACK: 17.0000 g | PACK | Freq: Every day | ORAL | Status: DC

## 2015-02-14 MED ORDER — AMLODIPINE BESYLATE 5 MG PO TABS: 5.0000 mg | ORAL_TABLET | Freq: Every day | ORAL | Status: DC

## 2015-02-14 MED ORDER — ENSURE ENLIVE PO LIQD: 237.0000 mL | Freq: Two times a day (BID) | ORAL | Status: DC

## 2015-02-14 MED ORDER — OMEPRAZOLE 20 MG PO CPDR: 20.0000 mg | DELAYED_RELEASE_CAPSULE | Freq: Every day | ORAL | Status: DC

## 2015-02-14 NOTE — Discharge Summary (Signed)
Physician Discharge Summary  DALA BREAULT ZOX:[id number] DOB: April 18, 1934 DOA: 02/11/2015  PCP: PROVIDER NOT IN SYSTEM  Admit date: 02/11/2015 Discharge date: 02/14/2015   Recommendations for Outpatient Follow-Up:   1. The patient will need a follow-up CT of the lungs in one year to assess stability of lung nodules. 2. The patient will need a follow-up CT of the abdomen in one year to reassess pancreatic abnormality.   Discharge Diagnosis:   Principal Problem:    Chest pain likely secondary to esophageal dysmotility Active Problems:    Chronic constipation    Lung nodules, ?incidental    Protein-calorie malnutrition, severe    Left renal artery stenosis    Dysphasia    Acute kidney injury    Stage III chronic kidney disease    Pancreatic mass    Pressure ulcer    Atherosclerosis of aorta with small ruptured plaque and mural thrombus   Discharge disposition:  Home.    Discharge Condition: Improved.  Diet recommendation: Low sodium, heart healthy.    Wound care: Soft silicone dressings to pressure sores.   History of Present Illness:   SOPHIRA RUMLER is an 81 y.o. female with a PMH of hypertension, COPD, ongoing tobacco abuse who was admitted 02/11/15 with a chief complaint of chest pain radiating to her upper back. On admission, a 12-lead EKG showed atrial fibrillation. Initial troponins were normal.  Hospital Course by Problem:   Principle Problems  Chest pain secondary to esophageal dysmotility / dysphasia - 12-lead EKG from admission reviewed, findings consistent with artifact, not atrial fibrillation. - No events on tele and CE x 3 sets negative.  - Abdominal aortic irregularity assessed by vascular surgeon and not felt to be the etiologic cause of her admitting symptoms. - Because of complaints of dysphasia, pt was evaluated by the speech therapist and ultimately had an esophagram done. - Esophagram showed findings consistent with esophageal  dysmotility, patient reassured.    Active problems:    Ruptured aortic plaque with small mural thrombus - Abdominal ultrasound ordered to evaluate irregularity of infrarenal abdominal aorta seen on CT.  - No aneurysm present, but there was a small mural thrombus and plaque.  - Spoke with Dr. Hart Rochester who reviewed the images and reports that this is an incidental finding with no F/U needed. - Cholesterol 184. We'll defer to PCP whether or not this patient would benefit from lipid lowering therapy given her age.   Pressure sore - Wound care provided per RN.   ESSENTIAL HYPERTENSION - HCTZ held due to renal insufficiency. Norvasc started 02/13/15. Blood pressure reasonable.   AKI/CKD, stage 3 - Baseline creatinine 1.24. Creatinine on admission elevated over usual baseline values - IVF have been provided and Cr is now back to baseline values.   Pulmonary nodule, right lower and upper lobe - Needs follow up CT imagine study in a year due to concern for bronchogenic carcinoma.   4 mm density in the distal pancreatic body - Incidentally noted on CT scan. - Needs close monitoring with repeat imagining in the next year.    Severe protein calorie malnutrition, underweight/cachexia - Body mass index is 15.59 kg/(m^2).  - Evaluated by dietitian, provided with nutritional supplements.   Distended abdomen / chronic constipation/ mural thrombus - [place] abd showed a mural thrombus. This is of no clinical significance. - 2 view abdomen done 02/13/15: No evidence of bowel obstruction. - Dulcolax 10 mg suppository daily and MiraLAX daily started.   Medical Consultants:  Telephone consultation made with Dr. Hart Rochester, Vascular Surgery   Discharge Exam:   Filed Vitals:   02/14/15 1304  BP: 109/65  Pulse: 93  Temp: 97.5 F (36.4 C)  Resp: 19   Filed Vitals:   02/13/15 1500 02/13/15 2128 02/14/15 0523 02/14/15 1304  BP: 149/82 145/86 157/86 109/65  Pulse: 74 93 97 93  Temp: 98.3  F (36.8 C) 97.8 F (36.6 C) 97.6 F (36.4 C) 97.5 F (36.4 C)  TempSrc: Oral Oral Oral Oral  Resp: 20 20 20 19   Height:      Weight:   36.5 kg (80 lb 7.5 oz)   SpO2: 98% 96% 97% 98%    Gen:  NAD Psych: Anxious Cardiovascular:  RRR, No M/R/G Respiratory: Lungs CTAB Gastrointestinal: Abdomen softer but still mildly distended, with normal active bowel sounds. Extremities: No C/E/C   The results of significant diagnostics from this hospitalization (including imaging, microbiology, ancillary and laboratory) are listed below for reference.     Procedures and Diagnostic Studies:   US Abdomen Complete  02/12/2015   CLINICAL DATA:  One day history of chest pain radiating to the back, abdominal distension  EXAM: ULTRASOUND ABDOMEN COMPLETE  COMPARISON:  CT scan of the aorta of 11 February 2015  FINDINGS: Gallbladder: No gallstones or wall thickening visualized. No sonographic Murphy sign noted.  Common bile duct: Diameter: 1.6 cm  Liver: The liver exhibits normal echotexture with no focal mass or ductal dilation.  IVC: No abnormality visualized.  Pancreas: Visualized portion unremarkable. No abnormality in the pancreatic body is observed on today's ultrasound  Spleen: Size and appearance within normal limits.  Right Kidney: Length: 11 cm. Echogenicity within normal limits. No hydronephrosis visualized. There is a lower pole cyst measuring 1.7 x 1.4 x 1.4 cm.  Left Kidney: The left kidney could not be demonstrated. It was noted to be atrophic on yesterday's CT scan.  Abdominal aorta: No aneurysm visualized.  Other findings: There are small bilateral pleural effusions.  IMPRESSION: 1. No pancreatic lesion is observed on this ultrasound. 2. The liver, common bile duct, gallbladder, and spleen are unremarkable. 3. The left kidney is atrophic. The right kidney exhibits a simple appearing lower pole cyst measuring 1.7 cm in greatest dimension. 4. The visualized portions of the abdominal aorta exhibit no  aneurysm. There is a small amount of mural thrombus and plaque.   Electronically Signed   By: David  [place] M.D.   On: 02/12/2015 11:45   Dg Esophagus  02/14/2015   CLINICAL DATA:  Sensation of material sticking in the esophagus over the past 3 months. Nausea and belching.  EXAM: ESOPHOGRAM/BARIUM SWALLOW  TECHNIQUE: Single contrast examination was performed using  thin barium.  FLUOROSCOPY TIME:  Radiation Exposure Index (as provided by the fluoroscopic device):  If the device does not provide the exposure index:  Fluoroscopy Time:  1 minutes, 28 seconds  Number of Acquired Images:  30  COMPARISON:  02/11/2015  FINDINGS: Prominent cricopharyngeus muscle. This is shown on image 8 series 2. Cervical spondylosis with grade 1 anterolisthesis at C3-4 and C4-5.  Primary peristaltic waves in the esophagus were disrupted on 3 out of 4 swallows. Secondary and tertiary contractions in the esophagus noted. Mild fold thickening in the distal esophagus observed. Small type 1 hiatal hernia.  Aerated 13 mm barium tablet passed without difficulty into the stomach.  IMPRESSION: 1. Nonspecific esophageal dysmotility disorder. Occasional tertiary contractions. No esophageal stricture identified. Most of the esophagus is mildly dilated. 2. Prominent  cricopharyngeus muscle. 3. Small type 1 hiatal hernia.   Electronically Signed   By: Gaylyn Rong M.D.   On: 02/14/2015 13:26   Dg Abd 2 Views  02/13/2015   CLINICAL DATA:  Bloating, abdominal pain, nausea  EXAM: ABDOMEN - 2 VIEW  COMPARISON:  CT abdomen pelvis dated 02/11/2015  FINDINGS: Nonobstructive bowel gas pattern. Mildly prominent loop of bowel in the central abdomen on the upright view likely reflects sigmoid colon.  No evidence of free air under the diaphragm on the upright view.  Thoracolumbar scoliosis.  13 mm nonobstructing left lower pole renal calculus in the left mid abdomen.  IMPRESSION: No evidence of small bowel obstruction or free air.  13 mm nonobstructing  left renal calculus.   Electronically Signed   By: Charline Bills M.D.   On: 02/13/2015 15:08   Ct Angio Chest Aorta W/cm &/or Wo/cm  02/11/2015   CLINICAL DATA:  81 year old with upper back pain that started yesterday. Chest pain started this morning. Chronic shortness of breath. History of tobacco use.  EXAM: CT ANGIOGRAPHY CHEST, ABDOMEN AND PELVIS  TECHNIQUE: Multidetector CT imaging through the chest, abdomen and pelvis was performed using the standard protocol during bolus administration of intravenous contrast. Multiplanar reconstructed images and MIPs were obtained and reviewed to evaluate the vascular anatomy.  CONTRAST:  80mL OMNIPAQUE IOHEXOL 350 MG/ML SOLN  COMPARISON:  04/12/2012  FINDINGS: CTA CHEST FINDINGS  No evidence for a thoracic aortic dissection. The mid ascending thoracic aorta measures 3.3 cm and stable. Great vessels are patent. There is no significant atherosclerotic disease involving the aortic arch or ascending thoracic aorta. There is a mild atherosclerotic disease in the descending thoracic aorta. Mid descending thoracic aorta measures 2.3 cm in diameter. Pulmonary arteries are well opacified on this examination and no evidence for pulmonary embolism.  Again noted is a pectus excavatum deformity. There is no significant chest lymphadenopathy. No significant pericardial or pleural fluid.  The trachea and mainstem bronchi are patent. There is chronic dilatation of the esophagus containing gas. Chronic scarring along the lung apices. Stable nodule in the right lower lobe on sequence 8, image 38 measures roughly 5 mm. This has not significantly change since 04/12/2012. There is a new small nodular density in the right upper lobe on sequence 8, image 15. This nodule roughly measures 4 mm. No evidence for airspace disease or consolidation. There is dextroscoliosis in the lower thoracic spine.  Review of the MIP images confirms the above findings.  CTA ABDOMEN AND PELVIS FINDINGS  Mild  atherosclerotic disease involving the abdominal aorta without aneurysm. There is a focal wall calcification along the posterior infrarenal abdominal aorta near the 8 o'clock position. There is concern for a small ulcerated plaque or even a small penetrating ulcer in this area, measuring roughly 5 mm. No evidence for an aorta dissection. The celiac trunk and SMA are patent. Right renal artery is patent. There is a high-grade stenosis involving the proximal left renal artery. Chronic atrophy involving the left kidney. Inferior mesenteric artery is patent. The common, internal and external iliac arteries are patent bilaterally. The proximal femoral arteries are patent bilaterally.  Evaluation of the solid organs is somewhat limited due to the arterial phase of contrast. No gross abnormality to the liver, gallbladder or spleen. Again noted is mild dilatation of main pancreatic duct. There is a 4 mm low-density structure in the distal pancreatic body/tail region on sequence 6, image 106. Unclear if this was present on the previous examination.  Cysts in the right kidney. Normal appearance of the right kidney. Limited evaluation of the adrenal glands. Chronic atrophy in the left kidney with a large stone along the lower pole measuring up to 13 mm. No significant hydronephrosis. The lack of intra-abdominal fat also limits evaluation of the intra-abdominal structures. There is no significant free fluid or lymphadenopathy. Limited evaluation of the uterus. There is moderate amount of fluid in the urinary bladder. High-density ingested tablet in the anterior abdomen on sequence 6, image 135. No acute bone abnormality. Dextroscoliosis of the lower thoracic spine. Multilevel degenerative disc disease.  Review of the MIP images confirms the above findings.  IMPRESSION: Negative for aortic dissection or aneurysm.  Small focus of irregularity along the infrarenal abdominal aorta that could represent a small ulcerated plaque or  small penetrating ulcer. If this is thought to localize with the patient's symptoms, consider vascular surgery consultation. If this area is felt to be asymptomatic, recommend a follow-up CTA in 12 months.  Chronic severe stenosis involving the left renal artery with chronic left renal atrophy.  Stable 5 mm pulmonary nodule in the right lower lobe. Concern for a new 4 mm nodule in the right upper lobe. Given risk factors for bronchogenic carcinoma, follow-up chest CT at 1 year is recommended. This recommendation follows the consensus statement: Guidelines for Management of Small Pulmonary Nodules Detected on CT Scans: A Statement from the Fleischner Society as published in Radiology 2005; 237:395-400.  Indeterminate 4 mm low-density structure in the distal pancreatic body region. Recommend attention this area on follow up imaging.   Electronically Signed   By: Richarda Overlie M.D.   On: 02/11/2015 12:48   Ct Angio Abd/pel W/ And/or W/o  02/11/2015   CLINICAL DATA:  81 year old with upper back pain that started yesterday. Chest pain started this morning. Chronic shortness of breath. History of tobacco use.  EXAM: CT ANGIOGRAPHY CHEST, ABDOMEN AND PELVIS  TECHNIQUE: Multidetector CT imaging through the chest, abdomen and pelvis was performed using the standard protocol during bolus administration of intravenous contrast. Multiplanar reconstructed images and MIPs were obtained and reviewed to evaluate the vascular anatomy.  CONTRAST:  80mL OMNIPAQUE IOHEXOL 350 MG/ML SOLN  COMPARISON:  04/12/2012  FINDINGS: CTA CHEST FINDINGS  No evidence for a thoracic aortic dissection. The mid ascending thoracic aorta measures 3.3 cm and stable. Great vessels are patent. There is no significant atherosclerotic disease involving the aortic arch or ascending thoracic aorta. There is a mild atherosclerotic disease in the descending thoracic aorta. Mid descending thoracic aorta measures 2.3 cm in diameter. Pulmonary arteries are well  opacified on this examination and no evidence for pulmonary embolism.  Again noted is a pectus excavatum deformity. There is no significant chest lymphadenopathy. No significant pericardial or pleural fluid.  The trachea and mainstem bronchi are patent. There is chronic dilatation of the esophagus containing gas. Chronic scarring along the lung apices. Stable nodule in the right lower lobe on sequence 8, image 38 measures roughly 5 mm. This has not significantly change since 04/12/2012. There is a new small nodular density in the right upper lobe on sequence 8, image 15. This nodule roughly measures 4 mm. No evidence for airspace disease or consolidation. There is dextroscoliosis in the lower thoracic spine.  Review of the MIP images confirms the above findings.  CTA ABDOMEN AND PELVIS FINDINGS  Mild atherosclerotic disease involving the abdominal aorta without aneurysm. There is a focal wall calcification along the posterior infrarenal abdominal aorta near the 8  o'clock position. There is concern for a small ulcerated plaque or even a small penetrating ulcer in this area, measuring roughly 5 mm. No evidence for an aorta dissection. The celiac trunk and SMA are patent. Right renal artery is patent. There is a high-grade stenosis involving the proximal left renal artery. Chronic atrophy involving the left kidney. Inferior mesenteric artery is patent. The common, internal and external iliac arteries are patent bilaterally. The proximal femoral arteries are patent bilaterally.  Evaluation of the solid organs is somewhat limited due to the arterial phase of contrast. No gross abnormality to the liver, gallbladder or spleen. Again noted is mild dilatation of main pancreatic duct. There is a 4 mm low-density structure in the distal pancreatic body/tail region on sequence 6, image 106. Unclear if this was present on the previous examination. Cysts in the right kidney. Normal appearance of the right kidney. Limited  evaluation of the adrenal glands. Chronic atrophy in the left kidney with a large stone along the lower pole measuring up to 13 mm. No significant hydronephrosis. The lack of intra-abdominal fat also limits evaluation of the intra-abdominal structures. There is no significant free fluid or lymphadenopathy. Limited evaluation of the uterus. There is moderate amount of fluid in the urinary bladder. High-density ingested tablet in the anterior abdomen on sequence 6, image 135. No acute bone abnormality. Dextroscoliosis of the lower thoracic spine. Multilevel degenerative disc disease.  Review of the MIP images confirms the above findings.  IMPRESSION: Negative for aortic dissection or aneurysm.  Small focus of irregularity along the infrarenal abdominal aorta that could represent a small ulcerated plaque or small penetrating ulcer. If this is thought to localize with the patient's symptoms, consider vascular surgery consultation. If this area is felt to be asymptomatic, recommend a follow-up CTA in 12 months.  Chronic severe stenosis involving the left renal artery with chronic left renal atrophy.  Stable 5 mm pulmonary nodule in the right lower lobe. Concern for a new 4 mm nodule in the right upper lobe. Given risk factors for bronchogenic carcinoma, follow-up chest CT at 1 year is recommended. This recommendation follows the consensus statement: Guidelines for Management of Small Pulmonary Nodules Detected on CT Scans: A Statement from the Fleischner Society as published in Radiology 2005; 237:395-400.  Indeterminate 4 mm low-density structure in the distal pancreatic body region. Recommend attention this area on follow up imaging.   Electronically Signed   By: Richarda Overlie M.D.   On: 02/11/2015 12:48     Labs:   Basic Metabolic Panel:  Recent Labs Lab 02/11/15 1049 02/11/15 1635 02/12/15 0421 02/13/15 0514 02/14/15 0520  NA 138 139 138 137 141  K 4.7 4.3 4.2 4.0 4.2  CL 103 102 103 102 104  CO2 30 30  30 30 30   GLUCOSE 85 136* 104* 99 93  BUN 16 14 19 19 19   CREATININE 1.14* 1.08* 0.96 0.89 0.81  CALCIUM 9.7 9.5 9.3 9.3 9.6  MG  --  2.0  --   --   --   PHOS  --  3.3  --   --   --    GFR Estimated Creatinine Clearance: 32.5 mL/min (by C-G formula based on Cr of 0.81). Liver Function Tests:  Recent Labs Lab 02/11/15 1635  AST 22  ALT 12*  ALKPHOS 91  BILITOT 0.3  PROT 6.8  ALBUMIN 3.6    Recent Labs Lab 02/11/15 1049  LIPASE 30   Coagulation profile  Recent Labs Lab 02/11/15  1635  INR 1.08    CBC:  Recent Labs Lab 02/11/15 1049 02/11/15 1635 02/12/15 0421 02/13/15 0514  WBC 4.8 4.7 6.0 5.8  NEUTROABS 3.3 2.9  --   --   HGB 13.0 12.4 11.9* 11.3*  HCT 40.9 40.0 38.2 34.7*  MCV 93.8 92.8 94.1 93.3  PLT 198 179 185 164   Cardiac Enzymes:  Recent Labs Lab 02/11/15 1049 02/11/15 1233 02/11/15 1635 02/11/15 2205 02/12/15 0421  TROPONINI <0.03 <0.03 <0.03 <0.03 <0.03   CBG:  Recent Labs Lab 02/12/15 0726 02/13/15 0735 02/14/15 0724  GLUCAP 86 153* 88   Lipid Profile  Recent Labs  02/11/15 1559  CHOL 184  HDL 62  LDLCALC 108*  TRIG 70  CHOLHDL 3.0   Thyroid function studies  Recent Labs  02/11/15 1635  TSH 5.160*     Discharge Instructions:   Discharge Instructions    Call MD for:  extreme fatigue    Complete by:  As directed      Call MD for:  persistant dizziness or light-headedness    Complete by:  As directed      Call MD for:  persistant nausea and vomiting    Complete by:  As directed      Diet - low sodium heart healthy    Complete by:  As directed      Increase activity slowly    Complete by:  As directed             Medication List    STOP taking these medications        hydrochlorothiazide 12.5 MG capsule  Commonly known as:  MICROZIDE     oxyCODONE-acetaminophen 5-325 MG per tablet  Commonly known as:  PERCOCET/ROXICET      TAKE these medications        amLODipine 5 MG tablet  Commonly known  as:  NORVASC  Take 1 tablet (5 mg total) by mouth daily.     aspirin EC 81 MG tablet  Take 162 mg by mouth daily as needed for mild pain or moderate pain.     cyanocobalamin 1000 MCG tablet  Take 1 tablet (1,000 mcg total) by mouth daily.     feeding supplement (ENSURE ENLIVE) Liqd  Take 237 mLs by mouth 2 (two) times daily between meals.     omeprazole 20 MG capsule  Commonly known as:  PRILOSEC  Take 1 capsule (20 mg total) by mouth daily.     ondansetron 4 MG tablet  Commonly known as:  ZOFRAN  Take 1 tablet (4 mg total) by mouth every 6 (six) hours as needed for nausea.     polyethylene glycol packet  Commonly known as:  MIRALAX / GLYCOLAX  Take 17 g by mouth daily.     Travoprost (BAK Free) 0.004 % Soln ophthalmic solution  Commonly known as:  TRAVATAN  Place 1 drop into both eyes at bedtime.           Follow-up Information    Follow up with [facility] . Schedule an appointment as soon as possible for a visit on 02/11/2015.   Why:  This is your listed medicaid [place] access assigned doctor If you prefer another call your local DSS Dept of social services    Contact information:   [facility] [address] [place] [address] [phone]       Time coordinating discharge: 35 minutes.  Signed:  RAMA,CHRISTINA  Pager [phone] [facility] 02/14/2015, 3:11 PM

## 2015-02-14 NOTE — Progress Notes (Signed)
Dischare was delayed d/t esophogram to be done and read Dr Rama spoke to pt and family few minutes ago.

## 2015-02-14 NOTE — Progress Notes (Signed)
For discharge today after esophagram

## 2015-02-14 NOTE — Progress Notes (Signed)
D/C 'd via [facility] w/ family voices no c/o.

## 2015-02-14 NOTE — Care Management Note (Signed)
Case Management Note  Patient Details  Name: Amber Rowland MRN: [id number] Date of Birth: 02/22/1934  Subjective/Objective:                    Action/Plan:d/c home no needs.   Expected Discharge Date:   (unknown)               Expected Discharge Plan:  Home/Self Care  In-House Referral:     Discharge planning Services  CM Consult  Post Acute Care Choice:    Choice offered to:     DME Arranged:    DME Agency:     HH Arranged:    HH Agency:     Status of Service:  Completed, signed off  Medicare Important Message Given:    Date Medicare IM Given:    Medicare IM give by:    Date Additional Medicare IM Given:    Additional Medicare Important Message give by:     If discussed at Long Length of Stay Meetings, dates discussed:    Additional Comments:  Lanier Clam, RN 02/14/2015, 2:47 PM

## 2015-02-14 NOTE — Evaluation (Addendum)
Clinical/Bedside Swallow Evaluation Patient Details  Name: Amber Rowland MRN: [id number] Date of Birth: 29-Feb-1934  Today's Date: 02/14/2015 Time: SLP Start Time (ACUTE ONLY): 1123 SLP Stop Time (ACUTE ONLY): 1140 SLP Time Calculation (min) (ACUTE ONLY): 17 min  Past Medical History:  Past Medical History  Diagnosis Date  . Glaucoma (increased eye pressure)   . Ovarian cyst, right 2009    s/p BSO 2009: OVARIAN FIBROTHECOMA in multiple fluid filled cysts  4.5cm region  . Atrophy of left kidney     Chronic atrophy of the left kidney.  . Chronic constipation   . COPD (chronic obstructive pulmonary disease)     Hyperinflation and heavy smoking history strongly  . HTN (hypertension) 04/12/2012  . Benign breast cysts in female 04/12/2012    Seen on mammography 2009   . UTI (lower urinary tract infection)     History of chronic recurrent UTIs  . Malnutrition 04/28/2012  . Ileus following gastrointestinal surgery 04/28/2012  . SBO (small bowel obstruction)   . Severe malnutrition 09/16/2013  . Ejection fraction   . Stage III chronic kidney disease 02/13/2015   Past Surgical History:  Past Surgical History  Procedure Laterality Date  . Bilateral oophorectomy  2009    PROCEDURE:  diagnostic laparoscopy, laparotomy with bilateral salpingo-  . Laparotomy  04/23/2012    Procedure: EXPLORATORY LAPAROTOMY;  Surgeon: Valarie Merino, MD;  Location: [facility];  Service: General;  Laterality: N/A;  enterolysis    HPI:  Amber Rowland is an 81 y.o. female with a PMH of hypertension, COPD, ongoing tobacco abuse who was admitted 02/11/15 with a chief complaint of chest pain radiating to her upper back. On admission, a 12-lead EKG showed atrial fibrillation. Initial troponins were normal.   Assessment / Plan / Recommendation Clinical Impression  Pt presents with negative CN exam and no indication of dyspahgia with boluses observed *thin soda and saltine craker.  Adequate "mastication" despite  only few dentition without oral residuals.  Audible swallow noted with liquids - ? CP source.    Pt does admit to pill dysphagia for 4 months = pills lodging in pharynx.  Pt has modified her pill regimen and stopped taking calcium due to her dysphagia.  Thicker liquid/food dysphagia = poor clearance in mid=esophagus x 4 months.  Thin liquids faciliate clearance sensation per pt.  C/O gassy sensation with frequent belching and abdominal discomfort noted after meals since abdomen surgery 3 years per pt.    Pt does appear with ? area of protursion at distal esophagus - ? if this is contributing to dysphagia symptoms.    Spoke to Md and esophagram ordered for pt today prior to dc.  SlP to follow up after as time allows to help pt mitigate dysphagia symptoms.    Provided written compensation strategies to pt.  Advised her to avoid straws due to excess gas sensation - she reports she normally does not use straws.     Aspiration Risk    mild   Diet Recommendation Age appropriate regular solids;Thin   Medication Administration: Whole meds with liquid (or with pudding/applesauce if pt deems helpful) Compensations: Slow rate;Small sips/bites    Other  Recommendations Recommended Consults: Consider esophageal assessment Oral Care Recommendations: Oral care BID   Follow Up Recommendations       Frequency and Duration min 1 x/week  1 week   Pertinent Vitals/Pain Afebrile, decreased      Swallow Study Prior Functional Status   see hhx  General Date of Onset: 02/14/15 Other Pertinent Information: Amber Rowland is an 81 y.o. female with a PMH of hypertension, COPD, ongoing tobacco abuse who was admitted 02/11/15 with a chief complaint of chest pain radiating to her upper back. On admission, a 12-lead EKG showed atrial fibrillation. Initial troponins were normal. Type of Study: Bedside swallow evaluation Diet Prior to this Study: Regular;Thin liquids Temperature Spikes Noted:  No Respiratory Status: Room air History of Recent Intubation: No Behavior/Cognition: Alert;Cooperative;Pleasant mood Oral Cavity - Dentition:  (pt essentially edentulous, has 2 teeth) Self-Feeding Abilities: Able to feed self Patient Positioning: Upright in chair/Tumbleform Baseline Vocal Quality: Normal Volitional Cough: Strong Volitional Swallow: Able to elicit    Oral/Motor/Sensory Function Overall Oral Motor/Sensory Function: Appears within functional limits for tasks assessed   Ice Chips Ice chips: Not tested   Thin Liquid Thin Liquid: Within functional limits Presentation: Cup    Nectar Thick Nectar Thick Liquid: Not tested   Honey Thick Honey Thick Liquid: Not tested   Puree Puree: Not tested   Solid   GO Functional Assessment Tool Used: clinical judgement Functional Limitations: Swallowing Swallow Current Status (Z6109): At least 1 percent but less than 20 percent impaired, limited or restricted Swallow Goal Status [phone]): At least 1 percent but less than 20 percent impaired, limited or restricted  Solid: Within functional limits Presentation: Self Fed;Spoon       Donavan Burnet, MS [facility] SLP [phone]

## 2015-02-14 NOTE — Discharge Instructions (Signed)
Bloating Bloating is the feeling of fullness in your belly. You may feel as though your pants are too tight. Often the cause of bloating is overeating, retaining fluids, or having gas in your bowel. It is also caused by swallowing air and eating foods that cause gas. Irritable bowel syndrome is one of the most common causes of bloating. Constipation is also a common cause. Sometimes more serious problems can cause bloating. SYMPTOMS  Usually there is a feeling of fullness, as though your abdomen is bulged out. There may be mild discomfort.  DIAGNOSIS  Usually no particular testing is necessary for most bloating. If the condition persists and seems to become worse, your caregiver may do additional testing.  TREATMENT   There is no direct treatment for bloating.  Do not put gas into the bowel. Avoid chewing gum and sucking on candy. These tend to make you swallow air. Swallowing air can also be a nervous habit. Try to avoid this.  Avoiding high residue diets will help. Eat foods with soluble fibers (examples include root vegetables, apples, or barley) and substitute dairy products with soy and rice products. This helps irritable bowel syndrome.  If constipation is the cause, then a high residue diet with more fiber will help.  Avoid carbonated beverages.  Over-the-counter preparations are available that help reduce gas. Your pharmacist can help you with this. SEEK MEDICAL CARE IF:   Bloating continues and seems to be getting worse.  You notice a weight gain.  You have a weight loss but the bloating is getting worse.  You have changes in your bowel habits or develop nausea or vomiting. SEEK IMMEDIATE MEDICAL CARE IF:   You develop shortness of breath or swelling in your legs.  You have an increase in abdominal pain or develop chest pain. Document Released: 04/08/2006 Document Revised: 08/31/2011 Document Reviewed: 05/27/2007 Exit[employer] Patient Information 2015 [employer]. This  information is not intended to replace advice given to you by your health care provider. Make sure you discuss any questions you have with your health care provider.  

## 2015-03-07 ENCOUNTER — Emergency Department (HOSPITAL_COMMUNITY)
Admission: EM | Admit: 2015-03-07 | Discharge: 2015-03-07 | Disposition: A | Discharge status: Home / Self Care | Payer: Medicare Other | Attending: Emergency Medicine | Admitting: Emergency Medicine

## 2015-03-07 ENCOUNTER — Emergency Department (HOSPITAL_COMMUNITY): Payer: Medicare Other

## 2015-03-07 ENCOUNTER — Encounter (HOSPITAL_COMMUNITY): Payer: Self-pay | Admitting: Emergency Medicine

## 2015-03-07 DIAGNOSIS — Z87440 Personal history of urinary (tract) infections: Secondary | ICD-10-CM | POA: Insufficient documentation

## 2015-03-07 DIAGNOSIS — Z79899 Other long term (current) drug therapy: Secondary | ICD-10-CM | POA: Insufficient documentation

## 2015-03-07 DIAGNOSIS — J449 Chronic obstructive pulmonary disease, unspecified: Secondary | ICD-10-CM | POA: Diagnosis not present

## 2015-03-07 DIAGNOSIS — N183 Chronic kidney disease, stage 3 (moderate): Secondary | ICD-10-CM | POA: Insufficient documentation

## 2015-03-07 DIAGNOSIS — R0789 Other chest pain: Secondary | ICD-10-CM | POA: Diagnosis not present

## 2015-03-07 DIAGNOSIS — J441 Chronic obstructive pulmonary disease with (acute) exacerbation: Secondary | ICD-10-CM | POA: Diagnosis not present

## 2015-03-07 DIAGNOSIS — Z86018 Personal history of other benign neoplasm: Secondary | ICD-10-CM | POA: Diagnosis not present

## 2015-03-07 DIAGNOSIS — R110 Nausea: Secondary | ICD-10-CM | POA: Insufficient documentation

## 2015-03-07 DIAGNOSIS — M79602 Pain in left arm: Secondary | ICD-10-CM | POA: Insufficient documentation

## 2015-03-07 DIAGNOSIS — R5383 Other fatigue: Secondary | ICD-10-CM | POA: Diagnosis not present

## 2015-03-07 DIAGNOSIS — Z8742 Personal history of other diseases of the female genital tract: Secondary | ICD-10-CM | POA: Diagnosis not present

## 2015-03-07 DIAGNOSIS — R42 Dizziness and giddiness: Secondary | ICD-10-CM | POA: Diagnosis not present

## 2015-03-07 DIAGNOSIS — Z8719 Personal history of other diseases of the digestive system: Secondary | ICD-10-CM | POA: Diagnosis not present

## 2015-03-07 DIAGNOSIS — Z87891 Personal history of nicotine dependence: Secondary | ICD-10-CM | POA: Diagnosis not present

## 2015-03-07 DIAGNOSIS — M6281 Muscle weakness (generalized): Secondary | ICD-10-CM | POA: Diagnosis not present

## 2015-03-07 DIAGNOSIS — Z8639 Personal history of other endocrine, nutritional and metabolic disease: Secondary | ICD-10-CM | POA: Insufficient documentation

## 2015-03-07 DIAGNOSIS — H409 Unspecified glaucoma: Secondary | ICD-10-CM | POA: Insufficient documentation

## 2015-03-07 DIAGNOSIS — R079 Chest pain, unspecified: Secondary | ICD-10-CM | POA: Insufficient documentation

## 2015-03-07 DIAGNOSIS — Z7982 Long term (current) use of aspirin: Secondary | ICD-10-CM | POA: Diagnosis not present

## 2015-03-07 DIAGNOSIS — R531 Weakness: Secondary | ICD-10-CM | POA: Diagnosis not present

## 2015-03-07 DIAGNOSIS — J984 Other disorders of lung: Secondary | ICD-10-CM | POA: Diagnosis not present

## 2015-03-07 DIAGNOSIS — I129 Hypertensive chronic kidney disease with stage 1 through stage 4 chronic kidney disease, or unspecified chronic kidney disease: Secondary | ICD-10-CM | POA: Insufficient documentation

## 2015-03-07 LAB — I-STAT TROPONIN, ED
TROPONIN I, POC: 0 ng/mL (ref 0.00–0.08)
Troponin i, poc: 0.01 ng/mL (ref 0.00–0.08)

## 2015-03-07 LAB — CBC
HEMATOCRIT: 37.4 % (ref 36.0–46.0)
Hemoglobin: 11.9 g/dL — ABNORMAL LOW (ref 12.0–15.0)
MCH: 29.8 pg (ref 26.0–34.0)
MCHC: 31.8 g/dL (ref 30.0–36.0)
MCV: 93.7 fL (ref 78.0–100.0)
Platelets: 187 10*3/uL (ref 150–400)
RBC: 3.99 MIL/uL (ref 3.87–5.11)
RDW: 14.5 % (ref 11.5–15.5)
WBC: 5.1 10*3/uL (ref 4.0–10.5)

## 2015-03-07 LAB — BASIC METABOLIC PANEL
Anion gap: 6 (ref 5–15)
BUN: 13 mg/dL (ref 6–20)
CHLORIDE: 102 mmol/L (ref 101–111)
CO2: 32 mmol/L (ref 22–32)
Calcium: 9.6 mg/dL (ref 8.9–10.3)
Creatinine, Ser: 1.03 mg/dL — ABNORMAL HIGH (ref 0.44–1.00)
GFR calc Af Amer: 58 mL/min — ABNORMAL LOW (ref >60)
GFR calc non Af Amer: 50 mL/min — ABNORMAL LOW (ref >60)
GLUCOSE: 90 mg/dL (ref 65–99)
POTASSIUM: 4.8 mmol/L (ref 3.5–5.1)
Sodium: 140 mmol/L (ref 135–145)

## 2015-03-07 MED ORDER — ACETAMINOPHEN 500 MG PO TABS: 500.0000 mg | ORAL_TABLET | Freq: Four times a day (QID) | ORAL | Status: DC | PRN

## 2015-03-07 NOTE — Progress Notes (Signed)
[facility] consulted by EDPA regarding patient not having a pcp.  Patient lisyed as having Medicare and Medicaid insurance.  Patient has been seen multiple times by day shift [facility] and has provided patient with phone number and address to patient's pcp listed on patient's Medicaid card which is [facility] on [address] in [place] [phone].  EDCM spoke to patient and family members at bedside and provided patient with this information again.  Patient became upset stating, "how am I supposed to see them in [place] when I live in [place]."  [facility] explained to patient if she wishes to change her pcp on her Medicaid card she must contact the Department of social Services.  Patient and female at bedside verablized understanding.   Discussed importance of having a pcp.  EDCM also provided patient with list of pcps who accept Medicare insurnace within a 10 mile radius of patient's zip code [address].  Patient thankful for services.  No further EDCM needs at this time.

## 2015-03-07 NOTE — Discharge Instructions (Signed)
1. Medications: usual home medications 2. Treatment: rest, drink plenty of fluids 3. Follow Up: please followup with your primary doctor and cardiologist for discussion of your diagnoses and further evaluation after today's visit; if you do not have a primary care doctor use the resource guide provided to find one; please return to the ER for severe chest pain, shortness of breath, high fever, persistent vomiting, dizziness, syncope   Chest Pain (Nonspecific) It is often hard to give a specific diagnosis for the cause of chest pain. There is always a chance that your pain could be related to something serious, such as a heart attack or a blood clot in the lungs. You need to follow up with your health care provider for further evaluation. CAUSES   Heartburn.  Pneumonia or bronchitis.  Anxiety or stress.  Inflammation around your heart (pericarditis) or lung (pleuritis or pleurisy).  A blood clot in the lung.  A collapsed lung (pneumothorax). It can develop suddenly on its own (spontaneous pneumothorax) or from trauma to the chest.  Shingles infection (herpes zoster virus). The chest wall is composed of bones, muscles, and cartilage. Any of these can be the source of the pain.  The bones can be bruised by injury.  The muscles or cartilage can be strained by coughing or overwork.  The cartilage can be affected by inflammation and become sore (costochondritis). DIAGNOSIS  Lab tests or other studies may be needed to find the cause of your pain. Your health care provider may have you take a test called an ambulatory electrocardiogram (ECG). An ECG records your heartbeat patterns over a 24-hour period. You may also have other tests, such as:  Transthoracic echocardiogram (TTE). During echocardiography, sound waves are used to evaluate how blood flows through your heart.  Transesophageal echocardiogram (TEE).  Cardiac monitoring. This allows your health care provider to monitor your heart  rate and rhythm in real time.  Holter monitor. This is a portable device that records your heartbeat and can help diagnose heart arrhythmias. It allows your health care provider to track your heart activity for several days, if needed.  Stress tests by exercise or by giving medicine that makes the heart beat faster. TREATMENT   Treatment depends on what may be causing your chest pain. Treatment may include:  Acid blockers for heartburn.  Anti-inflammatory medicine.  Pain medicine for inflammatory conditions.  Antibiotics if an infection is present.  You may be advised to change lifestyle habits. This includes stopping smoking and avoiding alcohol, caffeine, and chocolate.  You may be advised to keep your head raised (elevated) when sleeping. This reduces the chance of acid going backward from your stomach into your esophagus. Most of the time, nonspecific chest pain will improve within 2-3 days with rest and mild pain medicine.  HOME CARE INSTRUCTIONS   If antibiotics were prescribed, take them as directed. Finish them even if you start to feel better.  For the next few days, avoid physical activities that bring on chest pain. Continue physical activities as directed.  Do not use any tobacco products, including cigarettes, chewing tobacco, or electronic cigarettes.  Avoid drinking alcohol.  Only take medicine as directed by your health care provider.  Follow your health care provider's suggestions for further testing if your chest pain does not go away.  Keep any follow-up appointments you made. If you do not go to an appointment, you could develop lasting (chronic) problems with pain. If there is any problem keeping an appointment, call to  reschedule. SEEK MEDICAL CARE IF:   Your chest pain does not go away, even after treatment.  You have a rash with blisters on your chest.  You have a fever. SEEK IMMEDIATE MEDICAL CARE IF:   You have increased chest pain or pain that  spreads to your arm, neck, jaw, back, or abdomen.  You have shortness of breath.  You have an increasing cough, or you cough up blood.  You have severe back or abdominal pain.  You feel nauseous or vomit.  You have severe weakness.  You faint.  You have chills. This is an emergency. Do not wait to see if the pain will go away. Get medical help at once. Call your local emergency services (911 in U.S[place] Do not drive yourself to the hospital. MAKE SURE YOU:   Understand these instructions.  Will watch your condition.  Will get help right away if you are not doing well or get worse. Document Released: 03/18/2005 Document Revised: 06/13/2013 Document Reviewed: 01/12/2008 [facility] Patient Information 2015 [place], [place]. This information is not intended to replace advice given to you by your health care provider. Make sure you discuss any questions you have with your health care provider.   Emergency Department Resource Guide 1) Find a Doctor and Pay Out of Pocket Although you won't have to find out who is covered by your insurance plan, it is a good idea to ask around and get recommendations. You will then need to call the office and see if the doctor you have chosen will accept you as a new patient and what types of options they offer for patients who are self-pay. Some doctors offer discounts or will set up payment plans for their patients who do not have insurance, but you will need to ask so you aren't surprised when you get to your appointment.  2) Contact Your Local Health Department Not all health departments have doctors that can see patients for sick visits, but many do, so it is worth a call to see if yours does. If you don't know where your local health department is, you can check in your phone book. The CDC also has a tool to help you locate your state's health department, and many state websites also have listings of all of their local health departments.  3) Find a  Walk-in Clinic If your illness is not likely to be very severe or complicated, you may want to try a walk in clinic. These are popping up all over the country in pharmacies, drugstores, and shopping centers. They're usually staffed by nurse practitioners or physician assistants that have been trained to treat common illnesses and complaints. They're usually fairly quick and inexpensive. However, if you have serious medical issues or chronic medical problems, these are probably not your best option.  No Primary Care Doctor: - Call Health Connect at  [phone] - they can help you locate a primary care doctor that  accepts your insurance, provides certain services, etc. - Physician Referral Service- [phone]  Chronic Pain Problems: Organization         Address  Phone   Notes  [facility]  [phone] Patients need to be referred by their primary care doctor.   Medication Assistance: Organization         Address  Phone   Notes  [facility] [address] [place], [place] [address] [phone] --Must be a resident of [facility] -- Must have  NO insurance coverage whatsoever (no Medicaid/ Medicare, etc.) -- The pt. MUST have a primary care doctor that directs their care regularly and follows them in the community   [facility]  [phone]   [employer]  [phone]    Agencies that provide inexpensive medical care: Organization         Address  Phone   Notes  [facility]  [phone]   [facility]    [phone]   [facility] [address] [place], [place] [address] [phone]   [facility] [address] [place], [place] [phone]   Planned Parenthood    [phone]   [facility]    [phone]   Community Health and [facility]  [address], [place] Phone:  [phone], Fax:  [phone] Hours of Operation:  9 am - 6 pm, M-F.  Also accepts Medicaid/Medicare and self-pay.  [facility]  [address], [place] Phone: [phone], Fax: [phone]. Hours of Operation:  8:30 am - 5:30 pm, M-F.  Also accepts Medicaid and self-pay.  [facility] [place] [address] [place] Phone: [phone]   [facility] [address] Natasha Bence [place], [place] [phone], Ext. [phone] Mondays & Thursdays: 7-9 AM.  First 15 patients are seen on a first come, first serve basis.    Medicaid-accepting [facility] Providers:  Organization         Address  Phone   Notes  [facility] [address], [place] [phone] Also accepts self-pay patients.  [facility] [address] Laurell Josephs [place], [place]  [phone]   [facility] [address], [place] [phone]   [facility] [address] [place] [phone]   Renaye Rakers [address], [place]   [phone] Only accepts [place] Access [place] patients after they have their name applied to their card.   Self-Pay (no insurance) in [facility]:  Organization         Address  Phone   Notes  Sickle Cell Patients, [facility] [address] [place], [place] [phone]   [facility] [address] [place], [place] [phone]   [facility] [place] [facility]  [address], [place] [phone]   [facility]/Dr. Osei-Bonsu  [address] [employer] or [address], [place] [phone] Phone number for both [place] and [place] locations is the same.  [facility] [address] [place] [phone]   [facility] [address] [place] or [address] [phone]     [facility] [address], [place] [phone], phone; [phone], fax Sees patients 1st and 3rd Saturday of every month.  Must not qualify for public or private insurance (i.e. Medicaid, Medicare, [place] Health Choice, Veterans' Benefits)  Household income should be no more than 200% of the poverty level The clinic cannot treat you if you are pregnant or think you are pregnant  Sexually transmitted diseases are not treated at the clinic.    Dental Care: Organization         Address  Phone  Notes  [facility] Department of [facility] [address] [place], [place] [phone] Accepts children up to age 82 who are enrolled in [place] or [place] Health Choice; pregnant women with a Medicaid card; and children who have applied for Medicaid or [place] Health Choice, but were declined, whose parents can pay a reduced fee at time of service.  [facility] Department of [facility]  [address], [place] [phone] Accepts children up to age 66 who are enrolled in [place] or [place] Health Choice; pregnant women with a Medicaid card; and children who have applied for Medicaid or [place] Health Choice, but were declined, whose parents can pay a reduced fee at time of service.  [facility]  [address] [place], [place] [phone] Patients are seen by appointment only. Walk-ins are not accepted. [facility] will see patients 52 years of age and older. Monday - Tuesday (8am-5pm) Most Wednesdays (8:30-5pm) $30 per visit, cash only  [facility] Adult Dental Access PROGRAM  [address], [facility] [phone] Patients are seen by appointment only. Walk-ins are not accepted. [facility] will see patients 40 years of age and older. One Wednesday Evening (Monthly: Volunteer Based).  $30 per visit, cash only  [employer]  [phone] for adults; Children under age 36, call  [facility] at [phone]. Children aged 47-14, please call [phone] to request a pediatric application.  Dental services are provided in all areas of dental care including fillings, crowns and bridges, complete and partial dentures, implants, gum treatment, root canals, and extractions. Preventive care is also provided. Treatment is provided to both adults and children. Patients are selected via a lottery and there is often a waiting list.   [facility] [address] [place]  [phone] [email or web address]   [facility] [address] [place], [place] [phone], Ext. [phone] Second and Fourth Thursday of each month, opens at 6:30 AM; Clinic ends at 9 AM.  Patients are seen on a first-come first-served basis, and a limited number are seen during each clinic.   [facility]  [address] Ether Griffins [place], [place] [phone]   Eligibility Requirements You must have lived in [place], [place], or [place] counties for at least the last three months.   You cannot be eligible for state or federal sponsored [place], including [employer], [place], or [employer].   You generally cannot be eligible for healthcare insurance through your employer.    How to apply: Eligibility screenings are held every Tuesday and Wednesday afternoon from 1:00 pm until 4:00 pm. You do not need an appointment for the interview!  [facility] [address], [place], [place] [phone]   [facility] Health Department  [phone]   [facility] Health Department  [phone]   [facility] Health Department  [phone]    Behavioral Health Resources in the Community: Intensive Outpatient Programs Organization         Address  Phone  Notes  [facility] [address], [place], [place] [phone]   [facility] Outpatient [address] [employer], [place]  [phone]   [facility] [address], [place], [place]  [phone]   [facility] Mental Health [address],  [place], [place] [phone] or [phone]   Substance Abuse Resources Organization  Address  Phone  Notes  [facility]  [phone]   [facility]  [phone]   [facility]  [phone]   Floydene Flock  [phone]   Residential & Outpatient Substance Abuse Program  [phone]   Psychological Services Organization         Address  Phone  Notes  [facility] Behavioral Health  [phone]   [facility]  [phone]   [facility] [address] [place] [phone] or [phone]    Mobile Crisis Teams Organization         Address  Phone  Notes  [facility], Mobile Crisis Care Unit  [phone]   [facility]  [address] [place], [place] [phone]   Doristine Locks [address] [place] [phone]    Self-Help/Support Groups Organization         Address  Phone             Notes  [facility] [place] - variety of support groups  [phone]- I7437963 Call for more information  Narcotics Anonymous (NA), [facility] [address], [employer] [place]  2 meetings at this location   Statistician         Address  Phone  Notes  [facility] [address],    [place]  [phone]   [facility]  [address], [place] [address], [place], [place] [phone]   [facility] [address] [place], [place] [phone] Admissions: 8am-3pm M-F  [facility] [address]    [place], [place] [phone]   [facility] [address] [place], [place], [place] [phone]   [facility] [address]  [place], [place] [phone]   [facility] - Intensive Outpatient [address] Laurell Josephs [address], [place], [place] [phone]   [facility] ([facility] [address]  [place], [place] [phone] or [phone]   [facility] ([facility]) [address], [place], [place] [phone] Accepts Medicaid  [facility] [address]  [place] [phone] Substance Abuse/Addiction Treatment   [facility] Organization         Address  Phone  Notes  [facility]  [phone]   Angie Fava, PhD [address] Ervin Knack [place], [place]   [phone] or [phone]   [facility]al   [address] [place], [place] [phone]   [facility] [address], [place], [place] [phone] Insurance/Medicaid/sponsorship through [facility] and Families [address]                                    [place], [place] [phone] Therapy/tele-psych/case  [facility] [address], [place] [phone]    Dr. Lolly Mustache  [phone]   [facility] [place]  [employer] [facility] Dept. 1) [address] [place] 2) [address] [place] 3)  [address], [place] [phone]  [phone]   [facility] Child Abuse Hotline (415)[phone] or [phone] (After Hours)

## 2015-03-07 NOTE — ED Provider Notes (Signed)
CSN: [id number]     Arrival date & time 03/07/15  1000 History   None    Chief Complaint  Patient presents with  . Chest Pain    "tightness"  . Arm Pain    left    HPI   Amber Rowland is a 81 y.o. female with a PMH of COPD, hypertension, CKD who presents to the ED with chest pain radiating to her left upper extremity, which started 2 weeks ago. She reports gradually worsening pain since that time, and characterizes her pain as achy and sore. She states her pain is intermittent, and that it is worse when she wakes up in the morning and improves with activity throughout the day. She also reports shortness of breath. She denies fever, chills, lightheadedness, loss of consciousness, abdominal pain, vomiting, diarrhea. She reports intermittent nausea and dizziness.   Past Medical History  Diagnosis Date  . Glaucoma (increased eye pressure)   . Ovarian cyst, right 2009    s/p BSO 2009: OVARIAN FIBROTHECOMA in multiple fluid filled cysts  4.5cm region  . Atrophy of left kidney     Chronic atrophy of the left kidney.  . Chronic constipation   . COPD (chronic obstructive pulmonary disease)     Hyperinflation and heavy smoking history strongly  . HTN (hypertension) 04/12/2012  . Benign breast cysts in female 04/12/2012    Seen on mammography 2009   . UTI (lower urinary tract infection)     History of chronic recurrent UTIs  . Malnutrition 04/28/2012  . Ileus following gastrointestinal surgery 04/28/2012  . SBO (small bowel obstruction)   . Severe malnutrition 09/16/2013  . Ejection fraction   . Stage III chronic kidney disease 02/13/2015  . Esophageal dysmotility 02/14/2015   Past Surgical History  Procedure Laterality Date  . Bilateral oophorectomy  2009    PROCEDURE:  diagnostic laparoscopy, laparotomy with bilateral salpingo-  . Laparotomy  04/23/2012    Procedure: EXPLORATORY LAPAROTOMY;  Surgeon: Valarie Merino, MD;  Location: [facility];  Service: General;  Laterality: N/A;   enterolysis    Family History  Problem Relation Age of Onset  . Diabetes Son   . High blood pressure Son   . Coronary artery disease Son    Social History  Substance Use Topics  . Smoking status: Former Smoker -- 0.80 packs/day for 60 years    Types: Cigarettes    Quit date: 03/20/2014  . Smokeless tobacco: Never Used  . Alcohol Use: No   OB History    No data available     Review of Systems  Constitutional: Positive for fatigue. Negative for fever, chills, activity change and appetite change.  HENT: Negative for congestion.   Respiratory: Positive for chest tightness and shortness of breath. Negative for cough.   Cardiovascular: Positive for chest pain. Negative for leg swelling.  Gastrointestinal: Positive for nausea. Negative for vomiting, abdominal pain, diarrhea and constipation.  Genitourinary: Negative for dysuria, urgency and frequency.  Musculoskeletal: Negative for myalgias and arthralgias.  Neurological: Positive for dizziness and weakness. Negative for syncope, light-headedness, numbness and headaches.       Reports generalized weakness.  All other systems reviewed and are negative.     Allergies  Review of patient's allergies indicates no known allergies.  Home Medications   Prior to Admission medications   Medication Sig Start Date End Date Taking? Authorizing Provider  amLODipine (NORVASC) 5 MG tablet Take 1 tablet (5 mg total) by mouth daily. 02/14/15  Yes  Maryruth Bun Rama, MD  aspirin 325 MG tablet Take 325 mg by mouth daily.   Yes Historical Provider, MD  Travoprost, BAK Free, (TRAVATAN) 0.004 % SOLN ophthalmic solution Place 1 drop into both eyes at bedtime.    Yes Historical Provider, MD  vitamin B-12 1000 MCG tablet Take 1 tablet (1,000 mcg total) by mouth daily. 09/18/13  Yes Renae Fickle, MD  feeding supplement, ENSURE ENLIVE, (ENSURE ENLIVE) LIQD Take 237 mLs by mouth 2 (two) times daily between meals. Patient not taking: Reported on 03/07/2015  02/14/15   Maryruth Bun Rama, MD  omeprazole (PRILOSEC) 20 MG capsule Take 1 capsule (20 mg total) by mouth daily. Patient not taking: Reported on 03/07/2015 02/14/15   Maryruth Bun Rama, MD  ondansetron (ZOFRAN) 4 MG tablet Take 1 tablet (4 mg total) by mouth every 6 (six) hours as needed for nausea. Patient not taking: Reported on 03/07/2015 02/14/15   Maryruth Bun Rama, MD  polyethylene glycol (MIRALAX / GLYCOLAX) packet Take 17 g by mouth daily. Patient not taking: Reported on 03/07/2015 02/14/15   Maryruth Bun Rama, MD    BP 157/85 mmHg  Pulse 52  Temp(Src) 98.6 F (37 C) (Oral)  Resp 13  Ht 5' (1.524 m)  Wt 80 lb (36.288 kg)  BMI 15.62 kg/m2  SpO2 96% Physical Exam  Constitutional: She is oriented to person, place, and time. She appears well-nourished. No distress.  Thin female in no acute distress.  HENT:  Head: Normocephalic and atraumatic.  Right Ear: External ear normal.  Left Ear: External ear normal.  Nose: Nose normal.  Mouth/Throat: Uvula is midline, oropharynx is clear and moist and mucous membranes are normal.  Eyes: Conjunctivae, EOM and lids are normal. Pupils are equal, round, and reactive to light. Right eye exhibits no discharge. Left eye exhibits no discharge. No scleral icterus.  Neck: Normal range of motion. Neck supple.  Cardiovascular: Normal rate, regular rhythm, normal heart sounds, intact distal pulses and normal pulses.   Pulmonary/Chest: Effort normal and breath sounds normal. No respiratory distress. She has no wheezes. She has no rales. She exhibits tenderness.  Mild tenderness to palpation of left anterior chest wall.  Abdominal: Soft. Normal appearance and bowel sounds are normal. She exhibits no distension and no mass. There is no tenderness. There is no rigidity, no rebound and no guarding.  Musculoskeletal: Normal range of motion. She exhibits no edema or tenderness.  Full range of motion of upper extremities bilaterally.  Neurological: She is alert and  oriented to person, place, and time. She has normal strength. No sensory deficit.  Strength and sensation intact.  Skin: Skin is warm, dry and intact. No rash noted. She is not diaphoretic. No erythema. No pallor.  Psychiatric: She has a normal mood and affect. Her speech is normal and behavior is normal. Judgment and thought content normal.  Nursing note and vitals reviewed.   ED Course  Procedures (including critical care time)  Labs Review Labs Reviewed  BASIC METABOLIC PANEL - Abnormal; Notable for the following:    Creatinine, Ser 1.03 (*)    GFR calc non Af Amer 50 (*)    GFR calc Af Amer 58 (*)    All other components within normal limits  CBC - Abnormal; Notable for the following:    Hemoglobin 11.9 (*)    All other components within normal limits  I-STAT TROPOININ, ED  Rosezena Sensor, ED    Imaging Review Dg Chest 2 View  03/07/2015  CLINICAL DATA:  chest pain  Mid chest pain/tightness with lt arm pain for approx 4 weeks, prev smokes, states hx of copd, no other chest complaints  EXAM: CHEST  2 VIEW  COMPARISON:  12/29/2014  FINDINGS: Cardiac silhouette is mildly enlarged thrombus stable. No mediastinal or hilar masses or adenopathy are evident.  Lungs are hyperexpanded. There is stable scarring in the upper lobes. No lung consolidation or edema. No pleural effusion or pneumothorax.  Skeletal structures are demineralized.  IMPRESSION: No acute cardiopulmonary disease. COPD. Stable lung scarring and cardiomegaly.   Electronically Signed   By: Amie Portland M.D.   On: 03/07/2015 11:24   I have personally reviewed and evaluated these images and lab results as part of my medical decision-making.   EKG Interpretation   Date/Time:  Thursday March 07 2015 10:09:22 EDT Ventricular Rate:  73 PR Interval:  158 QRS Duration: 86 QT Interval:  380 QTC Calculation: 419 R Axis:   -51 Text Interpretation:  Sinus rhythm Probable left atrial enlargement Left  axis deviation  Anterior infarct, old No significant change since last  tracing Confirmed by Tyrone Apple ([id number]) on 03/07/2015 4:07:54 PM      MDM   Final diagnoses:  Chest pain, unspecified chest pain type   81 year old female presents with chest pain radiating to her left upper extremity. She states her pain started 2 weeks ago and has become progressively worse. She reports associated shortness of breath, nausea, and dizziness. Denies fever, chills, lightheadedness, loss of consciousness, abdominal pain, vomiting, diarrhea. She reports intermittent nausea and dizziness. Patient evaluated in the ED 02/11/2015 for chest pain radiated into her back. CT angiogram chest obtained,  which was negative for aortic dissection or aneurysm; CT angio abdomen pelvis obtained, which demonstrated small irregularity along infrarenal abdominal aorta. Patient was admitted, and her chest pain was thought to be secondary to esophageal dysmotility/dysphasia, as esophagram showed findings consistent with esophageal dysmotility. Patient had an abdominal ultrasound to evaluate irregularity of infrarenal abdominal aorta seen on CT, which identified small mural thrombus and plaque.This was thought to be an incidental finding, and did not require further workup.   Had negative nuclear stress test 09/18/2013. Has been seen in the ED on several occasions with left upper extremity pain, thought to be due to radiculopathy in the setting of degenerative disc disease of cervical spine.  Patient is afebrile. Vital signs stable. No tachycardia, O2 sat 96% on room air. Heart regular rate and rhythm. Mild tenderness to palpation of left anterior chest wall. Lungs clear to auscultation bilaterally. Abdomen soft, nontender, nondistended. Full range of motion of left upper extremity. No lower extremity edema.  CBC with hemoglobin 11.9, negative for leukocytosis. BMP with creatinine 1.03. EKG no acute ischemia. Troponin negative x 1. Chest x-ray with  COPD, stable lung scarring, and cardiomegaly; no acute cardiopulmonary disease.  Repeat troponin obtained. Troponin negative.  Cardiology consulted. Spoke with Dr. Clifton James, who felt given negative cardiac workup in the ED, the patient is stable for discharge with cardiology follow-up. Patient is currently asymptomatic, and denies chest pain and shortness of breath. She is to be seen in cardiology clinic next week. Return precautions discussed.  BP 157/85 mmHg  Pulse 52  Temp(Src) 98.6 F (37 C) (Oral)  Resp 13  Ht 5' (1.524 m)  Wt 80 lb (36.288 kg)  BMI 15.62 kg/m2  SpO2 96%     ZARI CLY, PA-C 03/08/15 0145  Leta Baptist, MD 03/08/15 [phone]

## 2015-03-07 NOTE — ED Notes (Signed)
Patient c/o chest tightness, and left arm pain. Patient states she does not have a PCP. Patient reports SOB but is able to speak in complete sentences without distress. Patient states this pain is ongoing and has been present since she was seen the last time (August 2016).

## 2015-03-07 NOTE — ED Notes (Signed)
Patient's daughter called for pt transport.

## 2015-10-03 DIAGNOSIS — H401132 Primary open-angle glaucoma, bilateral, moderate stage: Secondary | ICD-10-CM | POA: Diagnosis not present

## 2016-02-14 ENCOUNTER — Ambulatory Visit (HOSPITAL_COMMUNITY)
Admission: EM | Admit: 2016-02-14 | Discharge: 2016-02-14 | Disposition: A | Discharge status: Home / Self Care | Payer: Medicare Other | Attending: Family Medicine | Admitting: Family Medicine

## 2016-02-14 ENCOUNTER — Encounter (HOSPITAL_COMMUNITY): Payer: Self-pay | Admitting: Family Medicine

## 2016-02-14 DIAGNOSIS — R030 Elevated blood-pressure reading, without diagnosis of hypertension: Secondary | ICD-10-CM

## 2016-02-14 DIAGNOSIS — IMO0001 Reserved for inherently not codable concepts without codable children: Secondary | ICD-10-CM

## 2016-02-14 MED ORDER — AMLODIPINE BESYLATE 5 MG PO TABS: 5.0000 mg | ORAL_TABLET | Freq: Every day | ORAL | 1 refills | Status: DC

## 2016-02-14 NOTE — ED Provider Notes (Signed)
CSN: [id number]     Arrival date & time 02/14/16  1238 History   First MD Initiated Contact with Patient 02/14/16 1320     Chief Complaint  Patient presents with  . Hypertension   (Consider location/radiation/quality/duration/timing/severity/associated sxs/prior Treatment) HPI Patient is an 82 year old female who has not had her amlodipine for one month. She presents now with elevated blood pressure. She also states that she has had a bit of a headache. It is not keeping her from doing her normal functions he has no other symptoms at this time. She is in the process of finding a new provider. Past Medical History:  Diagnosis Date  . Atrophy of left kidney    Chronic atrophy of the left kidney.  . Benign breast cysts in female 04/12/2012   Seen on mammography 2009   . Chronic constipation   . COPD (chronic obstructive pulmonary disease) (HCC)    Hyperinflation and heavy smoking history strongly  . Ejection fraction   . Esophageal dysmotility 02/14/2015  . Glaucoma (increased eye pressure)   . HTN (hypertension) 04/12/2012  . Ileus following gastrointestinal surgery 04/28/2012  . Malnutrition (HCC) 04/28/2012  . Ovarian cyst, right 2009   s/p BSO 2009: OVARIAN FIBROTHECOMA in multiple fluid filled cysts  4.5cm region  . SBO (small bowel obstruction) (HCC)   . Severe malnutrition (HCC) 09/16/2013  . Stage III chronic kidney disease 02/13/2015  . UTI (lower urinary tract infection)    History of chronic recurrent UTIs   Past Surgical History:  Procedure Laterality Date  . BILATERAL OOPHORECTOMY  2009   PROCEDURE:  diagnostic laparoscopy, laparotomy with bilateral salpingo-  . LAPAROTOMY  04/23/2012   Procedure: EXPLORATORY LAPAROTOMY;  Surgeon: Valarie MerinoMatthew B Martin, MD;  Location: [facility];  Service: General;  Laterality: N/A;  enterolysis    Family History  Problem Relation Age of Onset  . Diabetes Son   . High blood pressure Son   . Coronary artery disease Son    Social History   Substance Use Topics  . Smoking status: Former Smoker    Packs/day: 0.80    Years: 60.00    Types: Cigarettes    Quit date: 03/20/2014  . Smokeless tobacco: Never Used  . Alcohol use No   OB History    No data available     Review of Systems  Denies: HEADACHE, NAUSEA, ABDOMINAL PAIN, CHEST PAIN, CONGESTION, DYSURIA, SHORTNESS OF BREATH  Allergies  Review of patient's allergies indicates no known allergies.  Home Medications   Prior to Admission medications   Medication Sig Start Date End Date Taking? Authorizing Provider  acetaminophen (TYLENOL) 500 MG tablet Take 1 tablet (500 mg total) by mouth every 6 (six) hours as needed. 03/07/15   Mady GemmaElizabeth C Westfall, PA-C  amLODipine (NORVASC) 5 MG tablet Take 1 tablet (5 mg total) by mouth daily. 02/14/16   Tharon AquasFrank C Patrick, PA  aspirin 325 MG tablet Take 325 mg by mouth daily.    Historical Provider, MD  feeding supplement, ENSURE ENLIVE, (ENSURE ENLIVE) LIQD Take 237 mLs by mouth 2 (two) times daily between meals. Patient not taking: Reported on 03/07/2015 02/14/15   Maryruth Bunhristina P Rama, MD  omeprazole (PRILOSEC) 20 MG capsule Take 1 capsule (20 mg total) by mouth daily. Patient not taking: Reported on 03/07/2015 02/14/15   Maryruth Bunhristina P Rama, MD  ondansetron (ZOFRAN) 4 MG tablet Take 1 tablet (4 mg total) by mouth every 6 (six) hours as needed for nausea. Patient not taking: Reported on 03/07/2015  02/14/15   Maryruth Bun Rama, MD  polyethylene glycol (MIRALAX / GLYCOLAX) packet Take 17 g by mouth daily. Patient not taking: Reported on 03/07/2015 02/14/15   Maryruth Bun Rama, MD  Travoprost, BAK Free, (TRAVATAN) 0.004 % SOLN ophthalmic solution Place 1 drop into both eyes at bedtime.     Historical Provider, MD  vitamin B-12 1000 MCG tablet Take 1 tablet (1,000 mcg total) by mouth daily. 09/18/13   Renae Fickle, MD   Meds Ordered and Administered this Visit  Medications - No data to display  BP (!) 194/111 (BP Location: Right Arm) Comment:  notified rn  Pulse 73   Temp 98.3 F (36.8 C) (Oral)   Resp 16   SpO2 98%  No data found.   Physical Exam NURSES NOTES AND VITAL SIGNS REVIEWED. CONSTITUTIONAL: Well developed, well nourished, no acute distress HEENT: normocephalic, atraumatic EYES: Conjunctiva normal NECK:normal ROM, supple, no adenopathy PULMONARY:No respiratory distress, normal effort ABDOMINAL: Soft, ND, NT BS+, No CVAT MUSCULOSKELETAL: Normal ROM of all extremities,  SKIN: warm and dry without rash PSYCHIATRIC: Mood and affect, behavior are normal  Urgent Care Course   Clinical Course    Procedures (including critical care time)  Labs Review Labs Reviewed - No data to display  Imaging Review No results found.   Visual Acuity Review  Right Eye Distance:   Left Eye Distance:   Bilateral Distance:    Right Eye Near:   Left Eye Near:    Bilateral Near:        Amlodipine as restarted for this patient. Prescription sent to the [facility] outpatient pharmacy for 90 day supply it is strongly recommended the patient follow-up with primary care provider. Patient wanted medication to bring her pressure down immediately I have advised against this as that and be harmful. Patient states that she understands. MDM   1. Elevated blood pressure     Patient is reassured that there are no issues that require transfer to higher level of care at this time or additional tests. Patient is advised to continue home symptomatic treatment. Patient is advised that if there are new or worsening symptoms to attend the emergency department, contact primary care provider, or return to UC. Instructions of care provided discharged home in stable condition.    THIS NOTE WAS GENERATED USING A VOICE RECOGNITION SOFTWARE PROGRAM. ALL REASONABLE EFFORTS  WERE MADE TO PROOFREAD THIS DOCUMENT FOR ACCURACY.  I have verbally reviewed the discharge instructions with the patient. A printed AVS was given to the patient.  All  questions were answered prior to discharge.      Tharon Aquas, PA 02/14/16 Silva Bandy

## 2016-02-14 NOTE — Discharge Instructions (Signed)
Take your medication as directed.  You will need to find a local provider  You have a 90 day supply of medicine, which should give you time to find a provider.

## 2016-02-14 NOTE — ED Triage Notes (Signed)
Pt here for hypertension, headache, ringing in her ears. sts that she hasn't had her BP meds in 1 month.

## 2016-04-03 ENCOUNTER — Emergency Department (HOSPITAL_COMMUNITY)
Admission: EM | Admit: 2016-04-03 | Discharge: 2016-04-04 | Disposition: A | Discharge status: Home / Self Care | Payer: Medicare Other | Attending: Emergency Medicine | Admitting: Emergency Medicine

## 2016-04-03 ENCOUNTER — Emergency Department (HOSPITAL_COMMUNITY): Payer: Medicare Other

## 2016-04-03 ENCOUNTER — Encounter (HOSPITAL_COMMUNITY): Payer: Self-pay | Admitting: *Deleted

## 2016-04-03 DIAGNOSIS — I129 Hypertensive chronic kidney disease with stage 1 through stage 4 chronic kidney disease, or unspecified chronic kidney disease: Secondary | ICD-10-CM | POA: Diagnosis not present

## 2016-04-03 DIAGNOSIS — J449 Chronic obstructive pulmonary disease, unspecified: Secondary | ICD-10-CM | POA: Diagnosis not present

## 2016-04-03 DIAGNOSIS — N183 Chronic kidney disease, stage 3 (moderate): Secondary | ICD-10-CM | POA: Diagnosis not present

## 2016-04-03 DIAGNOSIS — M542 Cervicalgia: Secondary | ICD-10-CM | POA: Diagnosis present

## 2016-04-03 DIAGNOSIS — R079 Chest pain, unspecified: Secondary | ICD-10-CM | POA: Diagnosis not present

## 2016-04-03 DIAGNOSIS — M50321 Other cervical disc degeneration at C4-C5 level: Secondary | ICD-10-CM | POA: Diagnosis not present

## 2016-04-03 DIAGNOSIS — I10 Essential (primary) hypertension: Secondary | ICD-10-CM | POA: Diagnosis not present

## 2016-04-03 DIAGNOSIS — M5412 Radiculopathy, cervical region: Secondary | ICD-10-CM | POA: Diagnosis not present

## 2016-04-03 DIAGNOSIS — Z87891 Personal history of nicotine dependence: Secondary | ICD-10-CM | POA: Diagnosis not present

## 2016-04-03 DIAGNOSIS — Z7982 Long term (current) use of aspirin: Secondary | ICD-10-CM | POA: Diagnosis not present

## 2016-04-03 LAB — BASIC METABOLIC PANEL
ANION GAP: 7 (ref 5–15)
BUN: 15 mg/dL (ref 6–20)
CALCIUM: 9.6 mg/dL (ref 8.9–10.3)
CO2: 30 mmol/L (ref 22–32)
Chloride: 100 mmol/L — ABNORMAL LOW (ref 101–111)
Creatinine, Ser: 1.12 mg/dL — ABNORMAL HIGH (ref 0.44–1.00)
GFR, EST AFRICAN AMERICAN: 52 mL/min — AB (ref >60)
GFR, EST NON AFRICAN AMERICAN: 45 mL/min — AB (ref >60)
GLUCOSE: 98 mg/dL (ref 65–99)
POTASSIUM: 4.3 mmol/L (ref 3.5–5.1)
Sodium: 137 mmol/L (ref 135–145)

## 2016-04-03 LAB — I-STAT TROPONIN, ED: TROPONIN I, POC: 0.01 ng/mL (ref 0.00–0.08)

## 2016-04-03 LAB — CBC
HEMATOCRIT: 38.5 % (ref 36.0–46.0)
HEMOGLOBIN: 13 g/dL (ref 12.0–15.0)
MCH: 31.1 pg (ref 26.0–34.0)
MCHC: 33.8 g/dL (ref 30.0–36.0)
MCV: 92.1 fL (ref 78.0–100.0)
Platelets: 186 10*3/uL (ref 150–400)
RBC: 4.18 MIL/uL (ref 3.87–5.11)
RDW: 13.3 % (ref 11.5–15.5)
WBC: 5.6 10*3/uL (ref 4.0–10.5)

## 2016-04-03 LAB — TROPONIN I: Troponin I: <0.03 ng/mL (ref <0.03)

## 2016-04-03 MED ORDER — DEXAMETHASONE SODIUM PHOSPHATE 10 MG/ML IJ SOLN
10.0000 mg | Freq: Once | INTRAMUSCULAR | Status: AC
  Administered 2016-04-03: 10 mg via INTRAMUSCULAR
  Filled 2016-04-03: qty 1

## 2016-04-03 MED ORDER — PREDNISONE 10 MG (21) PO TBPK: 10.0000 mg | ORAL_TABLET | Freq: Every day | ORAL | 0 refills | Status: DC

## 2016-04-03 MED ORDER — HYDROCODONE-ACETAMINOPHEN 5-325 MG PO TABS
1.0000 | ORAL_TABLET | Freq: Once | ORAL | Status: AC
  Administered 2016-04-03: 1 via ORAL
  Filled 2016-04-03: qty 1

## 2016-04-03 MED ORDER — AMLODIPINE BESYLATE 10 MG PO TABS: 10.0000 mg | ORAL_TABLET | Freq: Every day | ORAL | 0 refills | Status: DC

## 2016-04-03 MED ORDER — HYDROCODONE-ACETAMINOPHEN 5-325 MG PO TABS: 2.0000 | ORAL_TABLET | ORAL | 0 refills | Status: DC | PRN

## 2016-04-03 NOTE — ED Provider Notes (Signed)
[facility]-EMERGENCY DEPT Provider Note   CSN: [id number] Arrival date & time: 04/03/16  1815     History   Chief Complaint Chief Complaint  Patient presents with  . Arm Pain    HPI Amber Rowland is a 82 y.o. female.  Pt presents to the ED today with pain in neck going down left arm and into left chest.  She wanted to know what it was, so she came to the ED.  She also has a hx of HTN and does not think her bp meds are working.  Pt is on amlodipine and does not have a pcp.  It looks like she was last given a 3 month supply back in August at the [facility] urgent care.  The pt       Past Medical History:  Diagnosis Date  . Atrophy of left kidney    Chronic atrophy of the left kidney.  . Benign breast cysts in female 04/12/2012   Seen on mammography 2009   . Chronic constipation   . COPD (chronic obstructive pulmonary disease) (HCC)    Hyperinflation and heavy smoking history strongly  . Ejection fraction   . Esophageal dysmotility 02/14/2015  . Glaucoma (increased eye pressure)   . HTN (hypertension) 04/12/2012  . Ileus following gastrointestinal surgery 04/28/2012  . Malnutrition (HCC) 04/28/2012  . Ovarian cyst, right 2009   s/p BSO 2009: OVARIAN FIBROTHECOMA in multiple fluid filled cysts  4.5cm region  . SBO (small bowel obstruction)   . Severe malnutrition (HCC) 09/16/2013  . Stage III chronic kidney disease 02/13/2015  . UTI (lower urinary tract infection)    History of chronic recurrent UTIs    Patient Active Problem List   Diagnosis Date Noted  . Pressure ulcer 02/14/2015  . Esophageal dysmotility 02/14/2015  . Atherosclerosis of aorta With small ruptured plaque 02/14/2015  . Abdominal distention   . Bloating   . Dysphagia   . Pulmonary nodule, right   . Left renal artery stenosis (HCC) 02/13/2015  . Dysphasia 02/13/2015  . Acute kidney injury (HCC) 02/13/2015  . Stage III chronic kidney disease 02/13/2015  . Pancreatic mass 02/13/2015  . Protein-calorie  malnutrition, severe (HCC) 09/18/2013  . Murmur 09/17/2013  . Chest pain 09/16/2013  . Glaucoma 09/16/2013  . S/P laparotomy 06/10/2012  . Tobacco abuse 04/12/2012  . HTN (hypertension) 04/12/2012  . Benign breast cysts in female 04/12/2012  . Anxiety 04/12/2012  . Lung nodules, ?incidental 04/12/2012  . Chronic constipation   . COPD (chronic obstructive pulmonary disease) (HCC)     Past Surgical History:  Procedure Laterality Date  . BILATERAL OOPHORECTOMY  2009   PROCEDURE:  diagnostic laparoscopy, laparotomy with bilateral salpingo-  . LAPAROTOMY  04/23/2012   Procedure: EXPLORATORY LAPAROTOMY;  Surgeon: Valarie Merino, MD;  Location: [facility] ORS;  Service: General;  Laterality: N/A;  enterolysis     OB History    No data available       Home Medications    Prior to Admission medications   Medication Sig Start Date End Date Taking? Authorizing Provider  aspirin 325 MG tablet Take 325 mg by mouth every morning.    Yes Historical Provider, MD  Travoprost, BAK Free, (TRAVATAN) 0.004 % SOLN ophthalmic solution Place 1 drop into both eyes at bedtime.    Yes Historical Provider, MD  vitamin B-12 1000 MCG tablet Take 1 tablet (1,000 mcg total) by mouth daily. 09/18/13  Yes Renae Fickle, MD  acetaminophen (TYLENOL) 500 MG tablet Take  1 tablet (500 mg total) by mouth every 6 (six) hours as needed. Patient not taking: Reported on 04/03/2016 03/07/15   Mady GemmaElizabeth C Westfall, PA-C  amLODipine (NORVASC) 10 MG tablet Take 1 tablet (10 mg total) by mouth daily. 04/03/16   Jacalyn LefevreJulie Joyanne Eddinger, MD  feeding supplement, ENSURE ENLIVE, (ENSURE ENLIVE) LIQD Take 237 mLs by mouth 2 (two) times daily between meals. Patient not taking: Reported on 04/03/2016 02/14/15   Maryruth Bunhristina P Rama, MD  omeprazole (PRILOSEC) 20 MG capsule Take 1 capsule (20 mg total) by mouth daily. Patient not taking: Reported on 04/03/2016 02/14/15   Maryruth Bunhristina P Rama, MD  ondansetron (ZOFRAN) 4 MG tablet Take 1 tablet (4 mg total)  by mouth every 6 (six) hours as needed for nausea. Patient not taking: Reported on 04/03/2016 02/14/15   Maryruth Bunhristina P Rama, MD  polyethylene glycol (MIRALAX / GLYCOLAX) packet Take 17 g by mouth daily. Patient not taking: Reported on 04/03/2016 02/14/15   Maryruth Bunhristina P Rama, MD    Family History Family History  Problem Relation Age of Onset  . Diabetes Son   . High blood pressure Son   . Coronary artery disease Son     Social History Social History  Substance Use Topics  . Smoking status: Former Smoker    Packs/day: 0.80    Years: 60.00    Types: Cigarettes    Quit date: 03/20/2014  . Smokeless tobacco: Never Used  . Alcohol use No     Allergies   Review of patient's allergies indicates no known allergies.   Review of Systems Review of Systems  Musculoskeletal: Positive for neck pain.       Left arm pain  All other systems reviewed and are negative.    Physical Exam Updated Vital Signs BP 167/86   Pulse (!) 53   Temp 97.5 F (36.4 C) (Oral)   Resp 16   SpO2 100%   Physical Exam  Constitutional: She is oriented to person, place, and time. She appears well-developed and well-nourished.  HENT:  Head: Normocephalic and atraumatic.  Right Ear: External ear normal.  Left Ear: External ear normal.  Nose: Nose normal.  Mouth/Throat: Oropharynx is clear and moist.  Eyes: Conjunctivae are normal. Pupils are equal, round, and reactive to light.  Neck: Normal range of motion. Neck supple.  Cardiovascular: Normal rate, regular rhythm, normal heart sounds and intact distal pulses.   Pulmonary/Chest: Effort normal and breath sounds normal.  Abdominal: Soft. Bowel sounds are normal.  Musculoskeletal: Normal range of motion.  Neurological: She is alert and oriented to person, place, and time.  Psychiatric: She has a normal mood and affect. Her behavior is normal. Judgment and thought content normal.  Nursing note and vitals reviewed.    ED Treatments / Results   Labs (all labs ordered are listed, but only abnormal results are displayed) Labs Reviewed  BASIC METABOLIC PANEL - Abnormal; Notable for the following:       Result Value   Chloride 100 (*)    Creatinine, Ser 1.12 (*)    GFR calc non Af Amer 45 (*)    GFR calc Af Amer 52 (*)    All other components within normal limits  CBC  TROPONIN I  Rosezena SensorI-STAT TROPOININ, ED    EKG  EKG Interpretation  Date/Time:  Friday April 03 2016 18:38:29 EDT Ventricular Rate:  58 PR Interval:    QRS Duration: 101 QT Interval:  449 QTC Calculation: 441 R Axis:   -38 Text Interpretation:  Sinus rhythm Left ventricular hypertrophy Anterior Q waves, possibly due to LVH Confirmed by [facility] MD, Makaylah Oddo ([id number]) on 04/03/2016 10:27:01 PM       Radiology Dg Chest 2 View  Result Date: 04/03/2016 CLINICAL DATA:  Initial evaluation for acute left chest pain with left arm pain. EXAM: CHEST  2 VIEW COMPARISON:  Prior radiograph from 03/07/2015. FINDINGS: Moderate cardiomegaly, stable. Mediastinal silhouette within normal limits. Lungs hyperinflated with changes compatible with COPD, stable. Right perihilar scarring. No pulmonary edema, or pleural effusion, or focal infiltrate. No pneumothorax. No acute osseous abnormality. Severe scoliosis. Pectus deformity noted. IMPRESSION: 1. No active cardiopulmonary disease. 2. COPD. 3. Stable cardiomegaly without pulmonary edema. Electronically Signed   By: Rise Mu M.D.   On: 04/03/2016 19:42   Dg Cervical Spine Complete  Result Date: 04/03/2016 CLINICAL DATA:  82 year old with left arm radiculopathy. EXAM: CERVICAL SPINE - COMPLETE 4+ VIEW COMPARISON:  Radiographs 04/19/2014 FINDINGS: Stable alignment. 3 mm anterolisthesis of C3 on C4, unchanged to diminished from prior exam, previously 4 mm. 3 mm anterolisthesis of C4 on C5 is unchanged. Advanced degenerative disc disease most prominent at C5-C6 and C6-C7 with endplate spurring. Prominent facet arthropathy  throughout, similar. There is bilateral neural foraminal narrowing at C3-C4. No radiographic evidence of acute fracture. Lateral masses of C1 are well aligned on C2. No prevertebral soft tissue edema. IMPRESSION: Stable radiographic appearance of the cervical spine. Multilevel degenerative change with degenerative disc disease and facet arthropathy. Stable anterolisthesis of C3 on C4 and C4 on C5. Electronically Signed   By: Rubye Oaks M.D.   On: 04/03/2016 22:53    Procedures Procedures (including critical care time)  Medications Ordered in ED Medications  dexamethasone (DECADRON) injection 10 mg (10 mg Intramuscular Given 04/03/16 2249)  HYDROcodone-acetaminophen (NORCO/VICODIN) 5-325 MG per tablet 1 tablet (1 tablet Oral Given 04/03/16 2249)     Initial Impression / Assessment and Plan / ED Course  I have reviewed the triage vital signs and the nursing notes.  Pertinent labs & imaging results that were available during my care of the patient were reviewed by me and considered in my medical decision making (see chart for details).  Clinical Course    Pt has multilevel degenerative changes in her neck, and I suspect her pain is a radiculopathy type pain.  2 sets of negative troponins and atypical story.  Pt is ok for d/c.  Final Clinical Impressions(s) / ED Diagnoses   Final diagnoses:  Cervical radiculopathy  Essential hypertension    New Prescriptions Current Discharge Medication List       Jacalyn Lefevre, MD 04/03/16 2344

## 2016-04-03 NOTE — ED Notes (Signed)
Patient given graham crackers and water.  

## 2016-04-03 NOTE — ED Triage Notes (Signed)
Pt complains of pain in her left arm and chest since 3AM today. Pt states the pain radiats from her shoulder to her wrist. Pt denies shortness of breath.

## 2016-11-05 DIAGNOSIS — H26492 Other secondary cataract, left eye: Secondary | ICD-10-CM | POA: Diagnosis not present

## 2016-11-05 DIAGNOSIS — H401134 Primary open-angle glaucoma, bilateral, indeterminate stage: Secondary | ICD-10-CM | POA: Diagnosis not present

## 2016-11-05 DIAGNOSIS — H25811 Combined forms of age-related cataract, right eye: Secondary | ICD-10-CM | POA: Diagnosis not present

## 2016-11-05 DIAGNOSIS — H43821 Vitreomacular adhesion, right eye: Secondary | ICD-10-CM | POA: Diagnosis not present

## 2016-12-03 DIAGNOSIS — H401114 Primary open-angle glaucoma, right eye, indeterminate stage: Secondary | ICD-10-CM | POA: Diagnosis not present

## 2016-12-03 DIAGNOSIS — H25811 Combined forms of age-related cataract, right eye: Secondary | ICD-10-CM | POA: Diagnosis not present

## 2016-12-03 DIAGNOSIS — H401134 Primary open-angle glaucoma, bilateral, indeterminate stage: Secondary | ICD-10-CM | POA: Diagnosis not present

## 2017-01-07 DIAGNOSIS — H401132 Primary open-angle glaucoma, bilateral, moderate stage: Secondary | ICD-10-CM | POA: Diagnosis not present

## 2017-01-29 DIAGNOSIS — H401112 Primary open-angle glaucoma, right eye, moderate stage: Secondary | ICD-10-CM | POA: Diagnosis not present

## 2017-01-29 DIAGNOSIS — H401123 Primary open-angle glaucoma, left eye, severe stage: Secondary | ICD-10-CM | POA: Diagnosis not present

## 2017-03-25 DIAGNOSIS — H04123 Dry eye syndrome of bilateral lacrimal glands: Secondary | ICD-10-CM | POA: Diagnosis not present

## 2017-06-25 DIAGNOSIS — H401134 Primary open-angle glaucoma, bilateral, indeterminate stage: Secondary | ICD-10-CM | POA: Diagnosis not present

## 2017-07-16 ENCOUNTER — Other Ambulatory Visit: Payer: Self-pay

## 2017-07-16 ENCOUNTER — Encounter (HOSPITAL_COMMUNITY): Payer: Self-pay | Admitting: Emergency Medicine

## 2017-07-16 DIAGNOSIS — Z87891 Personal history of nicotine dependence: Secondary | ICD-10-CM | POA: Diagnosis not present

## 2017-07-16 DIAGNOSIS — R600 Localized edema: Secondary | ICD-10-CM | POA: Insufficient documentation

## 2017-07-16 DIAGNOSIS — J449 Chronic obstructive pulmonary disease, unspecified: Secondary | ICD-10-CM | POA: Insufficient documentation

## 2017-07-16 DIAGNOSIS — R350 Frequency of micturition: Secondary | ICD-10-CM | POA: Diagnosis not present

## 2017-07-16 DIAGNOSIS — I129 Hypertensive chronic kidney disease with stage 1 through stage 4 chronic kidney disease, or unspecified chronic kidney disease: Secondary | ICD-10-CM | POA: Diagnosis not present

## 2017-07-16 DIAGNOSIS — N183 Chronic kidney disease, stage 3 (moderate): Secondary | ICD-10-CM | POA: Diagnosis not present

## 2017-07-16 DIAGNOSIS — G8929 Other chronic pain: Secondary | ICD-10-CM | POA: Insufficient documentation

## 2017-07-16 DIAGNOSIS — M25559 Pain in unspecified hip: Secondary | ICD-10-CM | POA: Insufficient documentation

## 2017-07-16 DIAGNOSIS — Z7982 Long term (current) use of aspirin: Secondary | ICD-10-CM | POA: Insufficient documentation

## 2017-07-16 DIAGNOSIS — Z79899 Other long term (current) drug therapy: Secondary | ICD-10-CM | POA: Insufficient documentation

## 2017-07-16 DIAGNOSIS — I10 Essential (primary) hypertension: Secondary | ICD-10-CM | POA: Diagnosis not present

## 2017-07-16 DIAGNOSIS — J439 Emphysema, unspecified: Secondary | ICD-10-CM | POA: Diagnosis not present

## 2017-07-16 LAB — I-STAT CHEM 8, ED
BUN: 17 mg/dL (ref 6–20)
CREATININE: 0.8 mg/dL (ref 0.44–1.00)
Calcium, Ion: 1.22 mmol/L (ref 1.15–1.40)
Chloride: 100 mmol/L — ABNORMAL LOW (ref 101–111)
Glucose, Bld: 96 mg/dL (ref 65–99)
HEMATOCRIT: 36 % (ref 36.0–46.0)
HEMOGLOBIN: 12.2 g/dL (ref 12.0–15.0)
POTASSIUM: 4.6 mmol/L (ref 3.5–5.1)
Sodium: 138 mmol/L (ref 135–145)
TCO2: 29 mmol/L (ref 22–32)

## 2017-07-16 NOTE — ED Triage Notes (Signed)
Patient complaining of hip pain, both legs are swollen and red, and patient is having urinary frequency. Patient states her urine has a fishy smell.

## 2017-07-17 ENCOUNTER — Emergency Department (HOSPITAL_COMMUNITY): Payer: Medicare Other

## 2017-07-17 ENCOUNTER — Emergency Department (HOSPITAL_COMMUNITY)
Admission: EM | Admit: 2017-07-17 | Discharge: 2017-07-17 | Disposition: A | Discharge status: Home / Self Care | Payer: Medicare Other | Attending: Emergency Medicine | Admitting: Emergency Medicine

## 2017-07-17 ENCOUNTER — Encounter (HOSPITAL_COMMUNITY): Payer: Self-pay | Admitting: Emergency Medicine

## 2017-07-17 DIAGNOSIS — R600 Localized edema: Secondary | ICD-10-CM

## 2017-07-17 DIAGNOSIS — J439 Emphysema, unspecified: Secondary | ICD-10-CM | POA: Diagnosis not present

## 2017-07-17 DIAGNOSIS — I10 Essential (primary) hypertension: Secondary | ICD-10-CM

## 2017-07-17 LAB — CBC WITH DIFFERENTIAL/PLATELET
BASOS PCT: 0 %
Basophils Absolute: 0 10*3/uL (ref 0.0–0.1)
Eosinophils Absolute: 0.1 10*3/uL (ref 0.0–0.7)
Eosinophils Relative: 1 %
HEMATOCRIT: 36.7 % (ref 36.0–46.0)
HEMOGLOBIN: 11.6 g/dL — AB (ref 12.0–15.0)
LYMPHS PCT: 18 %
Lymphs Abs: 1.1 10*3/uL (ref 0.7–4.0)
MCH: 29.8 pg (ref 26.0–34.0)
MCHC: 31.6 g/dL (ref 30.0–36.0)
MCV: 94.3 fL (ref 78.0–100.0)
MONOS PCT: 9 %
Monocytes Absolute: 0.5 10*3/uL (ref 0.1–1.0)
NEUTROS ABS: 4.4 10*3/uL (ref 1.7–7.7)
Neutrophils Relative %: 72 %
Platelets: 177 10*3/uL (ref 150–400)
RBC: 3.89 MIL/uL (ref 3.87–5.11)
RDW: 13.9 % (ref 11.5–15.5)
WBC: 6.1 10*3/uL (ref 4.0–10.5)

## 2017-07-17 LAB — URINALYSIS, ROUTINE W REFLEX MICROSCOPIC
Bacteria, UA: NONE SEEN
Bilirubin Urine: NEGATIVE
Glucose, UA: NEGATIVE mg/dL
Hgb urine dipstick: NEGATIVE
KETONES UR: NEGATIVE mg/dL
Nitrite: NEGATIVE
PH: 7 (ref 5.0–8.0)
PROTEIN: NEGATIVE mg/dL
RBC / HPF: NONE SEEN RBC/hpf (ref 0–5)
Specific Gravity, Urine: 1.003 — ABNORMAL LOW (ref 1.005–1.030)

## 2017-07-17 LAB — I-STAT TROPONIN, ED: Troponin i, poc: 0 ng/mL (ref 0.00–0.08)

## 2017-07-17 MED ORDER — FUROSEMIDE 40 MG PO TABS
40.0000 mg | ORAL_TABLET | Freq: Once | ORAL | Status: AC
  Administered 2017-07-17: 40 mg via ORAL
  Filled 2017-07-17: qty 1

## 2017-07-17 MED ORDER — HYDROCHLOROTHIAZIDE 25 MG PO TABS: 25.0000 mg | ORAL_TABLET | Freq: Every day | ORAL | 0 refills | Status: DC

## 2017-07-17 MED ORDER — AMLODIPINE BESYLATE 5 MG PO TABS: 5.0000 mg | ORAL_TABLET | Freq: Every day | ORAL | 0 refills | Status: DC

## 2017-07-17 MED ORDER — AMLODIPINE BESYLATE 5 MG PO TABS
5.0000 mg | ORAL_TABLET | Freq: Once | ORAL | Status: AC
  Administered 2017-07-17: 5 mg via ORAL
  Filled 2017-07-17: qty 1

## 2017-07-17 NOTE — ED Provider Notes (Signed)
[facility]-EMERGENCY DEPT Provider Note   CSN: [id number] Arrival date & time: 07/16/17  2054     History   Chief Complaint Chief Complaint  Patient presents with  . Leg Swelling  . Urinary Frequency  . Hip Pain    HPI Amber Rowland is a 84 y.o. female.  The history is provided by the patient.  Illness  This is a chronic problem. The current episode started more than 1 week ago (more than a month). The problem occurs constantly. The problem has been gradually worsening. Pertinent negatives include no chest pain, no abdominal pain, no headaches and no shortness of breath. Nothing aggravates the symptoms. Nothing relieves the symptoms. She has tried nothing for the symptoms. The treatment provided no relief.  Peripheral edema, takes in a lot of salt daily.  She does not have a PMD and tells me she has chronic hypertension.    Past Medical History:  Diagnosis Date  . Atrophy of left kidney    Chronic atrophy of the left kidney.  . Benign breast cysts in female 04/12/2012   Seen on mammography 2009   . Chronic constipation   . COPD (chronic obstructive pulmonary disease) (HCC)    Hyperinflation and heavy smoking history strongly  . Ejection fraction   . Esophageal dysmotility 02/14/2015  . Glaucoma (increased eye pressure)   . HTN (hypertension) 04/12/2012  . Ileus following gastrointestinal surgery 04/28/2012  . Malnutrition (HCC) 04/28/2012  . Ovarian cyst, right 2009   s/p BSO 2009: OVARIAN FIBROTHECOMA in multiple fluid filled cysts  4.5cm region  . SBO (small bowel obstruction) (HCC)   . Severe malnutrition (HCC) 09/16/2013  . Stage III chronic kidney disease (HCC) 02/13/2015  . UTI (lower urinary tract infection)    History of chronic recurrent UTIs    Patient Active Problem List   Diagnosis Date Noted  . Pressure ulcer 02/14/2015  . Esophageal dysmotility 02/14/2015  . Atherosclerosis of aorta With small ruptured plaque 02/14/2015  .  Abdominal distention   . Bloating   . Dysphagia   . Pulmonary nodule, right   . Left renal artery stenosis (HCC) 02/13/2015  . Dysphasia 02/13/2015  . Acute kidney injury (HCC) 02/13/2015  . Stage III chronic kidney disease (HCC) 02/13/2015  . Pancreatic mass 02/13/2015  . Protein-calorie malnutrition, severe (HCC) 09/18/2013  . Murmur 09/17/2013  . Chest pain 09/16/2013  . Glaucoma 09/16/2013  . S/P laparotomy 06/10/2012  . Tobacco abuse 04/12/2012  . HTN (hypertension) 04/12/2012  . Benign breast cysts in female 04/12/2012  . Anxiety 04/12/2012  . Lung nodules, ?incidental 04/12/2012  . Chronic constipation   . COPD (chronic obstructive pulmonary disease) (HCC)     Past Surgical History:  Procedure Laterality Date  . BILATERAL OOPHORECTOMY  2009   PROCEDURE:  diagnostic laparoscopy, laparotomy with bilateral salpingo-  . LAPAROTOMY  04/23/2012   Procedure: EXPLORATORY LAPAROTOMY;  Surgeon: Valarie Merino, MD;  Location: [facility] ORS;  Service: General;  Laterality: N/A;  enterolysis     OB History    No data available       Home Medications    Prior to Admission medications   Medication Sig Start Date End Date Taking? Authorizing Provider  amLODipine (NORVASC) 10 MG tablet Take 1 tablet (10 mg total) by mouth daily. 04/03/16  Yes Jacalyn Lefevre, MD  aspirin 325 MG tablet Take 325 mg by mouth every morning.    Yes [provider]  Travoprost, BAK Free, (TRAVATAN) 0.004 %  SOLN ophthalmic solution Place 1 drop into both eyes at bedtime.    Yes [provider]  acetaminophen (TYLENOL) 500 MG tablet Take 1 tablet (500 mg total) by mouth every 6 (six) hours as needed. Patient not taking: Reported on 04/03/2016 03/07/15   Mady Gemma, PA-C  feeding supplement, ENSURE ENLIVE, (ENSURE ENLIVE) LIQD Take 237 mLs by mouth 2 (two) times daily between meals. Patient not taking: Reported on 04/03/2016 02/14/15   Rama, Maryruth Bun, MD  omeprazole (PRILOSEC) 20  MG capsule Take 1 capsule (20 mg total) by mouth daily. Patient not taking: Reported on 04/03/2016 02/14/15   Rama, Maryruth Bun, MD  ondansetron (ZOFRAN) 4 MG tablet Take 1 tablet (4 mg total) by mouth every 6 (six) hours as needed for nausea. Patient not taking: Reported on 04/03/2016 02/14/15   Rama, Maryruth Bun, MD  polyethylene glycol (MIRALAX / GLYCOLAX) packet Take 17 g by mouth daily. Patient not taking: Reported on 04/03/2016 02/14/15   Rama, Maryruth Bun, MD  vitamin B-12 1000 MCG tablet Take 1 tablet (1,000 mcg total) by mouth daily. Patient not taking: Reported on 07/17/2017 09/18/13   Renae Fickle, MD    Family History Family History  Problem Relation Age of Onset  . Diabetes Son   . High blood pressure Son   . Coronary artery disease Son     Social History Social History   Tobacco Use  . Smoking status: Former Smoker    Packs/day: 0.80    Years: 60.00    Pack years: 48.00    Types: Cigarettes    Last attempt to quit: 03/20/2014    Years since quitting: 3.3  . Smokeless tobacco: Never Used  Substance Use Topics  . Alcohol use: No  . Drug use: No     Allergies   Patient has no known allergies.   Review of Systems Review of Systems  Constitutional: Negative for diaphoresis and fever.  Respiratory: Negative for shortness of breath.   Cardiovascular: Negative for chest pain and palpitations.  Gastrointestinal: Negative for abdominal pain.  Musculoskeletal: Negative for arthralgias.  Neurological: Negative for headaches.  All other systems reviewed and are negative.    Physical Exam Updated Vital Signs BP (!) 186/92   Pulse 62   Temp 97.6 F (36.4 C) (Oral)   Resp 18   Ht 5' (1.524 m)   Wt 40.8 kg (90 lb)   SpO2 98%   BMI 17.58 kg/m   Physical Exam  Constitutional: She is oriented to person, place, and time. She appears well-developed and well-nourished. No distress.  HENT:  Head: Normocephalic and atraumatic.  Nose: Nose normal.    Mouth/Throat: No oropharyngeal exudate.  Eyes: Conjunctivae are normal. Pupils are equal, round, and reactive to light.  Neck: Normal range of motion. Neck supple. No JVD present.  Cardiovascular: Normal rate, regular rhythm, normal heart sounds and intact distal pulses.  Pulmonary/Chest: Effort normal and breath sounds normal. No stridor. She has no wheezes. She has no rales.  Abdominal: Soft. Bowel sounds are normal. She exhibits no mass. There is no tenderness. There is no rebound and no guarding.  Musculoskeletal: Normal range of motion. She exhibits edema. She exhibits no tenderness.  No mid shins B.  Negative Homan's sign  Neurological: She is alert and oriented to person, place, and time. She displays normal reflexes.  Skin: Skin is warm and dry. Capillary refill takes less than 2 seconds.  Psychiatric: She has a normal mood and affect.  ED Treatments / Results   Vitals:   07/17/17 0430 07/17/17 0530  BP: (!) 168/84 (!) 186/92  Pulse: 60 62  Resp:  18  Temp:    SpO2: 100% 98%    Labs (all labs ordered are listed, but only  Results for orders placed or performed during the hospital encounter of 07/17/17  Urinalysis, Routine w reflex microscopic- may I&O cath if menses  Result Value Ref Range   Color, Urine STRAW (A) YELLOW   APPearance CLEAR CLEAR   Specific Gravity, Urine 1.003 (L) 1.005 - 1.030   pH 7.0 5.0 - 8.0   Glucose, UA NEGATIVE NEGATIVE mg/dL   Hgb urine dipstick NEGATIVE NEGATIVE   Bilirubin Urine NEGATIVE NEGATIVE   Ketones, ur NEGATIVE NEGATIVE mg/dL   Protein, ur NEGATIVE NEGATIVE mg/dL   Nitrite NEGATIVE NEGATIVE   Leukocytes, UA TRACE (A) NEGATIVE   RBC / HPF NONE SEEN 0 - 5 RBC/hpf   WBC, UA 0-5 0 - 5 WBC/hpf   Bacteria, UA NONE SEEN NONE SEEN   Squamous Epithelial / LPF 0-5 (A) NONE SEEN  CBC with Differential/Platelet  Result Value Ref Range   WBC 6.1 4.0 - 10.5 K/uL   RBC 3.89 3.87 - 5.11 MIL/uL   Hemoglobin 11.6 (L) 12.0 - 15.0 g/dL    HCT 81.1 91.4 - 78.2 %   MCV 94.3 78.0 - 100.0 fL   MCH 29.8 26.0 - 34.0 pg   MCHC 31.6 30.0 - 36.0 g/dL   RDW 95.6 21.3 - 08.6 %   Platelets 177 150 - 400 K/uL   Neutrophils Relative % 72 %   Neutro Abs 4.4 1.7 - 7.7 K/uL   Lymphocytes Relative 18 %   Lymphs Abs 1.1 0.7 - 4.0 K/uL   Monocytes Relative 9 %   Monocytes Absolute 0.5 0.1 - 1.0 K/uL   Eosinophils Relative 1 %   Eosinophils Absolute 0.1 0.0 - 0.7 K/uL   Basophils Relative 0 %   Basophils Absolute 0.0 0.0 - 0.1 K/uL  I-Stat Chem 8, ED  Result Value Ref Range   Sodium 138 135 - 145 mmol/L   Potassium 4.6 3.5 - 5.1 mmol/L   Chloride 100 (L) 101 - 111 mmol/L   BUN 17 6 - 20 mg/dL   Creatinine, Ser 5.78 0.44 - 1.00 mg/dL   Glucose, Bld 96 65 - 99 mg/dL   Calcium, Ion 4.69 6.29 - 1.40 mmol/L   TCO2 29 22 - 32 mmol/L   Hemoglobin 12.2 12.0 - 15.0 g/dL   HCT 52.8 41.3 - 24.4 %  I-stat troponin, ED  Result Value Ref Range   Troponin i, poc 0.00 0.00 - 0.08 ng/mL   Comment 3           Dg Chest 2 View  Result Date: 07/17/2017 CLINICAL DATA:  Bilateral leg swelling and redness. History of COPD. EXAM: CHEST  2 VIEW COMPARISON:  04/03/2016 FINDINGS: Cardiac enlargement without vascular congestion. Diffuse emphysematous changes in the lungs. Peribronchial thickening and interstitial changes consistent with chronic bronchitis. Scarring in the right upper lung similar to prior study. No airspace disease or consolidation. Calcified and tortuous aorta. Thoracolumbar scoliosis and pectus excavatum deformity. IMPRESSION: Emphysematous changes and chronic bronchitic changes in the lungs. No evidence of active infiltration. Cardiac enlargement. Aortic atherosclerosis. Electronically Signed   By: Burman Nieves M.D.   On: 07/17/2017 05:00     EKG  EKG Interpretation  Date/Time:  Saturday July 17 2017 05:16:08 EST Ventricular Rate:  58 PR Interval:    QRS Duration: 112 QT Interval:  479 QTC Calculation: 475 R Axis:   48 Text  Interpretation:  Sinus rhythm LVH with IVCD and secondary repol abnrm Baseline wander in lead(s) V1 V6 Confirmed by Nicanor AlconPalumbo, Ronte Parker ([id number]) on 07/17/2017 5:41:42 AM       Radiology Dg Chest 2 View  Result Date: 07/17/2017 CLINICAL DATA:  Bilateral leg swelling and redness. History of COPD. EXAM: CHEST  2 VIEW COMPARISON:  04/03/2016 FINDINGS: Cardiac enlargement without vascular congestion. Diffuse emphysematous changes in the lungs. Peribronchial thickening and interstitial changes consistent with chronic bronchitis. Scarring in the right upper lung similar to prior study. No airspace disease or consolidation. Calcified and tortuous aorta. Thoracolumbar scoliosis and pectus excavatum deformity. IMPRESSION: Emphysematous changes and chronic bronchitic changes in the lungs. No evidence of active infiltration. Cardiac enlargement. Aortic atherosclerosis. Electronically Signed   By: Burman NievesWilliam  Stevens M.D.   On: 07/17/2017 05:00    Procedures Procedures (including critical care time)  Medications Ordered in ED Medications  amLODipine (NORVASC) tablet 5 mg (not administered)  furosemide (LASIX) tablet 40 mg (40 mg Oral Given 07/17/17 0517)      Final Clinical Impressions(s) / ED Diagnoses   Follow up with your PMD.  Will start BP medication and HCTZ for edema.    Return for weakness, numbness, changes in vision or speech,  fevers > 100.4 unrelieved by medication, shortness of breath, intractable vomiting, or diarrhea, abdominal pain, Inability to tolerate liquids or food, cough, altered mental status or any concerns. No signs of systemic illness or infection. The patient is nontoxic-appearing on exam and vital signs are within normal limits.    I have reviewed the triage vital signs and the nursing notes. Pertinent labs &imaging results that were available during my care of the patient were reviewed by me and considered in my medical decision making (see chart for details).  After history,  exam, and medical workup I feel the patient has been appropriately medically screened and is safe for discharge home. Pertinent diagnoses were discussed with the patient. Patient was given return precautions.      Nikcole Eischeid, MD 07/17/17 [id number]

## 2017-07-17 NOTE — ED Notes (Signed)
Patient transported to X-ray 

## 2017-08-13 ENCOUNTER — Emergency Department (HOSPITAL_COMMUNITY): Payer: Medicare Other

## 2017-08-13 ENCOUNTER — Emergency Department (HOSPITAL_COMMUNITY)
Admission: EM | Admit: 2017-08-13 | Discharge: 2017-08-14 | Disposition: A | Discharge status: Home / Self Care | Payer: Medicare Other | Attending: Emergency Medicine | Admitting: Emergency Medicine

## 2017-08-13 ENCOUNTER — Encounter (HOSPITAL_COMMUNITY): Payer: Self-pay

## 2017-08-13 ENCOUNTER — Ambulatory Visit (HOSPITAL_COMMUNITY)
Admission: EM | Admit: 2017-08-13 | Discharge: 2017-08-13 | Disposition: A | Discharge status: Home / Self Care | Payer: Medicare Other | Source: Home / Self Care

## 2017-08-13 ENCOUNTER — Other Ambulatory Visit: Payer: Self-pay

## 2017-08-13 DIAGNOSIS — Z79899 Other long term (current) drug therapy: Secondary | ICD-10-CM | POA: Insufficient documentation

## 2017-08-13 DIAGNOSIS — I129 Hypertensive chronic kidney disease with stage 1 through stage 4 chronic kidney disease, or unspecified chronic kidney disease: Secondary | ICD-10-CM | POA: Diagnosis not present

## 2017-08-13 DIAGNOSIS — Z7982 Long term (current) use of aspirin: Secondary | ICD-10-CM | POA: Insufficient documentation

## 2017-08-13 DIAGNOSIS — R202 Paresthesia of skin: Secondary | ICD-10-CM | POA: Diagnosis not present

## 2017-08-13 DIAGNOSIS — R200 Anesthesia of skin: Secondary | ICD-10-CM | POA: Diagnosis not present

## 2017-08-13 DIAGNOSIS — R002 Palpitations: Secondary | ICD-10-CM | POA: Diagnosis not present

## 2017-08-13 DIAGNOSIS — M79641 Pain in right hand: Secondary | ICD-10-CM | POA: Diagnosis not present

## 2017-08-13 DIAGNOSIS — N183 Chronic kidney disease, stage 3 (moderate): Secondary | ICD-10-CM | POA: Insufficient documentation

## 2017-08-13 DIAGNOSIS — M79601 Pain in right arm: Secondary | ICD-10-CM

## 2017-08-13 DIAGNOSIS — M79621 Pain in right upper arm: Secondary | ICD-10-CM | POA: Diagnosis not present

## 2017-08-13 DIAGNOSIS — R0789 Other chest pain: Secondary | ICD-10-CM | POA: Diagnosis not present

## 2017-08-13 DIAGNOSIS — F17210 Nicotine dependence, cigarettes, uncomplicated: Secondary | ICD-10-CM | POA: Diagnosis not present

## 2017-08-13 DIAGNOSIS — J449 Chronic obstructive pulmonary disease, unspecified: Secondary | ICD-10-CM | POA: Diagnosis not present

## 2017-08-13 LAB — BASIC METABOLIC PANEL
ANION GAP: 10 (ref 5–15)
BUN: 20 mg/dL (ref 6–20)
CALCIUM: 8.9 mg/dL (ref 8.9–10.3)
CHLORIDE: 102 mmol/L (ref 101–111)
CO2: 26 mmol/L (ref 22–32)
Creatinine, Ser: 1.01 mg/dL — ABNORMAL HIGH (ref 0.44–1.00)
GFR calc Af Amer: 58 mL/min — ABNORMAL LOW (ref >60)
GFR calc non Af Amer: 50 mL/min — ABNORMAL LOW (ref >60)
GLUCOSE: 87 mg/dL (ref 65–99)
Potassium: 4.2 mmol/L (ref 3.5–5.1)
Sodium: 138 mmol/L (ref 135–145)

## 2017-08-13 LAB — CBC
HEMATOCRIT: 36.4 % (ref 36.0–46.0)
Hemoglobin: 11.3 g/dL — ABNORMAL LOW (ref 12.0–15.0)
MCH: 29.8 pg (ref 26.0–34.0)
MCHC: 31 g/dL (ref 30.0–36.0)
MCV: 96 fL (ref 78.0–100.0)
PLATELETS: 209 10*3/uL (ref 150–400)
RBC: 3.79 MIL/uL — ABNORMAL LOW (ref 3.87–5.11)
RDW: 13.9 % (ref 11.5–15.5)
WBC: 6.1 10*3/uL (ref 4.0–10.5)

## 2017-08-13 LAB — I-STAT TROPONIN, ED: Troponin i, poc: 0 ng/mL (ref 0.00–0.08)

## 2017-08-13 NOTE — ED Notes (Signed)
Spoke with patient and her daughter, pt c/o heart racing and chest tightness, wants to be sure shes not having a heart attack, pt in NAD, resp e/u, speaking in full sentences. Pt explained capabilities of urgent care and daughter decided to take patient straight to ER. Pt ambulated out of the building without issue.

## 2017-08-13 NOTE — ED Triage Notes (Signed)
Pt states she has had chest tightness and right arm numbness for several days. She states she also feels as though her heart is racing. Pt states she has hx of arthritis and thinks it could be that. No distress noted.

## 2017-08-14 ENCOUNTER — Emergency Department (HOSPITAL_COMMUNITY): Payer: Medicare Other

## 2017-08-14 DIAGNOSIS — R200 Anesthesia of skin: Secondary | ICD-10-CM | POA: Diagnosis not present

## 2017-08-14 DIAGNOSIS — R202 Paresthesia of skin: Secondary | ICD-10-CM | POA: Diagnosis not present

## 2017-08-14 LAB — DIFFERENTIAL
Basophils Absolute: 0 10*3/uL (ref 0.0–0.1)
Basophils Relative: 1 %
EOS PCT: 2 %
Eosinophils Absolute: 0.1 10*3/uL (ref 0.0–0.7)
LYMPHS ABS: 1.3 10*3/uL (ref 0.7–4.0)
LYMPHS PCT: 24 %
Monocytes Absolute: 0.5 10*3/uL (ref 0.1–1.0)
Monocytes Relative: 9 %
Neutro Abs: 3.3 10*3/uL (ref 1.7–7.7)
Neutrophils Relative %: 64 %

## 2017-08-14 LAB — COMPREHENSIVE METABOLIC PANEL
ALK PHOS: 162 U/L — AB (ref 38–126)
ALT: 12 U/L — ABNORMAL LOW (ref 14–54)
ANION GAP: 12 (ref 5–15)
AST: 28 U/L (ref 15–41)
Albumin: 3.6 g/dL (ref 3.5–5.0)
BILIRUBIN TOTAL: 1.1 mg/dL (ref 0.3–1.2)
BUN: 19 mg/dL (ref 6–20)
CALCIUM: 9.4 mg/dL (ref 8.9–10.3)
CO2: 24 mmol/L (ref 22–32)
Chloride: 103 mmol/L (ref 101–111)
Creatinine, Ser: 0.96 mg/dL (ref 0.44–1.00)
GFR, EST NON AFRICAN AMERICAN: 54 mL/min — AB (ref >60)
GLUCOSE: 90 mg/dL (ref 65–99)
Potassium: 4.8 mmol/L (ref 3.5–5.1)
Sodium: 139 mmol/L (ref 135–145)
TOTAL PROTEIN: 6.6 g/dL (ref 6.5–8.1)

## 2017-08-14 LAB — I-STAT CHEM 8, ED
BUN: 24 mg/dL — ABNORMAL HIGH (ref 6–20)
CREATININE: 0.9 mg/dL (ref 0.44–1.00)
Calcium, Ion: 1.15 mmol/L (ref 1.15–1.40)
Chloride: 101 mmol/L (ref 101–111)
Glucose, Bld: 88 mg/dL (ref 65–99)
HEMATOCRIT: 39 % (ref 36.0–46.0)
HEMOGLOBIN: 13.3 g/dL (ref 12.0–15.0)
Potassium: 4.6 mmol/L (ref 3.5–5.1)
Sodium: 139 mmol/L (ref 135–145)
TCO2: 31 mmol/L (ref 22–32)

## 2017-08-14 LAB — PROTIME-INR
INR: 1.03
PROTHROMBIN TIME: 13.4 s (ref 11.4–15.2)

## 2017-08-14 LAB — APTT: aPTT: 32 seconds (ref 24–36)

## 2017-08-14 NOTE — Discharge Instructions (Signed)
Today for numbness in the right hand and heart palpitations.  Your workup is largely reassuring.  Follow-up with your primary physician and cardiology.  Your MRI does not show any evidence of stroke.

## 2017-08-14 NOTE — ED Notes (Signed)
Mare Ferrariatricia Ryan, Pt daughter [phone]

## 2017-08-14 NOTE — ED Notes (Signed)
Patient transported to CT 

## 2017-08-14 NOTE — ED Notes (Signed)
Patient transported to MRI 

## 2017-08-14 NOTE — ED Provider Notes (Signed)
[facility] EMERGENCY DEPARTMENT Provider Note   CSN: [id number] Arrival date & time: 08/13/17  1501     History   Chief Complaint Chief Complaint  Patient presents with  . Numbness    HPI Amber Rowland is a 84 y.o. female.  HPI  This is an 84 year old female with a history of COPD, hypertension, chronic kidney disease, history of smoking who presents with right hand numbness.  Patient reports 1 week history of right hand to the mid forearm.  She also reports difficulty writing.  She is right-handed.  Patient reports that she has had pain in her left hand which she attributes to arthritis but the numbness is new in her right hand.  She denies any weakness elsewhere, speech difficulty, difficulty ambulating.  No history of stroke.  She takes an aspirin daily.  Patient reports that she has had several episodes of palpitations and chest discomfort.  Last episode of this was yesterday.  This does not appear to be in the setting of exertion.  Patient denies any recent cough, fever, recent illnesses.  Past Medical History:  Diagnosis Date  . Atrophy of left kidney    Chronic atrophy of the left kidney.  . Benign breast cysts in female 04/12/2012   Seen on mammography 2009   . Chronic constipation   . COPD (chronic obstructive pulmonary disease) (HCC)    Hyperinflation and heavy smoking history strongly  . Ejection fraction   . Esophageal dysmotility 02/14/2015  . Glaucoma (increased eye pressure)   . HTN (hypertension) 04/12/2012  . Ileus following gastrointestinal surgery 04/28/2012  . Malnutrition (HCC) 04/28/2012  . Ovarian cyst, right 2009   s/p BSO 2009: OVARIAN FIBROTHECOMA in multiple fluid filled cysts  4.5cm region  . SBO (small bowel obstruction) (HCC)   . Severe malnutrition (HCC) 09/16/2013  . Stage III chronic kidney disease (HCC) 02/13/2015  . UTI (lower urinary tract infection)    History of chronic recurrent UTIs    Patient Active Problem  List   Diagnosis Date Noted  . Pressure ulcer 02/14/2015  . Esophageal dysmotility 02/14/2015  . Atherosclerosis of aorta With small ruptured plaque 02/14/2015  . Abdominal distention   . Bloating   . Dysphagia   . Pulmonary nodule, right   . Left renal artery stenosis (HCC) 02/13/2015  . Dysphasia 02/13/2015  . Acute kidney injury (HCC) 02/13/2015  . Stage III chronic kidney disease (HCC) 02/13/2015  . Pancreatic mass 02/13/2015  . Protein-calorie malnutrition, severe (HCC) 09/18/2013  . Murmur 09/17/2013  . Chest pain 09/16/2013  . Glaucoma 09/16/2013  . S/P laparotomy 06/10/2012  . Tobacco abuse 04/12/2012  . HTN (hypertension) 04/12/2012  . Benign breast cysts in female 04/12/2012  . Anxiety 04/12/2012  . Lung nodules, ?incidental 04/12/2012  . Chronic constipation   . COPD (chronic obstructive pulmonary disease) (HCC)     Past Surgical History:  Procedure Laterality Date  . BILATERAL OOPHORECTOMY  2009   PROCEDURE:  diagnostic laparoscopy, laparotomy with bilateral salpingo-  . LAPAROTOMY  04/23/2012   Procedure: EXPLORATORY LAPAROTOMY;  Surgeon: Valarie Merino, MD;  Location: [facility] ORS;  Service: General;  Laterality: N/A;  enterolysis     OB History    No data available       Home Medications    Prior to Admission medications   Medication Sig Start Date End Date Taking? Authorizing Provider  amLODipine (NORVASC) 5 MG tablet Take 1 tablet (5 mg total) by mouth daily. 07/17/17  Yes Palumbo, April, MD  aspirin 325 MG tablet Take 325 mg by mouth every morning.    Yes [provider]  acetaminophen (TYLENOL) 500 MG tablet Take 1 tablet (500 mg total) by mouth every 6 (six) hours as needed. Patient not taking: Reported on 04/03/2016 03/07/15   Mady Gemma, PA-C  amLODipine (NORVASC) 10 MG tablet Take 1 tablet (10 mg total) by mouth daily. Patient not taking: Reported on 08/14/2017 04/03/16   Jacalyn Lefevre, MD  feeding supplement, ENSURE ENLIVE,  (ENSURE ENLIVE) LIQD Take 237 mLs by mouth 2 (two) times daily between meals. Patient not taking: Reported on 04/03/2016 02/14/15   Rama, Maryruth Bun, MD  hydrochlorothiazide (HYDRODIURIL) 25 MG tablet Take 1 tablet (25 mg total) by mouth daily. Patient not taking: Reported on 08/14/2017 07/17/17   Palumbo, April, MD  omeprazole (PRILOSEC) 20 MG capsule Take 1 capsule (20 mg total) by mouth daily. Patient not taking: Reported on 04/03/2016 02/14/15   Rama, Maryruth Bun, MD  ondansetron (ZOFRAN) 4 MG tablet Take 1 tablet (4 mg total) by mouth every 6 (six) hours as needed for nausea. Patient not taking: Reported on 04/03/2016 02/14/15   Rama, Maryruth Bun, MD  polyethylene glycol (MIRALAX / GLYCOLAX) packet Take 17 g by mouth daily. Patient not taking: Reported on 04/03/2016 02/14/15   Rama, Maryruth Bun, MD  vitamin B-12 1000 MCG tablet Take 1 tablet (1,000 mcg total) by mouth daily. Patient not taking: Reported on 07/17/2017 09/18/13   Renae Fickle, MD    Family History Family History  Problem Relation Age of Onset  . Diabetes Son   . High blood pressure Son   . Coronary artery disease Son     Social History Social History   Tobacco Use  . Smoking status: Former Smoker    Packs/day: 0.80    Years: 60.00    Pack years: 48.00    Types: Cigarettes    Last attempt to quit: 03/20/2014    Years since quitting: 3.4  . Smokeless tobacco: Never Used  Substance Use Topics  . Alcohol use: No  . Drug use: No     Allergies   Patient has no known allergies.   Review of Systems Review of Systems  Constitutional: Negative for fever.  Respiratory: Negative for shortness of breath.   Cardiovascular: Positive for chest pain and leg swelling.  Gastrointestinal: Negative for abdominal pain.  Genitourinary: Negative for dysuria.  Skin: Negative for color change.  Neurological: Positive for numbness. Negative for facial asymmetry, speech difficulty and weakness.  All other systems reviewed and  are negative.    Physical Exam Updated Vital Signs BP 126/82   Pulse 68   Temp 98 F (36.7 C)   Resp 17   Ht 5' (1.524 m)   Wt 38.6 kg (85 lb)   SpO2 100%   BMI 16.60 kg/m   Physical Exam  Constitutional: She is oriented to person, place, and time.  Elderly, frail, thin  HENT:  Head: Normocephalic and atraumatic.  Eyes: Pupils are equal, round, and reactive to light.  Cardiovascular: Normal rate, regular rhythm and normal heart sounds.  No murmur heard. Pulmonary/Chest: Effort normal and breath sounds normal. No respiratory distress. She has no wheezes.  Abdominal: Soft. There is no tenderness.  Musculoskeletal:  Chronic deformities noted mostly at the DIP joints of the bilateral hands  Neurological: She is alert and oriented to person, place, and time.  Cranial nerves II through XII intact, 5 out of 5  strength in all 4 extremities, no dysmetria to finger-nose-finger, no drift  Skin: Skin is warm and dry.  Psychiatric: She has a normal mood and affect.  Nursing note and vitals reviewed.    ED Treatments / Results  Labs (all labs ordered are listed, but only abnormal results are displayed) Labs Reviewed  BASIC METABOLIC PANEL - Abnormal; Notable for the following components:      Result Value   Creatinine, Ser 1.01 (*)    GFR calc non Af Amer 50 (*)    GFR calc Af Amer 58 (*)    All other components within normal limits  CBC - Abnormal; Notable for the following components:   RBC 3.79 (*)    Hemoglobin 11.3 (*)    All other components within normal limits  COMPREHENSIVE METABOLIC PANEL - Abnormal; Notable for the following components:   ALT 12 (*)    Alkaline Phosphatase 162 (*)    GFR calc non Af Amer 54 (*)    All other components within normal limits  I-STAT CHEM 8, ED - Abnormal; Notable for the following components:   BUN 24 (*)    All other components within normal limits  PROTIME-INR  APTT  DIFFERENTIAL  ETHANOL  RAPID URINE DRUG SCREEN, HOSP  PERFORMED  URINALYSIS, ROUTINE W REFLEX MICROSCOPIC  TROPONIN I  I-STAT TROPONIN, ED    EKG  EKG Interpretation None       EKG Interpretation  Date/Time:  Friday August 13 2017 15:53:33 EST Ventricular Rate:  74 PR Interval:  164 QRS Duration: 88 QT Interval:  400 QTC Calculation: 444 R Axis:   17 Text Interpretation:  Normal sinus rhythm RSR' or QR pattern in V1 suggests right ventricular conduction delay Possible Inferior infarct , age undetermined Anterior infarct , age undetermined Abnormal ECG No significant change since last tracing Confirmed by Ross Marcus ([id number]) on 08/14/2017 4:19:56 AM        Radiology Dg Chest 2 View  Result Date: 08/13/2017 CLINICAL DATA:  Chest tightness and right arm numbness for several days. EXAM: CHEST  2 VIEW COMPARISON:  07/17/2017 FINDINGS: Stable cardiomegaly with tortuous atherosclerotic aorta. Emphysematous hyperinflation of the lungs with peribronchial thickening compatible with chronic bronchitic change. No pneumonic consolidation. Scarring is seen at the apices. Stable dextroconvex curvature at the thoracolumbar junction. IMPRESSION: Emphysematous lungs with chronic bronchitic change and apical pleuroparenchymal scarring. Stable cardiomegaly with aortic atherosclerosis. No acute findings noted. Electronically Signed   By: Tollie Eth M.D.   On: 08/13/2017 16:35   Ct Head Wo Contrast  Result Date: 08/14/2017 CLINICAL DATA:  Numbness in the right arm EXAM: CT HEAD WITHOUT CONTRAST TECHNIQUE: Contiguous axial images were obtained from the base of the skull through the vertex without intravenous contrast. COMPARISON:  CT brain 11/16/2009 FINDINGS: Brain: No acute territorial infarction, hemorrhage or intracranial mass is visualized. Mild to moderate atrophy, progressed since prior. Increased hypodensity within the bilateral white matter, consistent with progression of small vessel ischemic changes. The ventricles are nonenlarged.  Vascular: No hyperdense vessels.  Carotid artery calcification Skull: Normal. Negative for fracture or focal lesion. Sinuses/Orbits: Mild mucosal thickening in the maxillary, sphenoid and ethmoid sinuses. No acute orbital abnormality Other: None. IMPRESSION: 1. No CT evidence for acute intracranial abnormality. 2. Atrophy and small vessel ischemic changes of the white matter, progressed since prior CT brain. Electronically Signed   By: Jasmine Pang M.D.   On: 08/14/2017 01:35   Mr Brain Wo Contrast  Result Date: 08/14/2017 CLINICAL  DATA:  Chest tightness, RIGHT arm numbness for several days. Tachycardia. History of hypertension, pancreatic mass, malnutrition. EXAM: MRI HEAD WITHOUT CONTRAST TECHNIQUE: Multiplanar, multiecho pulse sequences of the brain and surrounding structures were obtained without intravenous contrast. COMPARISON:  CT HEAD August 14, 2017 FINDINGS: Head INTRACRANIAL CONTENTS: No reduced diffusion to suggest acute ischemia. RIGHT periventricular chronic microhemorrhages. Confluent supratentorial white matter FLAIR T2 hyperintensities. No parenchymal brain volume loss for age. No hydrocephalus. No midline shift, mass effect or masses. Faint nonspecific T2 hyperintensities bilateral globus palladi. No abnormal extra-axial fluid collections. VASCULAR: Normal major intracranial vascular flow voids present at skull base. Diminutive basilar artery with bilateral posterior communicating arteries documented. SKULL AND UPPER CERVICAL SPINE: No abnormal sellar expansion. No suspicious calvarial bone marrow signal. Craniocervical junction maintained. Severe C5-6 and C6-7 spondylosis. SINUSES/ORBITS: The mastoid air-cells and included paranasal sinuses are well-aerated.The included ocular globes and orbital contents are non-suspicious. Status post bilateral ocular lens implants. OTHER: Patient is edentulous. IMPRESSION: 1. No acute intracranial process. 2. Moderate to severe chronic small vessel  ischemic disease. Electronically Signed   By: Awilda Metroourtnay  Bloomer M.D.   On: 08/14/2017 03:26    Procedures Procedures (including critical care time)  Medications Ordered in ED Medications - No data to display   Initial Impression / Assessment and Plan / ED Course  I have reviewed the triage vital signs and the nursing notes.  Pertinent labs & imaging results that were available during my care of the patient were reviewed by me and considered in my medical decision making (see chart for details).     She presents with 1 week history of numbness and paresthesia of the right hand as well as upper extremity pain.  She also mentions palpitations.  She is nontoxic appearing on exam.  Vital signs are reassuring.  Neurologic exam is intact.  This could be a subacute stroke.  EKG shows no evidence of arrhythmia.  Troponin is negative.  From a chest pain perspective, she is without pain and only reports palpitations occasionally.  Last palpitations yesterday.  Metabolic panels are reassuring.  Discussed with neurology, Dr. Amada JupiterKirkpatrick.  Recommends MRI.  If negative, patient can be discharged home without formal stroke workup given duration of symptoms.  MRI is negative.  Patient reassured.  Cardiology follow-up provided as an outpatient.  After history, exam, and medical workup I feel the patient has been appropriately medically screened and is safe for discharge home. Pertinent diagnoses were discussed with the patient. Patient was given return precautions.   Final Clinical Impressions(s) / ED Diagnoses   Final diagnoses:  Paresthesia  Pain of right upper extremity  Palpitations    ED Discharge Orders    None       Horton, Mayer Maskerourtney F, MD 08/14/17 [phone]

## 2017-08-20 DIAGNOSIS — H43393 Other vitreous opacities, bilateral: Secondary | ICD-10-CM | POA: Diagnosis not present

## 2017-08-26 DIAGNOSIS — H401112 Primary open-angle glaucoma, right eye, moderate stage: Secondary | ICD-10-CM | POA: Diagnosis not present

## 2017-08-26 DIAGNOSIS — H401123 Primary open-angle glaucoma, left eye, severe stage: Secondary | ICD-10-CM | POA: Diagnosis not present

## 2017-09-09 ENCOUNTER — Ambulatory Visit: Payer: Self-pay | Admitting: Podiatry

## 2017-09-16 ENCOUNTER — Encounter: Payer: Self-pay | Admitting: Podiatry

## 2017-09-16 ENCOUNTER — Ambulatory Visit (INDEPENDENT_AMBULATORY_CARE_PROVIDER_SITE_OTHER): Payer: Medicare Other | Admitting: Podiatry

## 2017-09-16 DIAGNOSIS — M79676 Pain in unspecified toe(s): Secondary | ICD-10-CM

## 2017-09-16 DIAGNOSIS — B351 Tinea unguium: Secondary | ICD-10-CM

## 2017-09-18 NOTE — Progress Notes (Signed)
Subjective:  Patient ID: Amber Rowland, female    DOB: [birth year],  MRN: [id number] HPI Chief Complaint  Patient presents with  . Nail Problem    Requesting nail care-toenails long and thick, also concerned with swelling and redness in legs and skin is scaly  . New Patient (Initial Visit)    84 y.o. female presents with the above complaint.   ROS: All other systems were reviewed and denied.  Past Medical History:  Diagnosis Date  . Atrophy of left kidney    Chronic atrophy of the left kidney.  . Benign breast cysts in female 04/12/2012   Seen on mammography 2009   . Chronic constipation   . COPD (chronic obstructive pulmonary disease) (HCC)    Hyperinflation and heavy smoking history strongly  . Ejection fraction   . Esophageal dysmotility 02/14/2015  . Glaucoma (increased eye pressure)   . HTN (hypertension) 04/12/2012  . Ileus following gastrointestinal surgery 04/28/2012  . Malnutrition (HCC) 04/28/2012  . Ovarian cyst, right 2009   s/p BSO 2009: OVARIAN FIBROTHECOMA in multiple fluid filled cysts  4.5cm region  . SBO (small bowel obstruction) (HCC)   . Severe malnutrition (HCC) 09/16/2013  . Stage III chronic kidney disease (HCC) 02/13/2015  . UTI (lower urinary tract infection)    History of chronic recurrent UTIs   Past Surgical History:  Procedure Laterality Date  . BILATERAL OOPHORECTOMY  2009   PROCEDURE:  diagnostic laparoscopy, laparotomy with bilateral salpingo-  . LAPAROTOMY  04/23/2012   Procedure: EXPLORATORY LAPAROTOMY;  Surgeon: Valarie Merino, MD;  Location: [facility];  Service: General;  Laterality: N/A;  enterolysis     Current Outpatient Medications:  .  acetaminophen (TYLENOL) 500 MG tablet, Take 1 tablet (500 mg total) by mouth every 6 (six) hours as needed. (Patient not taking: Reported on 04/03/2016), Disp: 30 tablet, Rfl: 0 .  amLODipine (NORVASC) 5 MG tablet, Take 1 tablet (5 mg total) by mouth daily., Disp: 30 tablet, Rfl: 0 .  aspirin 325 MG  tablet, Take 325 mg by mouth every morning. , Disp: , Rfl:  .  brimonidine (ALPHAGAN) 0.15 % ophthalmic solution, USE 1 DROP INTO LEFT EYE TWICE A DAY AS DIRECTED, Disp: , Rfl: 1 .  feeding supplement, ENSURE ENLIVE, (ENSURE ENLIVE) LIQD, Take 237 mLs by mouth 2 (two) times daily between meals. (Patient not taking: Reported on 04/03/2016), Disp: 237 mL, Rfl: 12 .  hydrochlorothiazide (HYDRODIURIL) 25 MG tablet, Take 1 tablet (25 mg total) by mouth daily. (Patient not taking: Reported on 08/14/2017), Disp: 15 tablet, Rfl: 0 .  omeprazole (PRILOSEC) 20 MG capsule, Take 1 capsule (20 mg total) by mouth daily. (Patient not taking: Reported on 04/03/2016), Disp: 30 capsule, Rfl: 3 .  ondansetron (ZOFRAN) 4 MG tablet, Take 1 tablet (4 mg total) by mouth every 6 (six) hours as needed for nausea. (Patient not taking: Reported on 04/03/2016), Disp: 20 tablet, Rfl: 0 .  polyethylene glycol (MIRALAX / GLYCOLAX) packet, Take 17 g by mouth daily. (Patient not taking: Reported on 04/03/2016), Disp: 30 each, Rfl: 2 .  vitamin B-12 1000 MCG tablet, Take 1 tablet (1,000 mcg total) by mouth daily. (Patient not taking: Reported on 07/17/2017), Disp: 30 tablet, Rfl: 0  No Known Allergies Review of Systems Objective:  There were no vitals filed for this visit.  General: Well developed, nourished, in no acute distress, alert and oriented x3   Dermatological: Skin is warm, dry and supple bilateral.  Remaining integument appears unremarkable at  this time. There are no open sores, no preulcerative lesions, no rash or signs of infection present.  Toenails are thick yellow dystrophic clinically mycotic painful on palpation as well as debridement  Vascular: Dorsalis Pedis artery and Posterior Tibial artery pedal pulses are 2/4 bilateral with immedate capillary fill time. Pedal hair growth present. No varicosities and no lower extremity edema present bilateral.   Neruologic: Grossly intact via light touch bilateral. Vibratory  intact via tuning fork bilateral. Protective threshold with Semmes Wienstein monofilament intact to all pedal sites bilateral. Patellar and Achilles deep tendon reflexes 2+ bilateral. No Babinski or clonus noted bilateral.   Musculoskeletal: No gross boney pedal deformities bilateral. No pain, crepitus, or limitation noted with foot and ankle range of motion bilateral. Muscular strength 5/5 in all groups tested bilateral.  Hammertoe deformities are noted bilateral.  Rigid  Gait: Unassisted, Nonantalgic.    Radiographs:  None taken  Assessment & Plan:   Assessment: Pain in limb secondary to onychomycosis hammertoe deformities bilateral.  Plan: Discussed etiology pathology conservative versus surgical therapies.  Debrided toenails 1 through 5 bilateral.     Max T. Othello, [place]

## 2017-12-17 ENCOUNTER — Ambulatory Visit: Payer: Medicare Other | Admitting: Podiatry

## 2017-12-23 ENCOUNTER — Emergency Department (HOSPITAL_COMMUNITY)
Admission: EM | Admit: 2017-12-23 | Discharge: 2017-12-24 | Disposition: A | Discharge status: Home / Self Care | Payer: Medicare Other | Attending: Emergency Medicine | Admitting: Emergency Medicine

## 2017-12-23 ENCOUNTER — Emergency Department (HOSPITAL_COMMUNITY): Payer: Medicare Other

## 2017-12-23 ENCOUNTER — Encounter (HOSPITAL_COMMUNITY): Payer: Self-pay | Admitting: Emergency Medicine

## 2017-12-23 DIAGNOSIS — K5900 Constipation, unspecified: Secondary | ICD-10-CM | POA: Insufficient documentation

## 2017-12-23 DIAGNOSIS — R079 Chest pain, unspecified: Secondary | ICD-10-CM | POA: Diagnosis not present

## 2017-12-23 DIAGNOSIS — Z87891 Personal history of nicotine dependence: Secondary | ICD-10-CM | POA: Diagnosis not present

## 2017-12-23 DIAGNOSIS — R1084 Generalized abdominal pain: Secondary | ICD-10-CM | POA: Diagnosis not present

## 2017-12-23 DIAGNOSIS — I129 Hypertensive chronic kidney disease with stage 1 through stage 4 chronic kidney disease, or unspecified chronic kidney disease: Secondary | ICD-10-CM | POA: Diagnosis not present

## 2017-12-23 DIAGNOSIS — N183 Chronic kidney disease, stage 3 (moderate): Secondary | ICD-10-CM | POA: Diagnosis not present

## 2017-12-23 DIAGNOSIS — Z79899 Other long term (current) drug therapy: Secondary | ICD-10-CM | POA: Insufficient documentation

## 2017-12-23 DIAGNOSIS — J449 Chronic obstructive pulmonary disease, unspecified: Secondary | ICD-10-CM | POA: Diagnosis not present

## 2017-12-23 DIAGNOSIS — Z7982 Long term (current) use of aspirin: Secondary | ICD-10-CM | POA: Insufficient documentation

## 2017-12-23 DIAGNOSIS — R109 Unspecified abdominal pain: Secondary | ICD-10-CM | POA: Diagnosis not present

## 2017-12-23 LAB — BASIC METABOLIC PANEL
ANION GAP: 8 (ref 5–15)
BUN: 17 mg/dL (ref 8–23)
CALCIUM: 9.6 mg/dL (ref 8.9–10.3)
CO2: 31 mmol/L (ref 22–32)
Chloride: 102 mmol/L (ref 98–111)
Creatinine, Ser: 1.06 mg/dL — ABNORMAL HIGH (ref 0.44–1.00)
GFR calc non Af Amer: 48 mL/min — ABNORMAL LOW (ref >60)
GFR, EST AFRICAN AMERICAN: 55 mL/min — AB (ref >60)
Glucose, Bld: 94 mg/dL (ref 70–99)
POTASSIUM: 4.5 mmol/L (ref 3.5–5.1)
Sodium: 141 mmol/L (ref 135–145)

## 2017-12-23 LAB — I-STAT CG4 LACTIC ACID, ED
LACTIC ACID, VENOUS: 0.81 mmol/L (ref 0.5–1.9)
Lactic Acid, Venous: 0.85 mmol/L (ref 0.5–1.9)

## 2017-12-23 LAB — HEPATIC FUNCTION PANEL
ALBUMIN: 3.7 g/dL (ref 3.5–5.0)
ALT: 13 U/L (ref 0–44)
AST: 22 U/L (ref 15–41)
Alkaline Phosphatase: 120 U/L (ref 38–126)
BILIRUBIN DIRECT: 0.1 mg/dL (ref 0.0–0.2)
Indirect Bilirubin: 0.5 mg/dL (ref 0.3–0.9)
Total Bilirubin: 0.6 mg/dL (ref 0.3–1.2)
Total Protein: 6.7 g/dL (ref 6.5–8.1)

## 2017-12-23 LAB — URINALYSIS, ROUTINE W REFLEX MICROSCOPIC
BILIRUBIN URINE: NEGATIVE
Bacteria, UA: NONE SEEN
GLUCOSE, UA: NEGATIVE mg/dL
HGB URINE DIPSTICK: NEGATIVE
KETONES UR: NEGATIVE mg/dL
Nitrite: NEGATIVE
PH: 7 (ref 5.0–8.0)
PROTEIN: NEGATIVE mg/dL
Specific Gravity, Urine: 1.018 (ref 1.005–1.030)

## 2017-12-23 LAB — CBC
HEMATOCRIT: 40.4 % (ref 36.0–46.0)
HEMOGLOBIN: 12.8 g/dL (ref 12.0–15.0)
MCH: 30.8 pg (ref 26.0–34.0)
MCHC: 31.7 g/dL (ref 30.0–36.0)
MCV: 97.1 fL (ref 78.0–100.0)
Platelets: 225 10*3/uL (ref 150–400)
RBC: 4.16 MIL/uL (ref 3.87–5.11)
RDW: 13.3 % (ref 11.5–15.5)
WBC: 5.2 10*3/uL (ref 4.0–10.5)

## 2017-12-23 LAB — I-STAT TROPONIN, ED: Troponin i, poc: 0 ng/mL (ref 0.00–0.08)

## 2017-12-23 LAB — LIPASE, BLOOD: Lipase: 37 U/L (ref 11–51)

## 2017-12-23 MED ORDER — AMLODIPINE BESYLATE 5 MG PO TABS: 5.0000 mg | ORAL_TABLET | Freq: Every day | ORAL | 0 refills | Status: DC

## 2017-12-23 MED ORDER — IOPAMIDOL (ISOVUE-300) INJECTION 61%
75.0000 mL | Freq: Once | INTRAVENOUS | Status: AC | PRN
  Administered 2017-12-23: 75 mL via INTRAVENOUS

## 2017-12-23 MED ORDER — ONDANSETRON HCL 4 MG/2ML IJ SOLN
INTRAMUSCULAR | Status: AC
  Filled 2017-12-23: qty 2

## 2017-12-23 MED ORDER — FENTANYL CITRATE (PF) 100 MCG/2ML IJ SOLN
50.0000 ug | Freq: Once | INTRAMUSCULAR | Status: AC
  Administered 2017-12-23: 50 ug via INTRAVENOUS

## 2017-12-23 MED ORDER — FENTANYL CITRATE (PF) 100 MCG/2ML IJ SOLN
INTRAMUSCULAR | Status: AC
  Filled 2017-12-23: qty 2

## 2017-12-23 MED ORDER — ONDANSETRON HCL 4 MG/2ML IJ SOLN
4.0000 mg | Freq: Once | INTRAMUSCULAR | Status: AC
  Administered 2017-12-23: 4 mg via INTRAVENOUS

## 2017-12-23 MED ORDER — IOHEXOL 300 MG/ML  SOLN
25.0000 mL | Freq: Once | INTRAMUSCULAR | Status: AC | PRN
  Administered 2017-12-23: 25 mL via ORAL

## 2017-12-23 MED ORDER — POLYETHYLENE GLYCOL 3350 17 G PO PACK
17.0000 g | PACK | Freq: Every day | ORAL | 0 refills | Status: DC
Start: 2017-12-23 — End: 2018-05-22

## 2017-12-23 NOTE — ED Triage Notes (Addendum)
Patient c/o body aches with nausea x1 week. Also reports generalized chest pain with SOB x1 week. Hx COPD. Denies V/D. Noted abdomen distension in triage. Hx SBO.

## 2017-12-23 NOTE — Discharge Instructions (Signed)
Your work-up today showed evidence of constipation however no evidence of obstruction was seen.  Her other work-up was overall reassuring.  Please stay hydrated and use the MiraLAX to help with your bowel movements.  Please use the blood pressure medicine which we refilled at your request.  Please follow-up with your primary doctor in several days.  If any symptoms change or worsen, please return to the nearest emergency department.

## 2017-12-23 NOTE — ED Provider Notes (Signed)
[facility]-EMERGENCY DEPT Provider Note   CSN: [id number] Arrival date & time: 12/23/17  1825     History   Chief Complaint Chief Complaint  Patient presents with  . Generalized Body Aches  . Shortness of Breath  . Chest Pain    HPI Amber Rowland is a 84 y.o. female.  The history is provided by the patient, a relative and medical records. No language interpreter was used.  Abdominal Pain   This is a recurrent problem. The current episode started more than 2 days ago. The problem occurs constantly. The problem has been gradually worsening. The pain is associated with eating. The pain is located in the generalized abdominal region. The quality of the pain is aching, cramping and sharp. The pain is at a severity of 6/10. The pain is moderate. Associated symptoms include anorexia, nausea and constipation. Pertinent negatives include fever, diarrhea, flatus (decrased), vomiting, dysuria, frequency and headaches. The symptoms are aggravated by palpation and eating. Nothing relieves the symptoms.    Past Medical History:  Diagnosis Date  . Atrophy of left kidney    Chronic atrophy of the left kidney.  . Benign breast cysts in female 04/12/2012   Seen on mammography 2009   . Chronic constipation   . COPD (chronic obstructive pulmonary disease) (HCC)    Hyperinflation and heavy smoking history strongly  . Ejection fraction   . Esophageal dysmotility 02/14/2015  . Glaucoma (increased eye pressure)   . HTN (hypertension) 04/12/2012  . Ileus following gastrointestinal surgery (HCC) 04/28/2012  . Malnutrition (HCC) 04/28/2012  . Ovarian cyst, right 2009   s/p BSO 2009: OVARIAN FIBROTHECOMA in multiple fluid filled cysts  4.5cm region  . SBO (small bowel obstruction) (HCC)   . Severe malnutrition (HCC) 09/16/2013  . Stage III chronic kidney disease (HCC) 02/13/2015  . UTI (lower urinary tract infection)    History of chronic recurrent UTIs    Patient Active  Problem List   Diagnosis Date Noted  . Pressure ulcer 02/14/2015  . Esophageal dysmotility 02/14/2015  . Atherosclerosis of aorta With small ruptured plaque 02/14/2015  . Abdominal distention   . Bloating   . Dysphagia   . Pulmonary nodule, right   . Left renal artery stenosis (HCC) 02/13/2015  . Dysphasia 02/13/2015  . Acute kidney injury (HCC) 02/13/2015  . Stage III chronic kidney disease (HCC) 02/13/2015  . Pancreatic mass 02/13/2015  . Protein-calorie malnutrition, severe (HCC) 09/18/2013  . Murmur 09/17/2013  . Chest pain 09/16/2013  . Glaucoma 09/16/2013  . S/P laparotomy 06/10/2012  . Tobacco abuse 04/12/2012  . HTN (hypertension) 04/12/2012  . Benign breast cysts in female 04/12/2012  . Anxiety 04/12/2012  . Lung nodules, ?incidental 04/12/2012  . Chronic constipation   . COPD (chronic obstructive pulmonary disease) (HCC)     Past Surgical History:  Procedure Laterality Date  . BILATERAL OOPHORECTOMY  2009   PROCEDURE:  diagnostic laparoscopy, laparotomy with bilateral salpingo-  . LAPAROTOMY  04/23/2012   Procedure: EXPLORATORY LAPAROTOMY;  Surgeon: Valarie Merino, MD;  Location: [facility];  Service: General;  Laterality: N/A;  enterolysis      OB History   None      Home Medications    Prior to Admission medications   Medication Sig Start Date End Date Taking? Authorizing Provider  acetaminophen (TYLENOL) 500 MG tablet Take 1 tablet (500 mg total) by mouth every 6 (six) hours as needed. Patient not taking: Reported on 04/03/2016 03/07/15   Kerrie Buffalo,  Hira C, PA-C  amLODipine (NORVASC) 5 MG tablet Take 1 tablet (5 mg total) by mouth daily. 07/17/17   Palumbo, April, MD  aspirin 325 MG tablet Take 325 mg by mouth every morning.     [provider]  brimonidine (ALPHAGAN) 0.15 % ophthalmic solution USE 1 DROP INTO LEFT EYE TWICE A DAY AS DIRECTED 08/27/17   [provider]  feeding supplement, ENSURE ENLIVE, (ENSURE ENLIVE) LIQD Take 237 mLs  by mouth 2 (two) times daily between meals. Patient not taking: Reported on 04/03/2016 02/14/15   Rama, Maryruth Bunhristina P, MD  hydrochlorothiazide (HYDRODIURIL) 25 MG tablet Take 1 tablet (25 mg total) by mouth daily. Patient not taking: Reported on 08/14/2017 07/17/17   Palumbo, April, MD  omeprazole (PRILOSEC) 20 MG capsule Take 1 capsule (20 mg total) by mouth daily. Patient not taking: Reported on 04/03/2016 02/14/15   Rama, Maryruth Bunhristina P, MD  ondansetron (ZOFRAN) 4 MG tablet Take 1 tablet (4 mg total) by mouth every 6 (six) hours as needed for nausea. Patient not taking: Reported on 04/03/2016 02/14/15   Rama, Maryruth Bunhristina P, MD  polyethylene glycol (MIRALAX / GLYCOLAX) packet Take 17 g by mouth daily. Patient not taking: Reported on 04/03/2016 02/14/15   Rama, Maryruth Bunhristina P, MD  vitamin B-12 1000 MCG tablet Take 1 tablet (1,000 mcg total) by mouth daily. Patient not taking: Reported on 07/17/2017 09/18/13   Renae FickleShort, Mackenzie, MD    Family History Family History  Problem Relation Age of Onset  . Diabetes Son   . High blood pressure Son   . Coronary artery disease Son     Social History Social History   Tobacco Use  . Smoking status: Former Smoker    Packs/day: 0.80    Years: 60.00    Pack years: 48.00    Types: Cigarettes    Last attempt to quit: 03/20/2014    Years since quitting: 3.7  . Smokeless tobacco: Never Used  Substance Use Topics  . Alcohol use: No  . Drug use: No     Allergies   Patient has no known allergies.   Review of Systems Review of Systems  Constitutional: Positive for fatigue. Negative for chills, diaphoresis and fever.  HENT: Negative for congestion and rhinorrhea.   Eyes: Negative for visual disturbance.  Respiratory: Negative for cough, chest tightness, shortness of breath and wheezing.   Cardiovascular: Negative for chest pain and palpitations.  Gastrointestinal: Positive for abdominal pain, anorexia, constipation and nausea. Negative for diarrhea, flatus  (decrased) and vomiting.  Genitourinary: Negative for dysuria, flank pain and frequency.  Musculoskeletal: Negative for back pain, neck pain and neck stiffness.  Skin: Negative for rash and wound.  Neurological: Negative for weakness, light-headedness, numbness and headaches.  Psychiatric/Behavioral: Negative for agitation.  All other systems reviewed and are negative.    Physical Exam Updated Vital Signs BP (!) 144/67 (BP Location: Right Arm)   Pulse 67   Temp 98.1 F (36.7 C) (Oral)   Resp (!) 24   Ht 5\' 3"  (1.6 m)   Wt 36.3 kg (80 lb)   SpO2 99%   BMI 14.17 kg/m   Physical Exam  Constitutional: She is oriented to person, place, and time. She appears well-developed and well-nourished.  Non-toxic appearance. She does not appear ill. No distress.  HENT:  Head: Normocephalic and atraumatic.  Mouth/Throat: Oropharynx is clear and moist.  Eyes: Pupils are equal, round, and reactive to light. EOM are normal.  Neck: Normal range of motion.  Cardiovascular:  Normal rate and intact distal pulses.  No murmur heard. Pulmonary/Chest: Effort normal and breath sounds normal. No stridor. No respiratory distress. She has no wheezes. She has no rales. She exhibits no tenderness.  Abdominal: She exhibits distension. There is generalized tenderness. There is no rigidity, no rebound, no guarding and no CVA tenderness.  Musculoskeletal: She exhibits no edema or tenderness.  Lymphadenopathy:    She has no cervical adenopathy.  Neurological: She is alert and oriented to person, place, and time. No sensory deficit. She exhibits normal muscle tone.  Skin: Capillary refill takes less than 2 seconds. No rash noted. She is not diaphoretic. No erythema.  Psychiatric: She has a normal mood and affect.  Nursing note and vitals reviewed.    ED Treatments / Results  Labs (all labs ordered are listed, but only abnormal results are displayed) Labs Reviewed  BASIC METABOLIC PANEL - Abnormal; Notable for  the following components:      Result Value   Creatinine, Ser 1.06 (*)    GFR calc non Af Amer 48 (*)    GFR calc Af Amer 55 (*)    All other components within normal limits  URINALYSIS, ROUTINE W REFLEX MICROSCOPIC - Abnormal; Notable for the following components:   Leukocytes, UA TRACE (*)    All other components within normal limits  URINE CULTURE  CBC  HEPATIC FUNCTION PANEL  LIPASE, BLOOD  I-STAT TROPONIN, ED  I-STAT CG4 LACTIC ACID, ED  I-STAT CG4 LACTIC ACID, ED    EKG EKG Interpretation  Date/Time:  Thursday December 23 2017 18:35:21 EDT Ventricular Rate:  69 PR Interval:    QRS Duration: 92 QT Interval:  403 QTC Calculation: 432 R Axis:   -39 Text Interpretation:  Sinus rhythm Probable left atrial enlargement Left ventricular hypertrophy Anterior Q waves, possibly due to LVH When compared to prior, no significant changes seen.  No STEMI Confirmed by Theda Belfast ([id number]) on 12/23/2017 6:59:03 PM   Radiology Dg Chest 2 View  Result Date: 12/23/2017 CLINICAL DATA:  Patient c/o body aches with nausea x1 week. Also reports generalized chest pain with SOB x1 week. Hx COPD, HTN; ex smoker EXAM: CHEST - 2 VIEW COMPARISON:  08/13/2017 FINDINGS: Cardiac silhouette is mildly enlarged. No mediastinal or hilar masses. No evidence of adenopathy. Lungs are hyperexpanded.  Lungs are clear. No pleural effusion or pneumothorax. Skeletal structures are demineralized. There is an S-shaped thoracolumbar scoliosis and a significant pectus excavatum deformity. No acute skeletal abnormality. IMPRESSION: 1. No acute cardiopulmonary disease. 2. COPD. Electronically Signed   By: Amie Portland M.D.   On: 12/23/2017 19:03   Ct Abdomen Pelvis W Contrast  Result Date: 12/23/2017 CLINICAL DATA:  One week worsening abdominal pain and distention. Nausea. Decreased oral intake. History of small bowel obstruction. EXAM: CT ABDOMEN AND PELVIS WITH CONTRAST TECHNIQUE: Multidetector CT imaging of the abdomen and  pelvis was performed using the standard protocol following bolus administration of intravenous contrast. CONTRAST:  25mL OMNIPAQUE IOHEXOL 300 MG/ML SOLN, 75mL ISOVUE-300 IOPAMIDOL (ISOVUE-300) INJECTION 61% COMPARISON:  02/11/2015 FINDINGS: Lower chest: Pectus excavatum. Mild dependent changes in the lung bases. Cardiac enlargement. Hepatobiliary: No focal liver abnormality is seen. No gallstones, gallbladder wall thickening, or biliary dilatation. Pancreas: Unremarkable. No pancreatic ductal dilatation or surrounding inflammatory changes. Spleen: Normal in size without focal abnormality. Adrenals/Urinary Tract: Severe atrophy of the left kidney with asymmetrical delayed nephrogram. Large calcification in the upper pole of the left kidney. Appearance is consistent with chronic process and unchanged  since previous study. Simple appearing cyst on the lower pole right kidney. No hydronephrosis or hydroureter. Bladder wall is not thickened. Stomach/Bowel: Stomach, small bowel, and colon are not abnormally distended. Scattered stool in the colon. No wall thickening or inflammatory infiltration is appreciated. Appendix is not identified. Vascular/Lymphatic: Aortic atherosclerosis. No enlarged abdominal or pelvic lymph nodes. Dilated intrahepatic IVC may indicate reflux due to right heart failure. Reproductive: Uterus and bilateral adnexa are unremarkable. Other: No free air or free fluid in the abdomen. Abdominal wall musculature is thinned but appears intact. Musculoskeletal: Tarlov cysts in the sacrum. Degenerative changes in the lumbar spine. Lumbar scoliosis convex towards the left. No destructive bone lesions. IMPRESSION: 1. No evidence of bowel obstruction or inflammation. 2. Severe left renal atrophy consistent with chronic renal vascular disease. No change since prior study. 3. Cardiac enlargement with dilated intrahepatic IVC suggesting right heart failure. 4. Aortic atherosclerosis. Electronically Signed   By:  Burman Nieves M.D.   On: 12/23/2017 23:17    Procedures Procedures (including critical care time)  Medications Ordered in ED Medications  fentaNYL (SUBLIMAZE) injection 50 mcg (50 mcg Intravenous Given 12/23/17 2027)  ondansetron (ZOFRAN) injection 4 mg (4 mg Intravenous Given 12/23/17 2026)  iopamidol (ISOVUE-300) 61 % injection 75 mL (75 mLs Intravenous Contrast Given 12/23/17 2257)  iohexol (OMNIPAQUE) 300 MG/ML solution 25 mL (25 mLs Oral Contrast Given 12/23/17 2256)     Initial Impression / Assessment and Plan / ED Course  I have reviewed the triage vital signs and the nursing notes.  Pertinent labs & imaging results that were available during my care of the patient were reviewed by me and considered in my medical decision making (see chart for details).     Amber Rowland is a 84 y.o. female with a past medical history significant for COPD, kidney disease, hypertension and prior small bowel obstruction who presents with abdominal pain, nausea, shortness of breath, decreased flatus, and abdominal distention.  Patient reports that for the last week she has been having gradually worsening abdominal pain and cramping.  She reports that abdomen has been swelling.  She reports she had decreased flatus and has had constipation.  She reports her last bowel movement was several days ago.  She reports nausea but no vomiting.  She denies fevers, chills, or chest pain.  She reports some shortness of breath and feels like she has difficulty taking a deep breath due to the abdominal swelling.  She denies significant cough.  She does report fatigue.  She says she has not been able to eat or drink as much due to the abdominal discomfort.  It is worsened after eating or drinking.  On exam, abdomen is distended.  There is diffuse tenderness.  Lungs are clear.  Chest is nontender.  Legs are nonedematous.  No evidence of trauma seen.  Clinically I am concerned about recurrent small bowel obstruction  given the pain, swelling, and decreased flatus with constipation.  Patient will have laboratory testing and CT scan to further evaluate.  Due to the shortness of breath chest x-ray will also be obtained.    Anticipate reassessment after work-up.  11:41 PM CT scan showed no evidence of obstruction or inflammation.  Patient was seen to have a large stool ball which is the hard bulge she was likely feeling on exam.  Suspect the constipation is the cause of her symptoms.  Patient was found to have renal atrophy and cardiac enlargement which she is aware of.  Chest  x-ray showed no pneumonia.  COPD was seen.  No evidence of acute infection and lactic acid was negative x2.  Troponin was negative.  Given the patient's abdominal pain for several days, do not feel she needs a delta troponin.  Other laboratory testing similar or reassuring.    Patient given prescription for MiraLAX which she reports she is never taken.  She will follow-up with her primary doctor in several days.  Patient also  given prescription for her home blood pressure medicine which she reports she ran out of today and requested refill.    Patient understood return precautions and was discharged in good condition with improved symptoms.  Final Clinical Impressions(s) / ED Diagnoses   Final diagnoses:  Constipation, unspecified constipation type  Generalized abdominal pain    ED Discharge Orders        Ordered    polyethylene glycol (MIRALAX) packet  Daily     12/23/17 2338    amLODipine (NORVASC) 5 MG tablet  Daily     12/23/17 2338      Clinical Impression: 1. Constipation, unspecified constipation type   2. Generalized abdominal pain     Disposition: Discharge  Condition: Good  I have discussed the results, Dx and Tx plan with the pt(& family if present). He/she/they expressed understanding and agree(s) with the plan. Discharge instructions discussed at great length. Strict return precautions discussed and pt &/or  family have verbalized understanding of the instructions. No further questions at time of discharge.    New Prescriptions   AMLODIPINE (NORVASC) 5 MG TABLET    Take 1 tablet (5 mg total) by mouth daily.   POLYETHYLENE GLYCOL (MIRALAX) PACKET    Take 17 g by mouth daily.    Follow Up: [facility] [address] [place] [address] [phone] Schedule an appointment as soon as possible for a visit    [facility]GENC[facility] DEPT [address] [id number] mc [place] [address] [phone]       Booker Bhatnagar, Canary Brim, MD 12/23/17 [phone]

## 2017-12-25 LAB — URINE CULTURE: Culture: NO GROWTH

## 2017-12-30 DIAGNOSIS — H401134 Primary open-angle glaucoma, bilateral, indeterminate stage: Secondary | ICD-10-CM | POA: Diagnosis not present

## 2018-01-20 DIAGNOSIS — H43821 Vitreomacular adhesion, right eye: Secondary | ICD-10-CM | POA: Diagnosis not present

## 2018-03-07 DIAGNOSIS — H1131 Conjunctival hemorrhage, right eye: Secondary | ICD-10-CM | POA: Diagnosis not present

## 2018-05-22 ENCOUNTER — Encounter (HOSPITAL_COMMUNITY): Payer: Self-pay

## 2018-05-22 ENCOUNTER — Emergency Department (HOSPITAL_COMMUNITY)
Admission: EM | Admit: 2018-05-22 | Discharge: 2018-05-22 | Disposition: A | Discharge status: Home / Self Care | Payer: Medicare Other | Attending: Emergency Medicine | Admitting: Emergency Medicine

## 2018-05-22 DIAGNOSIS — N183 Chronic kidney disease, stage 3 (moderate): Secondary | ICD-10-CM | POA: Insufficient documentation

## 2018-05-22 DIAGNOSIS — I10 Essential (primary) hypertension: Secondary | ICD-10-CM | POA: Diagnosis not present

## 2018-05-22 DIAGNOSIS — Z7982 Long term (current) use of aspirin: Secondary | ICD-10-CM | POA: Insufficient documentation

## 2018-05-22 DIAGNOSIS — J449 Chronic obstructive pulmonary disease, unspecified: Secondary | ICD-10-CM | POA: Insufficient documentation

## 2018-05-22 DIAGNOSIS — R51 Headache: Secondary | ICD-10-CM | POA: Diagnosis not present

## 2018-05-22 DIAGNOSIS — I129 Hypertensive chronic kidney disease with stage 1 through stage 4 chronic kidney disease, or unspecified chronic kidney disease: Secondary | ICD-10-CM | POA: Insufficient documentation

## 2018-05-22 DIAGNOSIS — Z87891 Personal history of nicotine dependence: Secondary | ICD-10-CM | POA: Insufficient documentation

## 2018-05-22 DIAGNOSIS — R040 Epistaxis: Secondary | ICD-10-CM | POA: Insufficient documentation

## 2018-05-22 DIAGNOSIS — R0902 Hypoxemia: Secondary | ICD-10-CM | POA: Diagnosis not present

## 2018-05-22 DIAGNOSIS — Z79899 Other long term (current) drug therapy: Secondary | ICD-10-CM | POA: Insufficient documentation

## 2018-05-22 DIAGNOSIS — Z760 Encounter for issue of repeat prescription: Secondary | ICD-10-CM | POA: Diagnosis not present

## 2018-05-22 LAB — CBC
HCT: 37.8 % (ref 36.0–46.0)
HEMOGLOBIN: 11.6 g/dL — AB (ref 12.0–15.0)
MCH: 30.1 pg (ref 26.0–34.0)
MCHC: 30.7 g/dL (ref 30.0–36.0)
MCV: 97.9 fL (ref 80.0–100.0)
NRBC: 0 % (ref 0.0–0.2)
PLATELETS: 199 10*3/uL (ref 150–400)
RBC: 3.86 MIL/uL — AB (ref 3.87–5.11)
RDW: 12.7 % (ref 11.5–15.5)
WBC: 5.6 10*3/uL (ref 4.0–10.5)

## 2018-05-22 MED ORDER — SILVER NITRATE-POT NITRATE 75-25 % EX MISC
1.0000 "application " | Freq: Once | CUTANEOUS | Status: AC
  Administered 2018-05-22: 1 via TOPICAL
  Filled 2018-05-22: qty 1

## 2018-05-22 MED ORDER — AMLODIPINE BESYLATE 5 MG PO TABS: 5.0000 mg | ORAL_TABLET | Freq: Every day | ORAL | 1 refills | Status: DC

## 2018-05-22 MED ORDER — OXYMETAZOLINE HCL 0.05 % NA SOLN
1.0000 | Freq: Once | NASAL | Status: AC
  Administered 2018-05-22: 1 via NASAL
  Filled 2018-05-22: qty 15

## 2018-05-22 MED ORDER — SALINE SPRAY 0.65 % NA SOLN: 1.0000 | NASAL | 0 refills | Status: DC | PRN

## 2018-05-22 NOTE — Discharge Instructions (Signed)
Thank you for allowing me to care for you today in the Emergency Department.   If your nose starts to bleed at home again, you can squirt 1 to 2 sprays of Afrin (oxymetolazone).  Then, pinch the 2 nostrils of your nose together and lean forward.  Continue pinching the nostrils for 10 minutes.  It is important make sure the lean forward because blood dripping down the back of your throat can cause irritation and cause vomiting.  Use 1-2 squirts of Ocean nasal spray as needed to help keep the nose moist.  If you can keep the inside of the nose moist it may decrease recurrent nosebleeds.   Take your home amlodipine daily.  Continue to take 1 tablet of aspirin daily as prescribed.  Please continue to use her eyedrops at home as prescribed to improve your headaches.  If you continue to have bleeding that will not stop after applying pressure by pinching her nose and using the Afrin nose spray or if you start passing large clots of blood, shortness of breath or chest pain, or other concerning symptoms, return to the emergency department for reevaluation.

## 2018-05-22 NOTE — ED Provider Notes (Signed)
[facility]-EMERGENCY DEPT Provider Note   CSN: [id number] Arrival date & time: 05/22/18  1720     History   Chief Complaint Chief Complaint  Patient presents with  . Epistaxis    HPI Amber Rowland is a 84 y.o. female with a history of glaucoma, COPD, HTN, CKD stage III, left renal artery stenosis, and tobacco use disorder who presents to the emergency department by EMS with a chief complaint of nosebleed.   The patient endorses a right-sided nosebleed that began prior to arrival.  She states that she awoke from a nap in her recliner to find blood on her clothes so she called 911.  She reports this is the third nosebleed from her right nostril this week, but this is the first time she has sought treatment.  She states the last 2 episodes started after she pinched the bony portion of her nose.  Denies passing any clots.  No history of epistaxis.  She denies bleeding from her gums, hematuria, hemoptysis, recent injury to the nose, shortness of breath, pallor, or dyspnea.  She reports her arthritis has been acting up so she has been spending a lot more time sitting and napping next to a space heater because it helps with the pain.  She reports that she has stopped taking all of her medicines this week because she was concerned that the nosebleed was from bleeding in her brain because she takes aspirin well as the rest of her home medication.  She reports that she has developed more frequent headaches, located around her eyes, since she stopped taking the medication.  She suspects it is because she has glaucoma and has not been taking her drops. She also reports she has been out of her home amlodipine for the last 3 weeks and is requesting a refill medication.     The history is provided by the patient. No language interpreter was used.    Past Medical History:  Diagnosis Date  . Atrophy of left kidney    Chronic atrophy of the left kidney.  . Benign breast cysts  in female 04/12/2012   Seen on mammography 2009   . Chronic constipation   . COPD (chronic obstructive pulmonary disease) (HCC)    Hyperinflation and heavy smoking history strongly  . Ejection fraction   . Esophageal dysmotility 02/14/2015  . Glaucoma (increased eye pressure)   . HTN (hypertension) 04/12/2012  . Ileus following gastrointestinal surgery (HCC) 04/28/2012  . Malnutrition (HCC) 04/28/2012  . Ovarian cyst, right 2009   s/p BSO 2009: OVARIAN FIBROTHECOMA in multiple fluid filled cysts  4.5cm region  . SBO (small bowel obstruction) (HCC)   . Severe malnutrition (HCC) 09/16/2013  . Stage III chronic kidney disease (HCC) 02/13/2015  . UTI (lower urinary tract infection)    History of chronic recurrent UTIs    Patient Active Problem List   Diagnosis Date Noted  . Pressure ulcer 02/14/2015  . Esophageal dysmotility 02/14/2015  . Atherosclerosis of aorta With small ruptured plaque 02/14/2015  . Abdominal distention   . Bloating   . Dysphagia   . Pulmonary nodule, right   . Left renal artery stenosis (HCC) 02/13/2015  . Dysphasia 02/13/2015  . Acute kidney injury (HCC) 02/13/2015  . Stage III chronic kidney disease (HCC) 02/13/2015  . Pancreatic mass 02/13/2015  . Protein-calorie malnutrition, severe (HCC) 09/18/2013  . Murmur 09/17/2013  . Chest pain 09/16/2013  . Glaucoma 09/16/2013  . S/P laparotomy 06/10/2012  . Tobacco abuse  04/12/2012  . HTN (hypertension) 04/12/2012  . Benign breast cysts in female 04/12/2012  . Anxiety 04/12/2012  . Lung nodules, ?incidental 04/12/2012  . Chronic constipation   . COPD (chronic obstructive pulmonary disease) (HCC)     Past Surgical History:  Procedure Laterality Date  . BILATERAL OOPHORECTOMY  2009   PROCEDURE:  diagnostic laparoscopy, laparotomy with bilateral salpingo-  . LAPAROTOMY  04/23/2012   Procedure: EXPLORATORY LAPAROTOMY;  Surgeon: Valarie MerinoMatthew B Martin, MD;  Location: [facility];  Service: General;  Laterality: N/A;   enterolysis      OB History   None      Home Medications    Prior to Admission medications   Medication Sig Start Date End Date Taking? Authorizing Provider  ALPHAGAN P 0.1 % SOLN Place 1 drop into both eyes 3 (three) times daily. 04/22/18  Yes [provider]  dorzolamide (TRUSOPT) 2 % ophthalmic solution Place 1 drop into both eyes 2 (two) times daily. 04/22/18  Yes [provider]  amLODipine (NORVASC) 5 MG tablet Take 1 tablet (5 mg total) by mouth daily. 05/22/18 06/21/18  Manuelito Poage A, PA-C  sodium chloride (OCEAN) 0.65 % SOLN nasal spray Place 1 spray into both nostrils as needed for congestion. 05/22/18   Zakyria Metzinger A, PA-C    Family History Family History  Problem Relation Age of Onset  . Diabetes Son   . High blood pressure Son   . Coronary artery disease Son     Social History Social History   Tobacco Use  . Smoking status: Former Smoker    Packs/day: 0.80    Years: 60.00    Pack years: 48.00    Types: Cigarettes    Last attempt to quit: 03/20/2014    Years since quitting: 4.1  . Smokeless tobacco: Never Used  Substance Use Topics  . Alcohol use: No  . Drug use: No     Allergies   Patient has no known allergies.   Review of Systems Review of Systems  Constitutional: Negative for activity change.  HENT: Positive for nosebleeds.   Respiratory: Negative for shortness of breath.   Cardiovascular: Negative for chest pain.  Gastrointestinal: Negative for abdominal pain.  Genitourinary: Negative for dysuria.  Musculoskeletal: Negative for back pain.  Skin: Negative for rash.  Allergic/Immunologic: Negative for immunocompromised state.  Neurological: Positive for headaches.  Psychiatric/Behavioral: Negative for confusion.   Physical Exam Updated Vital Signs BP (!) 149/73 (BP Location: Left Arm)   Pulse 70   Temp (!) 97.4 F (36.3 C) (Oral)   Resp 16   SpO2 100%   Physical Exam  Constitutional: No distress.  Cachectic,  chronically ill-appearing, elderly female.  HENT:  Head: Normocephalic.  Right Ear: Tympanic membrane and external ear normal.  Left Ear: Tympanic membrane and external ear normal.  Nose: No rhinorrhea or nasal septal hematoma. Epistaxis is observed. Right sinus exhibits no maxillary sinus tenderness and no frontal sinus tenderness. Left sinus exhibits no maxillary sinus tenderness.  Mouth/Throat: Uvula is midline, oropharynx is clear and moist and mucous membranes are normal.  Dried blood is noted in the right nare.  There is a pinpoint area of mucosa in the right nare, located anteriorly on the right septum that is oozing a minimal amount of bright red blood.  Left nare is clear.  No blood is noted in the posterior oropharynx.  Eyes: Conjunctivae are normal.  Neck: Neck supple.  Cardiovascular: Normal rate, regular rhythm, normal heart sounds and intact distal  pulses. Exam reveals no gallop and no friction rub.  No murmur heard. Pulmonary/Chest: Effort normal. No stridor. No respiratory distress. She has no wheezes. She has no rales. She exhibits no tenderness.  Abdominal: Soft. She exhibits no distension.  Neurological: She is alert.  Skin: Skin is warm. No rash noted.  Psychiatric: Her behavior is normal.  Nursing note and vitals reviewed.    ED Treatments / Results  Labs (all labs ordered are listed, but only abnormal results are displayed) Labs Reviewed  CBC - Abnormal; Notable for the following components:      Result Value   RBC 3.86 (*)    Hemoglobin 11.6 (*)    All other components within normal limits    EKG None  Radiology No results found.  Procedures .Epistaxis Management Date/Time: 05/23/2018 1:14 AM Performed by: Barkley Boards, PA-C Authorized by: Barkley Boards, PA-C   Consent:    Consent obtained:  Verbal   Consent given by:  Patient   Risks discussed:  Bleeding, nasal injury and pain   Alternatives discussed:  No treatment and  observation Anesthesia (see MAR for exact dosages):    Anesthesia method:  None Procedure details:    Treatment site:  R anterior   Treatment method:  Silver nitrate   Treatment complexity:  Limited   Treatment episode: initial   Post-procedure details:    Assessment:  Bleeding stopped   Patient tolerance of procedure:  Tolerated well, no immediate complications   (including critical care time)  Medications Ordered in ED Medications  oxymetazoline (AFRIN) 0.05 % nasal spray 1 spray (1 spray Each Nare Given 05/22/18 2038)  silver nitrate applicators applicator 1 application (1 application Topical Given by Other 05/22/18 2230)     Initial Impression / Assessment and Plan / ED Course  I have reviewed the triage vital signs and the nursing notes.  Pertinent labs & imaging results that were available during my care of the patient were reviewed by me and considered in my medical decision making (see chart for details).     84 year old female with a history of glaucoma, COPD, HTN, CKD stage III, left renal artery stenosis, and tobacco use disorder who presents to the emergency department with a chief complaint of right-sided epistaxis.  She reports this is the third episode this week, but the first episode for which she has sought treatment.  She was discussed with Dr. Pilar Plate, attending physician.  On my initial exam, there is dried blood in the right nare and a pinpoint area of oozing located along the mucosa of the septum in the right nare.  Posterior oropharynx is clear.  She has had no episodes of vomiting today.  Hemoglobin is 11.6.  Afrin was squirted into the right nare and then silver nitrate was used to cauterize the area of pinpoint bleeding.  On re-examination, no active oozing or bleeding.  I suspect her symptoms are secondary to the dry air and spending more time next to the heater, which is drying out her mucosa.  The patient also reports she has been having recurrent headaches.   States that she stopped taking all of her medications because she thought that may be causing the nosebleed.  I suspect her headaches, since they are located in the periorbital region, are secondary to not taking her dorzolamide eyedrops for glaucoma.  She is also requested a prescription for her home Norvasc, which I have provided.  Recommended she follow-up with her primary care provider for recheck  in 3 to 4 days.  Education on home treatment for epistaxis has been provided.  Strict return precautions given.  Patient is hemodynamically stable and in no acute distress.  She is safe for discharge home with outpatient follow-up at this time.  Final Clinical Impressions(s) / ED Diagnoses   Final diagnoses:  Right-sided epistaxis    ED Discharge Orders         Ordered    amLODipine (NORVASC) 5 MG tablet  Daily     05/22/18 2143    sodium chloride (OCEAN) 0.65 % SOLN nasal spray  As needed     05/22/18 2143           Gwendelyn Lanting, Coral Else, PA-C 05/23/18 0119    Sabas Sous, MD 05/24/18 1057

## 2018-05-22 NOTE — ED Triage Notes (Signed)
Per EMS: Pt is coming from home. Pt has reportedly been out of her HTN meds for 3 weeks. Pt reportedly has no PCP and normally comes to the ER for her prescriptions. Pt reportedly was napping in her recliner and woke up to find blood on her and called 911. Pt's last reported BP was 169/98

## 2018-08-26 ENCOUNTER — Encounter (HOSPITAL_COMMUNITY): Payer: Self-pay | Admitting: *Deleted

## 2018-08-26 ENCOUNTER — Other Ambulatory Visit: Payer: Self-pay

## 2018-08-26 ENCOUNTER — Emergency Department (HOSPITAL_COMMUNITY): Payer: Medicare Other

## 2018-08-26 ENCOUNTER — Emergency Department (HOSPITAL_COMMUNITY)
Admission: EM | Admit: 2018-08-26 | Discharge: 2018-08-26 | Disposition: A | Discharge status: Home / Self Care | Payer: Medicare Other | Attending: Emergency Medicine | Admitting: Emergency Medicine

## 2018-08-26 DIAGNOSIS — Z87891 Personal history of nicotine dependence: Secondary | ICD-10-CM | POA: Insufficient documentation

## 2018-08-26 DIAGNOSIS — I2510 Atherosclerotic heart disease of native coronary artery without angina pectoris: Secondary | ICD-10-CM | POA: Insufficient documentation

## 2018-08-26 DIAGNOSIS — N183 Chronic kidney disease, stage 3 (moderate): Secondary | ICD-10-CM | POA: Insufficient documentation

## 2018-08-26 DIAGNOSIS — Z7982 Long term (current) use of aspirin: Secondary | ICD-10-CM | POA: Insufficient documentation

## 2018-08-26 DIAGNOSIS — Z79899 Other long term (current) drug therapy: Secondary | ICD-10-CM | POA: Diagnosis not present

## 2018-08-26 DIAGNOSIS — I129 Hypertensive chronic kidney disease with stage 1 through stage 4 chronic kidney disease, or unspecified chronic kidney disease: Secondary | ICD-10-CM | POA: Insufficient documentation

## 2018-08-26 DIAGNOSIS — J449 Chronic obstructive pulmonary disease, unspecified: Secondary | ICD-10-CM | POA: Insufficient documentation

## 2018-08-26 DIAGNOSIS — R1084 Generalized abdominal pain: Secondary | ICD-10-CM | POA: Diagnosis not present

## 2018-08-26 DIAGNOSIS — R079 Chest pain, unspecified: Secondary | ICD-10-CM

## 2018-08-26 DIAGNOSIS — K469 Unspecified abdominal hernia without obstruction or gangrene: Secondary | ICD-10-CM | POA: Diagnosis not present

## 2018-08-26 DIAGNOSIS — R0602 Shortness of breath: Secondary | ICD-10-CM | POA: Insufficient documentation

## 2018-08-26 DIAGNOSIS — K458 Other specified abdominal hernia without obstruction or gangrene: Secondary | ICD-10-CM

## 2018-08-26 DIAGNOSIS — F419 Anxiety disorder, unspecified: Secondary | ICD-10-CM | POA: Diagnosis not present

## 2018-08-26 DIAGNOSIS — N200 Calculus of kidney: Secondary | ICD-10-CM | POA: Diagnosis not present

## 2018-08-26 LAB — CBC WITH DIFFERENTIAL/PLATELET
ABS IMMATURE GRANULOCYTES: 0.01 10*3/uL (ref 0.00–0.07)
Basophils Absolute: 0 10*3/uL (ref 0.0–0.1)
Basophils Relative: 1 %
Eosinophils Absolute: 0.1 10*3/uL (ref 0.0–0.5)
Eosinophils Relative: 1 %
HCT: 39.2 % (ref 36.0–46.0)
Hemoglobin: 11.7 g/dL — ABNORMAL LOW (ref 12.0–15.0)
Immature Granulocytes: 0 %
LYMPHS ABS: 1.1 10*3/uL (ref 0.7–4.0)
LYMPHS PCT: 20 %
MCH: 29.5 pg (ref 26.0–34.0)
MCHC: 29.8 g/dL — ABNORMAL LOW (ref 30.0–36.0)
MCV: 98.7 fL (ref 80.0–100.0)
Monocytes Absolute: 0.5 10*3/uL (ref 0.1–1.0)
Monocytes Relative: 10 %
Neutro Abs: 3.7 10*3/uL (ref 1.7–7.7)
Neutrophils Relative %: 68 %
Platelets: 205 10*3/uL (ref 150–400)
RBC: 3.97 MIL/uL (ref 3.87–5.11)
RDW: 12.9 % (ref 11.5–15.5)
WBC: 5.4 10*3/uL (ref 4.0–10.5)
nRBC: 0 % (ref 0.0–0.2)

## 2018-08-26 LAB — COMPREHENSIVE METABOLIC PANEL
ALT: 18 U/L (ref 0–44)
AST: 30 U/L (ref 15–41)
Albumin: 3.9 g/dL (ref 3.5–5.0)
Alkaline Phosphatase: 113 U/L (ref 38–126)
Anion gap: 10 (ref 5–15)
BUN: 11 mg/dL (ref 8–23)
CO2: 31 mmol/L (ref 22–32)
Calcium: 9.2 mg/dL (ref 8.9–10.3)
Chloride: 95 mmol/L — ABNORMAL LOW (ref 98–111)
Creatinine, Ser: 0.92 mg/dL (ref 0.44–1.00)
GFR calc Af Amer: >60 mL/min (ref >60)
GFR calc non Af Amer: 58 mL/min — ABNORMAL LOW (ref >60)
GLUCOSE: 91 mg/dL (ref 70–99)
Potassium: 3.5 mmol/L (ref 3.5–5.1)
SODIUM: 136 mmol/L (ref 135–145)
Total Bilirubin: 0.4 mg/dL (ref 0.3–1.2)
Total Protein: 7.2 g/dL (ref 6.5–8.1)

## 2018-08-26 LAB — BRAIN NATRIURETIC PEPTIDE: B Natriuretic Peptide: 78.6 pg/mL (ref 0.0–100.0)

## 2018-08-26 LAB — TROPONIN I
Troponin I: <0.03 ng/mL (ref <0.03)
Troponin I: <0.03 ng/mL (ref <0.03)

## 2018-08-26 MED ORDER — AMLODIPINE BESYLATE 5 MG PO TABS: 5.0000 mg | ORAL_TABLET | Freq: Every day | ORAL | 1 refills | Status: DC

## 2018-08-26 MED ORDER — ALBUTEROL SULFATE HFA 108 (90 BASE) MCG/ACT IN AERS
1.0000 | INHALATION_SPRAY | Freq: Once | RESPIRATORY_TRACT | Status: AC
  Administered 2018-08-26: 1 via RESPIRATORY_TRACT
  Filled 2018-08-26: qty 6.7

## 2018-08-26 MED ORDER — DOCUSATE SODIUM 100 MG PO CAPS: 100.0000 mg | ORAL_CAPSULE | Freq: Two times a day (BID) | ORAL | 0 refills | Status: DC

## 2018-08-26 MED ORDER — DIAZEPAM 2 MG PO TABS
2.0000 mg | ORAL_TABLET | Freq: Once | ORAL | Status: AC
  Administered 2018-08-26: 2 mg via ORAL
  Filled 2018-08-26: qty 1

## 2018-08-26 MED ORDER — PREDNISONE 10 MG PO TABS: 50.0000 mg | ORAL_TABLET | Freq: Every day | ORAL | 0 refills | Status: DC

## 2018-08-26 NOTE — ED Provider Notes (Signed)
[facility]-EMERGENCY DEPT Provider Note   CSN: [id number] Arrival date & time: 08/26/18  1645    History   Chief Complaint Chief Complaint  Patient presents with  . Abdominal Pain    HPI Amber Rowland is a 85 y.o. female.  HPI: A 85 year old patient with a history of hypertension presents for evaluation of chest pain. Initial onset of pain was approximately 3-6 hours ago. The patient's chest pain is described as heaviness/pressure/tightness and is not worse with exertion. The patient's chest pain is not middle- or left-sided, is not well-localized, is not sharp and does not radiate to the arms/jaw/neck. The patient does not complain of nausea and denies diaphoresis. The patient has no history of stroke, has no history of peripheral artery disease, has not smoked in the past 90 days, denies any history of treated diabetes, has no relevant family history of coronary artery disease (first degree relative at less than age 68), has no history of hypercholesterolemia and does not have an elevated BMI (>=30).   HPI  85 year old female comes in a chief complaint of abdominal pain.  She is also having some discomfort in her chest. She has history of COPD, hypertension, small bowel obstruction and large ventral hernia.  She reports that over the past few days she has been having increased discomfort in her abdomen.  She has been feeling more constipated and because of that she has been putting more strain leading to her hernia becoming more painful.  She has been having irregular bowel movements.  She is not having any nausea or vomiting, but does have reduced p.o. intake.   Patient's chest discomfort is located diffusely over her chest and described as tightness.  Typically her symptoms come about at nighttime.  She denies any exertional component to it.  Patient does not take any blood thinners and does not have a PCP to follow-up with right now.  She has a cough but it is  chronic in nature.  She is no longer an active smoker.   Past Medical History:  Diagnosis Date  . Atrophy of left kidney    Chronic atrophy of the left kidney.  . Benign breast cysts in female 04/12/2012   Seen on mammography 2009   . Chronic constipation   . COPD (chronic obstructive pulmonary disease) (HCC)    Hyperinflation and heavy smoking history strongly  . Ejection fraction   . Esophageal dysmotility 02/14/2015  . Glaucoma (increased eye pressure)   . HTN (hypertension) 04/12/2012  . Ileus following gastrointestinal surgery (HCC) 04/28/2012  . Malnutrition (HCC) 04/28/2012  . Ovarian cyst, right 2009   s/p BSO 2009: OVARIAN FIBROTHECOMA in multiple fluid filled cysts  4.5cm region  . SBO (small bowel obstruction) (HCC)   . Severe malnutrition (HCC) 09/16/2013  . Stage III chronic kidney disease (HCC) 02/13/2015  . UTI (lower urinary tract infection)    History of chronic recurrent UTIs    Patient Active Problem List   Diagnosis Date Noted  . Pressure ulcer 02/14/2015  . Esophageal dysmotility 02/14/2015  . Atherosclerosis of aorta With small ruptured plaque 02/14/2015  . Abdominal distention   . Bloating   . Dysphagia   . Pulmonary nodule, right   . Left renal artery stenosis (HCC) 02/13/2015  . Dysphasia 02/13/2015  . Acute kidney injury (HCC) 02/13/2015  . Stage III chronic kidney disease (HCC) 02/13/2015  . Pancreatic mass 02/13/2015  . Protein-calorie malnutrition, severe (HCC) 09/18/2013  . Murmur 09/17/2013  .  Chest pain 09/16/2013  . Glaucoma 09/16/2013  . S/P laparotomy 06/10/2012  . Tobacco abuse 04/12/2012  . HTN (hypertension) 04/12/2012  . Benign breast cysts in female 04/12/2012  . Anxiety 04/12/2012  . Lung nodules, ?incidental 04/12/2012  . Chronic constipation   . COPD (chronic obstructive pulmonary disease) (HCC)     Past Surgical History:  Procedure Laterality Date  . BILATERAL OOPHORECTOMY  2009   PROCEDURE:  diagnostic laparoscopy,  laparotomy with bilateral salpingo-  . LAPAROTOMY  04/23/2012   Procedure: EXPLORATORY LAPAROTOMY;  Surgeon: Valarie Merino, MD;  Location: [facility];  Service: General;  Laterality: N/A;  enterolysis      OB History   No obstetric history on file.      Home Medications    Prior to Admission medications   Medication Sig Start Date End Date Taking? Authorizing Provider  ALPHAGAN P 0.1 % SOLN Place 1 drop into both eyes 3 (three) times daily. 04/22/18  Yes [provider]  aspirin 325 MG tablet Take 325 mg by mouth daily.   Yes [provider]  dorzolamide (TRUSOPT) 2 % ophthalmic solution Place 1 drop into both eyes 2 (two) times daily. 04/22/18  Yes [provider]  Multiple Vitamins-Minerals (CENTRUM SILVER PO) Take 1 tablet by mouth daily.   Yes [provider]  amLODipine (NORVASC) 5 MG tablet Take 1 tablet (5 mg total) by mouth daily for 30 days. 08/26/18 09/25/18  Derwood Kaplan, MD  predniSONE (DELTASONE) 10 MG tablet Take 5 tablets (50 mg total) by mouth daily. 08/26/18   Derwood Kaplan, MD  sodium chloride (OCEAN) 0.65 % SOLN nasal spray Place 1 spray into both nostrils as needed for congestion. 05/22/18   McDonald, Mia A, PA-C    Family History Family History  Problem Relation Age of Onset  . Diabetes Son   . High blood pressure Son   . Coronary artery disease Son     Social History Social History   Tobacco Use  . Smoking status: Former Smoker    Packs/day: 0.80    Years: 60.00    Pack years: 48.00    Types: Cigarettes    Last attempt to quit: 03/20/2014    Years since quitting: 4.4  . Smokeless tobacco: Never Used  Substance Use Topics  . Alcohol use: No  . Drug use: No     Allergies   Patient has no known allergies.   Review of Systems Review of Systems  Constitutional: Positive for activity change.  Respiratory: Positive for shortness of breath. Negative for wheezing.   Cardiovascular: Positive for chest pain.    Gastrointestinal: Positive for abdominal pain.  Allergic/Immunologic: Negative for immunocompromised state.  Hematological: Does not bruise/bleed easily.  All other systems reviewed and are negative.    Physical Exam Updated Vital Signs BP 127/89   Pulse 69   Temp 97.9 F (36.6 C) (Oral)   Resp 13   SpO2 100%   Physical Exam Vitals signs and nursing note reviewed.  Constitutional:      Appearance: She is well-developed.  HENT:     Head: Normocephalic and atraumatic.  Neck:     Musculoskeletal: Normal range of motion and neck supple.  Cardiovascular:     Rate and Rhythm: Normal rate.  Pulmonary:     Effort: Pulmonary effort is normal.  Abdominal:     General: Bowel sounds are normal.     Palpations: Abdomen is soft.     Tenderness: There is generalized abdominal  tenderness. There is no guarding or rebound.     Hernia: A hernia is present. Hernia is present in the umbilical area.     Comments: Hernia is reducible, but not completely  Skin:    General: Skin is warm and dry.  Neurological:     Mental Status: She is alert and oriented to person, place, and time.      ED Treatments / Results  Labs (all labs ordered are listed, but only abnormal results are displayed) Labs Reviewed  CBC WITH DIFFERENTIAL/PLATELET - Abnormal; Notable for the following components:      Result Value   Hemoglobin 11.7 (*)    MCHC 29.8 (*)    All other components within normal limits  COMPREHENSIVE METABOLIC PANEL - Abnormal; Notable for the following components:   Chloride 95 (*)    GFR calc non Af Amer 58 (*)    All other components within normal limits  BRAIN NATRIURETIC PEPTIDE  TROPONIN I  TROPONIN I    EKG EKG Interpretation  Date/Time:  Friday August 26 2018 18:14:26 EST Ventricular Rate:  79 PR Interval:    QRS Duration: 102 QT Interval:  407 QTC Calculation: 467 R Axis:   -24 Text Interpretation:  Sinus rhythm Ventricular premature complex Left atrial enlargement  LVH with secondary repolarization abnormality Inferior infarct, old Probable anterior infarct, age indeterminate No acute changes Confirmed by Derwood Kaplan [phone]) on 08/26/2018 6:21:44 PM   Radiology Dg Abd Acute 2+v W 1v Chest  Result Date: 08/26/2018 CLINICAL DATA:  Mid abdominal pain since yesterday. Known hernia. Constipation. EXAM: DG ABDOMEN ACUTE W/ 1V CHEST COMPARISON:  Chest radiographs dated 12/23/2017 and abdomen and pelvis CT dated 12/23/2017. FINDINGS: Stable enlarged cardiac silhouette, tortuous aorta and hyperexpanded lungs with mild diffuse prominence of the interstitial markings and biapical pleural and parenchymal scarring. Moderate thoracolumbar scoliosis is unchanged. Normal bowel gas pattern without free peritoneal air. 12 mm oval calcification in the left mid abdomen corresponding to a calculus in the previously demonstrated severely atrophied left kidney. IMPRESSION: 1. No acute abnormality. 2. Stable cardiomegaly and changes of COPD. 3. 12 mm left renal calculus. Electronically Signed   By: Beckie Salts M.D.   On: 08/26/2018 18:22    Procedures Procedures (including critical care time)  Medications Ordered in ED Medications  diazepam (VALIUM) tablet 2 mg (2 mg Oral Given 08/26/18 1812)  albuterol (PROVENTIL HFA;VENTOLIN HFA) 108 (90 Base) MCG/ACT inhaler 1 puff (1 puff Inhalation Given 08/26/18 2144)     Initial Impression / Assessment and Plan / ED Course  I have reviewed the triage vital signs and the nursing notes.  Pertinent labs & imaging results that were available during my care of the patient were reviewed by me and considered in my medical decision making (see chart for details).     HEAR Score: 1  85 year old female with history of COPD, large umbilical hernia comes into the ER with chief complaint of chest discomfort and also abdominal discomfort. Her chest pain is nonspecific.  She does not have any history of coronary artery disease.  She does have COPD  history, however it does not appear that she is wheezing, has a new cough or has any new respiratory discomfort.  We will get delta troponin for her chest discomfort. HEAR SCORE IS 3.  Additionally patient is also having abdominal discomfort.  She has known history of hernia, at this time the hernia is reducible although maybe not in entirety.  Abdomen is soft, there  is no peritoneal findings and with the hernia being large in nature we do not think she is incarcerated. Acute abdominal series ordered to rule out any evidence of ileus or small bowel obstruction.   10:45 PM Results from the ER workup discussed with the patient face to face and all questions answered to the best of my ability.  She has been advised to follow-up with [facility] for primary care and a pulmonologist for appropriate COPD management. She will return to the ER if her chest pain gets worse. Patient has been given amlodipine for her blood pressure and we also sent her home with albuterol inhaler and a prescription for prednisone.  Final Clinical Impressions(s) / ED Diagnoses   Final diagnoses:  Chronic obstructive pulmonary disease, unspecified COPD type (HCC)  Chest pain, unspecified type  Generalized abdominal pain  Recurrent abdominal hernia without obstruction or gangrene, unspecified hernia type    ED Discharge Orders         Ordered    predniSONE (DELTASONE) 10 MG tablet  Daily     08/26/18 2245    amLODipine (NORVASC) 5 MG tablet  Daily     08/26/18 2245           Derwood Kaplan, MD 08/26/18 2246

## 2018-08-26 NOTE — ED Triage Notes (Signed)
Pt complains of mid abdominal pain since yesterday. Pt has a hernia that feels worse since straining while having a bowel movement yesterday d/t constipation.

## 2018-08-26 NOTE — Discharge Instructions (Signed)
We signed the ER for chest discomfort and abdominal pain. The results in the ER did not show any concerning findings.  However given your age and chronic medical condition it is prudent that you follow-up with a primary care doctor and a pulmonologist appropriate management of your disease.  Please return to the ER if your symptoms worsen; you have increased pain, fevers, chills, inability to keep any medications down, confusion. Otherwise see the outpatient doctor as requested.

## 2018-09-08 DIAGNOSIS — H43393 Other vitreous opacities, bilateral: Secondary | ICD-10-CM | POA: Diagnosis not present

## 2018-10-13 DIAGNOSIS — H401123 Primary open-angle glaucoma, left eye, severe stage: Secondary | ICD-10-CM | POA: Diagnosis not present

## 2018-10-13 DIAGNOSIS — H401112 Primary open-angle glaucoma, right eye, moderate stage: Secondary | ICD-10-CM | POA: Diagnosis not present

## 2018-10-18 ENCOUNTER — Emergency Department (HOSPITAL_COMMUNITY)
Admission: EM | Admit: 2018-10-18 | Discharge: 2018-10-18 | Disposition: A | Discharge status: Home / Self Care | Payer: Medicare Other | Attending: Emergency Medicine | Admitting: Emergency Medicine

## 2018-10-18 ENCOUNTER — Encounter (HOSPITAL_COMMUNITY): Payer: Self-pay

## 2018-10-18 ENCOUNTER — Emergency Department (HOSPITAL_COMMUNITY): Payer: Medicare Other

## 2018-10-18 ENCOUNTER — Ambulatory Visit (HOSPITAL_COMMUNITY)
Admission: EM | Admit: 2018-10-18 | Discharge: 2018-10-18 | Disposition: A | Discharge status: Home / Self Care | Payer: Medicare Other | Source: Home / Self Care

## 2018-10-18 ENCOUNTER — Other Ambulatory Visit: Payer: Self-pay

## 2018-10-18 DIAGNOSIS — F419 Anxiety disorder, unspecified: Secondary | ICD-10-CM | POA: Insufficient documentation

## 2018-10-18 DIAGNOSIS — J449 Chronic obstructive pulmonary disease, unspecified: Secondary | ICD-10-CM | POA: Insufficient documentation

## 2018-10-18 DIAGNOSIS — M79602 Pain in left arm: Secondary | ICD-10-CM | POA: Diagnosis not present

## 2018-10-18 DIAGNOSIS — Z87891 Personal history of nicotine dependence: Secondary | ICD-10-CM | POA: Diagnosis not present

## 2018-10-18 DIAGNOSIS — Z7982 Long term (current) use of aspirin: Secondary | ICD-10-CM | POA: Diagnosis not present

## 2018-10-18 DIAGNOSIS — Z79899 Other long term (current) drug therapy: Secondary | ICD-10-CM | POA: Insufficient documentation

## 2018-10-18 DIAGNOSIS — R079 Chest pain, unspecified: Secondary | ICD-10-CM | POA: Diagnosis not present

## 2018-10-18 DIAGNOSIS — R0602 Shortness of breath: Secondary | ICD-10-CM | POA: Diagnosis not present

## 2018-10-18 DIAGNOSIS — N183 Chronic kidney disease, stage 3 (moderate): Secondary | ICD-10-CM | POA: Insufficient documentation

## 2018-10-18 DIAGNOSIS — I2510 Atherosclerotic heart disease of native coronary artery without angina pectoris: Secondary | ICD-10-CM | POA: Diagnosis not present

## 2018-10-18 DIAGNOSIS — I129 Hypertensive chronic kidney disease with stage 1 through stage 4 chronic kidney disease, or unspecified chronic kidney disease: Secondary | ICD-10-CM | POA: Insufficient documentation

## 2018-10-18 LAB — BASIC METABOLIC PANEL
Anion gap: 9 (ref 5–15)
BUN: 9 mg/dL (ref 8–23)
CO2: 31 mmol/L (ref 22–32)
Calcium: 9.6 mg/dL (ref 8.9–10.3)
Chloride: 96 mmol/L — ABNORMAL LOW (ref 98–111)
Creatinine, Ser: 0.9 mg/dL (ref 0.44–1.00)
GFR calc Af Amer: >60 mL/min (ref >60)
GFR calc non Af Amer: 59 mL/min — ABNORMAL LOW (ref >60)
Glucose, Bld: 94 mg/dL (ref 70–99)
Potassium: 3.9 mmol/L (ref 3.5–5.1)
Sodium: 136 mmol/L (ref 135–145)

## 2018-10-18 LAB — CBC
HCT: 39.7 % (ref 36.0–46.0)
Hemoglobin: 12.5 g/dL (ref 12.0–15.0)
MCH: 30.3 pg (ref 26.0–34.0)
MCHC: 31.5 g/dL (ref 30.0–36.0)
MCV: 96.4 fL (ref 80.0–100.0)
Platelets: 200 10*3/uL (ref 150–400)
RBC: 4.12 MIL/uL (ref 3.87–5.11)
RDW: 12.8 % (ref 11.5–15.5)
WBC: 6 10*3/uL (ref 4.0–10.5)
nRBC: 0 % (ref 0.0–0.2)

## 2018-10-18 LAB — TROPONIN I: Troponin I: <0.03 ng/mL (ref <0.03)

## 2018-10-18 MED ORDER — HYDROCODONE-ACETAMINOPHEN 5-325 MG PO TABS
1.0000 | ORAL_TABLET | Freq: Once | ORAL | Status: AC
  Administered 2018-10-18: 1 via ORAL
  Filled 2018-10-18: qty 1

## 2018-10-18 MED ORDER — SODIUM CHLORIDE 0.9% FLUSH: 3.0000 mL | Freq: Once | INTRAVENOUS | Status: DC

## 2018-10-18 MED ORDER — TRAMADOL HCL 50 MG PO TABS: 50.0000 mg | ORAL_TABLET | Freq: Two times a day (BID) | ORAL | 0 refills | Status: DC | PRN

## 2018-10-18 MED ORDER — IBUPROFEN 200 MG PO TABS
400.0000 mg | ORAL_TABLET | Freq: Once | ORAL | Status: AC
  Administered 2018-10-18: 400 mg via ORAL
  Filled 2018-10-18: qty 2

## 2018-10-18 NOTE — ED Provider Notes (Signed)
[facility]-EMERGENCY DEPT Provider Note   CSN: [id number] Arrival date & time: 10/18/18  1311    History   Chief Complaint Chief Complaint  Patient presents with  . Chest Pain  . Shortness of Breath  . Generalized Body Aches  . Fatigue    HPI Amber Rowland is a 85 y.o. female.  HPI  85yF with L upper chest, L shoulder and L arm pain. Also numbness in l forerarm/hands/fingers. Onset yesterday. Seemed to resolve after a couple hours and then noticed again when she woke up this morning. Denies associated symptoms such as dyspnea, nausea, diaphoresis, palpitations. No acute neck pain. No change with movement. Not exertional.   Past Medical History:  Diagnosis Date  . Atrophy of left kidney    Chronic atrophy of the left kidney.  . Benign breast cysts in female 04/12/2012   Seen on mammography 2009   . Chronic constipation   . COPD (chronic obstructive pulmonary disease) (HCC)    Hyperinflation and heavy smoking history strongly  . Ejection fraction   . Esophageal dysmotility 02/14/2015  . Glaucoma (increased eye pressure)   . HTN (hypertension) 04/12/2012  . Ileus following gastrointestinal surgery (HCC) 04/28/2012  . Malnutrition (HCC) 04/28/2012  . Ovarian cyst, right 2009   s/p BSO 2009: OVARIAN FIBROTHECOMA in multiple fluid filled cysts  4.5cm region  . SBO (small bowel obstruction) (HCC)   . Severe malnutrition (HCC) 09/16/2013  . Stage III chronic kidney disease (HCC) 02/13/2015  . UTI (lower urinary tract infection)    History of chronic recurrent UTIs    Patient Active Problem List   Diagnosis Date Noted  . Pressure ulcer 02/14/2015  . Esophageal dysmotility 02/14/2015  . Atherosclerosis of aorta With small ruptured plaque 02/14/2015  . Abdominal distention   . Bloating   . Dysphagia   . Pulmonary nodule, right   . Left renal artery stenosis (HCC) 02/13/2015  . Dysphasia 02/13/2015  . Acute kidney injury (HCC) 02/13/2015  . Stage  III chronic kidney disease (HCC) 02/13/2015  . Pancreatic mass 02/13/2015  . Protein-calorie malnutrition, severe (HCC) 09/18/2013  . Murmur 09/17/2013  . Chest pain 09/16/2013  . Glaucoma 09/16/2013  . S/P laparotomy 06/10/2012  . Tobacco abuse 04/12/2012  . HTN (hypertension) 04/12/2012  . Benign breast cysts in female 04/12/2012  . Anxiety 04/12/2012  . Lung nodules, ?incidental 04/12/2012  . Chronic constipation   . COPD (chronic obstructive pulmonary disease) (HCC)     Past Surgical History:  Procedure Laterality Date  . BILATERAL OOPHORECTOMY  2009   PROCEDURE:  diagnostic laparoscopy, laparotomy with bilateral salpingo-  . LAPAROTOMY  04/23/2012   Procedure: EXPLORATORY LAPAROTOMY;  Surgeon: Valarie MerinoMatthew B Martin, MD;  Location: [facility];  Service: General;  Laterality: N/A;  enterolysis      OB History   No obstetric history on file.      Home Medications    Prior to Admission medications   Medication Sig Start Date End Date Taking? Authorizing Provider  ALPHAGAN P 0.1 % SOLN Place 1 drop into both eyes 3 (three) times daily. 04/22/18   [provider]  amLODipine (NORVASC) 5 MG tablet Take 1 tablet (5 mg total) by mouth daily for 30 days. 08/26/18 09/25/18  Derwood KaplanNanavati, Ankit, MD  aspirin 325 MG tablet Take 325 mg by mouth daily.    [provider]  docusate sodium (COLACE) 100 MG capsule Take 1 capsule (100 mg total) by mouth every 12 (twelve) hours. 08/26/18  Derwood Kaplan, MD  dorzolamide (TRUSOPT) 2 % ophthalmic solution Place 1 drop into both eyes 2 (two) times daily. 04/22/18   [provider]  Multiple Vitamins-Minerals (CENTRUM SILVER PO) Take 1 tablet by mouth daily.    [provider]  predniSONE (DELTASONE) 10 MG tablet Take 5 tablets (50 mg total) by mouth daily. 08/26/18   Derwood Kaplan, MD  sodium chloride (OCEAN) 0.65 % SOLN nasal spray Place 1 spray into both nostrils as needed for congestion. 05/22/18   McDonald, Mia A, PA-C     Family History Family History  Problem Relation Age of Onset  . Diabetes Son   . High blood pressure Son   . Coronary artery disease Son     Social History Social History   Tobacco Use  . Smoking status: Former Smoker    Packs/day: 0.80    Years: 60.00    Pack years: 48.00    Types: Cigarettes    Last attempt to quit: 03/20/2014    Years since quitting: 4.5  . Smokeless tobacco: Never Used  Substance Use Topics  . Alcohol use: No  . Drug use: No     Allergies   Patient has no known allergies.   Review of Systems Review of Systems  All systems reviewed and negative, other than as noted in HPI.  Physical Exam Updated Vital Signs BP (!) 148/85 (BP Location: Right Arm)   Pulse 78   Temp 98.1 F (36.7 C) (Oral)   Resp 16   Ht 5' (1.524 m)   Wt 37.2 kg   SpO2 97%   BMI 16.01 kg/m   Physical Exam Vitals signs and nursing note reviewed.  Constitutional:      General: She is not in acute distress.    Appearance: She is well-developed.  HENT:     Head: Normocephalic and atraumatic.  Eyes:     General:        Right eye: No discharge.        Left eye: No discharge.     Conjunctiva/sclera: Conjunctivae normal.  Neck:     Musculoskeletal: Neck supple.  Cardiovascular:     Rate and Rhythm: Normal rate and regular rhythm.     Heart sounds: Normal heart sounds. No murmur. No friction rub. No gallop.   Pulmonary:     Effort: Pulmonary effort is normal. No respiratory distress.     Breath sounds: Normal breath sounds.  Abdominal:     General: There is no distension.     Palpations: Abdomen is soft.     Tenderness: There is no abdominal tenderness.  Musculoskeletal:        General: No tenderness.     Comments: LUE symmetric as compared to R. No swelling or other skin lesions. Normal strength and ROM. Sensation intact to light touch.   Skin:    General: Skin is warm and dry.  Neurological:     Mental Status: She is alert.  Psychiatric:        Behavior:  Behavior normal.        Thought Content: Thought content normal.      ED Treatments / Results  Labs (all labs ordered are listed, but only abnormal results are displayed) Labs Reviewed  BASIC METABOLIC PANEL - Abnormal; Notable for the following components:      Result Value   Chloride 96 (*)    GFR calc non Af Amer 59 (*)    All other components within normal limits  CBC  TROPONIN I    EKG EKG Interpretation  Date/Time:  Tuesday October 18 2018 13:30:32 EDT Ventricular Rate:  81 PR Interval:    QRS Duration: 94 QT Interval:  399 QTC Calculation: 464 R Axis:   -33 Text Interpretation:  Sinus rhythm Probable left atrial enlargement Left ventricular hypertrophy Anterior Q waves, possibly due to LVH Baseline wander in lead(s) III aVF Confirmed by Raeford Razor [phone]) on 10/18/2018 1:47:33 PM   Radiology Dg Chest 2 View  Result Date: 10/18/2018 CLINICAL DATA:  85 year old female complaining of chest pain and shortness of breath EXAM: CHEST - 2 VIEW COMPARISON:  Prior chest x-ray and acute abdominal series 08/26/2018 FINDINGS: Stable cardiac and mediastinal contours. Atherosclerotic calcifications are present in the transverse aorta. Right-greater-than-left apical pleuroparenchymal scarring. Background of hyperinflation, emphysema and diffuse bronchitic changes remain stable. No edema, new airspace consolidation, pleural effusion or pneumothorax. No acute osseous abnormality. Advanced dextroconvex scoliosis and multilevel degenerative disc disease again noted. IMPRESSION: Stable chest x-ray without evidence of acute cardiopulmonary process. Electronically Signed   By: Malachy Moan M.D.   On: 10/18/2018 13:53    Procedures Procedures (including critical care time)  Medications Ordered in ED Medications  sodium chloride flush (NS) 0.9 % injection 3 mL (3 mLs Intravenous Not Given 10/18/18 1437)  HYDROcodone-acetaminophen (NORCO/VICODIN) 5-325 MG per tablet 1 tablet (has no  administration in time range)  ibuprofen (ADVIL) tablet 400 mg (has no administration in time range)     Initial Impression / Assessment and Plan / ED Course  I have reviewed the triage vital signs and the nursing notes.  Pertinent labs & imaging results that were available during my care of the patient were reviewed by me and considered in my medical decision making (see chart for details).       85yF with L upper chest, shoulder, L arm pain. Suspect this is cervical radiculopathy. Seems atypical for ACS. She is NVI. Ed w/u fairly unrevealing. Plan symptomatic tx. Return precautions discussed. Outpt FU otherwise.   Final Clinical Impressions(s) / ED Diagnoses   Final diagnoses:  Left arm pain    ED Discharge Orders    None       Raeford Razor, MD 10/19/18 [phone]

## 2018-10-18 NOTE — ED Triage Notes (Signed)
Patient c/o left chest pain, SOB, left arm pain, generalized body aches and fatigue since yesterday

## 2018-10-18 NOTE — ED Notes (Signed)
Patient has chest "tightness" patient has left arm pain, patient says she has issues with this and no different.  Patient's visitor reports patient has not exhibited any change in baseline behavior or complained or described chronic issues any differently than in the past.    Spoke to [place], pa and traci, np about patient agreed ED setting offers blood work that the ucc setting does not.  Patient and visitor state understanding and elect to go to ED

## 2018-11-03 ENCOUNTER — Other Ambulatory Visit: Payer: Self-pay

## 2018-11-03 ENCOUNTER — Encounter (HOSPITAL_COMMUNITY): Payer: Self-pay | Admitting: Emergency Medicine

## 2018-11-03 ENCOUNTER — Telehealth: Payer: Self-pay

## 2018-11-03 ENCOUNTER — Ambulatory Visit (HOSPITAL_COMMUNITY)
Admission: EM | Admit: 2018-11-03 | Discharge: 2018-11-03 | Disposition: A | Discharge status: Home / Self Care | Payer: Medicare Other | Attending: Family Medicine | Admitting: Family Medicine

## 2018-11-03 DIAGNOSIS — B349 Viral infection, unspecified: Secondary | ICD-10-CM | POA: Diagnosis not present

## 2018-11-03 DIAGNOSIS — Z20822 Contact with and (suspected) exposure to covid-19: Secondary | ICD-10-CM

## 2018-11-03 LAB — POCT URINALYSIS DIP (DEVICE)
Bilirubin Urine: NEGATIVE
Glucose, UA: NEGATIVE mg/dL
Hgb urine dipstick: NEGATIVE
Ketones, ur: NEGATIVE mg/dL
Leukocytes,Ua: NEGATIVE
Nitrite: NEGATIVE
Protein, ur: NEGATIVE mg/dL
Specific Gravity, Urine: 1.02 (ref 1.005–1.030)
Urobilinogen, UA: 0.2 mg/dL (ref 0.0–1.0)
pH: 8.5 — ABNORMAL HIGH (ref 5.0–8.0)

## 2018-11-03 NOTE — Discharge Instructions (Addendum)
I am worried that you may have COVID-19. Please go to the [facility] campus in the morning at 8 AM for testing You need to take precautions and quarantine.  Wear your mask and wash hands frequently.  Social distancing If you become short of breath and your symptoms severely worsen you need to go to the hospital You can take Tylenol for your fever and body aches Follow up as needed for continued or worsening symptoms

## 2018-11-03 NOTE — Telephone Encounter (Signed)
[facility] UC asking for testing appointment.

## 2018-11-03 NOTE — ED Triage Notes (Addendum)
Patient complains of symptoms starting this morning.  Reports: Chills Went to eye doctor-reported fever 102 No cough, no new sob Has a runny nose Denies sore throat  Patient has had frequent urination for a week, no pain

## 2018-11-04 ENCOUNTER — Other Ambulatory Visit: Payer: Self-pay

## 2018-11-04 DIAGNOSIS — R6889 Other general symptoms and signs: Secondary | ICD-10-CM | POA: Diagnosis not present

## 2018-11-04 DIAGNOSIS — Z20822 Contact with and (suspected) exposure to covid-19: Secondary | ICD-10-CM

## 2018-11-04 LAB — NOVEL CORONAVIRUS, NAA: SARS-CoV-2, NAA: NOT DETECTED

## 2018-11-04 NOTE — ED Provider Notes (Signed)
[facility]    CSN: [id number] Arrival date & time: 11/03/18  1444     History   Chief Complaint Chief Complaint  Patient presents with  . Chills    HPI Amber Rowland is a 85 y.o. female.   Pt is a 85 year old female with past medical history of COPD, hypertension, chronic constipation, glaucoma, chronic kidney disease.  That presents with fever, body aches, chills, headache and mild cough. This started this am. She went to the eye doctor this am and had fever of 102 and they couldn't see her. She is low grade here. She is feeling weak and has had some urinary frequency. The urinary frequency has been for a few weeks. No dysuria. Her symptoms have been constant. She was in the hospital a few weeks ago and found cervical radiculopathy. She has been staying home over the past few months due to COVID. Her only interaction is with her daughter that works at a plant. Her daughter has been well. Some nausea. No abdominal pain, chest pain, SOB. No vomiting or diarrhea. No recent travels.  ROS per HPI      Past Medical History:  Diagnosis Date  . Atrophy of left kidney    Chronic atrophy of the left kidney.  . Benign breast cysts in female 04/12/2012   Seen on mammography 2009   . Chronic constipation   . COPD (chronic obstructive pulmonary disease) (HCC)    Hyperinflation and heavy smoking history strongly  . Ejection fraction   . Esophageal dysmotility 02/14/2015  . Glaucoma (increased eye pressure)   . HTN (hypertension) 04/12/2012  . Ileus following gastrointestinal surgery (HCC) 04/28/2012  . Malnutrition (HCC) 04/28/2012  . Ovarian cyst, right 2009   s/p BSO 2009: OVARIAN FIBROTHECOMA in multiple fluid filled cysts  4.5cm region  . SBO (small bowel obstruction) (HCC)   . Severe malnutrition (HCC) 09/16/2013  . Stage III chronic kidney disease (HCC) 02/13/2015  . UTI (lower urinary tract infection)    History of chronic recurrent UTIs    Patient Active  Problem List   Diagnosis Date Noted  . Pressure ulcer 02/14/2015  . Esophageal dysmotility 02/14/2015  . Atherosclerosis of aorta With small ruptured plaque 02/14/2015  . Abdominal distention   . Bloating   . Dysphagia   . Pulmonary nodule, right   . Left renal artery stenosis (HCC) 02/13/2015  . Dysphasia 02/13/2015  . Acute kidney injury (HCC) 02/13/2015  . Stage III chronic kidney disease (HCC) 02/13/2015  . Pancreatic mass 02/13/2015  . Protein-calorie malnutrition, severe (HCC) 09/18/2013  . Murmur 09/17/2013  . Chest pain 09/16/2013  . Glaucoma 09/16/2013  . S/P laparotomy 06/10/2012  . Tobacco abuse 04/12/2012  . HTN (hypertension) 04/12/2012  . Benign breast cysts in female 04/12/2012  . Anxiety 04/12/2012  . Lung nodules, ?incidental 04/12/2012  . Chronic constipation   . COPD (chronic obstructive pulmonary disease) (HCC)     Past Surgical History:  Procedure Laterality Date  . BILATERAL OOPHORECTOMY  2009   PROCEDURE:  diagnostic laparoscopy, laparotomy with bilateral salpingo-  . LAPAROTOMY  04/23/2012   Procedure: EXPLORATORY LAPAROTOMY;  Surgeon: Valarie Merino, MD;  Location: [facility];  Service: General;  Laterality: N/A;  enterolysis     OB History   No obstetric history on file.      Home Medications    Prior to Admission medications   Medication Sig Start Date End Date Taking? Authorizing Provider  ALPHAGAN P 0.1 %  SOLN Place 1 drop into both eyes 3 (three) times daily. 04/22/18   [provider]  amLODipine (NORVASC) 5 MG tablet Take 1 tablet (5 mg total) by mouth daily for 30 days. 08/26/18 09/25/18  Derwood KaplanNanavati, Ankit, MD  aspirin 325 MG tablet Take 325 mg by mouth daily.    [provider]  docusate sodium (COLACE) 100 MG capsule Take 1 capsule (100 mg total) by mouth every 12 (twelve) hours. 08/26/18   Derwood KaplanNanavati, Ankit, MD  dorzolamide (TRUSOPT) 2 % ophthalmic solution Place 1 drop into both eyes 2 (two) times daily. 04/22/18   [provider]  Multiple Vitamins-Minerals (CENTRUM SILVER PO) Take 1 tablet by mouth daily.    [provider]  predniSONE (DELTASONE) 10 MG tablet Take 5 tablets (50 mg total) by mouth daily. 08/26/18   Derwood KaplanNanavati, Ankit, MD  sodium chloride (OCEAN) 0.65 % SOLN nasal spray Place 1 spray into both nostrils as needed for congestion. 05/22/18   McDonald, Mia A, PA-C  traMADol (ULTRAM) 50 MG tablet Take 1 tablet (50 mg total) by mouth every 12 (twelve) hours as needed. 10/18/18   Raeford RazorKohut, Stephen, MD    Family History Family History  Problem Relation Age of Onset  . Diabetes Son   . High blood pressure Son   . Coronary artery disease Son     Social History Social History   Tobacco Use  . Smoking status: Former Smoker    Packs/day: 0.80    Years: 60.00    Pack years: 48.00    Types: Cigarettes    Last attempt to quit: 03/20/2014    Years since quitting: 4.6  . Smokeless tobacco: Never Used  Substance Use Topics  . Alcohol use: No  . Drug use: No     Allergies   Patient has no known allergies.   Review of Systems Review of Systems   Physical Exam Triage Vital Signs ED Triage Vitals  Enc Vitals Group     BP 11/03/18 1515 (!) 175/96     Pulse Rate 11/03/18 1515 (!) 107     Resp 11/03/18 1515 18     Temp 11/03/18 1515 99.1 F (37.3 C)     Temp Source 11/03/18 1515 Oral     SpO2 11/03/18 1515 100 %     Weight --      Height --      Head Circumference --      Peak Flow --      Pain Score 11/03/18 1513 8     Pain Loc --      Pain Edu? --      Excl. in GC? --    No data found.  Updated Vital Signs BP (!) 175/96 (BP Location: Right Arm) Comment: small cuff  Pulse (!) 107   Temp 99.1 F (37.3 C) (Oral)   Resp 18   SpO2 100%   Visual Acuity Right Eye Distance:   Left Eye Distance:   Bilateral Distance:    Right Eye Near:   Left Eye Near:    Bilateral Near:     Physical Exam Vitals signs and nursing note reviewed.  Constitutional:      Appearance:  She is ill-appearing.  HENT:     Head: Normocephalic and atraumatic.     Nose: Nose normal.  Eyes:     Conjunctiva/sclera: Conjunctivae normal.  Cardiovascular:     Rate and Rhythm: Normal rate and regular rhythm.  Pulmonary:     Effort: Pulmonary  effort is normal.     Breath sounds: Normal breath sounds.  Skin:    General: Skin is warm and dry.  Neurological:     Mental Status: She is alert.  Psychiatric:        Mood and Affect: Mood normal.      UC Treatments / Results  Labs (all labs ordered are listed, but only abnormal results are displayed) Labs Reviewed  POCT URINALYSIS DIP (DEVICE) - Abnormal; Notable for the following components:      Result Value   pH 8.5 (*)    All other components within normal limits    EKG None  Radiology No results found.  Procedures Procedures (including critical care time)  Medications Ordered in UC Medications - No data to display  Initial Impression / Assessment and Plan / UC Course  I have reviewed the triage vital signs and the nursing notes.  Pertinent labs & imaging results that were available during my care of the patient were reviewed by me and considered in my medical decision making (see chart for details).    Viral illness   Urine negative for infection Based on symptoms and appearance most likely viral illness. Sending her for COVID testing in the morning.  Her daughter is taking her Already spoke with the site.  Pt understanding and agreed.   Final Clinical Impressions(s) / UC Diagnoses   Final diagnoses:  Viral illness     Discharge Instructions     I am worried that you may have COVID-19. Please go to the [facility] campus in the morning at 8 AM for testing You need to take precautions and quarantine.  Wear your mask and wash hands frequently.  Social distancing If you become short of breath and your symptoms severely worsen you need to go to the hospital You can take Tylenol for your fever and  body aches Follow up as needed for continued or worsening symptoms     ED Prescriptions    None     Controlled Substance Prescriptions [place] Controlled Substance Registry consulted? Not Applicable   Janace Aris, NP 11/04/18 [phone]

## 2018-11-07 ENCOUNTER — Telehealth: Payer: Self-pay | Admitting: *Deleted

## 2018-11-07 LAB — NOVEL CORONAVIRUS, NAA: SARS-CoV-2, NAA: NOT DETECTED

## 2018-11-07 NOTE — Telephone Encounter (Signed)
Called for results. No order nor results seen in epic. Informed patient we would call with results when it becomes available. Stated she understood and would wait for call.

## 2018-11-08 ENCOUNTER — Ambulatory Visit: Payer: Self-pay | Admitting: *Deleted

## 2018-11-08 ENCOUNTER — Telehealth (HOSPITAL_COMMUNITY): Payer: Self-pay | Admitting: Emergency Medicine

## 2018-11-08 NOTE — Telephone Encounter (Signed)
Your test for COVID-19 was negative.  Please continue good preventive care measures, including:  frequent hand-washing, avoid touching your face, cover coughs/sneezes, stay out of crowds and keep a 6 foot distance from others.  If you develop fever/cough/breathlessness, please stay home for 7 days and until you have had 3 consecutive days with cough/breathlessness improving and without fever (without taking a fever reducer). Go to the nearest hospital ED tent for assessment if fever/cough/breathlessness are severe or illness seems like a threat to life.  Patient contacted and made aware of all results, all questions answered.  

## 2018-11-08 NOTE — Telephone Encounter (Signed)
Daughter called in wanting COVID-19 test results.   Her mother was tested at the [facility] location.    I could not find any test results or anything in her chart not even an order. She did go to the Urgent Care.  The daughter said she thought someone told her it was negative.   I let her know we didn't have any information on results and to check back with the urgent care apparently they have her results since they ordered the test. She thanked me for my help and will call the urgent care.

## 2018-11-24 DIAGNOSIS — H43393 Other vitreous opacities, bilateral: Secondary | ICD-10-CM | POA: Diagnosis not present

## 2018-11-24 DIAGNOSIS — H401134 Primary open-angle glaucoma, bilateral, indeterminate stage: Secondary | ICD-10-CM | POA: Diagnosis not present

## 2018-12-08 DIAGNOSIS — H401112 Primary open-angle glaucoma, right eye, moderate stage: Secondary | ICD-10-CM | POA: Diagnosis not present

## 2018-12-08 DIAGNOSIS — H401123 Primary open-angle glaucoma, left eye, severe stage: Secondary | ICD-10-CM | POA: Diagnosis not present

## 2018-12-09 DIAGNOSIS — H43813 Vitreous degeneration, bilateral: Secondary | ICD-10-CM | POA: Diagnosis not present

## 2018-12-09 DIAGNOSIS — H5319 Other subjective visual disturbances: Secondary | ICD-10-CM | POA: Diagnosis not present

## 2018-12-09 DIAGNOSIS — H35363 Drusen (degenerative) of macula, bilateral: Secondary | ICD-10-CM | POA: Diagnosis not present

## 2018-12-09 DIAGNOSIS — H43392 Other vitreous opacities, left eye: Secondary | ICD-10-CM | POA: Diagnosis not present

## 2018-12-16 DIAGNOSIS — H43813 Vitreous degeneration, bilateral: Secondary | ICD-10-CM | POA: Diagnosis not present

## 2018-12-16 DIAGNOSIS — H43393 Other vitreous opacities, bilateral: Secondary | ICD-10-CM | POA: Diagnosis not present

## 2018-12-16 DIAGNOSIS — H35363 Drusen (degenerative) of macula, bilateral: Secondary | ICD-10-CM | POA: Diagnosis not present

## 2019-01-26 DIAGNOSIS — Z961 Presence of intraocular lens: Secondary | ICD-10-CM | POA: Diagnosis not present

## 2019-01-26 DIAGNOSIS — H401123 Primary open-angle glaucoma, left eye, severe stage: Secondary | ICD-10-CM | POA: Diagnosis not present

## 2019-01-26 DIAGNOSIS — H401112 Primary open-angle glaucoma, right eye, moderate stage: Secondary | ICD-10-CM | POA: Diagnosis not present

## 2019-01-30 ENCOUNTER — Emergency Department (HOSPITAL_COMMUNITY): Payer: Medicare Other

## 2019-01-30 ENCOUNTER — Emergency Department (HOSPITAL_COMMUNITY)
Admission: EM | Admit: 2019-01-30 | Discharge: 2019-01-30 | Disposition: A | Discharge status: Home / Self Care | Payer: Medicare Other | Attending: Emergency Medicine | Admitting: Emergency Medicine

## 2019-01-30 ENCOUNTER — Other Ambulatory Visit: Payer: Self-pay

## 2019-01-30 ENCOUNTER — Encounter (HOSPITAL_COMMUNITY): Payer: Self-pay | Admitting: Emergency Medicine

## 2019-01-30 DIAGNOSIS — Z79899 Other long term (current) drug therapy: Secondary | ICD-10-CM | POA: Insufficient documentation

## 2019-01-30 DIAGNOSIS — Z87891 Personal history of nicotine dependence: Secondary | ICD-10-CM | POA: Insufficient documentation

## 2019-01-30 DIAGNOSIS — R079 Chest pain, unspecified: Secondary | ICD-10-CM | POA: Diagnosis not present

## 2019-01-30 DIAGNOSIS — I129 Hypertensive chronic kidney disease with stage 1 through stage 4 chronic kidney disease, or unspecified chronic kidney disease: Secondary | ICD-10-CM | POA: Diagnosis not present

## 2019-01-30 DIAGNOSIS — R0602 Shortness of breath: Secondary | ICD-10-CM | POA: Diagnosis not present

## 2019-01-30 DIAGNOSIS — N183 Chronic kidney disease, stage 3 (moderate): Secondary | ICD-10-CM | POA: Insufficient documentation

## 2019-01-30 DIAGNOSIS — M542 Cervicalgia: Secondary | ICD-10-CM | POA: Diagnosis not present

## 2019-01-30 DIAGNOSIS — M5412 Radiculopathy, cervical region: Secondary | ICD-10-CM

## 2019-01-30 DIAGNOSIS — J449 Chronic obstructive pulmonary disease, unspecified: Secondary | ICD-10-CM | POA: Diagnosis not present

## 2019-01-30 DIAGNOSIS — Z7982 Long term (current) use of aspirin: Secondary | ICD-10-CM | POA: Diagnosis not present

## 2019-01-30 LAB — CBC
HCT: 42.5 % (ref 36.0–46.0)
Hemoglobin: 12.8 g/dL (ref 12.0–15.0)
MCH: 29.2 pg (ref 26.0–34.0)
MCHC: 30.1 g/dL (ref 30.0–36.0)
MCV: 97 fL (ref 80.0–100.0)
Platelets: 182 10*3/uL (ref 150–400)
RBC: 4.38 MIL/uL (ref 3.87–5.11)
RDW: 13 % (ref 11.5–15.5)
WBC: 5.8 10*3/uL (ref 4.0–10.5)
nRBC: 0 % (ref 0.0–0.2)

## 2019-01-30 LAB — BASIC METABOLIC PANEL
Anion gap: 10 (ref 5–15)
BUN: 15 mg/dL (ref 8–23)
CO2: 28 mmol/L (ref 22–32)
Calcium: 9.5 mg/dL (ref 8.9–10.3)
Chloride: 99 mmol/L (ref 98–111)
Creatinine, Ser: 1.09 mg/dL — ABNORMAL HIGH (ref 0.44–1.00)
GFR calc Af Amer: 54 mL/min — ABNORMAL LOW (ref >60)
GFR calc non Af Amer: 47 mL/min — ABNORMAL LOW (ref >60)
Glucose, Bld: 93 mg/dL (ref 70–99)
Potassium: 4.5 mmol/L (ref 3.5–5.1)
Sodium: 137 mmol/L (ref 135–145)

## 2019-01-30 LAB — TROPONIN I (HIGH SENSITIVITY): Troponin I (High Sensitivity): 4 ng/L (ref <18)

## 2019-01-30 MED ORDER — TRAMADOL HCL 50 MG PO TABS: 50.0000 mg | ORAL_TABLET | Freq: Two times a day (BID) | ORAL | 0 refills | Status: DC | PRN

## 2019-01-30 MED ORDER — PREDNISONE 10 MG PO TABS: 20.0000 mg | ORAL_TABLET | Freq: Every day | ORAL | 0 refills | Status: DC

## 2019-01-30 NOTE — ED Triage Notes (Signed)
Pt c/o left arm pain, back pain, neck pain that been hurting since last week and causing her not to sleep. Reports that having SOB and COPD.

## 2019-01-30 NOTE — ED Notes (Signed)
Call received from pt daughter Merilynn Finland [phone] requesting contact/update of pt d/c for pick-up. RN advised. [employer]

## 2019-01-30 NOTE — ED Notes (Signed)
An After Visit Summary was printed and given to the patient. Discharge instructions given and no further questions at this time.  

## 2019-01-30 NOTE — ED Notes (Signed)
Per Lab re-draw needed on lavender top d/t clotting...RN advised. [employer]

## 2019-01-30 NOTE — ED Provider Notes (Signed)
[facility]-EMERGENCY DEPT Provider Note   CSN: [id number] Arrival date & time: 01/30/19  1449    History   Chief Complaint Chief Complaint  Patient presents with  . Arm Pain  . Back Pain  . Neck Pain  . Shortness of Breath    HPI Amber Rowland is a 85 y.o. female.     HPI Patient presents with her neck back and left arm pain.  Been hurting since last week states she is not been sleeping well from it.  Aspirin helps a little but really not much.  Some dull chest pain at x2.  No fevers.  No cough.  States she has been short of breath but has history of COPD.  No new fall.  No numbness or weakness.  No headache.  No confusion.  Patient states she is been told before that there was something in her neck that would give her pain down the arm. Past Medical History:  Diagnosis Date  . Atrophy of left kidney    Chronic atrophy of the left kidney.  . Benign breast cysts in female 04/12/2012   Seen on mammography 2009   . Chronic constipation   . COPD (chronic obstructive pulmonary disease) (HCC)    Hyperinflation and heavy smoking history strongly  . Ejection fraction   . Esophageal dysmotility 02/14/2015  . Glaucoma (increased eye pressure)   . HTN (hypertension) 04/12/2012  . Ileus following gastrointestinal surgery (HCC) 04/28/2012  . Malnutrition (HCC) 04/28/2012  . Ovarian cyst, right 2009   s/p BSO 2009: OVARIAN FIBROTHECOMA in multiple fluid filled cysts  4.5cm region  . SBO (small bowel obstruction) (HCC)   . Severe malnutrition (HCC) 09/16/2013  . Stage III chronic kidney disease (HCC) 02/13/2015  . UTI (lower urinary tract infection)    History of chronic recurrent UTIs    Patient Active Problem List   Diagnosis Date Noted  . Pressure ulcer 02/14/2015  . Esophageal dysmotility 02/14/2015  . Atherosclerosis of aorta With small ruptured plaque 02/14/2015  . Abdominal distention   . Bloating   . Dysphagia   . Pulmonary nodule, right   .  Left renal artery stenosis (HCC) 02/13/2015  . Dysphasia 02/13/2015  . Acute kidney injury (HCC) 02/13/2015  . Stage III chronic kidney disease (HCC) 02/13/2015  . Pancreatic mass 02/13/2015  . Protein-calorie malnutrition, severe (HCC) 09/18/2013  . Murmur 09/17/2013  . Chest pain 09/16/2013  . Glaucoma 09/16/2013  . S/P laparotomy 06/10/2012  . Tobacco abuse 04/12/2012  . HTN (hypertension) 04/12/2012  . Benign breast cysts in female 04/12/2012  . Anxiety 04/12/2012  . Lung nodules, ?incidental 04/12/2012  . Chronic constipation   . COPD (chronic obstructive pulmonary disease) (HCC)     Past Surgical History:  Procedure Laterality Date  . BILATERAL OOPHORECTOMY  2009   PROCEDURE:  diagnostic laparoscopy, laparotomy with bilateral salpingo-  . LAPAROTOMY  04/23/2012   Procedure: EXPLORATORY LAPAROTOMY;  Surgeon: Valarie MerinoMatthew B Martin, MD;  Location: [facility];  Service: General;  Laterality: N/A;  enterolysis      OB History   No obstetric history on file.      Home Medications    Prior to Admission medications   Medication Sig Start Date End Date Taking? Authorizing Provider  ALPHAGAN P 0.1 % SOLN Place 1 drop into both eyes 3 (three) times daily. 04/22/18   [provider]  amLODipine (NORVASC) 5 MG tablet Take 1 tablet (5 mg total) by mouth daily for 30  days. 08/26/18 09/25/18  Derwood KaplanNanavati, Ankit, MD  aspirin 325 MG tablet Take 325 mg by mouth daily.    [provider]  docusate sodium (COLACE) 100 MG capsule Take 1 capsule (100 mg total) by mouth every 12 (twelve) hours. 08/26/18   Derwood KaplanNanavati, Ankit, MD  dorzolamide (TRUSOPT) 2 % ophthalmic solution Place 1 drop into both eyes 2 (two) times daily. 04/22/18   [provider]  Multiple Vitamins-Minerals (CENTRUM SILVER PO) Take 1 tablet by mouth daily.    [provider]  predniSONE (DELTASONE) 10 MG tablet Take 2 tablets (20 mg total) by mouth daily. 01/30/19   Benjiman CorePickering, Dayrin Stallone, MD  sodium chloride  (OCEAN) 0.65 % SOLN nasal spray Place 1 spray into both nostrils as needed for congestion. 05/22/18   McDonald, Mia A, PA-C  traMADol (ULTRAM) 50 MG tablet Take 1 tablet (50 mg total) by mouth every 12 (twelve) hours as needed. 01/30/19   Benjiman CorePickering, Markese Bloxham, MD    Family History Family History  Problem Relation Age of Onset  . Diabetes Son   . High blood pressure Son   . Coronary artery disease Son     Social History Social History   Tobacco Use  . Smoking status: Former Smoker    Packs/day: 0.80    Years: 60.00    Pack years: 48.00    Types: Cigarettes    Quit date: 03/20/2014    Years since quitting: 4.8  . Smokeless tobacco: Never Used  Substance Use Topics  . Alcohol use: No  . Drug use: No     Allergies   Patient has no known allergies.   Review of Systems Review of Systems  Constitutional: Negative for activity change, fatigue and fever.  HENT: Negative for congestion.   Respiratory: Positive for shortness of breath. Negative for choking and stridor.   Cardiovascular: Positive for chest pain.  Gastrointestinal: Negative for abdominal pain.  Genitourinary: Negative for flank pain.  Musculoskeletal: Positive for back pain and neck pain.  Skin: Negative for rash.  Neurological: Negative for weakness and numbness.     Physical Exam Updated Vital Signs BP (!) 138/109   Pulse 70   Temp 98.1 F (36.7 C) (Oral)   Resp 20   SpO2 98%   Physical Exam Vitals signs and nursing note reviewed.  Neck:     Musculoskeletal: Neck supple.     Comments: Mild left-sided paraspinal tenderness.  Kyphosis. Cardiovascular:     Rate and Rhythm: Normal rate and regular rhythm.  Pulmonary:     Comments: No focal rales or rhonchi Chest:     Chest wall: No tenderness.     Comments: Pectus excavatum. Abdominal:     Tenderness: There is no abdominal tenderness.  Musculoskeletal:     Right lower leg: No edema.     Left lower leg: No edema.     Comments: Kyphosis of thoracic  spine.  Skin:    General: Skin is warm.     Capillary Refill: Capillary refill takes less than 2 seconds.  Neurological:     Mental Status: She is alert and oriented to person, place, and time.      ED Treatments / Results  Labs (all labs ordered are listed, but only abnormal results are displayed) Labs Reviewed  BASIC METABOLIC PANEL - Abnormal; Notable for the following components:      Result Value   Creatinine, Ser 1.09 (*)    GFR calc non Af Amer 47 (*)    GFR  calc Af Amer 54 (*)    All other components within normal limits  CBC  TROPONIN I (HIGH SENSITIVITY)  TROPONIN I (HIGH SENSITIVITY)    EKG EKG Interpretation  Date/Time:  Monday January 30 2019 15:15:27 EDT Ventricular Rate:  73 PR Interval:    QRS Duration: 94 QT Interval:  387 QTC Calculation: 427 R Axis:   -47 Text Interpretation:  Sinus rhythm Left ventricular hypertrophy Anterior Q waves, possibly due to LVH Artifact in lead(s) I II III aVR aVL aVF V1 No significant change since last tracing Confirmed by Davonna Belling [phone]) on 01/30/2019 4:06:23 PM   Radiology Dg Chest 2 View  Result Date: 01/30/2019 CLINICAL DATA:  Shortness of breath. History of COPD and hypertension. EXAM: CHEST - 2 VIEW COMPARISON:  10/18/2018 FINDINGS: Cardiac silhouette is mildly enlarged. No mediastinal or hilar masses. No evidence of adenopathy. Lungs are hyperexpanded. Chronic scarring noted in the apices, greater on the right. No evidence of pneumonia or pulmonary edema. No pleural effusion or pneumothorax. Skeletal structures are demineralized. There is a stable thoracolumbar scoliosis and a prominent pectus excavatum deformity. IMPRESSION: No acute cardiopulmonary disease. Electronically Signed   By: Lajean Manes M.D.   On: 01/30/2019 15:31   Dg Cervical Spine Complete  Result Date: 01/30/2019 CLINICAL DATA:  Neck pain no trauma EXAM: CERVICAL SPINE - COMPLETE 4+ VIEW COMPARISON:  April 03, 2016 FINDINGS: The exam is  limited due to diffuse osteopenia . No definite fracture is noted in the posterior elements. There is diffuse degenerative changes with uncovertebral osteophytes and disc height loss. No prevertebral soft tissue swelling is seen. IMPRESSION: Limited examination due to diffuse osteopenia. No definite fracture of the posterior elements. Degenerative changes throughout the cervical spine. Electronically Signed   By: Prudencio Pair M.D.   On: 01/30/2019 17:07    Procedures Procedures (including critical care time)  Medications Ordered in ED Medications - No data to display   Initial Impression / Assessment and Plan / ED Course  I have reviewed the triage vital signs and the nursing notes.  Pertinent labs & imaging results that were available during my care of the patient were reviewed by me and considered in my medical decision making (see chart for details).        Patient with neck pain.  Goes down left arm.  I think this is likely cervical radiculopathy.  X-ray although limited was reassuring.  Will give short course of steroids and some pain medicine.  Has previously had tramadol.  Discharge home with outpatient follow-up.  Final Clinical Impressions(s) / ED Diagnoses   Final diagnoses:  Cervical radiculopathy    ED Discharge Orders         Ordered    predniSONE (DELTASONE) 10 MG tablet  Daily     01/30/19 1924    traMADol (ULTRAM) 50 MG tablet  Every 12 hours PRN     01/30/19 1924           Davonna Belling, MD 01/30/19 1925

## 2019-01-30 NOTE — Discharge Instructions (Addendum)
Follow up with your doctor

## 2019-03-31 DIAGNOSIS — H401112 Primary open-angle glaucoma, right eye, moderate stage: Secondary | ICD-10-CM | POA: Diagnosis not present

## 2019-03-31 DIAGNOSIS — H401123 Primary open-angle glaucoma, left eye, severe stage: Secondary | ICD-10-CM | POA: Diagnosis not present

## 2019-07-18 DIAGNOSIS — H43393 Other vitreous opacities, bilateral: Secondary | ICD-10-CM | POA: Diagnosis not present

## 2019-07-18 DIAGNOSIS — H43813 Vitreous degeneration, bilateral: Secondary | ICD-10-CM | POA: Diagnosis not present

## 2019-07-18 DIAGNOSIS — H35363 Drusen (degenerative) of macula, bilateral: Secondary | ICD-10-CM | POA: Diagnosis not present

## 2019-09-03 ENCOUNTER — Encounter (HOSPITAL_COMMUNITY): Payer: Self-pay

## 2019-09-03 ENCOUNTER — Emergency Department (HOSPITAL_COMMUNITY)
Admission: EM | Admit: 2019-09-03 | Discharge: 2019-09-03 | Disposition: A | Discharge status: Home / Self Care | Payer: Medicare Other | Attending: Emergency Medicine | Admitting: Emergency Medicine

## 2019-09-03 ENCOUNTER — Other Ambulatory Visit: Payer: Self-pay

## 2019-09-03 DIAGNOSIS — L03119 Cellulitis of unspecified part of limb: Secondary | ICD-10-CM | POA: Insufficient documentation

## 2019-09-03 DIAGNOSIS — L03115 Cellulitis of right lower limb: Secondary | ICD-10-CM | POA: Diagnosis not present

## 2019-09-03 DIAGNOSIS — M7989 Other specified soft tissue disorders: Secondary | ICD-10-CM | POA: Diagnosis present

## 2019-09-03 LAB — CBC WITH DIFFERENTIAL/PLATELET
Abs Immature Granulocytes: 0.01 K/uL (ref 0.00–0.07)
Basophils Absolute: 0 K/uL (ref 0.0–0.1)
Basophils Relative: 0 %
Eosinophils Absolute: 0.1 K/uL (ref 0.0–0.5)
Eosinophils Relative: 1 %
HCT: 37.6 % (ref 36.0–46.0)
Hemoglobin: 11.5 g/dL — ABNORMAL LOW (ref 12.0–15.0)
Immature Granulocytes: 0 %
Lymphocytes Relative: 14 %
Lymphs Abs: 0.9 K/uL (ref 0.7–4.0)
MCH: 29.6 pg (ref 26.0–34.0)
MCHC: 30.6 g/dL (ref 30.0–36.0)
MCV: 96.9 fL (ref 80.0–100.0)
Monocytes Absolute: 0.8 K/uL (ref 0.1–1.0)
Monocytes Relative: 12 %
Neutro Abs: 5 K/uL (ref 1.7–7.7)
Neutrophils Relative %: 73 %
Platelets: 249 K/uL (ref 150–400)
RBC: 3.88 MIL/uL (ref 3.87–5.11)
RDW: 13.1 % (ref 11.5–15.5)
WBC: 6.8 K/uL (ref 4.0–10.5)
nRBC: 0 % (ref 0.0–0.2)

## 2019-09-03 LAB — BASIC METABOLIC PANEL
Anion gap: 7 (ref 5–15)
BUN: 8 mg/dL (ref 8–23)
CO2: 32 mmol/L (ref 22–32)
Calcium: 9.3 mg/dL (ref 8.9–10.3)
Chloride: 98 mmol/L (ref 98–111)
Creatinine, Ser: 0.78 mg/dL (ref 0.44–1.00)
GFR calc Af Amer: >60 mL/min (ref >60)
GFR calc non Af Amer: >60 mL/min (ref >60)
Glucose, Bld: 90 mg/dL (ref 70–99)
Potassium: 3.9 mmol/L (ref 3.5–5.1)
Sodium: 137 mmol/L (ref 135–145)

## 2019-09-03 LAB — LACTIC ACID, PLASMA: Lactic Acid, Venous: 1.3 mmol/L (ref 0.5–1.9)

## 2019-09-03 MED ORDER — SODIUM CHLORIDE 0.9 % IV SOLN
1.0000 g | Freq: Once | INTRAVENOUS | Status: AC
  Administered 2019-09-03: 1 g via INTRAVENOUS
  Filled 2019-09-03: qty 10

## 2019-09-03 MED ORDER — CEPHALEXIN 250 MG PO CAPS: 250.0000 mg | ORAL_CAPSULE | Freq: Four times a day (QID) | ORAL | 0 refills | Status: DC

## 2019-09-03 MED ORDER — KETOCONAZOLE 2 % EX CREA: 1.0000 "application " | TOPICAL_CREAM | Freq: Every day | CUTANEOUS | 0 refills | Status: DC

## 2019-09-03 NOTE — ED Provider Notes (Signed)
[facility]-EMERGENCY DEPT Provider Note   CSN: [id number] Arrival date & time: 09/03/19  1700     History Chief Complaint  Patient presents with  . Cellulitis    Amber Rowland is a 86 y.o. female.  HPI   Patient presents to ED with complaints of leg swelling and redness.  Patient states she noticed dry scaly skin over the last couple of weeks.  She is also going scrubbing and washing of her legs to try to get immediate skin.  Patient started developing redness and swelling the last few days.  She denies any fevers but her legs are painful.  She has been recently compression scans of her legs.  Patient is worried about her circulation.  She thought she might need amputation .  Denies any history of diabetes.  No history of cellulitis.  Patient states she has not seen a doctor in a while.   Past Medical History:  Diagnosis Date  . Atrophy of left kidney    Chronic atrophy of the left kidney.  . Benign breast cysts in female 04/12/2012   Seen on mammography 2009   . Chronic constipation   . COPD (chronic obstructive pulmonary disease) (HCC)    Hyperinflation and heavy smoking history strongly  . Ejection fraction   . Esophageal dysmotility 02/14/2015  . Glaucoma (increased eye pressure)   . HTN (hypertension) 04/12/2012  . Ileus following gastrointestinal surgery (HCC) 04/28/2012  . Malnutrition (HCC) 04/28/2012  . Ovarian cyst, right 2009   s/p BSO 2009: OVARIAN FIBROTHECOMA in multiple fluid filled cysts  4.5cm region  . SBO (small bowel obstruction) (HCC)   . Severe malnutrition (HCC) 09/16/2013  . Stage III chronic kidney disease 02/13/2015  . UTI (lower urinary tract infection)    History of chronic recurrent UTIs    Patient Active Problem List   Diagnosis Date Noted  . Pressure ulcer 02/14/2015  . Esophageal dysmotility 02/14/2015  . Atherosclerosis of aorta With small ruptured plaque 02/14/2015  . Abdominal distention   . Bloating   .  Dysphagia   . Pulmonary nodule, right   . Left renal artery stenosis (HCC) 02/13/2015  . Dysphasia 02/13/2015  . Acute kidney injury (HCC) 02/13/2015  . Stage III chronic kidney disease 02/13/2015  . Pancreatic mass 02/13/2015  . Protein-calorie malnutrition, severe (HCC) 09/18/2013  . Murmur 09/17/2013  . Chest pain 09/16/2013  . Glaucoma 09/16/2013  . S/P laparotomy 06/10/2012  . Tobacco abuse 04/12/2012  . HTN (hypertension) 04/12/2012  . Benign breast cysts in female 04/12/2012  . Anxiety 04/12/2012  . Lung nodules, ?incidental 04/12/2012  . Chronic constipation   . COPD (chronic obstructive pulmonary disease) (HCC)     Past Surgical History:  Procedure Laterality Date  . BILATERAL OOPHORECTOMY  2009   PROCEDURE:  diagnostic laparoscopy, laparotomy with bilateral salpingo-  . LAPAROTOMY  04/23/2012   Procedure: EXPLORATORY LAPAROTOMY;  Surgeon: Valarie Merino, MD;  Location: [facility];  Service: General;  Laterality: N/A;  enterolysis      OB History   No obstetric history on file.     Family History  Problem Relation Age of Onset  . Diabetes Son   . High blood pressure Son   . Coronary artery disease Son     Social History   Tobacco Use  . Smoking status: Former Smoker    Packs/day: 0.80    Years: 60.00    Pack years: 48.00    Types: Cigarettes  Quit date: 03/20/2014    Years since quitting: 5.4  . Smokeless tobacco: Never Used  Substance Use Topics  . Alcohol use: No  . Drug use: No    Home Medications Prior to Admission medications   Medication Sig Start Date End Date Taking? Authorizing Provider  ALPHAGAN P 0.1 % SOLN Place 1 drop into both eyes 3 (three) times daily. 04/22/18   [provider]  amLODipine (NORVASC) 5 MG tablet Take 1 tablet (5 mg total) by mouth daily for 30 days. 08/26/18 09/25/18  Varney Biles, MD  aspirin 325 MG tablet Take 325 mg by mouth daily.    [provider]  cephALEXin (KEFLEX) 250 MG capsule Take 1  capsule (250 mg total) by mouth 4 (four) times daily. 09/03/19   Dorie Rank, MD  docusate sodium (COLACE) 100 MG capsule Take 1 capsule (100 mg total) by mouth every 12 (twelve) hours. 08/26/18   Varney Biles, MD  dorzolamide (TRUSOPT) 2 % ophthalmic solution Place 1 drop into both eyes 2 (two) times daily. 04/22/18   [provider]  ketoconazole (NIZORAL) 2 % cream Apply 1 application topically daily. 09/03/19   Dorie Rank, MD  Multiple Vitamins-Minerals (CENTRUM SILVER PO) Take 1 tablet by mouth daily.    [provider]  predniSONE (DELTASONE) 10 MG tablet Take 2 tablets (20 mg total) by mouth daily. 01/30/19   Davonna Belling, MD  sodium chloride (OCEAN) 0.65 % SOLN nasal spray Place 1 spray into both nostrils as needed for congestion. 05/22/18   McDonald, Mia A, PA-C  traMADol (ULTRAM) 50 MG tablet Take 1 tablet (50 mg total) by mouth every 12 (twelve) hours as needed. 01/30/19   Davonna Belling, MD    Allergies    Patient has no known allergies.  Review of Systems   Review of Systems  All other systems reviewed and are negative.   Physical Exam Updated Vital Signs BP (!) 151/91   Pulse 77   Temp 98.3 F (36.8 C) (Oral)   Resp 19   Ht 1.524 m (5')   Wt 37.2 kg   SpO2 99%   BMI 16.01 kg/m   Physical Exam Vitals and nursing note reviewed.  Constitutional:      Appearance: She is well-developed. She is not diaphoretic.     Comments: Elderly, frail  HENT:     Head: Normocephalic and atraumatic.     Right Ear: External ear normal.     Left Ear: External ear normal.  Eyes:     General: No scleral icterus.       Right eye: No discharge.        Left eye: No discharge.     Conjunctiva/sclera: Conjunctivae normal.  Neck:     Trachea: No tracheal deviation.  Cardiovascular:     Rate and Rhythm: Normal rate and regular rhythm.  Pulmonary:     Effort: Pulmonary effort is normal. No respiratory distress.     Breath sounds: Normal breath sounds. No stridor.  No wheezing or rales.  Abdominal:     General: Bowel sounds are normal. There is no distension.     Palpations: Abdomen is soft.     Tenderness: There is no abdominal tenderness. There is no guarding or rebound.  Musculoskeletal:        General: Tenderness present.     Cervical back: Neck supple.     Right lower leg: Edema present.     Left lower leg: Edema present.  Comments:  Pain edema bilateral lower extremities below the knees, tender to touch, no ulcerations, dry skin, palpable dorsalis pedis pulses bilaterally   Skin:    General: Skin is warm and dry.     Findings: No rash.  Neurological:     Mental Status: She is alert.     Cranial Nerves: No cranial nerve deficit (no facial droop, extraocular movements intact, no slurred speech).     Sensory: No sensory deficit.     Motor: No abnormal muscle tone or seizure activity.     Coordination: Coordination normal.     ED Results / Procedures / Treatments   Labs (all labs ordered are listed, but only abnormal results are displayed) Labs Reviewed  CBC WITH DIFFERENTIAL/PLATELET - Abnormal; Notable for the following components:      Result Value   Hemoglobin 11.5 (*)    All other components within normal limits  BASIC METABOLIC PANEL  LACTIC ACID, PLASMA    EKG None  Radiology No results found.  Procedures Procedures (including critical care time)  Medications Ordered in ED Medications  cefTRIAXone (ROCEPHIN) 1 g in sodium chloride 0.9 % 100 mL IVPB (0 g Intravenous Stopped 09/03/19 2023)    ED Course  I have reviewed the triage vital signs and the nursing notes.  Pertinent labs & imaging results that were available during my care of the patient were reviewed by me and considered in my medical decision making (see chart for details).  Clinical Course as of Sep 03 2131  Wynelle Link Sep 03, 2019  2114 Labs reviewed.  No significant abnormalities   [JK]    Clinical Course User Index [JK] Linwood Dibbles, MD   MDM  Rules/Calculators/A&P                      Patient presented to the ED for evaluation of leg swelling and redness.  Patient's exam is consistent with cellulitis.  She has good circulation and her swelling is confined to the area of erythema.  Presentation is not suggestive of DVT.  Patient does have scaling skin and she may have tinea pedis.  I suspect this may have precipitated the cellulitis.  Patient is nontoxic.  She does not have a white count.  She does not have a lactic acidosis.  I will discharge her home with a course of antibiotics and also recommend antifungal cream.  Outpatient follow-up with a primary care doctor. Final Clinical Impression(s) / ED Diagnoses Final diagnoses:  Cellulitis of lower extremity, unspecified laterality    Rx / DC Orders ED Discharge Orders         Ordered    cephALEXin (KEFLEX) 250 MG capsule  4 times daily     09/03/19 2132    ketoconazole (NIZORAL) 2 % cream  Daily     09/03/19 2132           Linwood Dibbles, MD 09/03/19 2134

## 2019-09-03 NOTE — ED Triage Notes (Signed)
PT to ED with c/o cellulitis to both lower legs. Pt states the legs started to get get red/flaky/hard/dry 3 weeks ago and progressively gotten worse. Pt states pain is 10/10.

## 2019-09-03 NOTE — ED Notes (Signed)
Pt ran to door and screamed for help.  Upon entering the pt's room, I noted blood on the floor and pt stated she pulled her IV out.  Pressure was applied and rebandanged area.  RN aware.

## 2019-09-03 NOTE — Discharge Instructions (Addendum)
Take the antibiotics as prescribed, try to keep your feel elevated.  Apply the antifungal cream to your dry skin on your feet.  Follow up with a primary care doctor to have your legs rechecked and make sure they are improving.  Return to the ED for fevers, worsening symptoms

## 2019-11-30 DIAGNOSIS — H401123 Primary open-angle glaucoma, left eye, severe stage: Secondary | ICD-10-CM | POA: Diagnosis not present

## 2019-11-30 DIAGNOSIS — H401112 Primary open-angle glaucoma, right eye, moderate stage: Secondary | ICD-10-CM | POA: Diagnosis not present

## 2019-12-16 ENCOUNTER — Other Ambulatory Visit: Payer: Self-pay

## 2019-12-16 ENCOUNTER — Emergency Department (HOSPITAL_COMMUNITY)
Admission: EM | Admit: 2019-12-16 | Discharge: 2019-12-16 | Disposition: A | Discharge status: Home / Self Care | Payer: Medicare Other | Attending: Emergency Medicine | Admitting: Emergency Medicine

## 2019-12-16 DIAGNOSIS — I872 Venous insufficiency (chronic) (peripheral): Secondary | ICD-10-CM | POA: Diagnosis not present

## 2019-12-16 DIAGNOSIS — Z87891 Personal history of nicotine dependence: Secondary | ICD-10-CM | POA: Insufficient documentation

## 2019-12-16 DIAGNOSIS — I129 Hypertensive chronic kidney disease with stage 1 through stage 4 chronic kidney disease, or unspecified chronic kidney disease: Secondary | ICD-10-CM | POA: Diagnosis not present

## 2019-12-16 DIAGNOSIS — L03119 Cellulitis of unspecified part of limb: Secondary | ICD-10-CM

## 2019-12-16 DIAGNOSIS — Z79899 Other long term (current) drug therapy: Secondary | ICD-10-CM | POA: Insufficient documentation

## 2019-12-16 DIAGNOSIS — I10 Essential (primary) hypertension: Secondary | ICD-10-CM

## 2019-12-16 DIAGNOSIS — Z760 Encounter for issue of repeat prescription: Secondary | ICD-10-CM

## 2019-12-16 DIAGNOSIS — I878 Other specified disorders of veins: Secondary | ICD-10-CM

## 2019-12-16 DIAGNOSIS — Z7982 Long term (current) use of aspirin: Secondary | ICD-10-CM | POA: Insufficient documentation

## 2019-12-16 DIAGNOSIS — Z7952 Long term (current) use of systemic steroids: Secondary | ICD-10-CM | POA: Diagnosis not present

## 2019-12-16 DIAGNOSIS — L03116 Cellulitis of left lower limb: Secondary | ICD-10-CM | POA: Diagnosis not present

## 2019-12-16 DIAGNOSIS — J449 Chronic obstructive pulmonary disease, unspecified: Secondary | ICD-10-CM | POA: Diagnosis not present

## 2019-12-16 DIAGNOSIS — L03115 Cellulitis of right lower limb: Secondary | ICD-10-CM | POA: Insufficient documentation

## 2019-12-16 DIAGNOSIS — N183 Chronic kidney disease, stage 3 unspecified: Secondary | ICD-10-CM | POA: Diagnosis not present

## 2019-12-16 DIAGNOSIS — B9689 Other specified bacterial agents as the cause of diseases classified elsewhere: Secondary | ICD-10-CM | POA: Diagnosis not present

## 2019-12-16 LAB — COMPREHENSIVE METABOLIC PANEL
ALT: 15 U/L (ref 0–44)
AST: 24 U/L (ref 15–41)
Albumin: 3.9 g/dL (ref 3.5–5.0)
Alkaline Phosphatase: 99 U/L (ref 38–126)
Anion gap: 8 (ref 5–15)
BUN: 11 mg/dL (ref 8–23)
CO2: 31 mmol/L (ref 22–32)
Calcium: 9.5 mg/dL (ref 8.9–10.3)
Chloride: 96 mmol/L — ABNORMAL LOW (ref 98–111)
Creatinine, Ser: 0.81 mg/dL (ref 0.44–1.00)
GFR calc Af Amer: >60 mL/min (ref >60)
GFR calc non Af Amer: >60 mL/min (ref >60)
Glucose, Bld: 89 mg/dL (ref 70–99)
Potassium: 4.4 mmol/L (ref 3.5–5.1)
Sodium: 135 mmol/L (ref 135–145)
Total Bilirubin: 0.8 mg/dL (ref 0.3–1.2)
Total Protein: 7.3 g/dL (ref 6.5–8.1)

## 2019-12-16 LAB — TROPONIN I (HIGH SENSITIVITY): Troponin I (High Sensitivity): 4 ng/L (ref <18)

## 2019-12-16 LAB — CBC
HCT: 36.9 % (ref 36.0–46.0)
Hemoglobin: 11.6 g/dL — ABNORMAL LOW (ref 12.0–15.0)
MCH: 29.1 pg (ref 26.0–34.0)
MCHC: 31.4 g/dL (ref 30.0–36.0)
MCV: 92.5 fL (ref 80.0–100.0)
Platelets: 190 10*3/uL (ref 150–400)
RBC: 3.99 MIL/uL (ref 3.87–5.11)
RDW: 13.4 % (ref 11.5–15.5)
WBC: 6.2 10*3/uL (ref 4.0–10.5)
nRBC: 0 % (ref 0.0–0.2)

## 2019-12-16 MED ORDER — CEPHALEXIN 500 MG PO CAPS: 1000.0000 mg | ORAL_CAPSULE | Freq: Two times a day (BID) | ORAL | 0 refills | Status: DC

## 2019-12-16 MED ORDER — AMLODIPINE BESYLATE 5 MG PO TABS: 5.0000 mg | ORAL_TABLET | Freq: Every day | ORAL | 2 refills | Status: DC

## 2019-12-16 MED ORDER — CEPHALEXIN 500 MG PO CAPS
1000.0000 mg | ORAL_CAPSULE | Freq: Once | ORAL | Status: AC
  Administered 2019-12-16: 1000 mg via ORAL
  Filled 2019-12-16: qty 2

## 2019-12-16 MED ORDER — TOLNAFTATE 1 % EX CREA: 1.0000 "application " | TOPICAL_CREAM | Freq: Two times a day (BID) | CUTANEOUS | 0 refills | Status: AC

## 2019-12-16 MED ORDER — FUROSEMIDE 20 MG PO TABS: 20.0000 mg | ORAL_TABLET | Freq: Every day | ORAL | 0 refills | Status: DC

## 2019-12-16 MED ORDER — AMLODIPINE BESYLATE 5 MG PO TABS
5.0000 mg | ORAL_TABLET | Freq: Once | ORAL | Status: AC
  Administered 2019-12-16: 5 mg via ORAL
  Filled 2019-12-16: qty 1

## 2019-12-16 NOTE — Discharge Instructions (Signed)
1.  Start taking the amlodipine (Norvasc) daily as prescribed in the morning.  Measure your blood pressures 2-3 times a day and keep a journal.  Your doctor will need these numbers to determine if you need an increased dose. 2.  Use Tinactin cream on your feet twice a day. 3.  Try to elevate your legs as much as possible to diminish swelling.  Take Keflex as prescribed twice a day. 4.  Return to the emergency department if you have concerning or worsening symptoms.

## 2019-12-16 NOTE — ED Triage Notes (Signed)
Patient states she needs a refill of her blood pressure medication. Patient states she has no pcp and hasn't had her medication in about a month. Patient reports numbness in upper extremities, blurred vision. Patient a/o, clear speech. Patient denies pain

## 2019-12-16 NOTE — ED Provider Notes (Signed)
[facility]-EMERGENCY DEPT Provider Note   CSN: [id number] Arrival date & time: 12/16/19  1530     History Chief Complaint  Patient presents with  . Medication Refill    A[facility] is a 86 y.o. female.  HPI Patient presents with multiple symptoms.  She reports that she ran out of her blood pressure medication and has not had it for a couple of months.  She was getting blood pressure medication prescribed through the emergency department.  Apparently, she has not had a family doctor.  She reports she is set up for an appointment in the third week of July.  She wants to know she can get a refill for her blood pressure medications.  She reports she was seen in her ophthalmologist for her glaucoma a couple of days ago and her systolic blood pressure was up to 190.  She reports her daughter or daughter-in-law has a blood pressure machine and check to the following day and her systolic pressure was up to 180.  Patient reports that she gets ringing in her ears and blurred vision.  She reports the vision seems more blurry than anything she experiences with her glaucoma.  No focal loss of vision.  She reports pressure-like frontal headache.  No confusion.  She also feels that she gets tingling and numbness in both arms and both legs.  She reports that the tingling and discomfort in her feet and legs seems worse when she wakes up in the morning.  She reports that walking around and exercising them seems to be somewhat helpful.  She denies cramping or pain in the legs with exertion.  She does get swelling of both feet and ankles.  She reports that its more red right now in her legs.  She reports the last time it looked red like that she came to the emergency department and got an antibiotic for infection.  She reports it helped for a while but now they are red again.  She is also been dealing with chronic fungal infection of the feet.  Patient reports she has arthritis all over  her body.  She reports is severe in her neck and her hands.  This is been a chronic problem that causes her pain.  No change from baseline.    Past Medical History:  Diagnosis Date  . Atrophy of left kidney    Chronic atrophy of the left kidney.  . Benign breast cysts in female 04/12/2012   Seen on mammography 2009   . Chronic constipation   . COPD (chronic obstructive pulmonary disease) (HCC)    Hyperinflation and heavy smoking history strongly  . Ejection fraction   . Esophageal dysmotility 02/14/2015  . Glaucoma (increased eye pressure)   . HTN (hypertension) 04/12/2012  . Ileus following gastrointestinal surgery (HCC) 04/28/2012  . Malnutrition (HCC) 04/28/2012  . Ovarian cyst, right 2009   s/p BSO 2009: OVARIAN FIBROTHECOMA in multiple fluid filled cysts  4.5cm region  . SBO (small bowel obstruction) (HCC)   . Severe malnutrition (HCC) 09/16/2013  . Stage III chronic kidney disease 02/13/2015  . UTI (lower urinary tract infection)    History of chronic recurrent UTIs    Patient Active Problem List   Diagnosis Date Noted  . Pressure ulcer 02/14/2015  . Esophageal dysmotility 02/14/2015  . Atherosclerosis of aorta With small ruptured plaque 02/14/2015  . Abdominal distention   . Bloating   . Dysphagia   . Pulmonary nodule, right   .  Left renal artery stenosis (HCC) 02/13/2015  . Dysphasia 02/13/2015  . Acute kidney injury (HCC) 02/13/2015  . Stage III chronic kidney disease 02/13/2015  . Pancreatic mass 02/13/2015  . Protein-calorie malnutrition, severe (HCC) 09/18/2013  . Murmur 09/17/2013  . Chest pain 09/16/2013  . Glaucoma 09/16/2013  . S/P laparotomy 06/10/2012  . Tobacco abuse 04/12/2012  . HTN (hypertension) 04/12/2012  . Benign breast cysts in female 04/12/2012  . Anxiety 04/12/2012  . Lung nodules, ?incidental 04/12/2012  . Chronic constipation   . COPD (chronic obstructive pulmonary disease) (HCC)     Past Surgical History:  Procedure Laterality Date    . BILATERAL OOPHORECTOMY  2009   PROCEDURE:  diagnostic laparoscopy, laparotomy with bilateral salpingo-  . LAPAROTOMY  04/23/2012   Procedure: EXPLORATORY LAPAROTOMY;  Surgeon: Valarie Merino, MD;  Location: [facility];  Service: General;  Laterality: N/A;  enterolysis      OB History   No obstetric history on file.     Family History  Problem Relation Age of Onset  . Diabetes Son   . High blood pressure Son   . Coronary artery disease Son     Social History   Tobacco Use  . Smoking status: Former Smoker    Packs/day: 0.80    Years: 60.00    Pack years: 48.00    Types: Cigarettes    Quit date: 03/20/2014    Years since quitting: 5.7  . Smokeless tobacco: Never Used  Vaping Use  . Vaping Use: Never used  Substance Use Topics  . Alcohol use: No  . Drug use: No    Home Medications Prior to Admission medications   Medication Sig Start Date End Date Taking? Authorizing Provider  acetaminophen (TYLENOL) 500 MG tablet Take 500 mg by mouth every 6 (six) hours as needed for moderate pain.   Yes [provider]  amLODipine (NORVASC) 5 MG tablet Take 5 mg by mouth daily.   Yes [provider]  aspirin EC 81 MG tablet Take 81-162 mg by mouth daily. Swallow whole.   Yes [provider]  brimonidine (ALPHAGAN) 0.15 % ophthalmic solution Place 1 drop into both eyes 2 (two) times daily.  12/11/19  Yes [provider]  Multiple Vitamins-Minerals (CENTRUM SILVER PO) Take 1 tablet by mouth daily.   Yes [provider]  amLODipine (NORVASC) 5 MG tablet Take 1 tablet (5 mg total) by mouth daily. 12/16/19 01/15/20  Arby Barrette, MD  cephALEXin (KEFLEX) 250 MG capsule Take 1 capsule (250 mg total) by mouth 4 (four) times daily. Patient not taking: Reported on 12/16/2019 09/03/19   Linwood Dibbles, MD  cephALEXin (KEFLEX) 500 MG capsule Take 2 capsules (1,000 mg total) by mouth 2 (two) times daily. 12/16/19   Arby Barrette, MD  docusate sodium (COLACE)  100 MG capsule Take 1 capsule (100 mg total) by mouth every 12 (twelve) hours. Patient not taking: Reported on 12/16/2019 08/26/18   Derwood Kaplan, MD  dorzolamide (TRUSOPT) 2 % ophthalmic solution Place 1 drop into both eyes 2 (two) times daily. Patient not taking: Reported on 12/16/2019 04/22/18   [provider]  furosemide (LASIX) 20 MG tablet Take 1 tablet (20 mg total) by mouth daily. 12/16/19   Arby Barrette, MD  ketoconazole (NIZORAL) 2 % cream Apply 1 application topically daily. Patient not taking: Reported on 12/16/2019 09/03/19   Linwood Dibbles, MD  predniSONE (DELTASONE) 10 MG tablet Take 2 tablets (20 mg total) by mouth daily. Patient not taking:  Reported on 12/16/2019 01/30/19   Benjiman Core, MD  sodium chloride (OCEAN) 0.65 % SOLN nasal spray Place 1 spray into both nostrils as needed for congestion. Patient not taking: Reported on 12/16/2019 05/22/18   McDonald, Pedro Earls A, PA-C  tolnaftate (TINACTIN) 1 % cream Apply 1 application topically 2 (two) times daily. Apply to feet twice daily 12/16/19   Arby Barrette, MD  traMADol (ULTRAM) 50 MG tablet Take 1 tablet (50 mg total) by mouth every 12 (twelve) hours as needed. Patient not taking: Reported on 12/16/2019 01/30/19   Benjiman Core, MD    Allergies    Patient has no known allergies.  Review of Systems   Review of Systems 10 systems reviewed and negative except as per HPI Physical Exam Updated Vital Signs BP (!) 154/66   Pulse (!) 57   Temp (!) 97.5 F (36.4 C) (Oral)   Resp 18   Ht 4\' 10"  (1.473 m)   Wt 36.3 kg   SpO2 98%   BMI 16.72 kg/m   Physical Exam Constitutional:      Comments: Alert and nontoxic.  No respiratory distress.  Patient is very thin.  HENT:     Head: Normocephalic and atraumatic.  Eyes:     Extraocular Movements: Extraocular movements intact.     Conjunctiva/sclera: Conjunctivae normal.  Cardiovascular:     Rate and Rhythm: Normal rate and regular rhythm.  Pulmonary:     Effort:  Pulmonary effort is normal.     Breath sounds: Normal breath sounds.     Comments: Patient has significant pectus excavatum.  Lungs are grossly clear.  Patient is not exhibiting any respiratory distress. Abdominal:     General: There is no distension.     Palpations: Abdomen is soft.     Tenderness: There is no abdominal tenderness. There is no guarding.  Musculoskeletal:     Comments: 1+ pitting edema bilateral feet ankles and lower legs.  Diffuse erythema of the pretibial surfaces and lower legs bilaterally.  Blanching.  Patient has extensive scaling and dryness of the lower legs feet soles.  Thickened, onychomycotic toenails.  Pulses obtained in dorsalis pedis and posterior tibial bilateral with hand-held Doppler.  Feet are warm and dry the touch.  Skin:    General: Skin is warm and dry.  Neurological:     General: No focal deficit present.     Mental Status: She is oriented to person, place, and time.     Cranial Nerves: No cranial nerve deficit.     Motor: No weakness.     Coordination: Coordination normal.  Psychiatric:        Mood and Affect: Mood normal.     ED Results / Procedures / Treatments   Labs (all labs ordered are listed, but only abnormal results are displayed) Labs Reviewed  COMPREHENSIVE METABOLIC PANEL - Abnormal; Notable for the following components:      Result Value   Chloride 96 (*)    All other components within normal limits  CBC - Abnormal; Notable for the following components:   Hemoglobin 11.6 (*)    All other components within normal limits  TROPONIN I (HIGH SENSITIVITY)  TROPONIN I (HIGH SENSITIVITY)    EKG EKG Interpretation  Date/Time:  Saturday December 16 2019 15:49:18 EDT Ventricular Rate:  65 PR Interval:    QRS Duration: 94 QT Interval:  420 QTC Calculation: 437 R Axis:   -27 Text Interpretation: Sinus rhythm no sig chnage from previous Confirmed by [id number] (  [id number]) on 12/16/2019 8:12:18 PM   Radiology No results  found.  Procedures Procedures (including critical care time)  Medications Ordered in ED Medications  amLODipine (NORVASC) tablet 5 mg (5 mg Oral Given 12/16/19 1807)  cephALEXin (KEFLEX) capsule 1,000 mg (1,000 mg Oral Given 12/16/19 1806)    ED Course  I have reviewed the triage vital signs and the nursing notes.  Pertinent labs & imaging results that were available during my care of the patient were reviewed by me and considered in my medical decision making (see chart for details).    MDM Rules/Calculators/A&P                          Patient presents as outlined above.  She does have multiple complaints, predominantly chronic in nature.  She has been out of any blood pressure medication.  She had been getting prescriptions through the emergency department but not established with PCP yet.  Basic labs obtained.  Patient's renal function is stable.  Will continue amlodipine as previously prescribed.  No indications to keep a journal of her blood pressures.  No signs of endorgan damage today.  Patient also reports swelling and redness in her feet.  This appears predominantly due to venous stasis.  She reports symptoms improved with some walking and stretching.  However she has increased redness recently.  Will opt to treat with Keflex and short course of Lasix.  Patient does not endorse claudication.  Pulses present with hand-held Doppler bilaterally.  Patient has extensive fungal changes of the nails and also likely the soles of the feet with dryness and scaling over the feet and ankles.  Will recommend Tinactin cream twice daily.  Patient is counseled at managing severe toenail fungus is difficult.  She is to discuss with PCP or podiatry to determine if appropriate course for patient could involve oral antifungals.  Patient is discharged in good condition with her family member at bedside. Final Clinical Impression(s) / ED Diagnoses Final diagnoses:  Medication refill  Essential hypertension   Cellulitis of lower extremity, unspecified laterality  Chronic venous stasis    Rx / DC Orders ED Discharge Orders         Ordered    amLODipine (NORVASC) 5 MG tablet  Daily     Discontinue  Reprint     12/16/19 1958    cephALEXin (KEFLEX) 500 MG capsule  2 times daily     Discontinue  Reprint     12/16/19 1958    tolnaftate (TINACTIN) 1 % cream  2 times daily     Discontinue  Reprint     12/16/19 1958    furosemide (LASIX) 20 MG tablet  Daily     Discontinue  Reprint     12/16/19 2009           Charlesetta Shanks, MD 12/16/19 2015

## 2019-12-24 ENCOUNTER — Emergency Department (HOSPITAL_COMMUNITY)
Admission: EM | Admit: 2019-12-24 | Discharge: 2019-12-24 | Disposition: A | Discharge status: Home / Self Care | Payer: Medicare Other | Attending: Emergency Medicine | Admitting: Emergency Medicine

## 2019-12-24 ENCOUNTER — Encounter (HOSPITAL_COMMUNITY): Payer: Self-pay | Admitting: *Deleted

## 2019-12-24 ENCOUNTER — Other Ambulatory Visit: Payer: Self-pay

## 2019-12-24 DIAGNOSIS — H6121 Impacted cerumen, right ear: Secondary | ICD-10-CM

## 2019-12-24 DIAGNOSIS — J449 Chronic obstructive pulmonary disease, unspecified: Secondary | ICD-10-CM | POA: Insufficient documentation

## 2019-12-24 DIAGNOSIS — Z7952 Long term (current) use of systemic steroids: Secondary | ICD-10-CM | POA: Diagnosis not present

## 2019-12-24 DIAGNOSIS — N183 Chronic kidney disease, stage 3 unspecified: Secondary | ICD-10-CM | POA: Insufficient documentation

## 2019-12-24 DIAGNOSIS — H9203 Otalgia, bilateral: Secondary | ICD-10-CM | POA: Diagnosis present

## 2019-12-24 DIAGNOSIS — H9313 Tinnitus, bilateral: Secondary | ICD-10-CM | POA: Insufficient documentation

## 2019-12-24 DIAGNOSIS — I129 Hypertensive chronic kidney disease with stage 1 through stage 4 chronic kidney disease, or unspecified chronic kidney disease: Secondary | ICD-10-CM | POA: Insufficient documentation

## 2019-12-24 DIAGNOSIS — Z7982 Long term (current) use of aspirin: Secondary | ICD-10-CM | POA: Diagnosis not present

## 2019-12-24 DIAGNOSIS — Z87891 Personal history of nicotine dependence: Secondary | ICD-10-CM | POA: Insufficient documentation

## 2019-12-24 LAB — CBC
HCT: 37.5 % (ref 36.0–46.0)
Hemoglobin: 11.8 g/dL — ABNORMAL LOW (ref 12.0–15.0)
MCH: 29.2 pg (ref 26.0–34.0)
MCHC: 31.5 g/dL (ref 30.0–36.0)
MCV: 92.8 fL (ref 80.0–100.0)
Platelets: 184 10*3/uL (ref 150–400)
RBC: 4.04 MIL/uL (ref 3.87–5.11)
RDW: 13.2 % (ref 11.5–15.5)
WBC: 4.6 10*3/uL (ref 4.0–10.5)
nRBC: 0 % (ref 0.0–0.2)

## 2019-12-24 LAB — BASIC METABOLIC PANEL
Anion gap: 8 (ref 5–15)
BUN: 10 mg/dL (ref 8–23)
CO2: 32 mmol/L (ref 22–32)
Calcium: 9.8 mg/dL (ref 8.9–10.3)
Chloride: 94 mmol/L — ABNORMAL LOW (ref 98–111)
Creatinine, Ser: 0.81 mg/dL (ref 0.44–1.00)
GFR calc Af Amer: >60 mL/min (ref >60)
GFR calc non Af Amer: >60 mL/min (ref >60)
Glucose, Bld: 92 mg/dL (ref 70–99)
Potassium: 4.2 mmol/L (ref 3.5–5.1)
Sodium: 134 mmol/L — ABNORMAL LOW (ref 135–145)

## 2019-12-24 NOTE — ED Provider Notes (Signed)
[facility]-EMER[facility]PT Provider Note   CSN: [id number] Arrival date & time: 12/24/19  1547     History Chief Complaint  Patient presents with  . Otalgia  . ringing in her ears    Amber Rowland is a 86 y.o. female who presents for bilateral tinnitus.  She has multiple complaints.  She has a history of RA, COPD, protein calorie deficiency.  She states that she noticed some pain in the right ear, she took a Tylenol which improved her pain.  She complains of 1 week of ringing in her ears states that it is "so loud and I just wanted to stop."  She states she is having difficulty sleeping because she cannot get the ring out of her ears.  She has had some intermittent headaches which she thinks is due to her glaucoma drops.  She describes it as frontal, aching and improved with Tylenol.  She denies jaw claudication or changes in vision.  HPI     Past Medical History:  Diagnosis Date  . Atrophy of left kidney    Chronic atrophy of the left kidney.  . Benign breast cysts in female 04/12/2012   Seen on mammography 2009   . Chronic constipation   . COPD (chronic obstructive pulmonary disease) (HCC)    Hyperinflation and heavy smoking history strongly  . Ejection fraction   . Esophageal dysmotility 02/14/2015  . Glaucoma (increased eye pressure)   . HTN (hypertension) 04/12/2012  . Ileus following gastrointestinal surgery (HCC) 04/28/2012  . Malnutrition (HCC) 04/28/2012  . Ovarian cyst, right 2009   s/p BSO 2009: OVARIAN FIBROTHECOMA in multiple fluid filled cysts  4.5cm region  . SBO (small bowel obstruction) (HCC)   . Severe malnutrition (HCC) 09/16/2013  . Stage III chronic kidney disease 02/13/2015  . UTI (lower urinary tract infection)    History of chronic recurrent UTIs    Patient Active Problem List   Diagnosis Date Noted  . Pressure ulcer 02/14/2015  . Esophageal dysmotility 02/14/2015  . Atherosclerosis of aorta With small ruptured plaque  02/14/2015  . Abdominal distention   . Bloating   . Dysphagia   . Pulmonary nodule, right   . Left renal artery stenosis (HCC) 02/13/2015  . Dysphasia 02/13/2015  . Acute kidney injury (HCC) 02/13/2015  . Stage III chronic kidney disease 02/13/2015  . Pancreatic mass 02/13/2015  . Protein-calorie malnutrition, severe (HCC) 09/18/2013  . Murmur 09/17/2013  . Chest pain 09/16/2013  . Glaucoma 09/16/2013  . S/P laparotomy 06/10/2012  . Tobacco abuse 04/12/2012  . HTN (hypertension) 04/12/2012  . Benign breast cysts in female 04/12/2012  . Anxiety 04/12/2012  . Lung nodules, ?incidental 04/12/2012  . Chronic constipation   . COPD (chronic obstructive pulmonary disease) (HCC)     Past Surgical History:  Procedure Laterality Date  . BILATERAL OOPHORECTOMY  2009   PROCEDURE:  diagnostic laparoscopy, laparotomy with bilateral salpingo-  . LAPAROTOMY  04/23/2012   Procedure: EXPLORATORY LAPAROTOMY;  Surgeon: Valarie Merino, MD;  Location: [facility];  Service: General;  Laterality: N/A;  enterolysis      OB History   No obstetric history on file.     Family History  Problem Relation Age of Onset  . Diabetes Son   . High blood pressure Son   . Coronary artery disease Son     Social History   Tobacco Use  . Smoking status: Former Smoker    Packs/day: 0.80    Years: 60.00  Pack years: 48.00    Types: Cigarettes    Quit date: 03/20/2014    Years since quitting: 5.7  . Smokeless tobacco: Never Used  Vaping Use  . Vaping Use: Never used  Substance Use Topics  . Alcohol use: No  . Drug use: No    Home Medications Prior to Admission medications   Medication Sig Start Date End Date Taking? Authorizing Provider  acetaminophen (TYLENOL) 500 MG tablet Take 500 mg by mouth every 6 (six) hours as needed for moderate pain.   Yes [provider]  amLODipine (NORVASC) 5 MG tablet Take 1 tablet (5 mg total) by mouth daily. 12/16/19 01/15/20 Yes Arby Barrette, MD    aspirin EC 81 MG tablet Take 81-162 mg by mouth daily. Swallow whole.   Yes [provider]  brimonidine (ALPHAGAN) 0.15 % ophthalmic solution Place 1 drop into both eyes 2 (two) times daily.  12/11/19  Yes [provider]  furosemide (LASIX) 20 MG tablet Take 1 tablet (20 mg total) by mouth daily. Patient taking differently: Take 20 mg by mouth daily as needed for fluid or edema.  12/16/19  Yes Arby Barrette, MD  Multiple Vitamins-Minerals (CENTRUM SILVER PO) Take 1 tablet by mouth daily.   Yes [provider]  tolnaftate (TINACTIN) 1 % cream Apply 1 application topically 2 (two) times daily. Apply to feet twice daily 12/16/19  Yes Pfeiffer, Lebron Conners, MD  amLODipine (NORVASC) 5 MG tablet Take 5 mg by mouth daily. Patient not taking: Reported on 12/24/2019    [provider]  cephALEXin (KEFLEX) 250 MG capsule Take 1 capsule (250 mg total) by mouth 4 (four) times daily. Patient not taking: Reported on 12/16/2019 09/03/19   Linwood Dibbles, MD  cephALEXin (KEFLEX) 500 MG capsule Take 2 capsules (1,000 mg total) by mouth 2 (two) times daily. Patient not taking: Reported on 12/24/2019 12/16/19   Arby Barrette, MD  docusate sodium (COLACE) 100 MG capsule Take 1 capsule (100 mg total) by mouth every 12 (twelve) hours. Patient not taking: Reported on 12/16/2019 08/26/18   Derwood Kaplan, MD  dorzolamide (TRUSOPT) 2 % ophthalmic solution Place 1 drop into both eyes 2 (two) times daily. Patient not taking: Reported on 12/16/2019 04/22/18   [provider]  ketoconazole (NIZORAL) 2 % cream Apply 1 application topically daily. Patient not taking: Reported on 12/16/2019 09/03/19   Linwood Dibbles, MD  predniSONE (DELTASONE) 10 MG tablet Take 2 tablets (20 mg total) by mouth daily. Patient not taking: Reported on 12/16/2019 01/30/19   Benjiman Core, MD  sodium chloride (OCEAN) 0.65 % SOLN nasal spray Place 1 spray into both nostrils as needed for congestion. Patient not taking:  Reported on 12/16/2019 05/22/18   McDonald, Pedro Earls A, PA-C  traMADol (ULTRAM) 50 MG tablet Take 1 tablet (50 mg total) by mouth every 12 (twelve) hours as needed. Patient not taking: Reported on 12/16/2019 01/30/19   Benjiman Core, MD    Allergies    Patient has no known allergies.  Review of Systems   Review of Systems Ten systems reviewed and are negative for acute change, except as noted in the HPI.   Physical Exam Updated Vital Signs BP (!) 149/86 (BP Location: Left Arm)   Pulse 64   Temp 98.6 F (37 C) (Oral)   Resp 18   Ht 4\' 10"  (1.473 m)   Wt 36.3 kg   SpO2 100%   BMI 16.72 kg/m   Physical Exam Vitals and nursing note reviewed.  Constitutional:  General: She is not in acute distress.    Appearance: She is well-developed. She is not diaphoretic.  HENT:     Head: Normocephalic and atraumatic.     Right Ear: There is impacted cerumen.     Left Ear: Tympanic membrane normal.  Eyes:     General: No scleral icterus.    Conjunctiva/sclera: Conjunctivae normal.  Cardiovascular:     Rate and Rhythm: Normal rate and regular rhythm.     Heart sounds: Normal heart sounds. No murmur heard.  No friction rub. No gallop.   Pulmonary:     Effort: Pulmonary effort is normal. No respiratory distress.     Breath sounds: Normal breath sounds.  Abdominal:     General: Bowel sounds are normal. There is no distension.     Palpations: Abdomen is soft. There is no mass.     Tenderness: There is no abdominal tenderness. There is no guarding.  Musculoskeletal:     Cervical back: Normal range of motion.  Skin:    General: Skin is warm and dry.  Neurological:     Mental Status: She is alert and oriented to person, place, and time.  Psychiatric:        Behavior: Behavior normal.     ED Results / Procedures / Treatments   Labs (all labs ordered are listed, but only abnormal results are displayed) Labs Reviewed  BASIC METABOLIC PANEL - Abnormal; Notable for the following  components:      Result Value   Sodium 134 (*)    Chloride 94 (*)    All other components within normal limits  CBC - Abnormal; Notable for the following components:   Hemoglobin 11.8 (*)    All other components within normal limits    EKG None  Radiology No results found.  Procedures Procedures (including critical care time)  Medications Ordered in ED Medications - No data to display  ED Course  I have reviewed the triage vital signs and the nursing notes.  Pertinent labs & imaging results that were available during my care of the patient were reviewed by me and considered in my medical decision making (see chart for details).    MDM Rules/Calculators/A&P                          Patient here with tinnitus Cerumen impaction left ear removed by nursing staff. Labs w/out acute abnormality. No ataxia, vertigo or imbalance. F/u with ENT. Patient seen in shared visit with attending physician. Who agrees with assessment, work up , treatment, and plan for discharge.   Final Clinical Impression(s) / ED Diagnoses Final diagnoses:  Tinnitus of both ears  Impacted cerumen of right ear    Rx / DC Orders ED Discharge Orders    None       Arthor Captain, PA-C 12/25/19 1049    Rolan Bucco, MD 12/25/19 1502

## 2019-12-24 NOTE — ED Triage Notes (Signed)
Pt states she has ringing in her ears and rt ear ache. States she was given medicine which has not help.

## 2019-12-24 NOTE — Discharge Instructions (Signed)
Get help right away if: You develop tinnitus after a head injury. You have tinnitus along with any of the following: Dizziness. Loss of balance. Nausea and vomiting. Sudden, severe headache.

## 2020-01-10 DIAGNOSIS — H35363 Drusen (degenerative) of macula, bilateral: Secondary | ICD-10-CM | POA: Diagnosis not present

## 2020-01-10 DIAGNOSIS — H3540 Unspecified peripheral retinal degeneration: Secondary | ICD-10-CM | POA: Diagnosis not present

## 2020-01-10 DIAGNOSIS — H43813 Vitreous degeneration, bilateral: Secondary | ICD-10-CM | POA: Diagnosis not present

## 2020-01-10 DIAGNOSIS — H43393 Other vitreous opacities, bilateral: Secondary | ICD-10-CM | POA: Diagnosis not present

## 2020-01-11 ENCOUNTER — Other Ambulatory Visit: Payer: Self-pay

## 2020-01-11 ENCOUNTER — Ambulatory Visit: Payer: Medicare Other | Attending: Internal Medicine | Admitting: Internal Medicine

## 2020-01-11 ENCOUNTER — Encounter: Payer: Self-pay | Admitting: Internal Medicine

## 2020-01-11 VITALS — BP 171/90 | HR 61 | Resp 16 | Ht <= 58 in | Wt 76.6 lb

## 2020-01-11 DIAGNOSIS — Z8249 Family history of ischemic heart disease and other diseases of the circulatory system: Secondary | ICD-10-CM | POA: Insufficient documentation

## 2020-01-11 DIAGNOSIS — Z90722 Acquired absence of ovaries, bilateral: Secondary | ICD-10-CM | POA: Diagnosis not present

## 2020-01-11 DIAGNOSIS — K5909 Other constipation: Secondary | ICD-10-CM | POA: Diagnosis not present

## 2020-01-11 DIAGNOSIS — Z681 Body mass index (BMI) 19 or less, adult: Secondary | ICD-10-CM | POA: Diagnosis not present

## 2020-01-11 DIAGNOSIS — F419 Anxiety disorder, unspecified: Secondary | ICD-10-CM | POA: Insufficient documentation

## 2020-01-11 DIAGNOSIS — K224 Dyskinesia of esophagus: Secondary | ICD-10-CM | POA: Diagnosis not present

## 2020-01-11 DIAGNOSIS — Z87891 Personal history of nicotine dependence: Secondary | ICD-10-CM | POA: Insufficient documentation

## 2020-01-11 DIAGNOSIS — H409 Unspecified glaucoma: Secondary | ICD-10-CM | POA: Insufficient documentation

## 2020-01-11 DIAGNOSIS — J449 Chronic obstructive pulmonary disease, unspecified: Secondary | ICD-10-CM | POA: Insufficient documentation

## 2020-01-11 DIAGNOSIS — R079 Chest pain, unspecified: Secondary | ICD-10-CM | POA: Insufficient documentation

## 2020-01-11 DIAGNOSIS — I10 Essential (primary) hypertension: Secondary | ICD-10-CM

## 2020-01-11 DIAGNOSIS — M419 Scoliosis, unspecified: Secondary | ICD-10-CM | POA: Diagnosis not present

## 2020-01-11 DIAGNOSIS — Z7689 Persons encountering health services in other specified circumstances: Secondary | ICD-10-CM | POA: Diagnosis not present

## 2020-01-11 DIAGNOSIS — I129 Hypertensive chronic kidney disease with stage 1 through stage 4 chronic kidney disease, or unspecified chronic kidney disease: Secondary | ICD-10-CM | POA: Diagnosis not present

## 2020-01-11 DIAGNOSIS — Z79899 Other long term (current) drug therapy: Secondary | ICD-10-CM | POA: Insufficient documentation

## 2020-01-11 DIAGNOSIS — I701 Atherosclerosis of renal artery: Secondary | ICD-10-CM | POA: Diagnosis not present

## 2020-01-11 DIAGNOSIS — E440 Moderate protein-calorie malnutrition: Secondary | ICD-10-CM | POA: Insufficient documentation

## 2020-01-11 DIAGNOSIS — M159 Polyosteoarthritis, unspecified: Secondary | ICD-10-CM | POA: Insufficient documentation

## 2020-01-11 DIAGNOSIS — Z9181 History of falling: Secondary | ICD-10-CM | POA: Insufficient documentation

## 2020-01-11 DIAGNOSIS — M8949 Other hypertrophic osteoarthropathy, multiple sites: Secondary | ICD-10-CM

## 2020-01-11 DIAGNOSIS — Z7982 Long term (current) use of aspirin: Secondary | ICD-10-CM | POA: Insufficient documentation

## 2020-01-11 DIAGNOSIS — N183 Chronic kidney disease, stage 3 unspecified: Secondary | ICD-10-CM | POA: Diagnosis not present

## 2020-01-11 DIAGNOSIS — I700 Atherosclerosis of aorta: Secondary | ICD-10-CM | POA: Diagnosis not present

## 2020-01-11 DIAGNOSIS — N3941 Urge incontinence: Secondary | ICD-10-CM | POA: Diagnosis not present

## 2020-01-11 MED ORDER — BUDESONIDE-FORMOTEROL FUMARATE 80-4.5 MCG/ACT IN AERO: 2.0000 | INHALATION_SPRAY | Freq: Two times a day (BID) | RESPIRATORY_TRACT | 3 refills | Status: DC

## 2020-01-11 MED ORDER — TRAMADOL HCL 50 MG PO TABS: 25.0000 mg | ORAL_TABLET | Freq: Two times a day (BID) | ORAL | 0 refills | Status: DC | PRN

## 2020-01-11 MED ORDER — AMLODIPINE BESYLATE 5 MG PO TABS: 7.5000 mg | ORAL_TABLET | Freq: Every day | ORAL | 2 refills | Status: DC

## 2020-01-11 NOTE — Patient Instructions (Signed)
You can should consider having some home physical therapy and a home health aide.  Consider getting Meals on Wheels.  Please buy fresh fruits to supplement your meals.  You should also purchase some Ensure or boost shakes to supplement meals.  Consider frozen meals.  We have started you on a medication called tramadol to take twice a day as needed to help with your arthritis pains.  Let me know if the medication causes too much drowsiness for you.  Increase Norvasc to 1-1/2 tablets daily.  I have prescribed an inhaler called Symbicort for you to use twice a day.  We will consider referral to cardiology to evaluate your chest pains.

## 2020-01-11 NOTE — Progress Notes (Signed)
Patient ID: Amber Rowland, female    DOB: 10/13/1933  MRN: [id number]  CC: New Patient (Initial Visit)   Subjective: Amber Rowland is a 86 y.o. female who presents for new pt visit. Daughter Amber Rowland, is with her. Her concerns today include:  Patient with history of HTN, left RAS, atherosclerotic aortic small plaque rupture, COPD, esophageal dysmotility, tobacco dependence, anxiety, CKD 3, pulmonary nodules, arthritis, glaucoma  No previous PCP.  Patient has been the emergency room to get refills on medications  HTN:  Has a device but does not fit arm Compliant with Norvasc.  Took this a.m Limits salt as much as she can Gets dizzy very easily.   Gets intermittent tightness in chest for sometime.  Not sure if from COPD or her heart.   COPD:  Hx x 5 yrs.  Quit smoking 4-5 yrs ago.  Used to smoke 1 pk a day since age 19 yrs old.  C/o SOB with tightness at times.  Currently not on any inh.  Did not tolerate Advair.  No cough.  + wheezing at times.  Stays in house in front of fan during the day.  Can not tolerate humid weather.  She had CT angio of the chest in 2016.  This revealed to nodules in the right lungs the largest of which was 5 mm.  Radiologist had recommended follow-up CT in 1 year but this was not done.  Arthritis: c/o pain from arthritis. Spine, neck, shoulders. Hurts to walk.  Sits and walks leaning forward.  Hands and arms get numb LT>RT.  No numbness in legs.  Told she has pinch nerve in neck from ER. +Pain in hands.  Can not open jars, write or hold objects.  No pain in legs and hips.  Takes ASA and Tylenol but do not help much. Takes 2 low dose ASA and Tylenol 500 mg daily.  Reports having 2-3 falls this yr. she has a walker which she uses but does not have with her today because her daughter is with her.  Son stays with him sometimes but he is currently staying with a friend in [place].  Does not have Life Alert. -independent in feeding self.  Takes sponge baths;  afraid to get in tub.  Holds on to tub to get off toilet.  Declines prescription for toilet seat extender today.  She tries to do her own cleaning but can not do much.  Eats mainly can foods.  Can not cook.  On Alphagan for glaucoma  Past medical, social, surgical histories reviewed and updated. Patient Active Problem List   Diagnosis Date Noted  . Pressure ulcer 02/14/2015  . Esophageal dysmotility 02/14/2015  . Atherosclerosis of aorta With small ruptured plaque 02/14/2015  . Abdominal distention   . Bloating   . Dysphagia   . Pulmonary nodule, right   . Left renal artery stenosis (HCC) 02/13/2015  . Dysphasia 02/13/2015  . Acute kidney injury (HCC) 02/13/2015  . Stage III chronic kidney disease 02/13/2015  . Pancreatic mass 02/13/2015  . Protein-calorie malnutrition, severe (HCC) 09/18/2013  . Murmur 09/17/2013  . Chest pain 09/16/2013  . Glaucoma 09/16/2013  . S/P laparotomy 06/10/2012  . Tobacco abuse 04/12/2012  . HTN (hypertension) 04/12/2012  . Benign breast cysts in female 04/12/2012  . Anxiety 04/12/2012  . Lung nodules, ?incidental 04/12/2012  . Chronic constipation   . COPD (chronic obstructive pulmonary disease) (HCC)      Current Outpatient Medications on File Prior to Visit  Medication Sig Dispense Refill  . acetaminophen (TYLENOL) 500 MG tablet Take 500 mg by mouth every 6 (six) hours as needed for moderate pain.    Marland Kitchen. aspirin EC 81 MG tablet Take 81-162 mg by mouth daily. Swallow whole.    . brimonidine (ALPHAGAN) 0.15 % ophthalmic solution Place 1 drop into both eyes 2 (two) times daily.     . Multiple Vitamins-Minerals (CENTRUM SILVER PO) Take 1 tablet by mouth daily.    Marland Kitchen. tolnaftate (TINACTIN) 1 % cream Apply 1 application topically 2 (two) times daily. Apply to feet twice daily 30 g 0   No current facility-administered medications on file prior to visit.    No Known Allergies  Social History   Socioeconomic History  . Marital status: Widowed     Spouse name: Not on file  . Number of children: Not on file  . Years of education: Not on file  . Highest education level: Not on file  Occupational History  . Not on file  Tobacco Use  . Smoking status: Former Smoker    Packs/day: 0.80    Years: 60.00    Pack years: 48.00    Types: Cigarettes    Quit date: 03/20/2014    Years since quitting: 5.8  . Smokeless tobacco: Never Used  Vaping Use  . Vaping Use: Never used  Substance and Sexual Activity  . Alcohol use: No  . Drug use: No  . Sexual activity: Never  Other Topics Concern  . Not on file  Social History Narrative  . Not on file   Social Determinants of Health   Financial Resource Strain:   . Difficulty of Paying Living Expenses:   Food Insecurity:   . Worried About Programme researcher, broadcasting/film/videounning Out of Food in the Last Year:   . Baristaan Out of Food in the Last Year:   Transportation Needs:   . Freight forwarderLack of Transportation (Medical):   Marland Kitchen. Lack of Transportation (Non-Medical):   Physical Activity:   . Days of Exercise per Week:   . Minutes of Exercise per Session:   Stress:   . Feeling of Stress :   Social Connections:   . Frequency of Communication with Friends and Family:   . Frequency of Social Gatherings with Friends and Family:   . Attends Religious Services:   . Active Member of Clubs or Organizations:   . Attends BankerClub or Organization Meetings:   Marland Kitchen. Marital Status:   Intimate Partner Violence:   . Fear of Current or Ex-Partner:   . Emotionally Abused:   Marland Kitchen. Physically Abused:   . Sexually Abused:     Family History  Problem Relation Age of Onset  . Diabetes Son   . High blood pressure Son   . Coronary artery disease Son     Past Surgical History:  Procedure Laterality Date  . BILATERAL OOPHORECTOMY  2009   PROCEDURE:  diagnostic laparoscopy, laparotomy with bilateral salpingo-  . LAPAROTOMY  04/23/2012   Procedure: EXPLORATORY LAPAROTOMY;  Surgeon: Valarie MerinoMatthew B Martin, MD;  Location: [facility];  Service: General;  Laterality: N/A;   enterolysis     ROS: Review of Systems  Gastrointestinal: Positive for constipation. Negative for blood in stool.       Reports having an abdominal hernia.  Had history of bowel obstruction in the past.  Genitourinary:       Some leakage of urine if she is unable to get to the restroom in time.    PHYSICAL EXAM: BP (!) 171/90  Pulse 61   Resp 16   Ht 4\' 8"  (1.422 m)   Wt 76 lb 9.6 oz (34.7 kg)   SpO2 97%   BMI 17.17 kg/m   Wt Readings from Last 3 Encounters:  01/11/20 76 lb 9.6 oz (34.7 kg)  12/24/19 80 lb (36.3 kg)  12/16/19 80 lb (36.3 kg)    Physical Exam  General appearance -frail elderly Caucasian female in NAD.  Patient appears cachectic with wasting in the supraclavicular area and arm muscles.  Rib cage is easily seen due to loss of muscle mental status -patient is alert and oriented.  She answers questions appropriately. Eyes -Pink conjunctiva.  Extraocular movements intact. Nose - normal and patent, no erythema, discharge or polyps Mouth -edentulous above Neck -no neck masses palpated.  No thyromegaly.  Lymphatics -no cervical or axillary lymphadenopathy.   Breasts: No palpable masses Chest -slightly decreased bilaterally but without wheezes or crackles.  Pectus excavatum Heart -regular rate rhythm  abdomen -normal bowel sounds.   Neurological -gait is somewhat of a stooped posture with flexion of the upper trunk and the neck towards the floor.  Gait is slowed with poor foot to floor clearance. musculoskeletal -significant muscle wasting all over the body.significant deformity and enlargement of the PIP and DIP joints of both hands.  Good range of motion of both knees.  Mild to moderate discomfort with passive range of motion of the shoulders.  Significant scoliosis of the mid to upper thoracic spine and cervical spine extremities -no lower extremity edema.  CMP Latest Ref Rng & Units 12/24/2019 12/16/2019 09/03/2019  Glucose 70 - 99 mg/dL 92 89 90  BUN 8 - 23  mg/dL 10 11 8   Creatinine 0.44 - 1.00 mg/dL 09/05/2019 2.70  Sodium 135 - 145 mmol/L 134(L) 135 137  Potassium 3.5 - 5.1 mmol/L 4.2 4.4 3.9  Chloride 98 - 111 mmol/L 94(L) 96(L) 98  CO2 22 - 32 mmol/L 32 31 32  Calcium 8.9 - 10.3 mg/dL 9.8 9.5 9.3  Total Protein 6.5 - 8.1 g/dL - 7.3 -  Total Bilirubin 0.3 - 1.2 mg/dL - 0.8 -  Alkaline Phos 38 - 126 U/L - 99 -  AST 15 - 41 U/L - 24 -  ALT 0 - 44 U/L - 15 -   Lipid Panel     Component Value Date/Time   CHOL 184 02/11/2015 1559   TRIG 70 02/11/2015 1559   HDL 62 02/11/2015 1559   CHOLHDL 3.0 02/11/2015 1559   VLDL 14 02/11/2015 1559   LDLCALC 108 (H) 02/11/2015 1559    CBC    Component Value Date/Time   WBC 4.6 12/24/2019 1830   RBC 4.04 12/24/2019 1830   HGB 11.8 (L) 12/24/2019 1830   HCT 37.5 12/24/2019 1830   PLT 184 12/24/2019 1830   MCV 92.8 12/24/2019 1830   MCH 29.2 12/24/2019 1830   MCHC 31.5 12/24/2019 1830   RDW 13.2 12/24/2019 1830   LYMPHSABS 0.9 09/03/2019 1955   MONOABS 0.8 09/03/2019 1955   EOSABS 0.1 09/03/2019 1955   BASOSABS 0.0 09/03/2019 1955    ASSESSMENT AND PLAN: 1. Encounter to establish care -Given her significant arthritis, scoliosis and COPD, I recommend PCS services but patient declines stating she does not want strangers in her house.  She states she is also a bit embarrassed because her houses not as clean as she would like it to be.  Advised her to consider getting life alert. 2. Essential hypertension Not at goal.  Our goal is to get systolic blood pressure at least 150 or lower.  We will increase amlodipine slightly from 5 mg to 7.5 mg daily.  DASH diet discussed and encouraged. - amLODipine (NORVASC) 5 MG tablet; Take 1.5 tablets (7.5 mg total) by mouth daily.  Dispense: 45 tablet; Refill: 2  3. Chronic obstructive pulmonary disease, unspecified COPD type (HCC) We will start her on Symbicort.  Encouraged her to remain tobacco free - budesonide-formoterol (SYMBICORT) 80-4.5 MCG/ACT  inhaler; Inhale 2 puffs into the lungs 2 (two) times daily.  Dispense: 1 Inhaler; Refill: 3  4. Primary osteoarthritis involving multiple joints Since she reports no improvement with aspirin and Tylenol, we will try her with a low-dose of tramadol.  Advised patient that this is a narcotic medication and can cause drowsiness.  We will have her take 50 mg half a tablet twice a day as needed.  I have given her a limited prescription and will reassess her response on follow-up visit.  Patient declines seat extender for her toilet. - traMADol (ULTRAM) 50 MG tablet; Take 0.5 tablets (25 mg total) by mouth every 12 (twelve) hours as needed.  Dispense: 30 tablet; Refill: 0  5. Scoliosis of cervicothoracic spine, unspecified scoliosis type See #4 above.  Given her age and poor nutrition, I doubt she would be a surgical candidate  6. Chronic constipation Encourage eating green leafy vegetables.  Advised daughter to purchase MiraLAX over-the-counter and she can use it as needed.  7. Urge incontinence of urine   8. History of falling Discussed getting her some home physical therapy but patient declines stating that she does not like having people in her house.  9. Moderate protein-calorie malnutrition (HCC) Discussed and encourage better nutrition.  Advised against canned foods which tend to be high in salt content.  Other alternatives include frozen dinners, Meals on Wheels and substituting meals with boost or Ensure shakes.  10. Chest pain in adult 11. Atherosclerosis of aorta (HCC) Can continue aspirin for now.  Will consider referral to cardiology on next visit    Patient was given the opportunity to ask questions.  Patient verbalized understanding of the plan and was able to repeat key elements of the plan.   No orders of the defined types were placed in this encounter.    Requested Prescriptions   Signed Prescriptions Disp Refills  . budesonide-formoterol (SYMBICORT) 80-4.5 MCG/ACT  inhaler 1 Inhaler 3    Sig: Inhale 2 puffs into the lungs 2 (two) times daily.  . traMADol (ULTRAM) 50 MG tablet 30 tablet 0    Sig: Take 0.5 tablets (25 mg total) by mouth every 12 (twelve) hours as needed.  Marland Kitchen amLODipine (NORVASC) 5 MG tablet 45 tablet 2    Sig: Take 1.5 tablets (7.5 mg total) by mouth daily.    Return in about 4 weeks (around 02/08/2020).  Jonah Blue, MD, FACP

## 2020-01-30 DIAGNOSIS — Z23 Encounter for immunization: Secondary | ICD-10-CM | POA: Diagnosis not present

## 2020-02-15 ENCOUNTER — Ambulatory Visit: Payer: Medicare Other | Admitting: Internal Medicine

## 2020-02-20 DIAGNOSIS — Z23 Encounter for immunization: Secondary | ICD-10-CM | POA: Diagnosis not present

## 2020-02-21 MED FILL — AMLODIPINE BESYLATE 5 MG TA: 5 | 90 days supply | Qty: 135 | Fill #0

## 2020-02-23 ENCOUNTER — Telehealth: Payer: Self-pay | Admitting: Internal Medicine

## 2020-02-23 NOTE — Telephone Encounter (Signed)
Copied from CRM [id number]. Topic: General - Other >> Feb 23, 2020  2:39 PM Mcneil, Ja-Kwan wrote: Reason for CRM: Pt daughter Elease Hashimoto stated pt would like to switch from budesonide-formoterol (SYMBICORT) 80-4.5 MCG/ACT inhaler back to the albuterol (PROVENTIL HFA;VENTOLIN HFA) 108 (90 BASE) MCG/ACT inhaler due to complaints of it burning pt chest. Elease Hashimoto requests Rx to be sent to [id number] - [place], [place] - [address]

## 2020-02-27 MED ORDER — ALBUTEROL SULFATE HFA 108 (90 BASE) MCG/ACT IN AERS: 2.0000 | INHALATION_SPRAY | Freq: Four times a day (QID) | RESPIRATORY_TRACT | 5 refills | Status: AC | PRN

## 2020-02-27 NOTE — Telephone Encounter (Signed)
Will forward to pcp

## 2020-02-27 NOTE — Telephone Encounter (Signed)
forward

## 2020-03-17 ENCOUNTER — Encounter (HOSPITAL_COMMUNITY): Payer: Self-pay

## 2020-03-17 ENCOUNTER — Emergency Department (HOSPITAL_COMMUNITY): Payer: Medicare Other

## 2020-03-17 ENCOUNTER — Other Ambulatory Visit: Payer: Self-pay

## 2020-03-17 DIAGNOSIS — R0602 Shortness of breath: Secondary | ICD-10-CM | POA: Diagnosis not present

## 2020-03-17 DIAGNOSIS — R0789 Other chest pain: Secondary | ICD-10-CM | POA: Diagnosis not present

## 2020-03-17 DIAGNOSIS — N183 Chronic kidney disease, stage 3 unspecified: Secondary | ICD-10-CM | POA: Insufficient documentation

## 2020-03-17 DIAGNOSIS — I129 Hypertensive chronic kidney disease with stage 1 through stage 4 chronic kidney disease, or unspecified chronic kidney disease: Secondary | ICD-10-CM | POA: Insufficient documentation

## 2020-03-17 DIAGNOSIS — J449 Chronic obstructive pulmonary disease, unspecified: Secondary | ICD-10-CM | POA: Insufficient documentation

## 2020-03-17 DIAGNOSIS — I517 Cardiomegaly: Secondary | ICD-10-CM | POA: Diagnosis not present

## 2020-03-17 DIAGNOSIS — Z87891 Personal history of nicotine dependence: Secondary | ICD-10-CM | POA: Diagnosis not present

## 2020-03-17 DIAGNOSIS — R079 Chest pain, unspecified: Secondary | ICD-10-CM | POA: Diagnosis not present

## 2020-03-17 LAB — BASIC METABOLIC PANEL
Anion gap: 7 (ref 5–15)
BUN: 18 mg/dL (ref 8–23)
CO2: 31 mmol/L (ref 22–32)
Calcium: 9.9 mg/dL (ref 8.9–10.3)
Chloride: 102 mmol/L (ref 98–111)
Creatinine, Ser: 0.99 mg/dL (ref 0.44–1.00)
GFR calc Af Amer: >60 mL/min (ref >60)
GFR calc non Af Amer: 52 mL/min — ABNORMAL LOW (ref >60)
Glucose, Bld: 101 mg/dL — ABNORMAL HIGH (ref 70–99)
Potassium: 3.8 mmol/L (ref 3.5–5.1)
Sodium: 140 mmol/L (ref 135–145)

## 2020-03-17 LAB — CBC
HCT: 37.7 % (ref 36.0–46.0)
Hemoglobin: 11.8 g/dL — ABNORMAL LOW (ref 12.0–15.0)
MCH: 29.9 pg (ref 26.0–34.0)
MCHC: 31.3 g/dL (ref 30.0–36.0)
MCV: 95.4 fL (ref 80.0–100.0)
Platelets: 186 10*3/uL (ref 150–400)
RBC: 3.95 MIL/uL (ref 3.87–5.11)
RDW: 13.2 % (ref 11.5–15.5)
WBC: 5.3 10*3/uL (ref 4.0–10.5)
nRBC: 0 % (ref 0.0–0.2)

## 2020-03-17 LAB — TROPONIN I (HIGH SENSITIVITY): Troponin I (High Sensitivity): 3 ng/L (ref <18)

## 2020-03-17 NOTE — ED Triage Notes (Signed)
Pt coming in c/o shortness of breath and left chest tightness radiating down left arm that started 3 days ago. Hx of copd

## 2020-03-18 ENCOUNTER — Emergency Department (HOSPITAL_COMMUNITY)
Admission: EM | Admit: 2020-03-18 | Discharge: 2020-03-18 | Disposition: A | Discharge status: Home / Self Care | Payer: Medicare Other | Attending: Emergency Medicine | Admitting: Emergency Medicine

## 2020-03-18 DIAGNOSIS — R079 Chest pain, unspecified: Secondary | ICD-10-CM

## 2020-03-18 NOTE — ED Provider Notes (Signed)
[facility]EMERGENC[facility]PT [facility] Emergency Department Provider Note MRN:  [id number]  Arrival date & time: 03/18/20     Chief Complaint   Chest Pain   History of Present Illness   Amber Rowland is a 86 y.o. year-old female with a history of COPD presenting to the ED with chief complaint of chest pain.  3 days of soreness in the chest, shoulders, neck.  Worse with certain positions, worse with motion.  Patient also concerned because she can feel her heart beating in her side and her back.  This is been present for a long time but she feels like it is beating faster than normal.  Denies any new shortness of breath, has chronic shortness of breath from COPD which is unchanged.  Denies dizziness or diaphoresis, no nausea or vomiting, no leg pain or swelling, no abdominal pain.  She explains that she is getting older and most of her friends her age have already died and she is worried.  Review of Systems  A complete 10 system review of systems was obtained and all systems are negative except as noted in the HPI and PMH.   Patient's Health History    Past Medical History:  Diagnosis Date  . Atrophy of left kidney    Chronic atrophy of the left kidney.  . Benign breast cysts in female 04/12/2012   Seen on mammography 2009   . Chronic constipation   . COPD (chronic obstructive pulmonary disease) (HCC)    Hyperinflation and heavy smoking history strongly  . Ejection fraction   . Esophageal dysmotility 02/14/2015  . Glaucoma (increased eye pressure)   . HTN (hypertension) 04/12/2012  . Ileus following gastrointestinal surgery (HCC) 04/28/2012  . Malnutrition (HCC) 04/28/2012  . Ovarian cyst, right 2009   s/p BSO 2009: OVARIAN FIBROTHECOMA in multiple fluid filled cysts  4.5cm region  . SBO (small bowel obstruction) (HCC)   . Severe malnutrition (HCC) 09/16/2013  . Stage III chronic kidney disease 02/13/2015  . UTI (lower urinary tract infection)    History of chronic recurrent UTIs     Past Surgical History:  Procedure Laterality Date  . BILATERAL OOPHORECTOMY  2009   PROCEDURE:  diagnostic laparoscopy, laparotomy with bilateral salpingo-  . LAPAROTOMY  04/23/2012   Procedure: EXPLORATORY LAPAROTOMY;  Surgeon: Valarie Merino, MD;  Location: [facility];  Service: General;  Laterality: N/A;  enterolysis     Family History  Problem Relation Age of Onset  . Diabetes Son   . High blood pressure Son   . Coronary artery disease Son     Social History   Socioeconomic History  . Marital status: Widowed    Spouse name: Not on file  . Number of children: Not on file  . Years of education: Not on file  . Highest education level: Not on file  Occupational History  . Not on file  Tobacco Use  . Smoking status: Former Smoker    Packs/day: 0.80    Years: 60.00    Pack years: 48.00    Types: Cigarettes    Quit date: 03/20/2014    Years since quitting: 6.0  . Smokeless tobacco: Never Used  Vaping Use  . Vaping Use: Never used  Substance and Sexual Activity  . Alcohol use: No  . Drug use: No  . Sexual activity: Never  Other Topics Concern  . Not on file  Social History Narrative  . Not on file   Social Determinants of Health   Financial  Resource Strain:   . Difficulty of Paying Living Expenses: Not on file  Food Insecurity:   . Worried About Programme researcher, broadcasting/film/video in the Last Year: Not on file  . Ran Out of Food in the Last Year: Not on file  Transportation Needs:   . Lack of Transportation (Medical): Not on file  . Lack of Transportation (Non-Medical): Not on file  Physical Activity:   . Days of Exercise per Week: Not on file  . Minutes of Exercise per Session: Not on file  Stress:   . Feeling of Stress : Not on file  Social Connections:   . Frequency of Communication with Friends and Family: Not on file  . Frequency of Social Gatherings with Friends and Family: Not on file  . Attends Religious Services: Not on file  . Active Member of Clubs or  Organizations: Not on file  . Attends Banker Meetings: Not on file  . Marital Status: Not on file  Intimate Partner Violence:   . Fear of Current or Ex-Partner: Not on file  . Emotionally Abused: Not on file  . Physically Abused: Not on file  . Sexually Abused: Not on file     Physical Exam   Vitals:   03/17/20 1839 03/18/20 0548  BP: (!) 170/95 (!) 177/101  Pulse: 87 93  Resp: 16 16  Temp: 97.8 F (36.6 C)   SpO2: 95% 96%    CONSTITUTIONAL: Chronically ill-appearing, NAD NEURO:  Alert and oriented x 3, no focal deficits EYES:  eyes equal and reactive ENT/NECK:  no LAD, no JVD CARDIO: Regular rate, well-perfused, normal S1 and S2, strong PMI noted fifth intercostal space mid axillary line PULM:  CTAB no wheezing or rhonchi GI/GU:  normal bowel sounds, non-distended, non-tender MSK/SPINE:  No gross deformities, no edema SKIN:  no rash, atraumatic PSYCH:  Appropriate speech and behavior  *Additional and/or pertinent findings included in MDM below  Diagnostic and Interventional Summary    EKG Interpretation  Date/Time:  March 17, 2020 at 16: 02: 54 Ventricular Rate:  83 PR Interval:  174 QRS Duration: 80   QT Interval: 362   QTC Calculation: 425 R Axis:     Text Interpretation: Sinus rhythm, no ischemic features Confirm by Dr. Kennis Carina at 6:48 AM      Labs Reviewed  BASIC METABOLIC PANEL - Abnormal; Notable for the following components:      Result Value   Glucose, Bld 101 (*)    GFR calc non Af Amer 52 (*)    All other components within normal limits  CBC - Abnormal; Notable for the following components:   Hemoglobin 11.8 (*)    All other components within normal limits  TROPONIN I (HIGH SENSITIVITY)  TROPONIN I (HIGH SENSITIVITY)    DG Chest 2 View  Final Result      Medications - No data to display   Procedures  /  Critical Care Procedures  ED Course and Medical Decision Making  I have reviewed the triage vital signs, the  nursing notes, and pertinent available records from the EMR.  Listed above are laboratory and imaging tests that I personally ordered, reviewed, and interpreted and then considered in my medical decision making (see below for details).  Very atypical chest pain, felt to be musculoskeletal in etiology.  Patient also seems to be seeking medical care due to a lot of anxiety about her overall health, concerned about her high blood pressure, her diet, feeling her  heartbeat in her chest.  All seems to be related to anxiety due to recent realization of her advanced age.  Patient's heart rate is regular, sinus rhythm on EKG, no evidence of arrhythmia.  Does have a strong PMI noted in the axilla, likely related to LVH and frail body habitus.  Highly doubt dissection or emergent process.  Troponin is negative, EKG without ischemic features.  Overall her symptoms have been present for some time and she is appropriate for primary care follow-up.       Elmer Sow. Pilar Plate, MD [facility] Emergency Medicine [facility] [email or web address]  Final Clinical Impressions(s) / ED Diagnoses     ICD-10-CM   1. Chest pain, unspecified type  R07.9     ED Discharge Orders    None       Discharge Instructions Discussed with and Provided to Patient:     Discharge Instructions     You were evaluated in the Emergency Department and after careful evaluation, we did not find any emergent condition requiring admission or further testing in the hospital.  Your exam/testing today was overall reassuring.  No evidence of heart damage or heart attack.  Your pain may be related to muscle soreness.  Your heart rate has been slightly elevated and this may be due to too many caffeinated beverages.  We recommend that you drink more water and less caffeine and follow-up closely with your primary care doctor.  Your primary care doctor can discuss further dietary changes and also possibly adjust your high blood  pressure medications.  They may also want to consider a stress test of your heart if you continue to have pain.  Please return to the Emergency Department if you experience any worsening of your condition.  Thank you for allowing [place] to be a part of your care.        Sabas Sous, MD 03/18/20 [phone]

## 2020-03-18 NOTE — Discharge Instructions (Addendum)
You were evaluated in the Emergency Department and after careful evaluation, we did not find any emergent condition requiring admission or further testing in the hospital.  Your exam/testing today was overall reassuring.  No evidence of heart damage or heart attack.  Your pain may be related to muscle soreness.  Your heart rate has been slightly elevated and this may be due to too many caffeinated beverages.  We recommend that you drink more water and less caffeine and follow-up closely with your primary care doctor.  Your primary care doctor can discuss further dietary changes and also possibly adjust your high blood pressure medications.  They may also want to consider a stress test of your heart if you continue to have pain.  Please return to the Emergency Department if you experience any worsening of your condition.  Thank you for allowing [place] to be a part of your care.

## 2020-04-04 ENCOUNTER — Ambulatory Visit: Payer: Medicare Other | Admitting: Internal Medicine

## 2020-04-08 ENCOUNTER — Ambulatory Visit (HOSPITAL_BASED_OUTPATIENT_CLINIC_OR_DEPARTMENT_OTHER): Payer: Medicare Other | Admitting: Internal Medicine

## 2020-04-08 ENCOUNTER — Ambulatory Visit (HOSPITAL_BASED_OUTPATIENT_CLINIC_OR_DEPARTMENT_OTHER): Payer: Medicare Other | Admitting: Pharmacist

## 2020-04-08 ENCOUNTER — Encounter: Payer: Self-pay | Admitting: Internal Medicine

## 2020-04-08 ENCOUNTER — Other Ambulatory Visit: Payer: Self-pay

## 2020-04-08 VITALS — BP 138/77 | HR 86 | Resp 16 | Wt 81.0 lb

## 2020-04-08 DIAGNOSIS — Z23 Encounter for immunization: Secondary | ICD-10-CM | POA: Diagnosis not present

## 2020-04-08 DIAGNOSIS — Z5321 Procedure and treatment not carried out due to patient leaving prior to being seen by health care provider: Secondary | ICD-10-CM

## 2020-04-08 NOTE — Progress Notes (Signed)
Patient presents for vaccination against strep pneumo and influenza per orders of Dr. Johnson. Consent given. Counseling provided. No contraindications exists. Vaccine administered without incident.   Luke Van Ausdall, PharmD, CPP Clinical Pharmacist [facility] [phone]  

## 2020-04-08 NOTE — Progress Notes (Signed)
Pt states she is needing something for her stomach. Pt states she has been sick on the stomach   Pt is requesting a muscle relaxer for her neck and back

## 2020-04-08 NOTE — Patient Instructions (Addendum)
Influenza Virus Vaccine injection (Fluarix) What is this medicine? INFLUENZA VIRUS VACCINE (in floo EN zuh VAHY ruhs vak SEEN) helps to reduce the risk of getting influenza also known as the flu. This medicine may be used for other purposes; ask your health care provider or pharmacist if you have questions. COMMON BRAND NAME(S): Fluarix, Fluzone What should I tell my health care provider before I take this medicine? They need to know if you have any of these conditions:  bleeding disorder like hemophilia  fever or infection  Guillain-Barre syndrome or other neurological problems  immune system problems  infection with the human immunodeficiency virus (HIV) or AIDS  low blood platelet counts  multiple sclerosis  an unusual or allergic reaction to influenza virus vaccine, eggs, chicken proteins, latex, gentamicin, other medicines, foods, dyes or preservatives  pregnant or trying to get pregnant  breast-feeding How should I use this medicine? This vaccine is for injection into a muscle. It is given by a health care professional. A copy of Vaccine Information Statements will be given before each vaccination. Read this sheet carefully each time. The sheet may change frequently. Talk to your pediatrician regarding the use of this medicine in children. Special care may be needed. Overdosage: If you think you have taken too much of this medicine contact a poison control center or emergency room at once. NOTE: This medicine is only for you. Do not share this medicine with others. What if I miss a dose? This does not apply. What may interact with this medicine?  chemotherapy or radiation therapy  medicines that lower your immune system like etanercept, anakinra, infliximab, and adalimumab  medicines that treat or prevent blood clots like warfarin  phenytoin  steroid medicines like prednisone or cortisone  theophylline  vaccines This list may not describe all possible  interactions. Give your health care provider a list of all the medicines, herbs, non-prescription drugs, or dietary supplements you use. Also tell them if you smoke, drink alcohol, or use illegal drugs. Some items may interact with your medicine. What should I watch for while using this medicine? Report any side effects that do not go away within 3 days to your doctor or health care professional. Call your health care provider if any unusual symptoms occur within 6 weeks of receiving this vaccine. You may still catch the flu, but the illness is not usually as bad. You cannot get the flu from the vaccine. The vaccine will not protect against colds or other illnesses that may cause fever. The vaccine is needed every year. What side effects may I notice from receiving this medicine? Side effects that you should report to your doctor or health care professional as soon as possible:  allergic reactions like skin rash, itching or hives, swelling of the face, lips, or tongue Side effects that usually do not require medical attention (report to your doctor or health care professional if they continue or are bothersome):  fever  headache  muscle aches and pains  pain, tenderness, redness, or swelling at site where injected  weak or tired This list may not describe all possible side effects. Call your doctor for medical advice about side effects. You may report side effects to FDA at 1-800-FDA-1088. Where should I keep my medicine? This vaccine is only given in a clinic, pharmacy, doctor's office, or other health care setting and will not be stored at home. NOTE: This sheet is a summary. It may not cover all possible information. If you have questions   about this medicine, talk to your doctor, pharmacist, or health care provider.  2020 Elsevier/Gold Standard (2008-01-04 09:30:40)   Pneumococcal Conjugate Vaccine (PCV13): What You Need to Know 1. Why get vaccinated? Pneumococcal conjugate vaccine  (PCV13) can prevent pneumococcal disease. Pneumococcal disease refers to any illness caused by pneumococcal bacteria. These bacteria can cause many types of illnesses, including pneumonia, which is an infection of the lungs. Pneumococcal bacteria are one of the most common causes of pneumonia. Besides pneumonia, pneumococcal bacteria can also cause:  Ear infections  Sinus infections  Meningitis (infection of the tissue covering the brain and spinal cord)  Bacteremia (bloodstream infection) Anyone can get pneumococcal disease, but children under 71 years of age, people with certain medical conditions, adults 65 years or older, and cigarette smokers are at the highest risk. Most pneumococcal infections are mild. However, some can result in long-term problems, such as brain damage or hearing loss. Meningitis, bacteremia, and pneumonia caused by pneumococcal disease can be fatal. 2. PCV13 PCV13 protects against 13 types of bacteria that cause pneumococcal disease. Infants and young children usually need 4 doses of pneumococcal conjugate vaccine, at 2, 4, 6, and 34-94 months of age. In some cases, a child might need fewer than 4 doses to complete PCV13 vaccination. A dose of PCV23 vaccine is also recommended for anyone 2 years or older with certain medical conditions if they did not already receive PCV13. This vaccine may be given to adults 65 years or older based on discussions between the patient and health care provider. 3. Talk with your health care provider Tell your vaccine provider if the person getting the vaccine:  Has had an allergic reaction after a previous dose of PCV13, to an earlier pneumococcal conjugate vaccine known as PCV7, or to any vaccine containing diphtheria toxoid (for example, DTaP), or has any severe, life-threatening allergies.  In some cases, your health care provider may decide to postpone PCV13 vaccination to a future visit. People with minor illnesses, such as a  cold, may be vaccinated. People who are moderately or severely ill should usually wait until they recover before getting PCV13. Your health care provider can give you more information. 4. Risks of a vaccine reaction  Redness, swelling, pain, or tenderness where the shot is given, and fever, loss of appetite, fussiness (irritability), feeling tired, headache, and chills can happen after PCV13. Young children may be at increased risk for seizures caused by fever after PCV13 if it is administered at the same time as inactivated influenza vaccine. Ask your health care provider for more information. People sometimes faint after medical procedures, including vaccination. Tell your provider if you feel dizzy or have vision changes or ringing in the ears. As with any medicine, there is a very remote chance of a vaccine causing a severe allergic reaction, other serious injury, or death. 5. What if there is a serious problem? An allergic reaction could occur after the vaccinated person leaves the clinic. If you see signs of a severe allergic reaction (hives, swelling of the face and throat, difficulty breathing, a fast heartbeat, dizziness, or weakness), call 9-1-1 and get the person to the nearest hospital. For other signs that concern you, call your health care provider. Adverse reactions should be reported to the Vaccine Adverse Event Reporting System (VAERS). Your health care provider will usually file this report, or you can do it yourself. Visit the VAERS website at www.vaers.LAgents.no or call 845 069 4995. VAERS is only for reporting reactions, and VAERS staff do not  give medical advice. 6. The National Vaccine Injury Compensation Program The [employer] Vaccine Injury Compensation Program (VICP) is a federal program that was created to compensate people who may have been injured by certain vaccines. Visit the VICP website at SpiritualWord.at or call 319-722-6682 to learn about the program  and about filing a claim. There is a time limit to file a claim for compensation. 7. How can I learn more?  Ask your health care provider.  Call your local or state health department.  Contact the Centers for Disease Control and Prevention (CDC): ? Call 310 771 5901 (1-800-CDC-INFO) or ? Visit CDC's website at PicCapture.uy Vaccine Information Statement PCV13 Vaccine (04/20/2018) This information is not intended to replace advice given to you by your health care provider. Make sure you discuss any questions you have with your health care provider. Document Revised: 09/27/2018 Document Reviewed: 01/18/2018 Elsevier Patient Education  2020 [employer].

## 2020-04-10 NOTE — Progress Notes (Signed)
Pt decided to leave before being seen and rescheduled for later this wk.

## 2020-04-12 ENCOUNTER — Ambulatory Visit: Payer: Medicare Other | Attending: Internal Medicine | Admitting: Internal Medicine

## 2020-04-12 ENCOUNTER — Other Ambulatory Visit: Payer: Self-pay

## 2020-04-12 DIAGNOSIS — I10 Essential (primary) hypertension: Secondary | ICD-10-CM

## 2020-04-12 DIAGNOSIS — K5909 Other constipation: Secondary | ICD-10-CM

## 2020-04-12 DIAGNOSIS — M8949 Other hypertrophic osteoarthropathy, multiple sites: Secondary | ICD-10-CM

## 2020-04-12 DIAGNOSIS — E440 Moderate protein-calorie malnutrition: Secondary | ICD-10-CM | POA: Diagnosis not present

## 2020-04-12 DIAGNOSIS — Z1331 Encounter for screening for depression: Secondary | ICD-10-CM

## 2020-04-12 DIAGNOSIS — M159 Polyosteoarthritis, unspecified: Secondary | ICD-10-CM

## 2020-04-12 MED ORDER — POLYETHYLENE GLYCOL 3350 17 GM/SCOOP PO POWD: 17.0000 g | Freq: Every day | ORAL | 1 refills | Status: DC | PRN

## 2020-04-12 MED ORDER — AMLODIPINE BESYLATE 5 MG PO TABS: 7.5000 mg | ORAL_TABLET | Freq: Every day | ORAL | 2 refills | Status: DC

## 2020-04-12 NOTE — Progress Notes (Signed)
Virtual Visit via Telephone Note Due to current restrictions/limitations of in-office visits due to the COVID-19 pandemic, this scheduled clinical appointment was converted to a telehealth visit  I connected with Amber Rowland on 04/12/20 at 2:56 p.m by telephone and verified that I am speaking with the correct person using two identifiers.  Location: Patient: home Provider: Office at [facility] The pt, her daughter and myself participated in this encounter   I discussed the limitations, risks, security and privacy concerns of performing an evaluation and management service by telephone and the availability of in person appointments. I also discussed with the patient that there may be a patient responsible charge related to this service. The patient expressed understanding and agreed to proceed.   History of Present Illness: Patient with history of HTN, left RAS, atherosclerotic aortic small plaque rupture, COPD, esophageal dysmotility, tobacco dependence, anxiety, CKD 3, pulmonary nodules, arthritis, glaucoma.  Last seen 12/2019.  Today is chronic ds management.  C/o constipation.  Request stool softener.  She purchase MOM OTC and it caused stomach cramps.  HTN:  Needs RF on Norvasc. Dose increased on last visit to 7.5 mg.  Tolerating increase okay.  She limits salt in foods.     Malnutrition:  Gained 5 lbs since last visit with me.  Reports she has been eating.  COPD:  Symbicort burns her chest when she uses it so she d/c using.  Daughter reports Albuterol inh works better for her. Uses it 2 x a day.  Rec flu shot and Prevnar 13.  Receive COVID vaccine [employer] series.  OA: she was afraid to try the Tramadol. Using Tylenol  Outpatient Encounter Medications as of 04/12/2020  Medication Sig  . acetaminophen (TYLENOL) 500 MG tablet Take 500 mg by mouth every 6 (six) hours as needed for moderate pain.  Marland Kitchen albuterol (VENTOLIN HFA) 108 (90 Base) MCG/ACT inhaler Inhale 2 puffs into the lungs every  6 (six) hours as needed for wheezing or shortness of breath.  Marland Kitchen amLODipine (NORVASC) 5 MG tablet Take 1.5 tablets (7.5 mg total) by mouth daily.  Marland Kitchen aspirin EC 81 MG tablet Take 81-162 mg by mouth daily. Swallow whole.  . brimonidine (ALPHAGAN) 0.15 % ophthalmic solution Place 1 drop into both eyes 2 (two) times daily.   . Multiple Vitamins-Minerals (CENTRUM SILVER PO) Take 1 tablet by mouth daily.  Marland Kitchen tolnaftate (TINACTIN) 1 % cream Apply 1 application topically 2 (two) times daily. Apply to feet twice daily  . traMADol (ULTRAM) 50 MG tablet Take 0.5 tablets (25 mg total) by mouth every 12 (twelve) hours as needed.   No facility-administered encounter medications on file as of 04/12/2020.       Observations/Objective: Wt Readings from Last 3 Encounters:  04/08/20 81 lb (36.7 kg)  03/17/20 80 lb (36.3 kg)  01/11/20 76 lb 9.6 oz (34.7 kg)   Depression screen [facility] 2/9 04/08/2020 01/11/2020  Decreased Interest 2 2  Down, Depressed, Hopeless 1 2  PHQ - 2 Score 3 4  Altered sleeping 2 2  Tired, decreased energy 1 3  Change in appetite 2 0  Feeling bad or failure about yourself  1 1  Trouble concentrating 3 3  Moving slowly or fidgety/restless 1 0  Suicidal thoughts 0 0  PHQ-9 Score 13 13     Assessment and Plan: 1. Essential hypertension Continue Norvasc. - amLODipine (NORVASC) 5 MG tablet; Take 1.5 tablets (7.5 mg total) by mouth daily.  Dispense: 45 tablet; Refill: 2  2. Chronic  constipation Increase fiber intake recommended.  Will give a trial of MiraLAX. - polyethylene glycol powder (GLYCOLAX/MIRALAX) 17 GM/SCOOP powder; Take 17 g by mouth daily as needed for moderate constipation.  Dispense: 3350 g; Refill: 1  3. Primary osteoarthritis involving multiple joints Patient too afraid to try tramadol because she feels it may make her too drowsy.  Okay to continue Tylenol.  4. Moderate protein-calorie malnutrition (HCC) I was glad to see that her weight has gone up 5 pounds  since last visit.  She will continue trying to eat several meals a day even if they are small meals.  5. Positive depression screening This was positive and we did not get to discuss it today.  However I have sent a message to our LCSW to follow-up with the patient   Follow Up Instructions: Follow-up in 4 months.   I discussed the assessment and treatment plan with the patient. The patient was provided an opportunity to ask questions and all were answered. The patient agreed with the plan and demonstrated an understanding of the instructions.   The patient was advised to call back or seek an in-person evaluation if the symptoms worsen or if the condition fails to improve as anticipated.  I provided 12 minutes of non-face-to-face time during this encounter.   Jonah Blue, MD

## 2020-05-02 ENCOUNTER — Institutional Professional Consult (permissible substitution): Payer: Medicare Other | Admitting: Licensed Clinical Social Worker

## 2020-05-03 ENCOUNTER — Telehealth: Payer: Self-pay | Admitting: Internal Medicine

## 2020-05-03 NOTE — Telephone Encounter (Signed)
Patient's daughter Ryan,Patricia is calling because the patient has an appt on Monday. Patient's daughter does not feel that the patient needs a Child psychotherapist. And wants to know what the appt is for. Please advise Cb- [phone]

## 2020-05-06 ENCOUNTER — Ambulatory Visit: Payer: Medicare Other | Attending: Internal Medicine | Admitting: Licensed Clinical Social Worker

## 2020-05-06 ENCOUNTER — Other Ambulatory Visit: Payer: Self-pay

## 2020-05-06 DIAGNOSIS — F411 Generalized anxiety disorder: Secondary | ICD-10-CM

## 2020-05-06 NOTE — Telephone Encounter (Signed)
Incoming call received from patient regarding IBH appointment. LCSW introduced self and explained role at [facility]. Pt is agreeable to scheduled meeting 11/15.

## 2020-05-13 ENCOUNTER — Telehealth: Payer: Self-pay | Admitting: Internal Medicine

## 2020-05-13 MED ORDER — SERTRALINE HCL 50 MG PO TABS: 25.0000 mg | ORAL_TABLET | Freq: Every day | ORAL | 2 refills | Status: DC

## 2020-05-13 NOTE — Telephone Encounter (Signed)
-----   Message from Bridgett Larsson, LCSW sent at 05/13/2020  5:34 PM EST ----- Regarding: Medication Request Hi Dr. Laural Benes, I completed an [facility] appointment with patient and her adult daughter. Pt endorsed excessive worry and is interested in initiating medication management to assist with decrease of symptoms. She has an upcoming appt with Dr. Delford Field next month and would like to start meds sooner. Please advise.

## 2020-05-13 NOTE — BH Specialist Note (Signed)
Integrated Behavioral Health Initial In-Person Visit  MRN: [id number] Name: Amber Rowland  Number of Integrated Behavioral Health Clinician visits:: 1/6 Session Start time: 3:15 PM  Session End time: 3:45 PM Total time: 30 minutes  Types of Service: Individual psychotherapy  Interpretor:No. Interpretor Name and Language: NA   Subjective: Amber Rowland is a 86 y.o. female accompanied by Adult daughter Patient was referred by Dr. Laural Benes for depression. Patient reports the following symptoms/concerns: Pt and adult daughter endorses excessive worry regarding chronic medical conditions, family's health, and finances. Pt reports concern for management of health conditions while residing alone Duration of problem: Ongoing; Severity of problem: moderate  Objective: Mood: Anxious and Affect: Appropriate Risk of harm to self or others: No plan to harm self or others  Life Context: Family and Social: Pt receives strong support from family  School/Work: Pt receives SSI Self-Care: Pt is interested in medication management to assist with symptoms Life Changes: Pt is concerned about managing chronic health conditions   Patient and/or Family's Strengths/Protective Factors: Social connections, Social and Emotional competence, Concrete supports in place (healthy food, safe environments, etc.) and Sense of purpose  Goals Addressed: Patient will: 1. Reduce symptoms of: anxiety Pt is interested in medication management through PCP.  2. Increase knowledge and/or ability of: coping skills Pt agreed to utilize healthy coping skills identified in session 3. Demonstrate ability to: Increase adequate support systems for patient/family Pt agreed to think about Personal Care Services to assist with support with ADL's   Progress towards Goals: Ongoing  Interventions: Interventions utilized: Solution-Focused Strategies, Supportive Counseling and Psychoeducation and/or Health Education   Standardized Assessments completed: Not Needed  Patient and/or Family Response: Pt was engaged during session and was successful in identifying healthy coping skills to assist in management of symptoms. Pt was provided supportive resources and is interested in medication management  Patient Centered Plan: Patient is on the following Treatment Plan(s):  Anxiety  Assessment: Patient currently experiencing difficulty managing anxiety and depression symptoms triggered by chronic medical conditions.   Patient may benefit from therapy and medication managment.  Plan: 1. Follow up with behavioral health clinician on : Schedule follow up appointment with LCSW for any additional behavioral health and/or resource needs 2. Behavioral recommendations: Utilize strategies discussed and think about Personal Care Services to strengthen support system 3. Referral(s): Integrated Behavioral Health Services (In Clinic) 4. "From scale of 1-10, how likely are you to follow plan?":   Bridgett Larsson, LCSW 05/13/2020 5:33 PM

## 2020-05-14 NOTE — Telephone Encounter (Signed)
Called pt unable to reach, left VM RX was sent to pharmacy.Name and contact provided

## 2020-05-15 NOTE — Telephone Encounter (Signed)
2 nd call made patient , unable to reach , left detail VM as consented / name and nr to call back provided

## 2020-06-12 ENCOUNTER — Ambulatory Visit: Payer: Medicare Other | Admitting: Critical Care Medicine

## 2020-07-11 ENCOUNTER — Other Ambulatory Visit: Payer: Self-pay | Admitting: Internal Medicine

## 2020-07-23 ENCOUNTER — Ambulatory Visit: Payer: Medicare Other | Admitting: Nurse Practitioner

## 2020-08-07 ENCOUNTER — Other Ambulatory Visit: Payer: Self-pay

## 2020-08-07 ENCOUNTER — Encounter (HOSPITAL_COMMUNITY): Payer: Self-pay | Admitting: Emergency Medicine

## 2020-08-07 ENCOUNTER — Ambulatory Visit (HOSPITAL_COMMUNITY)
Admission: EM | Admit: 2020-08-07 | Discharge: 2020-08-07 | Disposition: A | Discharge status: Home / Self Care | Payer: Medicare Other | Attending: Family Medicine | Admitting: Family Medicine

## 2020-08-07 DIAGNOSIS — R600 Localized edema: Secondary | ICD-10-CM | POA: Diagnosis not present

## 2020-08-07 MED ORDER — MEDICAL COMPRESSION STOCKINGS MISC: 0 refills | Status: AC

## 2020-08-07 MED ORDER — FUROSEMIDE 20 MG PO TABS: ORAL_TABLET | ORAL | 0 refills | Status: AC

## 2020-08-07 NOTE — ED Triage Notes (Signed)
Patient went to [facility] emergency department approx 2 months ago.  Patient was seen for bilateral leg cellulitis and treated with cephalexin.    Patient reports legs improved, but not completely well. Now redness, swelling and pain has worsened.  Dry, scaly skin from mid lower leg and including her feet.  Redness extends to just below knees

## 2020-08-08 NOTE — ED Provider Notes (Signed)
[facility]   [id number] 08/07/20 Arrival Time: 1509  ASSESSMENT & PLAN:  1. Bilateral lower extremity edema     No signs of skin infection. Begin trial of Lasix. Discussed support hose. Rx given.  Meds ordered this encounter  Medications  . Elastic Bandages & Supports (MEDICAL COMPRESSION STOCKINGS) MISC    Sig: Use as directed. Diagnosis: bilateral lower extremity edema R22.43. Please fit as appropriate.    Dispense:  2 each    Refill:  0  . furosemide (LASIX) 20 MG tablet    Sig: Take 1-2 tablets every morning to help with the swelling in your legs.    Dispense:  14 tablet    Refill:  0     Follow-up Information    Schedule an appointment as soon as possible for a visit  with Marcine Matar, MD.   Specialty: Internal Medicine Contact information: [address] [place] [address] [phone]               Reviewed expectations re: course of current medical issues. Questions answered. Outlined signs and symptoms indicating need for more acute intervention. Understanding verbalized. After Visit Summary given.   SUBJECTIVE: History from: patient and caregiver. Amber Rowland is a 87 y.o. female who reports chronic bilateral leg swelling. "Feel swollen and tight". Overlying redness that has not changed in the past several months. Afebrile. Able to bear wt with discomfort. Skin dry and cracking. Denies: fever and difficulty breathing. Normal PO intake without n/v/d.    OBJECTIVE:  Vitals:   08/07/20 1527  BP: 134/76  Pulse: 81  Resp: 20  Temp: 98.1 F (36.7 C)  TempSrc: Oral  SpO2: 97%    General appearance: alert; no distress Neck: supple  Lungs: speaks full sentences without difficulty; unlabored Extremities: 2+ bilateral LE pitting edema; TTP; overlying erythema without much warmth; skin is very dry and cracking; no signs of infection Skin: warm and dry Neurologic: normal gait Psychological: alert and cooperative; normal  mood and affect  Allergies  Allergen Reactions  . Advair Hfa [Fluticasone-Salmeterol]     Did not tolerate  . Symbicort [Budesonide-Formoterol Fumarate]     chet pain    Past Medical History:  Diagnosis Date  . Atrophy of left kidney    Chronic atrophy of the left kidney.  . Benign breast cysts in female 04/12/2012   Seen on mammography 2009   . Chronic constipation   . COPD (chronic obstructive pulmonary disease) (HCC)    Hyperinflation and heavy smoking history strongly  . Ejection fraction   . Esophageal dysmotility 02/14/2015  . Glaucoma (increased eye pressure)   . HTN (hypertension) 04/12/2012  . Ileus following gastrointestinal surgery (HCC) 04/28/2012  . Malnutrition (HCC) 04/28/2012  . Ovarian cyst, right 2009   s/p BSO 2009: OVARIAN FIBROTHECOMA in multiple fluid filled cysts  4.5cm region  . SBO (small bowel obstruction) (HCC)   . Severe malnutrition (HCC) 09/16/2013  . Stage III chronic kidney disease (HCC) 02/13/2015  . UTI (lower urinary tract infection)    History of chronic recurrent UTIs   Social History   Socioeconomic History  . Marital status: Widowed    Spouse name: Not on file  . Number of children: Not on file  . Years of education: Not on file  . Highest education level: Not on file  Occupational History  . Not on file  Tobacco Use  . Smoking status: Former Smoker    Packs/day: 0.80  Years: 60.00    Pack years: 48.00    Types: Cigarettes    Quit date: 03/20/2014    Years since quitting: 6.3  . Smokeless tobacco: Never Used  Vaping Use  . Vaping Use: Never used  Substance and Sexual Activity  . Alcohol use: No  . Drug use: No  . Sexual activity: Never  Other Topics Concern  . Not on file  Social History Narrative  . Not on file   Social Determinants of Health   Financial Resource Strain: Not on file  Food Insecurity: Not on file  Transportation Needs: Not on file  Physical Activity: Not on file  Stress: Not on file  Social  Connections: Not on file  Intimate Partner Violence: Not on file   Family History  Problem Relation Age of Onset  . Diabetes Son   . High blood pressure Son   . Coronary artery disease Son    Past Surgical History:  Procedure Laterality Date  . BILATERAL OOPHORECTOMY  2009   PROCEDURE:  diagnostic laparoscopy, laparotomy with bilateral salpingo-  . LAPAROTOMY  04/23/2012   Procedure: EXPLORATORY LAPAROTOMY;  Surgeon: Valarie Merino, MD;  Location: [facility];  Service: General;  Laterality: N/A;  enterolysis      Mardella Layman, MD 08/08/20 [id number]

## 2020-08-11 ENCOUNTER — Other Ambulatory Visit: Payer: Self-pay | Admitting: Internal Medicine

## 2020-08-11 DIAGNOSIS — I10 Essential (primary) hypertension: Secondary | ICD-10-CM

## 2020-08-28 ENCOUNTER — Ambulatory Visit: Payer: Medicare Other | Admitting: Physician Assistant

## 2020-08-29 ENCOUNTER — Other Ambulatory Visit: Payer: Self-pay | Admitting: Internal Medicine

## 2020-08-29 DIAGNOSIS — I10 Essential (primary) hypertension: Secondary | ICD-10-CM

## 2020-09-02 ENCOUNTER — Telehealth: Payer: Self-pay | Admitting: Internal Medicine

## 2020-09-02 NOTE — Telephone Encounter (Signed)
Daughter had to cancel her mother's appt b/c of her work, however her mother's appt was for redness in her legs and some swelling. This has been a issue before and was put on fluid pill, earliest appt with PA 4/14. Pls fu as daughter is off work on Mar 21, 22, 25. Elease Hashimoto, daughter, # [phone] to resch maybe earlier?

## 2020-09-04 ENCOUNTER — Telehealth: Payer: Medicare Other | Admitting: Physician Assistant

## 2020-09-05 ENCOUNTER — Ambulatory Visit: Payer: Medicare Other | Admitting: Nurse Practitioner

## 2020-09-05 NOTE — Telephone Encounter (Signed)
Returned call to patients daughter and offered an appointment with the mobile medicine unit on Monday or Tuesday for in person assessment for her mother. Patients daughter stated she thought that she had scheduled an appointment at [facility] when she cancelled the other. I let her know that nothing was scheduled yet. Patients daughter opted for an appointment in April with Marylene Land. I scheduled patient for 10/17/20.

## 2020-09-05 NOTE — Telephone Encounter (Signed)
This will need to be address in person. Please schedule with any available provider or refer to mobile bus. If not available appointments and not able to get to mobile bus pt will need to be seen at ED or Urgent Care

## 2020-09-05 NOTE — Telephone Encounter (Signed)
FYI

## 2020-09-05 NOTE — Telephone Encounter (Signed)
Would this be something appropriate to schedule virtual with Amber Rowland on Tuesday or should I refer to mobile for in person?

## 2020-09-27 DIAGNOSIS — Z23 Encounter for immunization: Secondary | ICD-10-CM | POA: Diagnosis not present

## 2020-10-17 ENCOUNTER — Ambulatory Visit: Payer: Medicare Other | Admitting: Physician Assistant

## 2020-10-30 DIAGNOSIS — H401134 Primary open-angle glaucoma, bilateral, indeterminate stage: Secondary | ICD-10-CM | POA: Diagnosis not present

## 2020-11-27 ENCOUNTER — Ambulatory Visit (INDEPENDENT_AMBULATORY_CARE_PROVIDER_SITE_OTHER): Payer: Medicare Other | Admitting: Podiatry

## 2020-11-27 ENCOUNTER — Other Ambulatory Visit: Payer: Self-pay

## 2020-11-27 ENCOUNTER — Encounter: Payer: Self-pay | Admitting: Podiatry

## 2020-11-27 DIAGNOSIS — M79674 Pain in right toe(s): Secondary | ICD-10-CM | POA: Diagnosis not present

## 2020-11-27 DIAGNOSIS — N179 Acute kidney failure, unspecified: Secondary | ICD-10-CM

## 2020-11-27 DIAGNOSIS — B351 Tinea unguium: Secondary | ICD-10-CM | POA: Diagnosis not present

## 2020-11-27 DIAGNOSIS — M79675 Pain in left toe(s): Secondary | ICD-10-CM | POA: Diagnosis not present

## 2020-11-27 NOTE — Progress Notes (Addendum)
This patient returns to my office for at risk foot care.  This patient requires this care by a professional since this patient will be at risk due to having CKD.  This patient presents to the office with a female caregiver.  She has not been seen for over 2 years.  This patient is unable to cut nails herself since the patient cannot reach her nails.These nails are painful walking and wearing shoes.  This patient presents for at risk foot care today.  General Appearance  Alert, conversant and in no acute stress.  Vascular  Dorsalis pedis and posterior tibial  pulses are  Weakly palpable  bilaterally.  Capillary return is within normal limits  bilaterally. Cold feet bilaterally. Absent digital hair  B/L.  Neurologic  Senn-Weinstein monofilament wire test diminished   bilaterally. Muscle power within normal limits bilaterally.  Nails Thick disfigured discolored nails with subungual debris  from hallux to fifth toes bilaterally. No evidence of bacterial infection or drainage bilaterally.  Orthopedic  No limitations of motion  feet .  No crepitus or effusions noted.  No bony pathology or digital deformities noted.  Hammer toes  B/L.  Skin  normotropic skin with no porokeratosis noted bilaterally.  No signs of infections or ulcers noted.     Onychomycosis  Pain in right toes  Pain in left toes  Consent was obtained for treatment procedures.   Mechanical debridement of nails 1-5  bilaterally performed with a nail nipper.  Filed with dremel without incident.    Return office visit     4 months                 Told patient to return for periodic foot care and evaluation due to potential at risk complications.   Helane Gunther DPM

## 2020-11-28 ENCOUNTER — Ambulatory Visit: Payer: Medicare Other | Admitting: Physician Assistant

## 2020-12-14 ENCOUNTER — Other Ambulatory Visit: Payer: Self-pay

## 2020-12-14 DIAGNOSIS — G911 Obstructive hydrocephalus: Secondary | ICD-10-CM | POA: Diagnosis not present

## 2020-12-14 DIAGNOSIS — I129 Hypertensive chronic kidney disease with stage 1 through stage 4 chronic kidney disease, or unspecified chronic kidney disease: Secondary | ICD-10-CM | POA: Diagnosis present

## 2020-12-14 DIAGNOSIS — D62 Acute posthemorrhagic anemia: Secondary | ICD-10-CM | POA: Diagnosis not present

## 2020-12-14 DIAGNOSIS — Z923 Personal history of irradiation: Secondary | ICD-10-CM

## 2020-12-14 DIAGNOSIS — J690 Pneumonitis due to inhalation of food and vomit: Secondary | ICD-10-CM | POA: Diagnosis not present

## 2020-12-14 DIAGNOSIS — Z66 Do not resuscitate: Secondary | ICD-10-CM | POA: Diagnosis not present

## 2020-12-14 DIAGNOSIS — E8809 Other disorders of plasma-protein metabolism, not elsewhere classified: Secondary | ICD-10-CM | POA: Diagnosis not present

## 2020-12-14 DIAGNOSIS — J449 Chronic obstructive pulmonary disease, unspecified: Secondary | ICD-10-CM | POA: Diagnosis present

## 2020-12-14 DIAGNOSIS — T501X5A Adverse effect of loop [high-ceiling] diuretics, initial encounter: Secondary | ICD-10-CM | POA: Diagnosis not present

## 2020-12-14 DIAGNOSIS — K55029 Acute infarction of small intestine, extent unspecified: Secondary | ICD-10-CM | POA: Diagnosis present

## 2020-12-14 DIAGNOSIS — D6959 Other secondary thrombocytopenia: Secondary | ICD-10-CM | POA: Diagnosis not present

## 2020-12-14 DIAGNOSIS — Z833 Family history of diabetes mellitus: Secondary | ICD-10-CM

## 2020-12-14 DIAGNOSIS — N1832 Chronic kidney disease, stage 3b: Secondary | ICD-10-CM | POA: Diagnosis present

## 2020-12-14 DIAGNOSIS — E43 Unspecified severe protein-calorie malnutrition: Secondary | ICD-10-CM | POA: Diagnosis present

## 2020-12-14 DIAGNOSIS — Z8249 Family history of ischemic heart disease and other diseases of the circulatory system: Secondary | ICD-10-CM

## 2020-12-14 DIAGNOSIS — I96 Gangrene, not elsewhere classified: Secondary | ICD-10-CM | POA: Diagnosis present

## 2020-12-14 DIAGNOSIS — N3941 Urge incontinence: Secondary | ICD-10-CM | POA: Diagnosis present

## 2020-12-14 DIAGNOSIS — R5381 Other malaise: Secondary | ICD-10-CM | POA: Diagnosis present

## 2020-12-14 DIAGNOSIS — R1310 Dysphagia, unspecified: Secondary | ICD-10-CM | POA: Diagnosis present

## 2020-12-14 DIAGNOSIS — E611 Iron deficiency: Secondary | ICD-10-CM | POA: Diagnosis present

## 2020-12-14 DIAGNOSIS — Z20822 Contact with and (suspected) exposure to covid-19: Secondary | ICD-10-CM | POA: Diagnosis not present

## 2020-12-14 DIAGNOSIS — E875 Hyperkalemia: Secondary | ICD-10-CM | POA: Diagnosis not present

## 2020-12-14 DIAGNOSIS — N179 Acute kidney failure, unspecified: Secondary | ICD-10-CM | POA: Diagnosis present

## 2020-12-14 DIAGNOSIS — I959 Hypotension, unspecified: Secondary | ICD-10-CM | POA: Diagnosis not present

## 2020-12-14 DIAGNOSIS — J90 Pleural effusion, not elsewhere classified: Secondary | ICD-10-CM | POA: Diagnosis not present

## 2020-12-14 DIAGNOSIS — R10817 Generalized abdominal tenderness: Secondary | ICD-10-CM | POA: Diagnosis not present

## 2020-12-14 DIAGNOSIS — Z681 Body mass index (BMI) 19 or less, adult: Secondary | ICD-10-CM

## 2020-12-14 DIAGNOSIS — E8770 Fluid overload, unspecified: Secondary | ICD-10-CM | POA: Diagnosis not present

## 2020-12-14 DIAGNOSIS — I4891 Unspecified atrial fibrillation: Secondary | ICD-10-CM | POA: Diagnosis not present

## 2020-12-14 DIAGNOSIS — Z79899 Other long term (current) drug therapy: Secondary | ICD-10-CM

## 2020-12-14 DIAGNOSIS — Z515 Encounter for palliative care: Secondary | ICD-10-CM | POA: Diagnosis not present

## 2020-12-14 DIAGNOSIS — R112 Nausea with vomiting, unspecified: Secondary | ICD-10-CM | POA: Diagnosis not present

## 2020-12-14 DIAGNOSIS — G9341 Metabolic encephalopathy: Secondary | ICD-10-CM | POA: Diagnosis not present

## 2020-12-14 DIAGNOSIS — K56609 Unspecified intestinal obstruction, unspecified as to partial versus complete obstruction: Secondary | ICD-10-CM | POA: Diagnosis not present

## 2020-12-14 DIAGNOSIS — N261 Atrophy of kidney (terminal): Secondary | ICD-10-CM | POA: Diagnosis not present

## 2020-12-14 DIAGNOSIS — F419 Anxiety disorder, unspecified: Secondary | ICD-10-CM | POA: Diagnosis present

## 2020-12-14 DIAGNOSIS — Z87891 Personal history of nicotine dependence: Secondary | ICD-10-CM

## 2020-12-14 DIAGNOSIS — K562 Volvulus: Principal | ICD-10-CM | POA: Diagnosis present

## 2020-12-14 DIAGNOSIS — J810 Acute pulmonary edema: Secondary | ICD-10-CM | POA: Diagnosis not present

## 2020-12-14 DIAGNOSIS — Z9221 Personal history of antineoplastic chemotherapy: Secondary | ICD-10-CM

## 2020-12-14 DIAGNOSIS — M153 Secondary multiple arthritis: Secondary | ICD-10-CM | POA: Diagnosis present

## 2020-12-14 DIAGNOSIS — R110 Nausea: Secondary | ICD-10-CM | POA: Diagnosis not present

## 2020-12-14 DIAGNOSIS — J9621 Acute and chronic respiratory failure with hypoxia: Secondary | ICD-10-CM | POA: Diagnosis not present

## 2020-12-14 DIAGNOSIS — R1084 Generalized abdominal pain: Secondary | ICD-10-CM | POA: Diagnosis not present

## 2020-12-14 DIAGNOSIS — I517 Cardiomegaly: Secondary | ICD-10-CM | POA: Diagnosis not present

## 2020-12-14 DIAGNOSIS — R296 Repeated falls: Secondary | ICD-10-CM | POA: Diagnosis present

## 2020-12-14 DIAGNOSIS — K6389 Other specified diseases of intestine: Secondary | ICD-10-CM | POA: Diagnosis not present

## 2020-12-14 DIAGNOSIS — H409 Unspecified glaucoma: Secondary | ICD-10-CM | POA: Diagnosis present

## 2020-12-14 DIAGNOSIS — R140 Abdominal distension (gaseous): Secondary | ICD-10-CM | POA: Diagnosis not present

## 2020-12-14 DIAGNOSIS — Z7982 Long term (current) use of aspirin: Secondary | ICD-10-CM

## 2020-12-14 DIAGNOSIS — K660 Peritoneal adhesions (postprocedural) (postinfection): Secondary | ICD-10-CM | POA: Diagnosis present

## 2020-12-14 DIAGNOSIS — K3189 Other diseases of stomach and duodenum: Secondary | ICD-10-CM | POA: Diagnosis not present

## 2020-12-14 DIAGNOSIS — L89152 Pressure ulcer of sacral region, stage 2: Secondary | ICD-10-CM | POA: Diagnosis not present

## 2020-12-15 ENCOUNTER — Emergency Department (HOSPITAL_COMMUNITY): Payer: Medicare Other

## 2020-12-15 ENCOUNTER — Encounter (HOSPITAL_COMMUNITY): Payer: Self-pay | Admitting: Emergency Medicine

## 2020-12-15 ENCOUNTER — Inpatient Hospital Stay (HOSPITAL_COMMUNITY)
Admission: EM | Admit: 2020-12-15 | Discharge: 2021-01-20 | DRG: 329 | Disposition: E | Payer: Medicare Other | Attending: Family Medicine | Admitting: Family Medicine

## 2020-12-15 ENCOUNTER — Inpatient Hospital Stay (HOSPITAL_COMMUNITY): Payer: Medicare Other | Admitting: Anesthesiology

## 2020-12-15 ENCOUNTER — Encounter (HOSPITAL_COMMUNITY): Admission: EM | Disposition: E | Payer: Self-pay | Source: Home / Self Care | Attending: Family Medicine

## 2020-12-15 DIAGNOSIS — R112 Nausea with vomiting, unspecified: Secondary | ICD-10-CM | POA: Diagnosis not present

## 2020-12-15 DIAGNOSIS — K5909 Other constipation: Secondary | ICD-10-CM | POA: Diagnosis present

## 2020-12-15 DIAGNOSIS — Z87891 Personal history of nicotine dependence: Secondary | ICD-10-CM | POA: Diagnosis not present

## 2020-12-15 DIAGNOSIS — F419 Anxiety disorder, unspecified: Secondary | ICD-10-CM | POA: Diagnosis present

## 2020-12-15 DIAGNOSIS — M419 Scoliosis, unspecified: Secondary | ICD-10-CM | POA: Diagnosis present

## 2020-12-15 DIAGNOSIS — Z7189 Other specified counseling: Secondary | ICD-10-CM | POA: Diagnosis not present

## 2020-12-15 DIAGNOSIS — Z20822 Contact with and (suspected) exposure to covid-19: Secondary | ICD-10-CM | POA: Diagnosis not present

## 2020-12-15 DIAGNOSIS — R109 Unspecified abdominal pain: Secondary | ICD-10-CM | POA: Diagnosis not present

## 2020-12-15 DIAGNOSIS — J189 Pneumonia, unspecified organism: Secondary | ICD-10-CM

## 2020-12-15 DIAGNOSIS — R1310 Dysphagia, unspecified: Secondary | ICD-10-CM

## 2020-12-15 DIAGNOSIS — J9811 Atelectasis: Secondary | ICD-10-CM | POA: Diagnosis not present

## 2020-12-15 DIAGNOSIS — R140 Abdominal distension (gaseous): Secondary | ICD-10-CM

## 2020-12-15 DIAGNOSIS — R918 Other nonspecific abnormal finding of lung field: Secondary | ICD-10-CM | POA: Diagnosis not present

## 2020-12-15 DIAGNOSIS — I700 Atherosclerosis of aorta: Secondary | ICD-10-CM | POA: Diagnosis not present

## 2020-12-15 DIAGNOSIS — N183 Chronic kidney disease, stage 3 unspecified: Secondary | ICD-10-CM | POA: Diagnosis not present

## 2020-12-15 DIAGNOSIS — Z0189 Encounter for other specified special examinations: Secondary | ICD-10-CM

## 2020-12-15 DIAGNOSIS — K6389 Other specified diseases of intestine: Secondary | ICD-10-CM | POA: Diagnosis not present

## 2020-12-15 DIAGNOSIS — D6959 Other secondary thrombocytopenia: Secondary | ICD-10-CM | POA: Diagnosis not present

## 2020-12-15 DIAGNOSIS — Z515 Encounter for palliative care: Secondary | ICD-10-CM | POA: Diagnosis not present

## 2020-12-15 DIAGNOSIS — K56609 Unspecified intestinal obstruction, unspecified as to partial versus complete obstruction: Secondary | ICD-10-CM | POA: Diagnosis present

## 2020-12-15 DIAGNOSIS — I96 Gangrene, not elsewhere classified: Secondary | ICD-10-CM | POA: Diagnosis present

## 2020-12-15 DIAGNOSIS — J810 Acute pulmonary edema: Secondary | ICD-10-CM | POA: Diagnosis not present

## 2020-12-15 DIAGNOSIS — L8990 Pressure ulcer of unspecified site, unspecified stage: Secondary | ICD-10-CM | POA: Insufficient documentation

## 2020-12-15 DIAGNOSIS — J449 Chronic obstructive pulmonary disease, unspecified: Secondary | ICD-10-CM | POA: Diagnosis present

## 2020-12-15 DIAGNOSIS — M8949 Other hypertrophic osteoarthropathy, multiple sites: Secondary | ICD-10-CM | POA: Diagnosis present

## 2020-12-15 DIAGNOSIS — R4182 Altered mental status, unspecified: Secondary | ICD-10-CM | POA: Diagnosis not present

## 2020-12-15 DIAGNOSIS — Z9181 History of falling: Secondary | ICD-10-CM

## 2020-12-15 DIAGNOSIS — N179 Acute kidney failure, unspecified: Secondary | ICD-10-CM | POA: Diagnosis not present

## 2020-12-15 DIAGNOSIS — Z833 Family history of diabetes mellitus: Secondary | ICD-10-CM | POA: Diagnosis not present

## 2020-12-15 DIAGNOSIS — I10 Essential (primary) hypertension: Secondary | ICD-10-CM | POA: Diagnosis present

## 2020-12-15 DIAGNOSIS — I517 Cardiomegaly: Secondary | ICD-10-CM | POA: Diagnosis not present

## 2020-12-15 DIAGNOSIS — J690 Pneumonitis due to inhalation of food and vomit: Secondary | ICD-10-CM | POA: Diagnosis not present

## 2020-12-15 DIAGNOSIS — Z8719 Personal history of other diseases of the digestive system: Secondary | ICD-10-CM

## 2020-12-15 DIAGNOSIS — R0600 Dyspnea, unspecified: Secondary | ICD-10-CM

## 2020-12-15 DIAGNOSIS — Z8249 Family history of ischemic heart disease and other diseases of the circulatory system: Secondary | ICD-10-CM | POA: Diagnosis not present

## 2020-12-15 DIAGNOSIS — G9341 Metabolic encephalopathy: Secondary | ICD-10-CM | POA: Diagnosis not present

## 2020-12-15 DIAGNOSIS — R410 Disorientation, unspecified: Secondary | ICD-10-CM | POA: Diagnosis not present

## 2020-12-15 DIAGNOSIS — K55029 Acute infarction of small intestine, extent unspecified: Secondary | ICD-10-CM | POA: Diagnosis not present

## 2020-12-15 DIAGNOSIS — J90 Pleural effusion, not elsewhere classified: Secondary | ICD-10-CM

## 2020-12-15 DIAGNOSIS — H409 Unspecified glaucoma: Secondary | ICD-10-CM | POA: Diagnosis present

## 2020-12-15 DIAGNOSIS — R0602 Shortness of breath: Secondary | ICD-10-CM

## 2020-12-15 DIAGNOSIS — J439 Emphysema, unspecified: Secondary | ICD-10-CM | POA: Diagnosis not present

## 2020-12-15 DIAGNOSIS — K8689 Other specified diseases of pancreas: Secondary | ICD-10-CM | POA: Diagnosis not present

## 2020-12-15 DIAGNOSIS — E43 Unspecified severe protein-calorie malnutrition: Secondary | ICD-10-CM | POA: Diagnosis present

## 2020-12-15 DIAGNOSIS — I129 Hypertensive chronic kidney disease with stage 1 through stage 4 chronic kidney disease, or unspecified chronic kidney disease: Secondary | ICD-10-CM | POA: Diagnosis present

## 2020-12-15 DIAGNOSIS — L89152 Pressure ulcer of sacral region, stage 2: Secondary | ICD-10-CM | POA: Diagnosis not present

## 2020-12-15 DIAGNOSIS — N261 Atrophy of kidney (terminal): Secondary | ICD-10-CM | POA: Diagnosis not present

## 2020-12-15 DIAGNOSIS — J9690 Respiratory failure, unspecified, unspecified whether with hypoxia or hypercapnia: Secondary | ICD-10-CM | POA: Diagnosis not present

## 2020-12-15 DIAGNOSIS — K559 Vascular disorder of intestine, unspecified: Secondary | ICD-10-CM | POA: Diagnosis not present

## 2020-12-15 DIAGNOSIS — I739 Peripheral vascular disease, unspecified: Secondary | ICD-10-CM | POA: Diagnosis not present

## 2020-12-15 DIAGNOSIS — J9621 Acute and chronic respiratory failure with hypoxia: Secondary | ICD-10-CM | POA: Diagnosis not present

## 2020-12-15 DIAGNOSIS — K5669 Other partial intestinal obstruction: Secondary | ICD-10-CM | POA: Diagnosis not present

## 2020-12-15 DIAGNOSIS — Z66 Do not resuscitate: Secondary | ICD-10-CM | POA: Diagnosis not present

## 2020-12-15 DIAGNOSIS — K562 Volvulus: Secondary | ICD-10-CM

## 2020-12-15 DIAGNOSIS — D62 Acute posthemorrhagic anemia: Secondary | ICD-10-CM | POA: Diagnosis not present

## 2020-12-15 DIAGNOSIS — Z681 Body mass index (BMI) 19 or less, adult: Secondary | ICD-10-CM | POA: Diagnosis not present

## 2020-12-15 DIAGNOSIS — M159 Polyosteoarthritis, unspecified: Secondary | ICD-10-CM | POA: Diagnosis present

## 2020-12-15 DIAGNOSIS — J80 Acute respiratory distress syndrome: Secondary | ICD-10-CM | POA: Diagnosis not present

## 2020-12-15 DIAGNOSIS — Z4682 Encounter for fitting and adjustment of non-vascular catheter: Secondary | ICD-10-CM | POA: Diagnosis not present

## 2020-12-15 DIAGNOSIS — Q676 Pectus excavatum: Secondary | ICD-10-CM | POA: Diagnosis not present

## 2020-12-15 DIAGNOSIS — G911 Obstructive hydrocephalus: Secondary | ICD-10-CM | POA: Diagnosis not present

## 2020-12-15 DIAGNOSIS — K56699 Other intestinal obstruction unspecified as to partial versus complete obstruction: Secondary | ICD-10-CM | POA: Diagnosis not present

## 2020-12-15 DIAGNOSIS — N3941 Urge incontinence: Secondary | ICD-10-CM | POA: Diagnosis present

## 2020-12-15 DIAGNOSIS — N1832 Chronic kidney disease, stage 3b: Secondary | ICD-10-CM | POA: Diagnosis present

## 2020-12-15 HISTORY — PX: LAPAROTOMY: SHX154

## 2020-12-15 LAB — LACTIC ACID, PLASMA
Lactic Acid, Venous: 3.1 mmol/L (ref 0.5–1.9)
Lactic Acid, Venous: 4.9 mmol/L (ref 0.5–1.9)
Lactic Acid, Venous: 3.7 mmol/L (ref 0.5–1.9)
Lactic Acid, Venous: 5.3 mmol/L (ref 0.5–1.9)

## 2020-12-15 LAB — COMPREHENSIVE METABOLIC PANEL
ALT: 13 U/L (ref 0–44)
AST: 25 U/L (ref 15–41)
Albumin: 4.3 g/dL (ref 3.5–5.0)
Alkaline Phosphatase: 147 U/L — ABNORMAL HIGH (ref 38–126)
Anion gap: 12 (ref 5–15)
BUN: 15 mg/dL (ref 8–23)
CO2: 31 mmol/L (ref 22–32)
Calcium: 9.9 mg/dL (ref 8.9–10.3)
Chloride: 94 mmol/L — ABNORMAL LOW (ref 98–111)
Creatinine, Ser: 1.12 mg/dL — ABNORMAL HIGH (ref 0.44–1.00)
GFR, Estimated: 48 mL/min — ABNORMAL LOW (ref >60)
Glucose, Bld: 166 mg/dL — ABNORMAL HIGH (ref 70–99)
Potassium: 4.4 mmol/L (ref 3.5–5.1)
Sodium: 137 mmol/L (ref 135–145)
Total Bilirubin: 0.7 mg/dL (ref 0.3–1.2)
Total Protein: 8.1 g/dL (ref 6.5–8.1)

## 2020-12-15 LAB — CBC WITH DIFFERENTIAL/PLATELET
Abs Immature Granulocytes: 0.02 10*3/uL (ref 0.00–0.07)
Basophils Absolute: 0 10*3/uL (ref 0.0–0.1)
Basophils Relative: 0 %
Eosinophils Absolute: 0 10*3/uL (ref 0.0–0.5)
Eosinophils Relative: 0 %
HCT: 48.9 % — ABNORMAL HIGH (ref 36.0–46.0)
Hemoglobin: 15.1 g/dL — ABNORMAL HIGH (ref 12.0–15.0)
Immature Granulocytes: 0 %
Lymphocytes Relative: 6 %
Lymphs Abs: 0.5 10*3/uL — ABNORMAL LOW (ref 0.7–4.0)
MCH: 28.8 pg (ref 26.0–34.0)
MCHC: 30.9 g/dL (ref 30.0–36.0)
MCV: 93.1 fL (ref 80.0–100.0)
Monocytes Absolute: 0.4 10*3/uL (ref 0.1–1.0)
Monocytes Relative: 4 %
Neutro Abs: 7.8 10*3/uL — ABNORMAL HIGH (ref 1.7–7.7)
Neutrophils Relative %: 90 %
Platelets: 185 10*3/uL (ref 150–400)
RBC: 5.25 MIL/uL — ABNORMAL HIGH (ref 3.87–5.11)
RDW: 13.2 % (ref 11.5–15.5)
WBC: 8.8 10*3/uL (ref 4.0–10.5)
nRBC: 0 % (ref 0.0–0.2)

## 2020-12-15 LAB — RESP PANEL BY RT-PCR (FLU A&B, COVID) ARPGX2
Influenza A by PCR: NEGATIVE
Influenza B by PCR: NEGATIVE
SARS Coronavirus 2 by RT PCR: NEGATIVE

## 2020-12-15 LAB — SURGICAL PCR SCREEN
MRSA, PCR: NEGATIVE
Staphylococcus aureus: NEGATIVE

## 2020-12-15 LAB — LIPASE, BLOOD: Lipase: 23 U/L (ref 11–51)

## 2020-12-15 LAB — PREALBUMIN: Prealbumin: 18.4 mg/dL (ref 18–38)

## 2020-12-15 SURGERY — LAPAROTOMY, EXPLORATORY
Anesthesia: General | Site: Abdomen

## 2020-12-15 MED ORDER — FENTANYL CITRATE (PF) 100 MCG/2ML IJ SOLN
INTRAMUSCULAR | Status: DC | PRN
  Administered 2020-12-15: 25 ug via INTRAVENOUS

## 2020-12-15 MED ORDER — ONDANSETRON HCL 4 MG/2ML IJ SOLN
4.0000 mg | Freq: Once | INTRAMUSCULAR | Status: AC
  Administered 2020-12-15: 4 mg via INTRAVENOUS
  Filled 2020-12-15: qty 2

## 2020-12-15 MED ORDER — LIDOCAINE 2% (20 MG/ML) 5 ML SYRINGE
INTRAMUSCULAR | Status: DC | PRN
  Administered 2020-12-15: 40 mg via INTRAVENOUS

## 2020-12-15 MED ORDER — DEXAMETHASONE SODIUM PHOSPHATE 10 MG/ML IJ SOLN
INTRAMUSCULAR | Status: DC | PRN
  Administered 2020-12-15: 5 mg via INTRAVENOUS

## 2020-12-15 MED ORDER — ACETAMINOPHEN 325 MG PO TABS
650.0000 mg | ORAL_TABLET | Freq: Four times a day (QID) | ORAL | Status: DC | PRN
Start: 2020-12-15 — End: 2020-12-26
  Administered 2020-12-20 – 2020-12-25 (×6): 650 mg via ORAL
  Filled 2020-12-15 (×7): qty 2

## 2020-12-15 MED ORDER — CHLORHEXIDINE GLUCONATE CLOTH 2 % EX PADS: 6.0000 | MEDICATED_PAD | Freq: Once | CUTANEOUS | Status: DC

## 2020-12-15 MED ORDER — METRONIDAZOLE 500 MG/100ML IV SOLN
500.0000 mg | Freq: Three times a day (TID) | INTRAVENOUS | Status: DC
  Administered 2020-12-15 – 2020-12-20 (×16): 500 mg via INTRAVENOUS
  Filled 2020-12-15 (×16): qty 100

## 2020-12-15 MED ORDER — LACTATED RINGERS IV BOLUS
1000.0000 mL | Freq: Once | INTRAVENOUS | Status: AC
  Administered 2020-12-15: 1000 mL via INTRAVENOUS

## 2020-12-15 MED ORDER — METHOCARBAMOL 1000 MG/10ML IJ SOLN
1000.0000 mg | Freq: Four times a day (QID) | INTRAVENOUS | Status: DC | PRN
  Administered 2020-12-18: 1000 mg via INTRAVENOUS
  Filled 2020-12-15: qty 1000
  Filled 2020-12-15: qty 10

## 2020-12-15 MED ORDER — 0.9 % SODIUM CHLORIDE (POUR BTL) OPTIME
TOPICAL | Status: DC | PRN
  Administered 2020-12-15: 1000 mL

## 2020-12-15 MED ORDER — CHLORHEXIDINE GLUCONATE CLOTH 2 % EX PADS
6.0000 | MEDICATED_PAD | Freq: Every day | CUTANEOUS | Status: DC
  Administered 2020-12-15 – 2020-12-18 (×3): 6 via TOPICAL

## 2020-12-15 MED ORDER — FENTANYL CITRATE (PF) 100 MCG/2ML IJ SOLN
25.0000 ug | INTRAMUSCULAR | Status: DC | PRN
Start: 2020-12-15 — End: 2020-12-17

## 2020-12-15 MED ORDER — FENTANYL CITRATE (PF) 100 MCG/2ML IJ SOLN
INTRAMUSCULAR | Status: AC
  Administered 2020-12-15: 25 ug via INTRAVENOUS
  Filled 2020-12-15: qty 2

## 2020-12-15 MED ORDER — ACETAMINOPHEN 10 MG/ML IV SOLN
INTRAVENOUS | Status: DC | PRN
  Administered 2020-12-15: 534 mg via INTRAVENOUS

## 2020-12-15 MED ORDER — ACETAMINOPHEN 10 MG/ML IV SOLN
INTRAVENOUS | Status: AC
  Filled 2020-12-15: qty 100

## 2020-12-15 MED ORDER — ROCURONIUM BROMIDE 10 MG/ML (PF) SYRINGE
PREFILLED_SYRINGE | INTRAVENOUS | Status: DC | PRN
  Administered 2020-12-15: 10 mg via INTRAVENOUS
  Administered 2020-12-15: 30 mg via INTRAVENOUS
  Administered 2020-12-15: 10 mg via INTRAVENOUS

## 2020-12-15 MED ORDER — ALBUTEROL SULFATE (2.5 MG/3ML) 0.083% IN NEBU: 2.5000 mg | INHALATION_SOLUTION | Freq: Four times a day (QID) | RESPIRATORY_TRACT | Status: DC | PRN

## 2020-12-15 MED ORDER — PHENYLEPHRINE HCL-NACL 10-0.9 MG/250ML-% IV SOLN
INTRAVENOUS | Status: DC | PRN
  Administered 2020-12-15: 30 ug/min via INTRAVENOUS

## 2020-12-15 MED ORDER — SODIUM CHLORIDE 0.9 % IV SOLN
8.0000 mg | Freq: Four times a day (QID) | INTRAVENOUS | Status: DC | PRN
  Filled 2020-12-15: qty 4

## 2020-12-15 MED ORDER — BISACODYL 10 MG RE SUPP
10.0000 mg | Freq: Every day | RECTAL | Status: DC
  Administered 2020-12-16 – 2020-12-25 (×7): 10 mg via RECTAL
  Filled 2020-12-15 (×7): qty 1

## 2020-12-15 MED ORDER — LACTATED RINGERS IV SOLN: INTRAVENOUS | Status: DC

## 2020-12-15 MED ORDER — MORPHINE SULFATE (PF) 2 MG/ML IV SOLN
2.0000 mg | Freq: Once | INTRAVENOUS | Status: AC
  Administered 2020-12-15: 2 mg via INTRAVENOUS
  Filled 2020-12-15: qty 1

## 2020-12-15 MED ORDER — PHENOL 1.4 % MT LIQD
2.0000 | OROMUCOSAL | Status: DC | PRN
  Filled 2020-12-15 (×2): qty 177

## 2020-12-15 MED ORDER — ALBUTEROL SULFATE HFA 108 (90 BASE) MCG/ACT IN AERS: 2.0000 | INHALATION_SPRAY | Freq: Four times a day (QID) | RESPIRATORY_TRACT | Status: DC | PRN

## 2020-12-15 MED ORDER — PROPOFOL 10 MG/ML IV BOLUS
INTRAVENOUS | Status: DC | PRN
  Administered 2020-12-15: 60 mg via INTRAVENOUS

## 2020-12-15 MED ORDER — ONDANSETRON HCL 4 MG/2ML IJ SOLN
4.0000 mg | Freq: Four times a day (QID) | INTRAMUSCULAR | Status: DC | PRN
  Administered 2020-12-19 – 2020-12-23 (×2): 4 mg via INTRAVENOUS
  Filled 2020-12-15 (×2): qty 2

## 2020-12-15 MED ORDER — ACETAMINOPHEN 650 MG RE SUPP: 650.0000 mg | Freq: Four times a day (QID) | RECTAL | Status: DC | PRN

## 2020-12-15 MED ORDER — IOHEXOL 300 MG/ML  SOLN
80.0000 mL | Freq: Once | INTRAMUSCULAR | Status: AC | PRN
  Administered 2020-12-15: 80 mL via INTRAVENOUS

## 2020-12-15 MED ORDER — FENTANYL CITRATE (PF) 100 MCG/2ML IJ SOLN: 25.0000 ug | INTRAMUSCULAR | Status: DC | PRN

## 2020-12-15 MED ORDER — SODIUM CHLORIDE (PF) 0.9 % IJ SOLN
INTRAMUSCULAR | Status: AC
  Filled 2020-12-15: qty 50

## 2020-12-15 MED ORDER — BRIMONIDINE TARTRATE 0.15 % OP SOLN
1.0000 [drp] | Freq: Two times a day (BID) | OPHTHALMIC | Status: DC
  Administered 2020-12-16 – 2020-12-25 (×20): 1 [drp] via OPHTHALMIC
  Filled 2020-12-15 (×2): qty 5

## 2020-12-15 MED ORDER — PROCHLORPERAZINE EDISYLATE 10 MG/2ML IJ SOLN: 5.0000 mg | INTRAMUSCULAR | Status: DC | PRN

## 2020-12-15 MED ORDER — SERTRALINE HCL 50 MG PO TABS: 25.0000 mg | ORAL_TABLET | Freq: Every day | ORAL | Status: DC

## 2020-12-15 MED ORDER — PHENYLEPHRINE HCL (PRESSORS) 10 MG/ML IV SOLN
INTRAVENOUS | Status: AC
  Filled 2020-12-15: qty 1

## 2020-12-15 MED ORDER — ONDANSETRON HCL 4 MG/2ML IJ SOLN: 4.0000 mg | Freq: Four times a day (QID) | INTRAMUSCULAR | Status: DC | PRN

## 2020-12-15 MED ORDER — MENTHOL 3 MG MT LOZG
1.0000 | LOZENGE | OROMUCOSAL | Status: DC | PRN
  Filled 2020-12-15: qty 9

## 2020-12-15 MED ORDER — ONDANSETRON HCL 4 MG PO TABS: 4.0000 mg | ORAL_TABLET | Freq: Four times a day (QID) | ORAL | Status: DC | PRN

## 2020-12-15 MED ORDER — ONDANSETRON HCL 4 MG/2ML IJ SOLN
INTRAMUSCULAR | Status: DC | PRN
  Administered 2020-12-15: 4 mg via INTRAVENOUS

## 2020-12-15 MED ORDER — MORPHINE SULFATE (PF) 2 MG/ML IV SOLN: 2.0000 mg | INTRAVENOUS | Status: DC | PRN

## 2020-12-15 MED ORDER — FENTANYL CITRATE (PF) 100 MCG/2ML IJ SOLN
INTRAMUSCULAR | Status: AC
  Filled 2020-12-15: qty 2

## 2020-12-15 MED ORDER — ALUM & MAG HYDROXIDE-SIMETH 200-200-20 MG/5ML PO SUSP
30.0000 mL | Freq: Four times a day (QID) | ORAL | Status: DC | PRN
  Administered 2020-12-19 – 2020-12-24 (×3): 30 mL via ORAL
  Filled 2020-12-15 (×3): qty 30

## 2020-12-15 MED ORDER — LACTATED RINGERS IV BOLUS: 1000.0000 mL | Freq: Three times a day (TID) | INTRAVENOUS | Status: AC | PRN

## 2020-12-15 MED ORDER — LIP MEDEX EX OINT
1.0000 "application " | TOPICAL_OINTMENT | Freq: Two times a day (BID) | CUTANEOUS | Status: DC
  Administered 2020-12-15 – 2020-12-25 (×19): 1 via TOPICAL
  Filled 2020-12-15 (×4): qty 7

## 2020-12-15 MED ORDER — AMLODIPINE BESYLATE 5 MG PO TABS
7.5000 mg | ORAL_TABLET | Freq: Every day | ORAL | Status: DC
  Administered 2020-12-19: 7.5 mg via ORAL
  Filled 2020-12-15 (×2): qty 2

## 2020-12-15 MED ORDER — MORPHINE SULFATE (PF) 2 MG/ML IV SOLN
1.0000 mg | INTRAVENOUS | Status: DC | PRN
  Administered 2020-12-15 – 2020-12-17 (×6): 1 mg via INTRAVENOUS
  Filled 2020-12-15 (×6): qty 1

## 2020-12-15 MED ORDER — ALBUMIN HUMAN 5 % IV SOLN: INTRAVENOUS | Status: DC | PRN

## 2020-12-15 MED ORDER — MAGIC MOUTHWASH
15.0000 mL | Freq: Four times a day (QID) | ORAL | Status: DC | PRN
  Filled 2020-12-15: qty 15

## 2020-12-15 MED ORDER — DORZOLAMIDE HCL 2 % OP SOLN
1.0000 [drp] | Freq: Two times a day (BID) | OPHTHALMIC | Status: DC
  Administered 2020-12-16 – 2020-12-24 (×17): 1 [drp] via OPHTHALMIC
  Filled 2020-12-15 (×2): qty 10

## 2020-12-15 MED ORDER — HYDRALAZINE HCL 20 MG/ML IJ SOLN: 10.0000 mg | Freq: Three times a day (TID) | INTRAMUSCULAR | Status: DC | PRN

## 2020-12-15 MED ORDER — SODIUM CHLORIDE 0.9 % IV SOLN
2.0000 g | INTRAVENOUS | Status: AC
  Administered 2020-12-15: 2 g via INTRAVENOUS
  Filled 2020-12-15: qty 2

## 2020-12-15 MED ORDER — SUGAMMADEX SODIUM 200 MG/2ML IV SOLN
INTRAVENOUS | Status: DC | PRN
  Administered 2020-12-15: 100 mg via INTRAVENOUS

## 2020-12-15 MED ORDER — SIMETHICONE 40 MG/0.6ML PO SUSP
80.0000 mg | Freq: Four times a day (QID) | ORAL | Status: DC | PRN
  Filled 2020-12-15 (×2): qty 1.2

## 2020-12-15 SURGICAL SUPPLY — 56 items
BAG COUNTER SPONGE SURGICOUNT (BAG) IMPLANT
BAG SPNG CNTER NS LX DISP (BAG)
BLADE EXTENDED COATED 6.5IN (ELECTRODE) IMPLANT
BLADE HEX COATED 2.75 (ELECTRODE) IMPLANT
CELLS DAT CNTRL 66122 CELL SVR (MISCELLANEOUS) ×1 IMPLANT
CHLORAPREP W/TINT 26 (MISCELLANEOUS) ×2 IMPLANT
COUNTER NEEDLE 20 DBL MAG RED (NEEDLE) ×2 IMPLANT
COVER MAYO STAND STRL (DRAPES) ×2 IMPLANT
COVER SURGICAL LIGHT HANDLE (MISCELLANEOUS) ×2 IMPLANT
DRAIN CHANNEL 19F RND (DRAIN) IMPLANT
DRAPE LAPAROSCOPIC ABDOMINAL (DRAPES) ×2 IMPLANT
DRAPE SHEET LG 3/4 BI-LAMINATE (DRAPES) ×2 IMPLANT
DRAPE UTILITY XL STRL (DRAPES) ×4 IMPLANT
DRAPE WARM FLUID 44X44 (DRAPES) ×2 IMPLANT
DRSG OPSITE POSTOP 4X10 (GAUZE/BANDAGES/DRESSINGS) IMPLANT
DRSG OPSITE POSTOP 4X6 (GAUZE/BANDAGES/DRESSINGS) IMPLANT
DRSG OPSITE POSTOP 4X8 (GAUZE/BANDAGES/DRESSINGS) ×2 IMPLANT
ELECT REM PT RETURN 15FT ADLT (MISCELLANEOUS) ×2 IMPLANT
GAUZE SPONGE 4X4 12PLY STRL (GAUZE/BANDAGES/DRESSINGS) ×2 IMPLANT
GLOVE SURG LTX SZ8 (GLOVE) ×4 IMPLANT
GLOVE SURG UNDER LTX SZ8 (GLOVE) ×4 IMPLANT
GOWN STRL REUS W/TWL XL LVL3 (GOWN DISPOSABLE) ×4 IMPLANT
HANDLE SUCTION POOLE (INSTRUMENTS) ×1 IMPLANT
KIT BASIN OR (CUSTOM PROCEDURE TRAY) ×2 IMPLANT
KIT TURNOVER KIT A (KITS) ×2 IMPLANT
LEGGING LITHOTOMY PAIR STRL (DRAPES) IMPLANT
LIGASURE IMPACT 36 18CM CVD LR (INSTRUMENTS) ×2 IMPLANT
PACK GENERAL/GYN (CUSTOM PROCEDURE TRAY) ×2 IMPLANT
PAD POSITIONING PINK XL (MISCELLANEOUS) ×2 IMPLANT
PENCIL SMOKE EVACUATOR (MISCELLANEOUS) IMPLANT
RELOAD PROXIMATE 75MM BLUE (ENDOMECHANICALS) ×2 IMPLANT
RETRACTOR WND ALEXIS 25 LRG (MISCELLANEOUS) ×1 IMPLANT
RTRCTR WOUND ALEXIS 18CM MED (MISCELLANEOUS) ×2
RTRCTR WOUND ALEXIS 25CM LRG (MISCELLANEOUS) ×2
STAPLER 90 3.5 STAND SLIM (STAPLE) ×2
STAPLER 90 3.5 STD SLIM (STAPLE) ×1 IMPLANT
STAPLER PROXIMATE 75MM BLUE (STAPLE) ×2 IMPLANT
STAPLER VISISTAT 35W (STAPLE) ×2 IMPLANT
SUCTION POOLE HANDLE (INSTRUMENTS) ×2
SUT MNCRL AB 4-0 PS2 18 (SUTURE) IMPLANT
SUT PDS AB 1 CTX 36 (SUTURE) IMPLANT
SUT PDS AB 1 TP1 96 (SUTURE) IMPLANT
SUT PROLENE 2 0 SH DA (SUTURE) IMPLANT
SUT SILK 0 (SUTURE)
SUT SILK 0 30XBRD TIE 6 (SUTURE) IMPLANT
SUT SILK 2 0 (SUTURE) ×2
SUT SILK 2 0 SH CR/8 (SUTURE) ×4 IMPLANT
SUT SILK 2-0 18XBRD TIE 12 (SUTURE) ×1 IMPLANT
SUT SILK 3 0 (SUTURE) ×2
SUT SILK 3 0 SH CR/8 (SUTURE) ×2 IMPLANT
SUT SILK 3-0 18XBRD TIE 12 (SUTURE) ×1 IMPLANT
SUT VICRYL 0 UR6 27IN ABS (SUTURE) IMPLANT
TAPE UMBILICAL 1/8 X36 TWILL (MISCELLANEOUS) ×2 IMPLANT
TOWEL OR 17X26 10 PK STRL BLUE (TOWEL DISPOSABLE) ×4 IMPLANT
TOWEL OR NON WOVEN STRL DISP B (DISPOSABLE) ×4 IMPLANT
TRAY FOLEY MTR SLVR 16FR STAT (SET/KITS/TRAYS/PACK) ×2 IMPLANT

## 2020-12-15 NOTE — Care Plan (Signed)
This 87 years old female with PMH significant for prior small bowel obstruction requiring lysis of adhesions in 2013, severe malnutrition, hypertension, CKD stage III presented in the ED with complaints of severe abdominal pain, distention associated with nausea and vomiting and symptoms has been ongoing for last 2 days.  She has no bowel movement in several days.  CT abdomen consistent with high-grade small bowel obstruction with transition point in the right lower quadrant. Patient is admitted for small bowel obstruction, NG tube was inserted,  shows coffee-ground emesis.  General surgery was consulted, Patient is scheduled to have exploratory laparotomy today..  Patient was seen at bedside.

## 2020-12-15 NOTE — ED Notes (Addendum)
Patient is not wearing oxygen and O2 Sat -100% on room air.

## 2020-12-15 NOTE — Anesthesia Preprocedure Evaluation (Addendum)
Anesthesia Evaluation  Patient identified by MRN, date of birth, ID band Patient awake    Reviewed: Allergy & Precautions, NPO status , Patient's Chart, lab work & pertinent test results  Airway Mallampati: II  TM Distance: >3 FB Neck ROM: Full    Dental  (+) Dental Advisory Given   Pulmonary COPD,  COPD inhaler, former smoker,     + decreased breath sounds      Cardiovascular hypertension, Pt. on medications + Peripheral Vascular Disease   Rhythm:Regular Rate:Normal     Neuro/Psych Anxiety  Neuromuscular disease    GI/Hepatic negative GI ROS, Neg liver ROS,   Endo/Other  negative endocrine ROS  Renal/GU Renal InsufficiencyRenal disease     Musculoskeletal  (+) Arthritis ,   Abdominal Normal abdominal exam  (+)   Peds  Hematology negative hematology ROS (+)   Anesthesia Other Findings NGT in place  Reproductive/Obstetrics                            Anesthesia Physical Anesthesia Plan  ASA: 3 and emergent  Anesthesia Plan: General   Post-op Pain Management:    Induction: Intravenous  PONV Risk Score and Plan: 4 or greater and Ondansetron, Dexamethasone and Treatment may vary due to age or medical condition  Airway Management Planned: Oral ETT  Additional Equipment: None  Intra-op Plan:   Post-operative Plan: Extubation in OR  Informed Consent: I have reviewed the patients History and Physical, chart, labs and discussed the procedure including the risks, benefits and alternatives for the proposed anesthesia with the patient or authorized representative who has indicated his/her understanding and acceptance.     Dental advisory given  Plan Discussed with: CRNA  Anesthesia Plan Comments:        Anesthesia Quick Evaluation

## 2020-12-15 NOTE — H&P (Signed)
History and Physical    Amber Rowland XLK:[id number] DOB: 02/09/1934 DOA: 2021/01/14  PCP: Marcine Matar, MD  Patient coming from: Home  I have personally briefly reviewed patient's old medical records in [facility]  Chief Complaint: "Small bowel obstruction"  HPI: Amber Rowland is a 87 y.o. female with medical history significant of prior SBO requiring lysis of adhesion in 2013, severe malnutrition, HTN, CKD 3.  Pt presents to the ED with c/o severe abd pain, distention, N/V.  Symptoms ongoing for past 2 days.  Symptoms are similar to last time she had SBO so she thinks (correctly) that she has SBO again.  Nothing makes symptoms better.  No BM for several days.  No fevers, chills, SOB.   ED Course: Pt does indeed have high grade SBO with transition point in an area of strictured / edematous small bowel.  NGT in place.   Review of Systems: As per HPI, otherwise all review of systems negative.  Past Medical History:  Diagnosis Date   Atrophy of left kidney    Chronic atrophy of the left kidney.   Benign breast cysts in female 04/12/2012   Seen on mammography 2009    Chronic constipation    COPD (chronic obstructive pulmonary disease) (HCC)    Hyperinflation and heavy smoking history strongly   Ejection fraction    Esophageal dysmotility 02/14/2015   Glaucoma (increased eye pressure)    HTN (hypertension) 04/12/2012   Ileus following gastrointestinal surgery (HCC) 04/28/2012   Malnutrition (HCC) 04/28/2012   Ovarian cyst, right 2009   s/p BSO 2009: OVARIAN FIBROTHECOMA in multiple fluid filled cysts  4.5cm region   SBO (small bowel obstruction) (HCC)    Severe malnutrition (HCC) 09/16/2013   Stage III chronic kidney disease (HCC) 02/13/2015   UTI (lower urinary tract infection)    History of chronic recurrent UTIs    Past Surgical History:  Procedure Laterality Date   BILATERAL OOPHORECTOMY  2009   PROCEDURE:  diagnostic laparoscopy, laparotomy  with bilateral salpingo-   LAPAROTOMY  04/23/2012   Procedure: EXPLORATORY LAPAROTOMY;  Surgeon: Valarie Merino, MD;  Location: [facility];  Service: General;  Laterality: N/A;  enterolysis      reports that she quit smoking about 6 years ago. Her smoking use included cigarettes. She has a 48.00 pack-year smoking history. She has never used smokeless tobacco. She reports that she does not drink alcohol and does not use drugs.  Allergies  Allergen Reactions   Advair Hfa [Fluticasone-Salmeterol]     Did not tolerate   Symbicort [Budesonide-Formoterol Fumarate]     chet pain    Family History  Problem Relation Age of Onset   Diabetes Son    High blood pressure Son    Coronary artery disease Son      Prior to Admission medications   Medication Sig Start Date End Date Taking? Authorizing Provider  albuterol (VENTOLIN HFA) 108 (90 Base) MCG/ACT inhaler Inhale 2 puffs into the lungs every 6 (six) hours as needed for wheezing or shortness of breath. 02/27/20  Yes Marcine Matar, MD  amLODipine (NORVASC) 5 MG tablet TAKE 1.5 TABLETS BY MOUTH DAILY. 08/11/20  Yes Marcine Matar, MD  aspirin EC 81 MG tablet Take 81-162 mg by mouth daily as needed. Swallow whole.   Yes [provider]  brimonidine (ALPHAGAN) 0.15 % ophthalmic solution Place 1 drop into both eyes 2 (two) times daily.  12/11/19  Yes [provider]  dorzolamide (  TRUSOPT) 2 % ophthalmic solution Place 1 drop into both eyes 2 (two) times daily. 12/11/20  Yes [provider]  Elastic Bandages & Supports (MEDICAL COMPRESSION STOCKINGS) MISC Use as directed. Diagnosis: bilateral lower extremity edema R22.43. Please fit as appropriate. 08/07/20  Yes Hagler, Arlys JohnBrian, MD  furosemide (LASIX) 20 MG tablet Take 1-2 tablets every morning to help with the swelling in your legs. 08/07/20  Yes Hagler, Arlys JohnBrian, MD  tolnaftate (TINACTIN) 1 % cream Apply 1 application topically 2 (two) times daily. Apply to feet twice daily  12/16/19  Yes Pfeiffer, Lebron ConnersMarcy, MD  sertraline (ZOLOFT) 50 MG tablet TAKE 1/2 TABLET BY MOUTH DAILY Patient not taking: Reported on 11-18-2020 07/11/20   Marcine MatarJohnson, Deborah B, MD    Physical Exam: Vitals:   Jan 28, 2021 0115 Jan 28, 2021 0215 Jan 28, 2021 0330 Jan 28, 2021 0509  BP: (!) 141/82 133/90 (!) 154/85 (!) 113/93  Pulse: 78 78 93 91  Resp: 17 16 16 16   Temp:      TempSrc:      SpO2: 94% 92% 95% 97%  Weight:      Height:        Constitutional: NAD, calm, comfortable Eyes: PERRL, lids and conjunctivae normal ENMT: Mucous membranes are moist. Posterior pharynx clear of any exudate or lesions.Normal dentition.  Neck: normal, supple, no masses, no thyromegaly Respiratory: clear to auscultation bilaterally, no wheezing, no crackles. Normal respiratory effort. No accessory muscle use.  Cardiovascular: Regular rate and rhythm, no murmurs / rubs / gallops. No extremity edema. 2+ pedal pulses. No carotid bruits.  Abdomen: Tympanitic and distended Musculoskeletal: no clubbing / cyanosis. No joint deformity upper and lower extremities. Good ROM, no contractures. Normal muscle tone.  Skin: no rashes, lesions, ulcers. No induration Neurologic: CN 2-12 grossly intact. Sensation intact, DTR normal. Strength 5/5 in all 4.  Psychiatric: Normal judgment and insight. Alert and oriented x 3. Normal mood.    Labs on Admission: I have personally reviewed following labs and imaging studies  CBC: Recent Labs  Lab Jan 28, 2021 0004  WBC 8.8  NEUTROABS 7.8*  HGB 15.1*  HCT 48.9*  MCV 93.1  PLT 185   Basic Metabolic Panel: Recent Labs  Lab Jan 28, 2021 0004  NA 137  K 4.4  CL 94*  CO2 31  GLUCOSE 166*  BUN 15  CREATININE 1.12*  CALCIUM 9.9   GFR: Estimated Creatinine Clearance: 21.5 mL/min (A) (by C-G formula based on SCr of 1.12 mg/dL (H)). Liver Function Tests: Recent Labs  Lab Jan 28, 2021 0004  AST 25  ALT 13  ALKPHOS 147*  BILITOT 0.7  PROT 8.1  ALBUMIN 4.3   Recent Labs  Lab Jan 28, 2021 0004   LIPASE 23   No results for input(s): AMMONIA in the last 168 hours. Coagulation Profile: No results for input(s): INR, PROTIME in the last 168 hours. Cardiac Enzymes: No results for input(s): CKTOTAL, CKMB, CKMBINDEX, TROPONINI in the last 168 hours. BNP (last 3 results) No results for input(s): PROBNP in the last 8760 hours. HbA1C: No results for input(s): HGBA1C in the last 72 hours. CBG: No results for input(s): GLUCAP in the last 168 hours. Lipid Profile: No results for input(s): CHOL, HDL, LDLCALC, TRIG, CHOLHDL, LDLDIRECT in the last 72 hours. Thyroid Function Tests: No results for input(s): TSH, T4TOTAL, FREET4, T3FREE, THYROIDAB in the last 72 hours. Anemia Panel: No results for input(s): VITAMINB12, FOLATE, FERRITIN, TIBC, IRON, RETICCTPCT in the last 72 hours. Urine analysis:    Component Value Date/Time   COLORURINE YELLOW 12/23/2017 2014  APPEARANCEUR CLEAR 12/23/2017 2014   LABSPEC 1.020 11/03/2018 1454   PHURINE 8.5 (H) 11/03/2018 1454   GLUCOSEU NEGATIVE 11/03/2018 1454   HGBUR NEGATIVE 11/03/2018 1454   BILIRUBINUR NEGATIVE 11/03/2018 1454   KETONESUR NEGATIVE 11/03/2018 1454   PROTEINUR NEGATIVE 11/03/2018 1454   UROBILINOGEN 0.2 11/03/2018 1454   NITRITE NEGATIVE 11/03/2018 1454   LEUKOCYTESUR NEGATIVE 11/03/2018 1454    Radiological Exams on Admission: DG Abdomen 1 View  Result Date: 12/13/2020 CLINICAL DATA:  Bowel obstruction EXAM: ABDOMEN - 1 VIEW COMPARISON:  Contemporaneous abdominal CT FINDINGS: The enteric tube tip is at the distal stomach by CT. High-grade small bowel obstruction. Right nephrogram from prior CT. The left kidney is severely atrophic by CT. IMPRESSION: The enteric tube tip is at the distal stomach. Electronically Signed   By: Marnee Spring M.D.   On: 12/08/2020 04:48   CT ABDOMEN PELVIS W CONTRAST  Result Date: 11/20/2020 CLINICAL DATA:  Abdominal distension with bowel obstruction suspected EXAM: CT ABDOMEN AND PELVIS WITH  CONTRAST TECHNIQUE: Multidetector CT imaging of the abdomen and pelvis was performed using the standard protocol following bolus administration of intravenous contrast. CONTRAST:  57mL OMNIPAQUE IOHEXOL 300 MG/ML  SOLN COMPARISON:  12/23/2017 FINDINGS: Lower chest: Marked pectus excavatum. Generous heart size. Extensive atheromatous calcification of the aorta. Pulmonary nodule in the right lower lobe on 2:2, stable from 2016 and benign. Hepatobiliary: No focal liver abnormality.No evidence of biliary obstruction or stone. Pancreas: Unremarkable. Spleen: Unremarkable. Adrenals/Urinary Tract: Negative adrenals. Severe left renal atrophy, nonfunctional appearing. Left renal calculus measuring 13 mm. Punctate renal calculus on the right where there is also simple cystic densities. Right renal cortical lobulation from scarring. Unremarkable bladder. Stomach/Bowel: Dilated and gas/fluid-filled small bowel with transition point in the right lower abdomen where there is marked distortion and narrowing of bowel loops that are twisted upon themselves, as marked on series 2. Affected loops are thickened from submucosal edema; noted elevated lactate. No perforation or focal area of non enhancement. There is an enteric tube with tip at the distal stomach. Vascular/Lymphatic: Mechanical appearing SMA branch distortion at the level of transition point. Diffuse atheromatous calcification of the aorta and branch vessels. No mass or adenopathy. Reproductive:No pathologic findings. Other: Low-density ascites considered reactive. No noted pneumoperitoneum Musculoskeletal: Scoliosis, osteopenia, and generalized spinal degeneration. IMPRESSION: 1. High-grade small bowel obstruction with right lower quadrant transition point where there is severe bowel loop distortion and narrowing. Bowel loops are notably edematous at the level of obstruction. 2. Notable degree of ascites for a bowel obstruction, also indicative of the severity. 3.  Severe left renal atrophy with nonfunctional appearance. Electronically Signed   By: Marnee Spring M.D.   On: 11/30/2020 04:47   DG ABD ACUTE 2+V W 1V CHEST  Result Date: 12/18/2020 CLINICAL DATA:  Abdominal distension, nausea, vomiting EXAM: DG ABDOMEN ACUTE WITH 1 VIEW CHEST COMPARISON:  None. FINDINGS: Lungs appear mildly hyperinflated. The lungs are clear. No pneumothorax or pleural effusion. Mild to moderate cardiomegaly is present. Pulmonary vascularity is normal. There is marked gas disc distension of a single loop of large bowel which appears displaced into the left upper quadrant suspicious for changes of underlying cecal volvulus. There is dilated loops of small bowel noted within the mid pelvis and left lower quadrant indicative of a a possible secondary small bowel obstruction. No free intraperitoneal gas. No acute bone abnormality. IMPRESSION: Possible cecal volvulus with secondary small bowel obstruction. Dedicated contrast enhanced CT examination of the abdomen and pelvis is  recommended for further evaluation. Electronically Signed   By: Helyn Numbers MD   On: 03-Jan-2021 02:20    EKG: Independently reviewed.  Assessment/Plan Principal Problem:   SBO (small bowel obstruction) (HCC) Active Problems:   HTN (hypertension)   Protein-calorie malnutrition, severe (HCC)    SBO - NGT NPO IVF Morphine PRN pain Cont pulse ox (pt desated in ED from morphine) Bowel looks pretty edematous at transition point Getting repeat lactate Dr. Corliss Skains coming to see pt this AM HTN - Cont home amlodipine Severe malnutrition - Apparently very chronic in this patient CKD 3 - chronic and stable  DVT prophylaxis: SCDs Code Status: Full Family Communication: No family in room Disposition Plan: TBD Consults called: Dr. Corliss Skains Admission status: Admit to inpatient  Severity of Illness: The appropriate patient status for this patient is INPATIENT. Inpatient status is judged to be reasonable and  necessary in order to provide the required intensity of service to ensure the patient's safety. The patient's presenting symptoms, physical exam findings, and initial radiographic and laboratory data in the context of their chronic comorbidities is felt to place them at high risk for further clinical deterioration. Furthermore, it is not anticipated that the patient will be medically stable for discharge from the hospital within 2 midnights of admission. The following factors support the patient status of inpatient.   IP status for SBO requiring NGT.  * I certify that at the point of admission it is my clinical judgment that the patient will require inpatient hospital care spanning beyond 2 midnights from the point of admission due to high intensity of service, high risk for further deterioration and high frequency of surveillance required.*   Donne Baley M. DO [facility]  How to contact the [facility] Attending or Consulting provider 7A - 7P or covering provider during after hours 7P -7A, for this patient?  Check the care team in [facility] and look for a) attending/consulting [facility] provider listed and b) the [facility] team listed Log into [email or web address]  Amion Physician Scheduling and messaging for groups and whole hospitals  On call and physician scheduling software for group practices, residents, hospitalists and other medical providers for call, clinic, rotation and shift schedules. On[employer] is a hospital-wide system for scheduling doctors and paging doctors on call. EasyPlot is for scientific plotting and data analysis.  [email or web address]  and use [facility] universal password to access. If you do not have the password, please contact the hospital operator.  Locate the [facility] provider you are looking for under [facility] and page to a number that you can be directly reached. If you still have difficulty reaching the provider, please page the [facility] (Director on Call) for the Hospitalists listed  on amion for assistance.  01-03-21, 5:29 AM

## 2020-12-15 NOTE — Progress Notes (Signed)
Panic lab result of lactic acid 4.9 called to Dr Lucianne Muss. Orders noted.

## 2020-12-15 NOTE — ED Triage Notes (Signed)
Pt BIB EMS from home c/o abdominal pain and distention x 2 days. Hx of bowel obstruction. Last BM 2 days ago. Unable to tolerate PO intake.

## 2020-12-15 NOTE — Anesthesia Procedure Notes (Signed)
Procedure Name: Intubation Date/Time: 12/08/2020 10:44 AM Performed by: Cleda Daub, CRNA Pre-anesthesia Checklist: Patient identified, Emergency Drugs available, Suction available and Patient being monitored Patient Re-evaluated:Patient Re-evaluated prior to induction Oxygen Delivery Method: Circle system utilized Preoxygenation: Pre-oxygenation with 100% oxygen Induction Type: IV induction, Rapid sequence and Cricoid Pressure applied Ventilation: Mask ventilation without difficulty Laryngoscope Size: Mac and 3 Grade View: Grade I Tube type: Oral Tube size: 7.0 mm Number of attempts: 1 Airway Equipment and Method: Stylet and Oral airway Placement Confirmation: ETT inserted through vocal cords under direct vision, positive ETCO2 and breath sounds checked- equal and bilateral Secured at: 20 cm Tube secured with: Tape Dental Injury: Teeth and Oropharynx as per pre-operative assessment

## 2020-12-15 NOTE — Op Note (Signed)
12/06/2020  12:05 PM  PATIENT:  Amber Rowland  87 y.o. 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  This assistant was needed for exposure, dissection, suction, tissue approximation, retraction, perception, etc.  ANESTHESIA:   general  EBL:  Total I/O In: 1053.4 [I.V.:800; IV Piggyback:253.4] Out: 430 [Urine:430].  See anesthesia record  Delay start of Pharmacological VTE agent (>24hrs) due to surgical blood loss or risk of bleeding:  no  DRAINS: none   SPECIMEN:  Terminal ileum with proximal "right" colon  DISPOSITION OF SPECIMEN:  PATHOLOGY  COUNTS:  YES  PLAN OF CARE: Admit to inpatient   PATIENT DISPOSITION:  PACU - guarded condition.  INDICATION: Pleasant elderly woman with history of prior abdominal surgeries and prior bowel obstruction requiring lysis adhesions 9 years ago.  Struggles with chronic constipation.  She has had worsening distention discomfort that became much more intense the past couple of days.  Came emergency room.  CT scan concerning for closed-loop bowel obstruction.  Nasogastric tube placed with no relief and worsening pain.  I recommended emergent surgery.  The anatomy & physiology of the digestive tract was discussed.  The pathophysiology of perforation was discussed.  Differential diagnosis such as perforated ulcer or colon, etc was discussed.   Natural history risks without surgery such as death was discussed.  I recommended abdominal exploration to diagnose & treat the source of the problem.  Laparoscopic  & open techniques were discussed.   Risks such as bleeding, infection, abscess, leak, reoperation, bowel resection, possible ostomy, injury to other organs, need for repair of tissues / organs, hernia, heart attack, death, and other risks were discussed.   The risks of no intervention will lead to serious problems including death.   I expressed a good likelihood that surgery will address the problem.    Goals of post-operative recovery were discussed as well.  We will work to minimize complications although risks in an emergent setting are high.   Questions were answered.  The patient expressed understanding & wishes to proceed with surgery.      OR FINDINGS: Significant twisting volvulus of distal ileum and cecal bascule going up towards hepatic flexure.  Adhesive band at the base suspicious for combined ileocolonic volvulus and closed-loop obstruction.  No evidence of bowel ischemia anemia or major distention.  No significant bleeding or shock.  Therefore immediate anastomosis performed.  CASE DATA:  Type of patient?: LDOW CASE (Surgical Hospitalist [facility] Inpatient)  Status of Case? EMERGENT Add On  Infection Present At Time Of Surgery (PATOS)?   Necrotic cecum and colon with early gangrene  DESCRIPTION: Informed consent was confirmed.   The patient received IV antibiotics and underwent general anesthesia without any difficulty. The patient was positioned appropriately. Foley catheter had been sterilely placed. SCDs were active during the entire case.  The abdomen was prepped and draped in a sterile fashion.  Surgical timeout confirmed our plan.  Entry was gained through a midline incision through her very thin wall.  Upon entering the abdomen (organ space), I encountered a massively dilated and edematous section of colon  with necrosis ischemia in suspicious for gangrene.  No major perforation.  I had to extend the incision.  We did lyse adhesions to help mobilize the ileocolonic region and help get  the large specimen up.  It was clear that the ileocolonic region had twisted many rotations and a volvulus with a significant adhesive band at the base.  Was able to help relieve that band.  She had almost 2 feet of frankly ischemic and necrotic small bowel.  She had a very floppy cecal bascule.  However it was a segmental region only.  Remaining colon and proximal two thirds of small intestine was viable.  Therefore we proceeded with resection.  Created windows in the mesentery at the proximal ileum that was viable and transected through the mesentery using bipolar LigaSure.  I connected with a region near the hepatic flexure of the colon that was healthy and viable.  I then proceeded to do anastomosis with a 75 GIA side-to-side anastomosis.  I then transected the ileocolonic region and closed the staple defect using a TX 90 to good result.  Resulting [place] style side-to-side anastomosis.  Specimen sent off.  No bleeding intraluminally.  We closed the mesenteric defect transversely using interrupted silk suture to good result.  Ran the small bowel proximally.  We imbricated an adhesion on the distal jejunum with interrupted silk suture to protect it & prevent a new band  Irrigated with several liters of warm saline to good result.  Visceral contents viable.  We asked anesthesia to replace with a larger nasogastric tube which was able to pass down to the stomach with the tip down in the antrum.  She had redundant floppy transverse colon that could flop down towards the pelvis with a short but somewhat adequate patch of omentum.  Her left colon and sigmoid was quite decompressed, concerning for chronic SBO obstruction.  We packed the omentum down to help protect the pelvis and the right lower quadrant region where there had been some adhesions.  Approximated the fascia using 0 PDS in a running fashion since she was extremely thin-walled.  Closed the skin with 4 Monocryl and sterile dressing.  Patient blood  pressure is good without any evidence of any hemodynamic collapse.  Blood loss was minimal.  Therefore I felt was reasonable to return to the floor with medical involvement.  Low threshold to watch her in stepdown unit if she shows evidence of hemodynamic compromise in recovery room.  We will see.  I again made efforts to try and reach family with no answer.  We will work to try and be available and reach out to update them.   Ardeth Sportsman, M.D., F.A.C.S. Gastrointestinal and Minimally Invasive Surgery [facility] [address] [place], [place] [address] [phone] Main / Paging

## 2020-12-15 NOTE — Plan of Care (Signed)
Plan of care discussed. NG to LWS, O2 @ 1L N/C to maintain O2 sat > 92. Tegaderm applied to skin tear L lateral forearm.  Patient reports falling a few days ago D/T weakness.  Fall mats placed. Bed alarm set. Oriented to Call button and bed controls.

## 2020-12-15 NOTE — ED Notes (Signed)
Pt O2 dropped to 80% after morphine administration. Placed on 2L [place], back to 98%

## 2020-12-15 NOTE — Anesthesia Postprocedure Evaluation (Signed)
Anesthesia Post Note  Patient: Amber Rowland  Procedure(s) Performed: EXPLORATORY LAPAROTOMY; ILEOCECTOMY (Abdomen)     Patient location during evaluation: PACU Anesthesia Type: General Level of consciousness: awake and alert Pain management: pain level controlled Vital Signs Assessment: post-procedure vital signs reviewed and stable Respiratory status: spontaneous breathing, nonlabored ventilation, respiratory function stable and patient connected to nasal cannula oxygen Cardiovascular status: blood pressure returned to baseline and stable Postop Assessment: no apparent nausea or vomiting Anesthetic complications: no   No notable events documented.  Last Vitals:  Vitals:   11/23/2020 1315 11/28/2020 1335  BP: (!) 124/97 112/66  Pulse: 81 77  Resp: 16 16  Temp:    SpO2: 94% 95%    Last Pain:  Vitals:   12/08/2020 1335  TempSrc:   PainSc: 0-No pain                 Shelton Silvas

## 2020-12-15 NOTE — ED Notes (Signed)
Patient transported to CT 

## 2020-12-15 NOTE — Consult Note (Addendum)
Amber Rowland  12-10-1933 [id number]  CARE TEAM:  PCP: Amber Matar, MD  Outpatient Care Team: Patient Care Team: Amber Matar, MD as PCP - General (Internal Medicine)  Inpatient Treatment Team: Treatment Team: Attending Provider: Cipriano Bunker, MD; Consulting Physician: Amber Limbo, MD; Rounding Team: Amber Albert, MD; Registered Nurse: Amber Browner, RN; Utilization Review: Amber Billings, RN; Technician: Amber Rowland, NT; Social Worker: Amber Pancoast, LCSW   This patient is a 87 y.o.female who presents today for surgical evaluation at the request of Dr Amber Rowland.   Chief complaint / Reason for evaluation: SBO  Elderly woman with history of prior pelvic surgery who had bowel obstruction requiring surgery in 2013.  She lives by herself and is relatively active.  She struggles with chronic constipation.  Patient noticed worsening abdominal pain or discomfort for the past few days.  She felt rather backed up.  She tried to switch to liquids only.  She tried some milk of magnesia and felt even worse pain.  Felt rather nauseated.  Actually threw up.  Because she felt worse she came to emergency room.  Not in shock but significant abdominal pain.  CT scan showing very dilated small bowel with high-grade bowel obstruction.  Medical and surgical consultations requested.  Nasogastric tube placed.  It has been several hours since then and she still has significant abdominal pain.  Patient claims that she is relatively up and active.  Tends not to take pain medications.  She has a daughter and son that live in the region.  She is not had a bowel movement in several days.  She has some chronic kidney disease.  She takes blood pressure medicine and occasional diuretics.  She does have some glaucoma.   Assessment  Amber Rowland  87 y.o. female       Problem List:  Principal Problem:   SBO (small bowel obstruction) (HCC) Active Problems:   Chronic constipation    COPD (chronic obstructive pulmonary disease) (HCC)   HTN (hypertension)   Anxiety   Glaucoma   Protein-calorie malnutrition, severe (HCC)   Stage III chronic kidney disease (HCC)   Dysphagia   Scoliosis of cervicothoracic spine   Primary osteoarthritis involving multiple joints   Urge incontinence of urine   History of falling   History of small bowel obstruction   High-grade small bowel obstruction with no improvement after nasogastric tube decompression and rehydration.  Plan:  She remains very distended with significant abdominal pain.  I can feel the thickened small bowel loop through her very thin abdominal wall.  No improvement with nonoperative management.  I think this requires emergent operative exploration.  Normally I would try and do this with laparoscopy but she is so distended I do not think that is going to be effective.  We will try and focus on a smaller incision since she is extremely thin well-oriented, elderly, malnourished.  But I am worried that she could have an ischemic or necrotic segment of bowel that required bowel resection.  She is obviously at increased risk with her advanced age but at least have some performance status and activity.  Made attempt to call the daughter on the phone as well as her son on the phone with no answer.  I do not think this can wait.  She agrees with proceeding.  We will try to reach out to family after surgery as well.  The anatomy & physiology of the digestive tract was discussed.  The pathophysiology of perforation was discussed.  Differential diagnosis such as perforated ulcer or colon, etc was discussed.   Natural history risks without surgery such as death was discussed.  I recommended abdominal exploration to diagnose & treat the source of the problem.  Laparoscopic & open techniques were discussed.   Risks such as bleeding, infection, abscess, leak, reoperation, bowel resection, possible ostomy, injury to other organs, need for  repair of tissues / organs, hernia, heart attack, death, and other risks were discussed.   The risks of no intervention will lead to serious problems including death.   I expressed a good likelihood that surgery will address the problem.    Goals of post-operative recovery were discussed as well.  We will work to minimize complications although risks in an emergent setting are high.   Questions were answered.  The patient expressed understanding & wishes to proceed with surgery.      Monitoring hypertension try and control.  She is not in shock right now.  I would have a low threshold to monitor her in the stepdown unit or ICU should she have any hemodynamic collapse.  -VTE prophylaxis- SCDs, etc -mobilize as tolerated to help recovery  25 minutes spent in review, evaluation, examination, counseling, and coordination of care.  More than 50% of that time was spent in counseling.  Amber Sportsman, MD, FACS, MASCRS Esophageal, Gastrointestinal & Colorectal Surgery Robotic and Minimally Invasive Surgery  [facility] [address], [address] [place], [place] [address] [phone] Fax [phone] Main/Paging  CONTACT INFORMATION: Weekday (9AM-5PM) concerns: Call [facility] main office at [phone] Weeknight (5PM-9AM) or Weekend/Holiday concerns: Check [email or web address] for General Surgery [facility] coverage (Please, do not use SecureChat as it is not reliable communication to operating surgeons for immediate patient care)      2020-12-24      Past Medical History:  Diagnosis Date   Atrophy of left kidney    Chronic atrophy of the left kidney.   Benign breast cysts in female 04/12/2012   Seen on mammography 2009    Chronic constipation    COPD (chronic obstructive pulmonary disease) (HCC)    Hyperinflation and heavy smoking history strongly   Ejection fraction    Esophageal dysmotility 02/14/2015   Glaucoma (increased eye pressure)    HTN (hypertension) 04/12/2012    Ileus following gastrointestinal surgery (HCC) 04/28/2012   Malnutrition (HCC) 04/28/2012   Ovarian cyst, right 2009   s/p BSO 2009: OVARIAN FIBROTHECOMA in multiple fluid filled cysts  4.5cm region   SBO (small bowel obstruction) (HCC)    Severe malnutrition (HCC) 09/16/2013   Stage III chronic kidney disease (HCC) 02/13/2015   UTI (lower urinary tract infection)    History of chronic recurrent UTIs    Past Surgical History:  Procedure Laterality Date   BILATERAL OOPHORECTOMY  2009   PROCEDURE:  diagnostic laparoscopy, laparotomy with bilateral salpingo-   LAPAROTOMY  04/23/2012   Procedure: EXPLORATORY LAPAROTOMY;  Surgeon: Valarie Merino, MD;  Location: [facility];  Service: General;  Laterality: N/A;  enterolysis     Social History   Socioeconomic History   Marital status: Widowed    Spouse name: Not on file   Number of children: Not on file   Years of education: Not on file   Highest education level: Not on file  Occupational History   Not on file  Tobacco Use   Smoking status: Former    Packs/day: 0.80    Years: 60.00  Pack years: 48.00    Types: Cigarettes    Quit date: 03/20/2014    Years since quitting: 6.7   Smokeless tobacco: Never  Vaping Use   Vaping Use: Never used  Substance and Sexual Activity   Alcohol use: No   Drug use: No   Sexual activity: Never  Other Topics Concern   Not on file  Social History Narrative   Not on file   Social Determinants of Health   Financial Resource Strain: Not on file  Food Insecurity: Not on file  Transportation Needs: Not on file  Physical Activity: Not on file  Stress: Not on file  Social Connections: Not on file  Intimate Partner Violence: Not on file    Family History  Problem Relation Age of Onset   Diabetes Son    High blood pressure Son    Coronary artery disease Son     Current Facility-Administered Medications  Medication Dose Route Frequency Provider Last Rate Last Admin   acetaminophen (TYLENOL)  tablet 650 mg  650 mg Oral Q6H PRN Hillary BowGardner, Jared M, DO       Or   acetaminophen (TYLENOL) suppository 650 mg  650 mg Rectal Q6H PRN Hillary BowGardner, Jared M, DO       albuterol (PROVENTIL) (2.5 MG/3ML) 0.083% nebulizer solution 2.5 mg  2.5 mg Nebulization Q6H PRN Hillary BowGardner, Jared M, DO       alum & mag hydroxide-simeth (MAALOX/MYLANTA) 200-200-20 MG/5ML suspension 30 mL  30 mL Oral Q6H PRN Karie SodaGross, Conchita Truxillo, MD       amLODipine (NORVASC) tablet 7.5 mg  7.5 mg Oral Daily Amber ReilGardner, Jared M, DO       bisacodyl (DULCOLAX) suppository 10 mg  10 mg Rectal Daily Karie SodaGross, Daeshawn Redmann, MD       brimonidine (ALPHAGAN) 0.15 % ophthalmic solution 1 drop  1 drop Both Eyes BID Amber ReilGardner, Jared M, DO       dorzolamide (TRUSOPT) 2 % ophthalmic solution 1 drop  1 drop Both Eyes BID Amber ReilGardner, Jared M, DO       fentaNYL (SUBLIMAZE) injection 25-50 mcg  25-50 mcg Intravenous Q1H PRN Karie SodaGross, Orvil Faraone, MD       hydrALAZINE (APRESOLINE) injection 10 mg  10 mg Intravenous Q8H PRN Amber BunkerKumar, Pardeep, MD       lactated ringers bolus 1,000 mL  1,000 mL Intravenous Once Karie SodaGross, Grason Brailsford, MD       lactated ringers bolus 1,000 mL  1,000 mL Intravenous Q8H PRN Karie SodaGross, Vyctoria Dickman, MD       lactated ringers infusion   Intravenous Continuous Hillary BowGardner, Jared M, DO 50 mL/hr at 12/05/2020 0730 New Bag at 12/14/2020 0730   lactated ringers infusion   Intravenous Continuous Amber BunkerKumar, Pardeep, MD 100 mL/hr at 11/22/2020 0803 New Bag at 12/08/2020 0803   lip balm (CARMEX) ointment 1 application  1 application Topical BID Karie SodaGross, Sorayah Schrodt, MD       magic mouthwash  15 mL Oral QID PRN Karie SodaGross, Quade Ramirez, MD       menthol-cetylpyridinium (CEPACOL) lozenge 3 mg  1 lozenge Oral PRN Karie SodaGross, Nicholad Kautzman, MD       methocarbamol (ROBAXIN) 1,000 mg in dextrose 5 % 100 mL IVPB  1,000 mg Intravenous Q6H PRN Karie SodaGross, Kaniah Rizzolo, MD       morphine 2 MG/ML injection 1-2 mg  1-2 mg Intravenous Q2H PRN Hillary BowGardner, Jared M, DO       ondansetron [facility](ZOFRAN) injection 4 mg  4 mg Intravenous Q6H PRN Karie SodaGross, Giulietta Prokop, MD  Or    ondansetron (ZOFRAN) 8 mg in sodium chloride 0.9 % 50 mL IVPB  8 mg Intravenous Q6H PRN Karie Soda, MD       ondansetron [facility]) tablet 4 mg  4 mg Oral Q6H PRN Hillary Bow, DO       phenol (CHLORASEPTIC) mouth spray 2 spray  2 spray Mouth/Throat PRN Karie Soda, MD       prochlorperazine (COMPAZINE) injection 5-10 mg  5-10 mg Intravenous Q4H PRN Karie Soda, MD       simethicone (MYLICON) 40 MG/0.6ML suspension 80 mg  80 mg Oral QID PRN Karie Soda, MD       sodium chloride (PF) 0.9 % injection              Allergies  Allergen Reactions   Advair Hfa [Fluticasone-Salmeterol]     Did not tolerate   Symbicort [Budesonide-Formoterol Fumarate]     chet pain    ROS:   All other systems reviewed & are negative except per HPI or as noted below: Constitutional:  No fevers, chills, sweats.  Weight low but stable Eyes:  No vision changes, No discharge.  Chronic low, stable HENT:  No sore throats, nasal drainage Lymph: No neck swelling, No bruising easily Pulmonary:  No cough, productive sputum CV: No orthopnea, PND  Patient walks 20 minutes for about 1/4 miles without difficulty.  No exertional chest/neck/shoulder/arm pain. GI:  No personal nor family history of GI/colon cancer, inflammatory bowel disease, irritable bowel syndrome, allergy such as Celiac Sprue, dietary/dairy problems, colitis, ulcers nor gastritis.  No recent sick contacts/gastroenteritis.  No travel outside the country.  No changes in diet. Renal: No UTIs, No hematuria Genital:  No drainage, bleeding, masses Musculoskeletal: No severe joint pain.  Good ROM major joints Skin:  No sores or lesions.  No rashes Heme/Lymph:  No easy bleeding.  No swollen lymph nodes Neuro: No focal weakness/numbness.  No seizures Psych: No suicidal ideation.  No hallucinations  BP (!) 176/88 (BP Location: Right Arm)   Pulse 89   Temp 97.9 F (36.6 C) (Oral)   Resp 20   Ht 5' (1.524 m)   Wt 35.6 kg   SpO2 97%   BMI 15.33 kg/m    Physical Exam: Constitutional: Not cachectic.  Hygeine adequate.  Vitals signs as above.   Eyes: Pupils reactive, normal extraocular movements. Sclera nonicteric Neuro: CN II-XII intact.  No major focal sensory defects.  No major motor deficits. Lymph: No head/neck/groin lymphadenopathy Psych:  No severe agitation.  No severe anxiety.  Judgment & insight Adequate, Oriented x4, HENT: Normocephalic, Mucus membranes moist.  No thrush.   Neck: Supple, No tracheal deviation.  No obvious thyromegaly Chest: No pain to chest wall compression.  Good respiratory excursion.  No audible wheezing CV:  Pulses intact.  Regular rhythm.  No major extremity edema Abdomen:  Mostly firm.  Very distended.  Tenderness at lower abdomen especially right lower quadrant.  Evidence of peritonitis with cough and bed shake.  Some rebound tenderness and percussive tenderness.  I can feel enlarged small bowel loops through her very thin abdominal wall. . No incarcerated hernias.  No hepatomegaly.  No splenomegaly Gen:  No inguinal hernias.  No inguinal lymphadenopathy.   Ext: No obvious deformity or contracture no significant edema.  No cyanosis Skin: No major subcutaneous nodules.  Warm and dry Musculoskeletal: Severe joint rigidity not present.  No obvious clubbing.  No digital petechiae.     Results:   Labs:  Results for orders placed or performed during the hospital encounter of 11/26/2020 (from the past 48 hour(s))  CBC with Differential/Platelet     Status: Abnormal   Collection Time: 11/28/2020 12:04 AM  Result Value Ref Range   WBC 8.8 4.0 - 10.5 K/uL   RBC 5.25 (H) 3.87 - 5.11 MIL/uL   Hemoglobin 15.1 (H) 12.0 - 15.0 g/dL   HCT 16.1 (H) 09.6 - 04.5 %   MCV 93.1 80.0 - 100.0 fL   MCH 28.8 26.0 - 34.0 pg   MCHC 30.9 30.0 - 36.0 g/dL   RDW 40.9 81.1 - 91.4 %   Platelets 185 150 - 400 K/uL   nRBC 0.0 0.0 - 0.2 %   Neutrophils Relative % 90 %   Neutro Abs 7.8 (H) 1.7 - 7.7 K/uL   Lymphocytes Relative 6 %    Lymphs Abs 0.5 (L) 0.7 - 4.0 K/uL   Monocytes Relative 4 %   Monocytes Absolute 0.4 0.1 - 1.0 K/uL   Eosinophils Relative 0 %   Eosinophils Absolute 0.0 0.0 - 0.5 K/uL   Basophils Relative 0 %   Basophils Absolute 0.0 0.0 - 0.1 K/uL   Immature Granulocytes 0 %   Abs Immature Granulocytes 0.02 0.00 - 0.07 K/uL    Comment: Performed at [facility], [address] [place], [place] [address]  Comprehensive metabolic panel     Status: Abnormal   Collection Time: 11/23/2020 12:04 AM  Result Value Ref Range   Sodium 137 135 - 145 mmol/L   Potassium 4.4 3.5 - 5.1 mmol/L   Chloride 94 (L) 98 - 111 mmol/L   CO2 31 22 - 32 mmol/L   Glucose, Bld 166 (H) 70 - 99 mg/dL    Comment: Glucose reference range applies only to samples taken after fasting for at least 8 hours.   BUN 15 8 - 23 mg/dL   Creatinine, Ser 6.21 (H) 0.44 - 1.00 mg/dL   Calcium 9.9 8.9 - 30.8 mg/dL   Total Protein 8.1 6.5 - 8.1 g/dL   Albumin 4.3 3.5 - 5.0 g/dL   AST 25 15 - 41 U/L   ALT 13 0 - 44 U/L   Alkaline Phosphatase 147 (H) 38 - 126 U/L   Total Bilirubin 0.7 0.3 - 1.2 mg/dL   GFR, Estimated 48 (L) >60 mL/min    Comment: (NOTE) Calculated using the CKD-EPI Creatinine Equation (2021)    Anion gap 12 5 - 15    Comment: Performed at [facility], [address] [place], [place] [address]  Lactic acid, plasma     Status: Abnormal   Collection Time: 12/01/2020 12:04 AM  Result Value Ref Range   Lactic Acid, Venous 3.1 (HH) 0.5 - 1.9 mmol/L    Comment: CRITICAL RESULT CALLED TO, READ BACK BY AND VERIFIED WITH: JESSICA LOWDERMILK AT 0200 ON 11/21/2020 BY MAJ Performed at [facility], [address], [place], [place] [address]   Lipase, blood     Status: None   Collection Time: 11/22/2020 12:04 AM  Result Value Ref Range   Lipase 23 11 - 51 U/L    Comment: Performed at [facility], [address] [place], [place] [address]  Lactic acid, plasma      Status: Abnormal   Collection Time: 11/25/2020  5:28 AM  Result Value Ref Range   Lactic Acid, Venous 4.9 (HH) 0.5 - 1.9 mmol/L    Comment: CRITICAL RESULT CALLED TO,  READ BACK BY AND VERIFIED WITH: AMANDA LEMONS AT 0631 ON 01/12/2021 BY Katha Hamming Performed at [facility], [address], [place], [place] [address]   Resp Panel by RT-PCR (Flu A&B, Covid) Nasopharyngeal Swab     Status: None   Collection Time: 01/12/21  5:28 AM   Specimen: Nasopharyngeal Swab; Nasopharyngeal(NP) swabs in vial transport medium  Result Value Ref Range   SARS Coronavirus 2 by RT PCR NEGATIVE NEGATIVE    Comment: (NOTE) SARS-CoV-2 target nucleic acids are NOT DETECTED.  The SARS-CoV-2 RNA is generally detectable in upper respiratory specimens during the acute phase of infection. The lowest concentration of SARS-CoV-2 viral copies this assay can detect is 138 copies/mL. A negative result does not preclude SARS-Cov-2 infection and should not be used as the sole basis for treatment or other patient management decisions. A negative result may occur with  improper specimen collection/handling, submission of specimen other than nasopharyngeal swab, presence of viral mutation(s) within the areas targeted by this assay, and inadequate number of viral copies(<138 copies/mL). A negative result must be combined with clinical observations, patient history, and epidemiological information. The expected result is Negative.  Fact Sheet for Patients:  BloggerCourse.com  Fact Sheet for Healthcare Providers:  SeriousBroker.it  This test is no t yet approved or cleared by the [place] FDA and  has been authorized for detection and/or diagnosis of SARS-CoV-2 by FDA under an Emergency Use Authorization (EUA). This EUA will remain  in effect (meaning this test can be used) for the duration of the COVID-19 declaration under Section 564(b)(1) of the Act,  21 U.S.C.section 360bbb-3(b)(1), unless the authorization is terminated  or revoked sooner.       Influenza A by PCR NEGATIVE NEGATIVE   Influenza B by PCR NEGATIVE NEGATIVE    Comment: (NOTE) The Xpert Xpress SARS-CoV-2/FLU/RSV plus assay is intended as an aid in the diagnosis of influenza from Nasopharyngeal swab specimens and should not be used as a sole basis for treatment. Nasal washings and aspirates are unacceptable for Xpert Xpress SARS-CoV-2/FLU/RSV testing.  Fact Sheet for Patients: BloggerCourse.com  Fact Sheet for Healthcare Providers: SeriousBroker.it  This test is not yet approved or cleared by the [place] FDA and has been authorized for detection and/or diagnosis of SARS-CoV-2 by FDA under an Emergency Use Authorization (EUA). This EUA will remain in effect (meaning this test can be used) for the duration of the COVID-19 declaration under Section 564(b)(1) of the Act, 21 U.S.C. section 360bbb-3(b)(1), unless the authorization is terminated or revoked.  Performed at [facility], [address] [place], [place] [address]   Lactic acid, plasma     Status: Abnormal   Collection Time: 12-Jan-2021  7:43 AM  Result Value Ref Range   Lactic Acid, Venous 3.7 (HH) 0.5 - 1.9 mmol/L    Comment: CRITICAL VALUE NOTED.  VALUE IS CONSISTENT WITH PREVIOUSLY REPORTED AND CALLED VALUE. Performed at [facility], [address], [place], [place] [address]     Imaging / Studies: DG Abdomen 1 View  Result Date: January 12, 2021 CLINICAL DATA:  Bowel obstruction EXAM: ABDOMEN - 1 VIEW COMPARISON:  Contemporaneous abdominal CT FINDINGS: The enteric tube tip is at the distal stomach by CT. High-grade small bowel obstruction. Right nephrogram from prior CT. The left kidney is severely atrophic by CT. IMPRESSION: The enteric tube tip is at the distal stomach. Electronically Signed   By: Marnee Spring M.D.   On: 01-12-21 04:48  CT ABDOMEN PELVIS W CONTRAST  Result Date: 12/06/2020 CLINICAL DATA:  Abdominal distension with bowel obstruction suspected EXAM: CT ABDOMEN AND PELVIS WITH CONTRAST TECHNIQUE: Multidetector CT imaging of the abdomen and pelvis was performed using the standard protocol following bolus administration of intravenous contrast. CONTRAST:  80mL OMNIPAQUE IOHEXOL 300 MG/ML  SOLN COMPARISON:  12/23/2017 FINDINGS: Lower chest: Marked pectus excavatum. Generous heart size. Extensive atheromatous calcification of the aorta. Pulmonary nodule in the right lower lobe on 2:2, stable from 2016 and benign. Hepatobiliary: No focal liver abnormality.No evidence of biliary obstruction or stone. Pancreas: Unremarkable. Spleen: Unremarkable. Adrenals/Urinary Tract: Negative adrenals. Severe left renal atrophy, nonfunctional appearing. Left renal calculus measuring 13 mm. Punctate renal calculus on the right where there is also simple cystic densities. Right renal cortical lobulation from scarring. Unremarkable bladder. Stomach/Bowel: Dilated and gas/fluid-filled small bowel with transition point in the right lower abdomen where there is marked distortion and narrowing of bowel loops that are twisted upon themselves, as marked on series 2. Affected loops are thickened from submucosal edema; noted elevated lactate. No perforation or focal area of non enhancement. There is an enteric tube with tip at the distal stomach. Vascular/Lymphatic: Mechanical appearing SMA branch distortion at the level of transition point. Diffuse atheromatous calcification of the aorta and branch vessels. No mass or adenopathy. Reproductive:No pathologic findings. Other: Low-density ascites considered reactive. No noted pneumoperitoneum Musculoskeletal: Scoliosis, osteopenia, and generalized spinal degeneration. IMPRESSION: 1. High-grade small bowel obstruction with right lower quadrant transition point where there is  severe bowel loop distortion and narrowing. Bowel loops are notably edematous at the level of obstruction. 2. Notable degree of ascites for a bowel obstruction, also indicative of the severity. 3. Severe left renal atrophy with nonfunctional appearance. Electronically Signed   By: Marnee Spring M.D.   On: 11/27/2020 04:47   DG ABD ACUTE 2+V W 1V CHEST  Result Date: 12/14/2020 CLINICAL DATA:  Abdominal distension, nausea, vomiting EXAM: DG ABDOMEN ACUTE WITH 1 VIEW CHEST COMPARISON:  None. FINDINGS: Lungs appear mildly hyperinflated. The lungs are clear. No pneumothorax or pleural effusion. Mild to moderate cardiomegaly is present. Pulmonary vascularity is normal. There is marked gas disc distension of a single loop of large bowel which appears displaced into the left upper quadrant suspicious for changes of underlying cecal volvulus. There is dilated loops of small bowel noted within the mid pelvis and left lower quadrant indicative of a a possible secondary small bowel obstruction. No free intraperitoneal gas. No acute bone abnormality. IMPRESSION: Possible cecal volvulus with secondary small bowel obstruction. Dedicated contrast enhanced CT examination of the abdomen and pelvis is recommended for further evaluation. Electronically Signed   By: Helyn Numbers MD   On: 11/21/2020 02:20    Medications / Allergies: per chart  Antibiotics: Anti-infectives (From admission, onward)    None         Note: Portions of this report may have been transcribed using voice recognition software. Every effort was made to ensure accuracy; however, inadvertent computerized transcription errors may be present.   Any transcriptional errors that result from this process are unintentional.    Amber Sportsman, MD, FACS, MASCRS Esophageal, Gastrointestinal & Colorectal Surgery Robotic and Minimally Invasive Surgery  [facility] [address] [place], [place] [address] [phone] Fax [phone] Main/Paging  CONTACT INFORMATION: Weekday (9AM-5PM) concerns: Call [facility] main office at [phone] Weeknight (5PM-9AM) or Weekend/Holiday concerns: Check [email or web address] for General Surgery [facility] coverage (Please, do not use  SecureChat as it is not reliable communication to operating surgeons for immediate patient care)      12/06/2020  8:40 AM

## 2020-12-15 NOTE — ED Provider Notes (Signed)
[facility]-EMERGENCY DEPT Provider Note   CSN: [id number] Arrival date & time: 12/14/20  2352     History Chief Complaint  Patient presents with   Abdominal Pain    Amber Rowland is a 87 y.o. female.  Patient presents to the emergency department for evaluation of abdominal pain, abdominal distention, nausea and vomiting.  Symptoms ongoing for 2 days.  Patient reports that this feels similar to when she had a bowel obstruction in the past.  No bowel movement for several days.      Past Medical History:  Diagnosis Date   Atrophy of left kidney    Chronic atrophy of the left kidney.   Benign breast cysts in female 04/12/2012   Seen on mammography 2009    Chronic constipation    COPD (chronic obstructive pulmonary disease) (HCC)    Hyperinflation and heavy smoking history strongly   Ejection fraction    Esophageal dysmotility 02/14/2015   Glaucoma (increased eye pressure)    HTN (hypertension) 04/12/2012   Ileus following gastrointestinal surgery (HCC) 04/28/2012   Malnutrition (HCC) 04/28/2012   Ovarian cyst, right 2009   s/p BSO 2009: OVARIAN FIBROTHECOMA in multiple fluid filled cysts  4.5cm region   SBO (small bowel obstruction) (HCC)    Severe malnutrition (HCC) 09/16/2013   Stage III chronic kidney disease (HCC) 02/13/2015   UTI (lower urinary tract infection)    History of chronic recurrent UTIs    Patient Active Problem List   Diagnosis Date Noted   Pain due to onychomycosis of toenails of both feet 11/27/2020   Scoliosis of cervicothoracic spine 01/11/2020   Primary osteoarthritis involving multiple joints 01/11/2020   Urge incontinence of urine 01/11/2020   History of falling 01/11/2020   Pressure ulcer 02/14/2015   Esophageal dysmotility 02/14/2015   Atherosclerosis of aorta With small ruptured plaque 02/14/2015   Abdominal distention    Bloating    Dysphagia    Pulmonary nodule, right    Left renal artery stenosis (HCC)  02/13/2015   Dysphasia 02/13/2015   Acute kidney injury (HCC) 02/13/2015   Stage III chronic kidney disease (HCC) 02/13/2015   Pancreatic mass 02/13/2015   Protein-calorie malnutrition, severe (HCC) 09/18/2013   Murmur 09/17/2013   Chest pain 09/16/2013   Glaucoma 09/16/2013   S/P laparotomy 06/10/2012   Tobacco abuse 04/12/2012   HTN (hypertension) 04/12/2012   Benign breast cysts in female 04/12/2012   Anxiety 04/12/2012   Lung nodules, ?incidental 04/12/2012   Chronic constipation    COPD (chronic obstructive pulmonary disease) (HCC)     Past Surgical History:  Procedure Laterality Date   BILATERAL OOPHORECTOMY  2009   PROCEDURE:  diagnostic laparoscopy, laparotomy with bilateral salpingo-   LAPAROTOMY  04/23/2012   Procedure: EXPLORATORY LAPAROTOMY;  Surgeon: Valarie Merino, MD;  Location: [facility];  Service: General;  Laterality: N/A;  enterolysis      OB History   No obstetric history on file.     Family History  Problem Relation Age of Onset   Diabetes Son    High blood pressure Son    Coronary artery disease Son     Social History   Tobacco Use   Smoking status: Former    Packs/day: 0.80    Years: 60.00    Pack years: 48.00    Types: Cigarettes    Quit date: 03/20/2014    Years since quitting: 6.7   Smokeless tobacco: Never  Vaping Use   Vaping Use: Never  used  Substance Use Topics   Alcohol use: No   Drug use: No    Home Medications Prior to Admission medications   Medication Sig Start Date End Date Taking? Authorizing Provider  albuterol (VENTOLIN HFA) 108 (90 Base) MCG/ACT inhaler Inhale 2 puffs into the lungs every 6 (six) hours as needed for wheezing or shortness of breath. 02/27/20  Yes Marcine Matar, MD  amLODipine (NORVASC) 5 MG tablet TAKE 1.5 TABLETS BY MOUTH DAILY. 08/11/20  Yes Marcine Matar, MD  aspirin EC 81 MG tablet Take 81-162 mg by mouth daily as needed. Swallow whole.   Yes [provider]  brimonidine (ALPHAGAN)  0.15 % ophthalmic solution Place 1 drop into both eyes 2 (two) times daily.  12/11/19  Yes [provider]  dorzolamide (TRUSOPT) 2 % ophthalmic solution Place 1 drop into both eyes 2 (two) times daily. 12/11/20  Yes [provider]  Elastic Bandages & Supports (MEDICAL COMPRESSION STOCKINGS) MISC Use as directed. Diagnosis: bilateral lower extremity edema R22.43. Please fit as appropriate. 08/07/20  Yes Hagler, Arlys John, MD  furosemide (LASIX) 20 MG tablet Take 1-2 tablets every morning to help with the swelling in your legs. 08/07/20  Yes Hagler, Arlys John, MD  tolnaftate (TINACTIN) 1 % cream Apply 1 application topically 2 (two) times daily. Apply to feet twice daily 12/16/19  Yes Pfeiffer, Lebron Conners, MD  sertraline (ZOLOFT) 50 MG tablet TAKE 1/2 TABLET BY MOUTH DAILY Patient not taking: Reported on 12/27/2020 07/11/20   Marcine Matar, MD    Allergies    Advair hfa [fluticasone-salmeterol] and Symbicort [budesonide-formoterol fumarate]  Review of Systems   Review of Systems  Gastrointestinal:  Positive for abdominal distention, abdominal pain, nausea and vomiting.  All other systems reviewed and are negative.  Physical Exam Updated Vital Signs BP (!) 154/85   Pulse 93   Temp 97.8 F (36.6 C) (Oral)   Resp 16   Ht 5' (1.524 m)   Wt 37 kg   SpO2 95%   BMI 15.93 kg/m   Physical Exam Vitals and nursing note reviewed.  Constitutional:      General: She is not in acute distress.    Appearance: Normal appearance. She is well-developed.  HENT:     Head: Normocephalic and atraumatic.     Right Ear: Hearing normal.     Left Ear: Hearing normal.     Nose: Nose normal.  Eyes:     Conjunctiva/sclera: Conjunctivae normal.     Pupils: Pupils are equal, round, and reactive to light.  Cardiovascular:     Rate and Rhythm: Regular rhythm.     Heart sounds: S1 normal and S2 normal. No murmur heard.   No friction rub. No gallop.  Pulmonary:     Effort: Pulmonary effort is normal.  No respiratory distress.     Breath sounds: Normal breath sounds.  Chest:     Chest wall: No tenderness.  Abdominal:     General: Bowel sounds are absent. There is distension (tympanetic).     Palpations: Abdomen is soft.     Tenderness: There is generalized abdominal tenderness. There is no guarding or rebound. Negative signs include Murphy's sign and McBurney's sign.     Hernia: No hernia is present.  Musculoskeletal:        General: Normal range of motion.     Cervical back: Normal range of motion and neck supple.  Skin:    General: Skin is warm and dry.  Findings: No rash.  Neurological:     Mental Status: She is alert and oriented to person, place, and time.     GCS: GCS eye subscore is 4. GCS verbal subscore is 5. GCS motor subscore is 6.     Cranial Nerves: No cranial nerve deficit.     Sensory: No sensory deficit.     Coordination: Coordination normal.  Psychiatric:        Speech: Speech normal.        Behavior: Behavior normal.        Thought Content: Thought content normal.    ED Results / Procedures / Treatments   Labs (all labs ordered are listed, but only abnormal results are displayed) Labs Reviewed  CBC WITH DIFFERENTIAL/PLATELET - Abnormal; Notable for the following components:      Result Value   RBC 5.25 (*)    Hemoglobin 15.1 (*)    HCT 48.9 (*)    Neutro Abs 7.8 (*)    Lymphs Abs 0.5 (*)    All other components within normal limits  COMPREHENSIVE METABOLIC PANEL - Abnormal; Notable for the following components:   Chloride 94 (*)    Glucose, Bld 166 (*)    Creatinine, Ser 1.12 (*)    Alkaline Phosphatase 147 (*)    GFR, Estimated 48 (*)    All other components within normal limits  LACTIC ACID, PLASMA - Abnormal; Notable for the following components:   Lactic Acid, Venous 3.1 (*)    All other components within normal limits  RESP PANEL BY RT-PCR (FLU A&B, COVID) ARPGX2  LIPASE, BLOOD  LACTIC ACID, PLASMA    EKG None  Radiology DG Abdomen  1 View  Result Date: 03-13-2021 CLINICAL DATA:  Bowel obstruction EXAM: ABDOMEN - 1 VIEW COMPARISON:  Contemporaneous abdominal CT FINDINGS: The enteric tube tip is at the distal stomach by CT. High-grade small bowel obstruction. Right nephrogram from prior CT. The left kidney is severely atrophic by CT. IMPRESSION: The enteric tube tip is at the distal stomach. Electronically Signed   By: Marnee SpringJonathon  Watts M.D.   On: 009-22-2022 04:48   CT ABDOMEN PELVIS W CONTRAST  Result Date: 03-13-2021 CLINICAL DATA:  Abdominal distension with bowel obstruction suspected EXAM: CT ABDOMEN AND PELVIS WITH CONTRAST TECHNIQUE: Multidetector CT imaging of the abdomen and pelvis was performed using the standard protocol following bolus administration of intravenous contrast. CONTRAST:  80mL OMNIPAQUE IOHEXOL 300 MG/ML  SOLN COMPARISON:  12/23/2017 FINDINGS: Lower chest: Marked pectus excavatum. Generous heart size. Extensive atheromatous calcification of the aorta. Pulmonary nodule in the right lower lobe on 2:2, stable from 2016 and benign. Hepatobiliary: No focal liver abnormality.No evidence of biliary obstruction or stone. Pancreas: Unremarkable. Spleen: Unremarkable. Adrenals/Urinary Tract: Negative adrenals. Severe left renal atrophy, nonfunctional appearing. Left renal calculus measuring 13 mm. Punctate renal calculus on the right where there is also simple cystic densities. Right renal cortical lobulation from scarring. Unremarkable bladder. Stomach/Bowel: Dilated and gas/fluid-filled small bowel with transition point in the right lower abdomen where there is marked distortion and narrowing of bowel loops that are twisted upon themselves, as marked on series 2. Affected loops are thickened from submucosal edema; noted elevated lactate. No perforation or focal area of non enhancement. There is an enteric tube with tip at the distal stomach. Vascular/Lymphatic: Mechanical appearing SMA branch distortion at the level of  transition point. Diffuse atheromatous calcification of the aorta and branch vessels. No mass or adenopathy. Reproductive:No pathologic findings. Other: Low-density ascites considered  reactive. No noted pneumoperitoneum Musculoskeletal: Scoliosis, osteopenia, and generalized spinal degeneration. IMPRESSION: 1. High-grade small bowel obstruction with right lower quadrant transition point where there is severe bowel loop distortion and narrowing. Bowel loops are notably edematous at the level of obstruction. 2. Notable degree of ascites for a bowel obstruction, also indicative of the severity. 3. Severe left renal atrophy with nonfunctional appearance. Electronically Signed   By: Marnee Spring M.D.   On: 12/21/20 04:47   DG ABD ACUTE 2+V W 1V CHEST  Result Date: 12/21/2020 CLINICAL DATA:  Abdominal distension, nausea, vomiting EXAM: DG ABDOMEN ACUTE WITH 1 VIEW CHEST COMPARISON:  None. FINDINGS: Lungs appear mildly hyperinflated. The lungs are clear. No pneumothorax or pleural effusion. Mild to moderate cardiomegaly is present. Pulmonary vascularity is normal. There is marked gas disc distension of a single loop of large bowel which appears displaced into the left upper quadrant suspicious for changes of underlying cecal volvulus. There is dilated loops of small bowel noted within the mid pelvis and left lower quadrant indicative of a a possible secondary small bowel obstruction. No free intraperitoneal gas. No acute bone abnormality. IMPRESSION: Possible cecal volvulus with secondary small bowel obstruction. Dedicated contrast enhanced CT examination of the abdomen and pelvis is recommended for further evaluation. Electronically Signed   By: Helyn Numbers MD   On: 12-21-20 02:20    Procedures Procedures   Medications Ordered in ED Medications  sodium chloride (PF) 0.9 % injection (has no administration in time range)  ondansetron (ZOFRAN) injection 4 mg (4 mg Intravenous Given 2020/12/21 0039)   morphine 2 MG/ML injection 2 mg (2 mg Intravenous Given December 21, 2020 0040)  lactated ringers bolus 1,000 mL (1,000 mLs Intravenous New Bag/Given (Non-Interop) 2020-12-21 0215)  iohexol (OMNIPAQUE) 300 MG/ML solution 80 mL (80 mLs Intravenous Contrast Given 21-Dec-2020 0237)  morphine 2 MG/ML injection 2 mg (2 mg Intravenous Given December 21, 2020 0345)    ED Course  I have reviewed the triage vital signs and the nursing notes.  Pertinent labs & imaging results that were available during my care of the patient were reviewed by me and considered in my medical decision making (see chart for details).    MDM Rules/Calculators/A&P                          Patient presents to the emergency department for evaluation of abdominal pain with nausea and vomiting.  Patient reports 2 days of progressively worsening pain.  Examination revealed significant distention, tympany with absent bowel sounds.  X-ray consistent with obstruction.  Patient had NG tube placed.  Further work-up included CT scan that confirms high-grade small bowel obstruction with transition in the right lower quadrant.  Patient with elevated lactate at 3.1.  Discussed with Dr. Corliss Skains, will consult on patient.  Patient to be admitted by hospitalist service.  CRITICAL CARE Performed by: Gilda Crease   Total critical care time: 30 minutes  Critical care time was exclusive of separately billable procedures and treating other patients.  Critical care was necessary to treat or prevent imminent or life-threatening deterioration.  Critical care was time spent personally by me on the following activities: development of treatment plan with patient and/or surrogate as well as nursing, discussions with consultants, evaluation of patient's response to treatment, examination of patient, obtaining history from patient or surrogate, ordering and performing treatments and interventions, ordering and review of laboratory studies, ordering and review of  radiographic studies, pulse oximetry and re-evaluation of  patient's condition.   Final Clinical Impression(s) / ED Diagnoses Final diagnoses:  SBO (small bowel obstruction) (HCC)    Rx / DC Orders ED Discharge Orders     None        Wataru Mccowen, Canary Brim, MD January 01, 2021 [phone]

## 2020-12-15 NOTE — Transfer of Care (Signed)
Immediate Anesthesia Transfer of Care Note  Patient: Amber Rowland  Procedure(s) Performed: EXPLORATORY LAPAROTOMY; ILEOCECTOMY (Abdomen)  Patient Location: PACU  Anesthesia Type:General  Level of Consciousness: awake, alert , oriented and patient cooperative  Airway & Oxygen Therapy: Patient Spontanous Breathing and Patient connected to nasal cannula oxygen  Post-op Assessment: Report given to RN and Post -op Vital signs reviewed and stable  Post vital signs: Reviewed and stable  Last Vitals:  Vitals Value Taken Time  BP 113/51 11/26/2020 1219  Temp    Pulse 87 11/27/2020 1225  Resp 22 11/30/2020 1227  SpO2 100 % 11/21/2020 1225  Vitals shown include unvalidated device data.  Last Pain:  Vitals:   12/08/2020 0746  TempSrc: Oral  PainSc:       Patients Stated Pain Goal: 2 (11/20/2020 0800)  Complications: No notable events documented.

## 2020-12-15 NOTE — Progress Notes (Signed)
I called patient's daughter but again no answer.  I was able to reach the patient's son on calling another time.  I discussed operative findings, updated the patient's status, discussed probable steps to recovery, and gave postoperative recommendations to the patient's son, Jlee Harkless [phone]  .  Recommendations were made.  Questions were answered.  He expressed understanding & appreciation.

## 2020-12-16 ENCOUNTER — Encounter (HOSPITAL_COMMUNITY): Payer: Self-pay | Admitting: Surgery

## 2020-12-16 LAB — MAGNESIUM: Magnesium: 2 mg/dL (ref 1.7–2.4)

## 2020-12-16 LAB — CBC
HCT: 36.3 % (ref 36.0–46.0)
Hemoglobin: 10.9 g/dL — ABNORMAL LOW (ref 12.0–15.0)
MCH: 29 pg (ref 26.0–34.0)
MCHC: 30 g/dL (ref 30.0–36.0)
MCV: 96.5 fL (ref 80.0–100.0)
Platelets: 139 10*3/uL — ABNORMAL LOW (ref 150–400)
RBC: 3.76 MIL/uL — ABNORMAL LOW (ref 3.87–5.11)
RDW: 13.5 % (ref 11.5–15.5)
WBC: 10.2 10*3/uL (ref 4.0–10.5)
nRBC: 0 % (ref 0.0–0.2)

## 2020-12-16 LAB — BASIC METABOLIC PANEL
Anion gap: 5 (ref 5–15)
BUN: 22 mg/dL (ref 8–23)
CO2: 33 mmol/L — ABNORMAL HIGH (ref 22–32)
Calcium: 8.9 mg/dL (ref 8.9–10.3)
Chloride: 99 mmol/L (ref 98–111)
Creatinine, Ser: 1.13 mg/dL — ABNORMAL HIGH (ref 0.44–1.00)
GFR, Estimated: 48 mL/min — ABNORMAL LOW (ref >60)
Glucose, Bld: 98 mg/dL (ref 70–99)
Potassium: 4.4 mmol/L (ref 3.5–5.1)
Sodium: 137 mmol/L (ref 135–145)

## 2020-12-16 LAB — PHOSPHORUS: Phosphorus: 4.5 mg/dL (ref 2.5–4.6)

## 2020-12-16 MED ORDER — ALVIMOPAN 12 MG PO CAPS
12.0000 mg | ORAL_CAPSULE | Freq: Two times a day (BID) | ORAL | Status: DC
  Administered 2020-12-18: 12 mg via ORAL
  Filled 2020-12-16: qty 1

## 2020-12-16 MED ORDER — LACTATED RINGERS IV SOLN: INTRAVENOUS | Status: AC

## 2020-12-16 MED ORDER — DIPHENHYDRAMINE HCL 50 MG/ML IJ SOLN
12.5000 mg | Freq: Four times a day (QID) | INTRAMUSCULAR | Status: DC | PRN
  Administered 2020-12-23 (×2): 12.5 mg via INTRAVENOUS
  Filled 2020-12-16 (×2): qty 1

## 2020-12-16 MED ORDER — SODIUM CHLORIDE 0.9 % IV SOLN
2.0000 g | Freq: Two times a day (BID) | INTRAVENOUS | Status: AC
  Administered 2020-12-17: 2 g via INTRAVENOUS
  Filled 2020-12-16 (×2): qty 2

## 2020-12-16 MED ORDER — SODIUM CHLORIDE 0.9 % IV SOLN: Freq: Once | INTRAVENOUS | Status: AC

## 2020-12-16 MED ORDER — ENOXAPARIN SODIUM 30 MG/0.3ML IJ SOSY
30.0000 mg | PREFILLED_SYRINGE | INTRAMUSCULAR | Status: DC
  Administered 2020-12-17 – 2020-12-22 (×6): 30 mg via SUBCUTANEOUS
  Filled 2020-12-16 (×6): qty 0.3

## 2020-12-16 MED ORDER — ENALAPRILAT 1.25 MG/ML IV SOLN
0.6250 mg | Freq: Four times a day (QID) | INTRAVENOUS | Status: DC | PRN
  Filled 2020-12-16: qty 1

## 2020-12-16 MED ORDER — LACTATED RINGERS IV SOLN: INTRAVENOUS | Status: DC

## 2020-12-16 MED ORDER — LACTATED RINGERS IV BOLUS: 1000.0000 mL | Freq: Three times a day (TID) | INTRAVENOUS | Status: AC | PRN

## 2020-12-16 MED ORDER — HYDROMORPHONE HCL 1 MG/ML IJ SOLN
0.5000 mg | INTRAMUSCULAR | Status: DC | PRN
  Administered 2020-12-17: 1 mg via INTRAVENOUS
  Administered 2020-12-17: 0.5 mg via INTRAVENOUS
  Filled 2020-12-16 (×2): qty 1

## 2020-12-16 MED ORDER — SODIUM CHLORIDE 0.9 % IV SOLN
2.0000 g | Freq: Every day | INTRAVENOUS | Status: AC
  Administered 2020-12-16 – 2020-12-20 (×5): 2 g via INTRAVENOUS
  Filled 2020-12-16 (×3): qty 2
  Filled 2020-12-16: qty 20
  Filled 2020-12-16: qty 2

## 2020-12-16 MED ORDER — ENSURE SURGERY PO LIQD
237.0000 mL | Freq: Two times a day (BID) | ORAL | Status: DC
  Filled 2020-12-16 (×4): qty 237

## 2020-12-16 MED ORDER — METOPROLOL TARTRATE 5 MG/5ML IV SOLN: 5.0000 mg | Freq: Four times a day (QID) | INTRAVENOUS | Status: DC | PRN

## 2020-12-16 NOTE — Progress Notes (Signed)
PROGRESS NOTE    Amber Rowland  ZOX:[id number] DOB: 09/30/1933 DOA: 2021-04-01 PCP: Marcine MatarJohnson, Deborah B, MD   Brief Narrative:  This 87 years old female with PMH significant for prior small bowel obstruction requiring lysis of adhesions in 2013, severe malnutrition, hypertension, CKD stage III presented in the ED with complaints of severe abdominal pain, distention associated with nausea and vomiting and symptoms has been ongoing for last 2 days.  She has no bowel movement in several days.  CT abdomen consistent with high-grade small bowel obstruction with transition point in the right lower quadrant. Patient is admitted for small bowel obstruction, NG tube was inserted,  shows coffee-ground emesis.  General surgery was consulted, Patient underwent exploratory laparotomy on 6/26, tolerated well. .  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 consistent with high-grade small bowel obstruction with transition point in right lower quadrant. Continue NG tube, NPO, IV hydration. Adequate pain control pain medications General surgery consulted,  She underwent exploratory laparotomy with lysis of adhesions, s/p proximal colectomy with ileal resection,  Tolerated well, Post Op day 1. Continue Zofran for nausea and vomiting. Continue antibiotics  ceftriaxone and Flagyl. Continue Foley catheter until mobilizing. PT and OT evaluation  Hypertension Continue amlodipine.  Severe malnutrition Apparently very chronic.  CKD stage IIIb:  Serum creatinine at baseline.  Avoid nephrotoxic medications  DVT prophylaxis:  SCDs Code Status: Full code. Family Communication: No family at bed side. Disposition Plan:   Status is: Inpatient  Remains inpatient appropriate because:Inpatient level of care appropriate due to severity of illness  Dispo: The patient is from: Home              Anticipated d/c is to: SNF              Patient currently is not medically stable to d/c.   Difficult to place patient No  Consultants:  General surgery  Procedures:  Exploratory Laparotomy Antimicrobials:  Anti-infectives (From admission, onward)    Start     Dose/Rate Route Frequency Ordered Stop   12/16/20 1100  cefTRIAXone (ROCEPHIN) 2 g in sodium chloride 0.9 % 100 mL IVPB        2 g 200 mL/hr over 30 Minutes Intravenous Daily 12/16/20 1010     08-20-20 1000  cefTRIAXone (ROCEPHIN) 2 g in sodium chloride 0.9 % 100 mL IVPB       See Hyperspace for full Linked Orders Report.   2 g 200 mL/hr over 30 Minutes Intravenous On call to O.R. 08-20-20 0905 08-20-20 1045   08-20-20 1000  metroNIDAZOLE (FLAGYL) IVPB 500 mg       See Hyperspace for full Linked Orders Report.   500 mg 100 mL/hr over 60 Minutes Intravenous Every 8 hours 08-20-20 0905          Subjective: Patient was seen and examined at bedside overnight events noted.  Patient reports feeling improved.   She has NG tube with brownish drainage.  She reports abdominal soreness.  Sitting in the chair.  Objective: Vitals:   12/16/20 0117 12/16/20 0454 12/16/20 0940 12/16/20 1418  BP: (!) 104/55 (!) 102/53 (!) 90/55 (!) 97/56  Pulse: 86 93 97 85  Resp: 17 17 18    Temp: 98.6 F (37 C) 98.3 F (36.8 C) 97.7 F (36.5 C)   TempSrc: Oral Oral Oral   SpO2: 95% 94% 98% 99%  Weight:       Height:        Intake/Output Summary (Last 24 hours) at 12/16/2020 1442 Last data filed at 12/16/2020 1000 Gross per 24 hour  Intake 680 ml  Output 1275 ml  Net -595 ml   Filed Weights   01-01-21 0007 2021/01/01 0620  Weight: 37 kg 35.6 kg    Examination:  General exam: Appears calm and comfortable , sitting in the chair, reports soreness. Respiratory system: Clear to auscultation. Respiratory effort normal. Cardiovascular system: S1 & S2 heard, RRR. No JVD, murmurs, rubs, gallops or clicks. No pedal edema. Gastrointestinal system: Abdomen is distended, soft and tender.  Dressing noted, No organomegaly or masses felt.  NO bowel sounds heard. Central nervous system: Alert and oriented. No focal neurological deficits. Extremities: Symmetric 5 x 5 power. No edema, no cyanosis, no clubbing.  Skin: No rashes, lesions or ulcers Psychiatry: Judgement and insight appear normal. Mood & affect appropriate.     Data Reviewed: I have personally reviewed following labs and imaging studies  CBC: Recent Labs  Lab Jan 01, 2021 0004 12/16/20 0547  WBC 8.8 10.2  NEUTROABS 7.8*  --   HGB 15.1* 10.9*  HCT 48.9* 36.3  MCV 93.1 96.5  PLT 185 139*   Basic Metabolic Panel: Recent Labs  Lab 01/01/2021 0004 12/16/20 0422  NA 137 137  K 4.4 4.4  CL 94* 99  CO2 31 33*  GLUCOSE 166* 98  BUN 15 22  CREATININE 1.12* 1.13*  CALCIUM 9.9 8.9  MG  --  2.0  PHOS  --  4.5   GFR: Estimated Creatinine Clearance: 20.5 mL/min (A) (by C-G formula based on SCr of 1.13 mg/dL (H)). Liver Function Tests: Recent Labs  Lab 01-Jan-2021 0004  AST 25  ALT 13  ALKPHOS 147*  BILITOT 0.7  PROT 8.1  ALBUMIN 4.3   Recent Labs  Lab 2021/01/01 0004  LIPASE 23   No results for input(s): AMMONIA in the last 168 hours. Coagulation Profile: No results for input(s): INR, PROTIME in the last 168 hours. Cardiac Enzymes: No results for input(s): CKTOTAL, CKMB, CKMBINDEX, TROPONINI in the last 168 hours. BNP (last  3 results) No results for input(s): PROBNP in the last 8760 hours. HbA1C: No results for input(s): HGBA1C in the last 72 hours. CBG: No results for input(s): GLUCAP in the last 168 hours. Lipid Profile: No results for input(s): CHOL, HDL, LDLCALC, TRIG, CHOLHDL, LDLDIRECT in the last 72 hours. Thyroid Function Tests: No results for input(s): TSH, T4TOTAL, FREET4, T3FREE, THYROIDAB in the last 72 hours. Anemia Panel: No results for input(s): VITAMINB12, FOLATE, FERRITIN, TIBC, IRON, RETICCTPCT in the last 72 hours. Sepsis Labs: Recent Labs  Lab 01-Jan-2021 0004 2021/01/01 0528 01-01-21 0743 01/01/2021 1246  LATICACIDVEN 3.1* 4.9* 3.7* 5.3*    Recent Results (from the past 240 hour(s))  Resp Panel by RT-PCR (Flu A&B, Covid) Nasopharyngeal Swab     Status: None   Collection Time: Jan 01, 2021  5:28 AM   Specimen:  Nasopharyngeal Swab; Nasopharyngeal(NP) swabs in vial transport medium  Result Value Ref Range Status   SARS Coronavirus 2 by RT PCR NEGATIVE NEGATIVE Final    Comment: (NOTE) SARS-CoV-2 target nucleic acids are NOT DETECTED.  The SARS-CoV-2 RNA is generally detectable in upper respiratory specimens during the acute phase of infection. The lowest concentration of SARS-CoV-2 viral copies this assay can detect is 138 copies/mL. A negative result does not preclude SARS-Cov-2 infection and should not be used as the sole basis for treatment or other patient management decisions. A negative result may occur with  improper specimen collection/handling, submission of specimen other than nasopharyngeal swab, presence of viral mutation(s) within the areas targeted by this assay, and inadequate number of viral copies(<138 copies/mL). A negative result must be combined with clinical observations, patient history, and epidemiological information. The expected result is Negative.  Fact Sheet for Patients:  BloggerCourse.com  Fact Sheet for Healthcare Providers:   SeriousBroker.it  This test is no t yet approved or cleared by the [place] FDA and  has been authorized for detection and/or diagnosis of SARS-CoV-2 by FDA under an Emergency Use Authorization (EUA). This EUA will remain  in effect (meaning this test can be used) for the duration of the COVID-19 declaration under Section 564(b)(1) of the Act, 21 U.S.C.section 360bbb-3(b)(1), unless the authorization is terminated  or revoked sooner.       Influenza A by PCR NEGATIVE NEGATIVE Final   Influenza B by PCR NEGATIVE NEGATIVE Final    Comment: (NOTE) The Xpert Xpress SARS-CoV-2/FLU/RSV plus assay is intended as an aid in the diagnosis of influenza from Nasopharyngeal swab specimens and should not be used as a sole basis for treatment. Nasal washings and aspirates are unacceptable for Xpert Xpress SARS-CoV-2/FLU/RSV testing.  Fact Sheet for Patients: BloggerCourse.com  Fact Sheet for Healthcare Providers: SeriousBroker.it  This test is not yet approved or cleared by the [place] FDA and has been authorized for detection and/or diagnosis of SARS-CoV-2 by FDA under an Emergency Use Authorization (EUA). This EUA will remain in effect (meaning this test can be used) for the duration of the COVID-19 declaration under Section 564(b)(1) of the Act, 21 U.S.C. section 360bbb-3(b)(1), unless the authorization is terminated or revoked.  Performed at [facility], [address] [place], [place] [address]   Surgical pcr screen     Status: None   Collection Time: 12/14/2020  8:54 AM   Specimen: Nasal Mucosa; Nasal Swab  Result Value Ref Range Status   MRSA, PCR NEGATIVE NEGATIVE Final   Staphylococcus aureus NEGATIVE NEGATIVE Final    Comment: (NOTE) The Xpert SA Assay (FDA approved for NASAL specimens in patients 34 years of age and older), is one component of a  comprehensive surveillance program. It is not intended to diagnose infection nor to guide or monitor treatment. Performed at [facility], [address] [place], [place] [address]          Radiology Studies: DG Abdomen 1 View  Result Date: 12/02/2020 CLINICAL DATA:  Bowel obstruction EXAM: ABDOMEN - 1 VIEW COMPARISON:  Contemporaneous abdominal CT FINDINGS: The enteric tube tip is at the distal stomach by CT. High-grade small bowel obstruction. Right nephrogram from prior CT. The left kidney is severely atrophic by CT. IMPRESSION: The enteric tube tip is at the distal stomach. Electronically Signed   By: Marnee Spring M.D.   On: 11/24/2020 04:48   CT ABDOMEN PELVIS W CONTRAST  Result Date: 12/14/2020 CLINICAL  DATA:  Abdominal distension with bowel obstruction suspected EXAM: CT ABDOMEN AND PELVIS WITH CONTRAST TECHNIQUE: Multidetector CT imaging of the abdomen and pelvis was performed using the standard protocol following bolus administration of intravenous contrast. CONTRAST:  27mL OMNIPAQUE IOHEXOL 300 MG/ML  SOLN COMPARISON:  12/23/2017 FINDINGS: Lower chest: Marked pectus excavatum. Generous heart size. Extensive atheromatous calcification of the aorta. Pulmonary nodule in the right lower lobe on 2:2, stable from 2016 and benign. Hepatobiliary: No focal liver abnormality.No evidence of biliary obstruction or stone. Pancreas: Unremarkable. Spleen: Unremarkable. Adrenals/Urinary Tract: Negative adrenals. Severe left renal atrophy, nonfunctional appearing. Left renal calculus measuring 13 mm. Punctate renal calculus on the right where there is also simple cystic densities. Right renal cortical lobulation from scarring. Unremarkable bladder. Stomach/Bowel: Dilated and gas/fluid-filled small bowel with transition point in the right lower abdomen where there is marked distortion and narrowing of bowel loops that are twisted upon themselves, as marked on series 2. Affected  loops are thickened from submucosal edema; noted elevated lactate. No perforation or focal area of non enhancement. There is an enteric tube with tip at the distal stomach. Vascular/Lymphatic: Mechanical appearing SMA branch distortion at the level of transition point. Diffuse atheromatous calcification of the aorta and branch vessels. No mass or adenopathy. Reproductive:No pathologic findings. Other: Low-density ascites considered reactive. No noted pneumoperitoneum Musculoskeletal: Scoliosis, osteopenia, and generalized spinal degeneration. IMPRESSION: 1. High-grade small bowel obstruction with right lower quadrant transition point where there is severe bowel loop distortion and narrowing. Bowel loops are notably edematous at the level of obstruction. 2. Notable degree of ascites for a bowel obstruction, also indicative of the severity. 3. Severe left renal atrophy with nonfunctional appearance. Electronically Signed   By: Marnee Spring M.D.   On: 12/07/2020 04:47   DG ABD ACUTE 2+V W 1V CHEST  Result Date: 12/07/2020 CLINICAL DATA:  Abdominal distension, nausea, vomiting EXAM: DG ABDOMEN ACUTE WITH 1 VIEW CHEST COMPARISON:  None. FINDINGS: Lungs appear mildly hyperinflated. The lungs are clear. No pneumothorax or pleural effusion. Mild to moderate cardiomegaly is present. Pulmonary vascularity is normal. There is marked gas disc distension of a single loop of large bowel which appears displaced into the left upper quadrant suspicious for changes of underlying cecal volvulus. There is dilated loops of small bowel noted within the mid pelvis and left lower quadrant indicative of a a possible secondary small bowel obstruction. No free intraperitoneal gas. No acute bone abnormality. IMPRESSION: Possible cecal volvulus with secondary small bowel obstruction. Dedicated contrast enhanced CT examination of the abdomen and pelvis is recommended for further evaluation. Electronically Signed   By: Helyn Numbers MD    On: 12/18/2020 02:20    Scheduled Meds:  amLODipine  7.5 mg Oral Daily   bisacodyl  10 mg Rectal Daily   brimonidine  1 drop Both Eyes BID   Chlorhexidine Gluconate Cloth  6 each Topical Daily   dorzolamide  1 drop Both Eyes BID   lip balm  1 application Topical BID   Continuous Infusions:  cefTRIAXone (ROCEPHIN)  IV 2 g (12/16/20 1133)   lactated ringers     lactated ringers Stopped (12/09/2020 1152)   lactated ringers 100 mL/hr at 12/10/2020 0803   methocarbamol (ROBAXIN) IV     metronidazole 500 mg (12/16/20 0916)   ondansetron (ZOFRAN) IV       LOS: 1 day    Time spent: 35 mins    Takeila Thayne, MD [facility]   If 7PM-7AM, please contact night-coverage

## 2020-12-16 NOTE — Progress Notes (Signed)
Dr Lucianne Muss notified earlier this afternoon of soft BP's and decreased urinary output. Pt remains confused, numerous attempts to reorient unsuccessful. Orders noted

## 2020-12-16 NOTE — Progress Notes (Signed)
PT Cancellation Note  Patient Details Name: Amber Rowland MRN: [id number] DOB: 08-Apr-1934   Cancelled Treatment:    Reason Eval/Treat Not Completed: Fatigue/lethargy limiting ability to participate Pt sleeping with eyes open in bed, briefly awakened to verbal and tactile cue however fell back asleep.  PT to check back as schedule permits.   Karisma Meiser,KATHrine E 12/16/2020, 1:37 PM Thomasene Mohair PT, DPT Acute Rehabilitation Services Pager: [phone] Office: [phone]

## 2020-12-16 NOTE — Progress Notes (Signed)
1 Day Post-Op  Subjective: CC: Patient planing of pain around her midline wound.  No nausea.  NG tube bilious.  Denies flatus or BM.  Has not gotten out of bed.  Foley in place.  Objective: Vital signs in last 24 hours: Temp:  [97.6 F (36.4 C)-99.8 F (37.7 C)] 97.7 F (36.5 C) (06/27 0940) Pulse Rate:  [77-97] 97 (06/27 0940) Resp:  [16-25] 18 (06/27 0940) BP: (90-126)/(51-103) 90/55 (06/27 0940) SpO2:  [82 %-100 %] 98 % (06/27 0940) Last BM Date: 12/12/20  Intake/Output from previous day: 06/26 0701 - 06/27 0700 In: 2553.4 [I.V.:1700; NG/GT:150; IV Piggyback:703.4] Out: 1680 [Urine:780; Emesis/NG output:850; Blood:50] Intake/Output this shift: Total I/O In: 160 [P.O.:30; NG/GT:30; IV Piggyback:100] Out: -   PE: Gen:  Alert, NAD, pleasant Card:  Reg Pulm: Normal rate and effort Abd: Soft, mild distension, appropriately tender around incision without peritonitis.  There is almost what appears to be some subcu air of her abdomen. Midline wound with honeycomb dressing in place, c/d/I. +BS.  Ext:  SCDs in place Psych: A&Ox3 (self, place and situation - thought it was [place]) Skin: no rashes noted, warm and dry   Lab Results:  Recent Labs    12/13/2020 0004 12/16/20 0547  WBC 8.8 10.2  HGB 15.1* 10.9*  HCT 48.9* 36.3  PLT 185 139*   BMET Recent Labs    12/18/2020 0004 12/16/20 0422  NA 137 137  K 4.4 4.4  CL 94* 99  CO2 31 33*  GLUCOSE 166* 98  BUN 15 22  CREATININE 1.12* 1.13*  CALCIUM 9.9 8.9   PT/INR No results for input(s): LABPROT, INR in the last 72 hours. CMP     Component Value Date/Time   NA 137 12/16/2020 0422   K 4.4 12/16/2020 0422   CL 99 12/16/2020 0422   CO2 33 (H) 12/16/2020 0422   GLUCOSE 98 12/16/2020 0422   BUN 22 12/16/2020 0422   CREATININE 1.13 (H) 12/16/2020 0422   CALCIUM 8.9 12/16/2020 0422   PROT 8.1 12/02/2020 0004   ALBUMIN 4.3 12/07/2020 0004   AST 25 12/12/2020 0004   ALT 13 12/07/2020 0004   ALKPHOS 147 (H)  12/08/2020 0004   BILITOT 0.7 12/13/2020 0004   GFRNONAA 48 (L) 12/16/2020 0422   GFRAA >60 03/17/2020 1624   Lipase     Component Value Date/Time   LIPASE 23 12/18/2020 0004       Studies/Results: DG Abdomen 1 View  Result Date: 11/24/2020 CLINICAL DATA:  Bowel obstruction EXAM: ABDOMEN - 1 VIEW COMPARISON:  Contemporaneous abdominal CT FINDINGS: The enteric tube tip is at the distal stomach by CT. High-grade small bowel obstruction. Right nephrogram from prior CT. The left kidney is severely atrophic by CT. IMPRESSION: The enteric tube tip is at the distal stomach. Electronically Signed   By: Marnee Spring M.D.   On: 12/17/2020 04:48   CT ABDOMEN PELVIS W CONTRAST  Result Date: 12/05/2020 CLINICAL DATA:  Abdominal distension with bowel obstruction suspected EXAM: CT ABDOMEN AND PELVIS WITH CONTRAST TECHNIQUE: Multidetector CT imaging of the abdomen and pelvis was performed using the standard protocol following bolus administration of intravenous contrast. CONTRAST:  24mL OMNIPAQUE IOHEXOL 300 MG/ML  SOLN COMPARISON:  12/23/2017 FINDINGS: Lower chest: Marked pectus excavatum. Generous heart size. Extensive atheromatous calcification of the aorta. Pulmonary nodule in the right lower lobe on 2:2, stable from 2016 and benign. Hepatobiliary: No focal liver abnormality.No evidence of biliary obstruction or stone. Pancreas: Unremarkable. Spleen:  Unremarkable. Adrenals/Urinary Tract: Negative adrenals. Severe left renal atrophy, nonfunctional appearing. Left renal calculus measuring 13 mm. Punctate renal calculus on the right where there is also simple cystic densities. Right renal cortical lobulation from scarring. Unremarkable bladder. Stomach/Bowel: Dilated and gas/fluid-filled small bowel with transition point in the right lower abdomen where there is marked distortion and narrowing of bowel loops that are twisted upon themselves, as marked on series 2. Affected loops are thickened from  submucosal edema; noted elevated lactate. No perforation or focal area of non enhancement. There is an enteric tube with tip at the distal stomach. Vascular/Lymphatic: Mechanical appearing SMA branch distortion at the level of transition point. Diffuse atheromatous calcification of the aorta and branch vessels. No mass or adenopathy. Reproductive:No pathologic findings. Other: Low-density ascites considered reactive. No noted pneumoperitoneum Musculoskeletal: Scoliosis, osteopenia, and generalized spinal degeneration. IMPRESSION: 1. High-grade small bowel obstruction with right lower quadrant transition point where there is severe bowel loop distortion and narrowing. Bowel loops are notably edematous at the level of obstruction. 2. Notable degree of ascites for a bowel obstruction, also indicative of the severity. 3. Severe left renal atrophy with nonfunctional appearance. Electronically Signed   By: Marnee Spring M.D.   On: 05-Jan-2021 04:47   DG ABD ACUTE 2+V W 1V CHEST  Result Date: 01-05-21 CLINICAL DATA:  Abdominal distension, nausea, vomiting EXAM: DG ABDOMEN ACUTE WITH 1 VIEW CHEST COMPARISON:  None. FINDINGS: Lungs appear mildly hyperinflated. The lungs are clear. No pneumothorax or pleural effusion. Mild to moderate cardiomegaly is present. Pulmonary vascularity is normal. There is marked gas disc distension of a single loop of large bowel which appears displaced into the left upper quadrant suspicious for changes of underlying cecal volvulus. There is dilated loops of small bowel noted within the mid pelvis and left lower quadrant indicative of a a possible secondary small bowel obstruction. No free intraperitoneal gas. No acute bone abnormality. IMPRESSION: Possible cecal volvulus with secondary small bowel obstruction. Dedicated contrast enhanced CT examination of the abdomen and pelvis is recommended for further evaluation. Electronically Signed   By: Helyn Numbers MD   On: 05-Jan-2021 02:20     Anti-infectives: Anti-infectives (From admission, onward)    Start     Dose/Rate Route Frequency Ordered Stop   2021/01/05 1000  cefTRIAXone (ROCEPHIN) 2 g in sodium chloride 0.9 % 100 mL IVPB       See Hyperspace for full Linked Orders Report.   2 g 200 mL/hr over 30 Minutes Intravenous On call to O.R. 05-Jan-2021 0905 01-05-2021 1045   01-05-2021 1000  metroNIDAZOLE (FLAGYL) IVPB 500 mg       See Hyperspace for full Linked Orders Report.   500 mg 100 mL/hr over 60 Minutes Intravenous Every 8 hours 01-05-2021 0905          Assessment/Plan POD1 - s/p proximal colectomy with ileal resection, LOA for ileocolonic volvulus with closed-loop obstruction and necrosis - Dr. Michaell Cowing - 6/26 - Cont NGT and AROBF - Cont abx - Maintain foley today until mobilizing - Mobilize. PT - reports she lives at home alone and has recent falls. May need SNF - Pulm toilet - AM labs  FEN - NPO, NGT, IVF per [facility] VTE - SCDs, okay for chemical prophylaxis from a general surgery standpoint ID - Rocephin/Flagyl Foley - in place Follow-up - Dr. Michaell Cowing  HTN CKD3   LOS: 1 day    Jacinto Halim , [facility] [place] [facility] Surgery 12/16/2020, 10:05 AM Please see Amion for  pager number during day hours 7:00am-4:30pm

## 2020-12-17 LAB — URINALYSIS, ROUTINE W REFLEX MICROSCOPIC
Bilirubin Urine: NEGATIVE
Glucose, UA: NEGATIVE mg/dL
Ketones, ur: NEGATIVE mg/dL
Nitrite: NEGATIVE
Protein, ur: 30 mg/dL — AB
Specific Gravity, Urine: 1.023 (ref 1.005–1.030)
pH: 5 (ref 5.0–8.0)

## 2020-12-17 LAB — BASIC METABOLIC PANEL
Anion gap: 9 (ref 5–15)
BUN: 34 mg/dL — ABNORMAL HIGH (ref 8–23)
CO2: 30 mmol/L (ref 22–32)
Calcium: 8.7 mg/dL — ABNORMAL LOW (ref 8.9–10.3)
Chloride: 100 mmol/L (ref 98–111)
Creatinine, Ser: 0.95 mg/dL (ref 0.44–1.00)
GFR, Estimated: 59 mL/min — ABNORMAL LOW (ref >60)
Glucose, Bld: 74 mg/dL (ref 70–99)
Potassium: 5.1 mmol/L (ref 3.5–5.1)
Sodium: 139 mmol/L (ref 135–145)

## 2020-12-17 LAB — CBC
HCT: 37.3 % (ref 36.0–46.0)
Hemoglobin: 10.8 g/dL — ABNORMAL LOW (ref 12.0–15.0)
MCH: 29 pg (ref 26.0–34.0)
MCHC: 29 g/dL — ABNORMAL LOW (ref 30.0–36.0)
MCV: 100.3 fL — ABNORMAL HIGH (ref 80.0–100.0)
Platelets: 136 10*3/uL — ABNORMAL LOW (ref 150–400)
RBC: 3.72 MIL/uL — ABNORMAL LOW (ref 3.87–5.11)
RDW: 14 % (ref 11.5–15.5)
WBC: 11.3 10*3/uL — ABNORMAL HIGH (ref 4.0–10.5)
nRBC: 0 % (ref 0.0–0.2)

## 2020-12-17 LAB — GLUCOSE, CAPILLARY
Glucose-Capillary: 101 mg/dL — ABNORMAL HIGH (ref 70–99)
Glucose-Capillary: 66 mg/dL — ABNORMAL LOW (ref 70–99)

## 2020-12-17 MED ORDER — DEXTROSE 50 % IV SOLN
INTRAVENOUS | Status: AC
  Administered 2020-12-17: 50 mL
  Filled 2020-12-17: qty 50

## 2020-12-17 MED ORDER — SODIUM CHLORIDE 0.9 % IV SOLN
INTRAVENOUS | Status: DC | PRN
  Administered 2020-12-17 – 2020-12-21 (×3): 250 mL via INTRAVENOUS

## 2020-12-17 MED ORDER — SODIUM CHLORIDE 0.9 % IV BOLUS
500.0000 mL | Freq: Once | INTRAVENOUS | Status: AC
  Administered 2020-12-17: 500 mL via INTRAVENOUS

## 2020-12-17 MED ORDER — KETOROLAC TROMETHAMINE 30 MG/ML IJ SOLN
30.0000 mg | Freq: Three times a day (TID) | INTRAMUSCULAR | Status: DC | PRN
  Administered 2020-12-17 – 2020-12-20 (×5): 30 mg via INTRAVENOUS
  Filled 2020-12-17 (×6): qty 1

## 2020-12-17 MED ORDER — DEXTROSE-NACL 5-0.45 % IV SOLN: INTRAVENOUS | Status: DC

## 2020-12-17 MED ORDER — NALOXONE HCL 0.4 MG/ML IJ SOLN
INTRAMUSCULAR | Status: AC
  Filled 2020-12-17: qty 1

## 2020-12-17 MED ORDER — DEXTROSE 50 % IV SOLN
12.5000 g | INTRAVENOUS | Status: AC
  Administered 2020-12-17: 12.5 g via INTRAVENOUS

## 2020-12-17 NOTE — Progress Notes (Signed)
2 Days Post-Op  Subjective: CC: Patient confused this morning.  She asked for ice but otherwise does not answer any my questions or follow commands.  She is unable to tell me if she was in pain, nauseous or if she is passing flatus.  NG tube with bilious output.  Foley in place.  No BM documented.  Family at bedside.  Spoke with Dr. Lucianne Muss about patient's confusion-he has ordered a CT head.  Objective: Vital signs in last 24 hours: Temp:  [97.4 F (36.3 C)-99.1 F (37.3 C)] 98.4 F (36.9 C) (06/28 0528) Pulse Rate:  [85-108] 94 (06/28 0528) Resp:  [16-18] 18 (06/28 0528) BP: (92-123)/(50-69) 92/60 (06/28 0528) SpO2:  [96 %-100 %] 96 % (06/28 0528) Last BM Date: 12/12/20  Intake/Output from previous day: 06/27 0701 - 06/28 0700 In: 1110 [P.O.:120; I.V.:500; NG/GT:90; IV Piggyback:400] Out: 800 [Urine:700; Emesis/NG output:100] Intake/Output this shift: No intake/output data recorded.  PE: Gen: Frail, elderly female who appears confused Card:  Reg Pulm: Normal rate and effort Abd: Soft, mild distension. Patient does not show any pain with facial expressions or report any tenderness when palpating her abdomen.  No rigidity or guarding.  No peritonitis.  Midline wound with honeycomb dressing in place, c/d/I. +BS. NGT in place with bilious output  Ext:  SCDs in place Neuro: Opens eyes, does not follow commands or answer orientation questions. Skin: no rashes noted, warm and dry  Lab Results:  Recent Labs    12/16/20 0547 12/17/20 0401  WBC 10.2 11.3*  HGB 10.9* 10.8*  HCT 36.3 37.3  PLT 139* 136*   BMET Recent Labs    12/16/20 0422 12/17/20 0401  NA 137 139  K 4.4 5.1  CL 99 100  CO2 33* 30  GLUCOSE 98 74  BUN 22 34*  CREATININE 1.13* 0.95  CALCIUM 8.9 8.7*   PT/INR No results for input(s): LABPROT, INR in the last 72 hours. CMP     Component Value Date/Time   NA 139 12/17/2020 0401   K 5.1 12/17/2020 0401   CL 100 12/17/2020 0401   CO2 30 12/17/2020  0401   GLUCOSE 74 12/17/2020 0401   BUN 34 (H) 12/17/2020 0401   CREATININE 0.95 12/17/2020 0401   CALCIUM 8.7 (L) 12/17/2020 0401   PROT 8.1 01/10/2021 0004   ALBUMIN 4.3 01-10-2021 0004   AST 25 2021/01/10 0004   ALT 13 2021/01/10 0004   ALKPHOS 147 (H) 01/10/2021 0004   BILITOT 0.7 01/10/21 0004   GFRNONAA 59 (L) 12/17/2020 0401   GFRAA >60 03/17/2020 1624   Lipase     Component Value Date/Time   LIPASE 23 01/10/2021 0004       Studies/Results: No results found.  Anti-infectives: Anti-infectives (From admission, onward)    Start     Dose/Rate Route Frequency Ordered Stop   12/17/20 0015  cefoTEtan (CEFOTAN) 2 g in sodium chloride 0.9 % 100 mL IVPB        2 g 200 mL/hr over 30 Minutes Intravenous Every 12 hours 12/16/20 2316 12/17/20 0254   12/16/20 1100  cefTRIAXone (ROCEPHIN) 2 g in sodium chloride 0.9 % 100 mL IVPB        2 g 200 mL/hr over 30 Minutes Intravenous Daily 12/16/20 1010     01/10/21 1000  cefTRIAXone (ROCEPHIN) 2 g in sodium chloride 0.9 % 100 mL IVPB       See Hyperspace for full Linked Orders Report.   2 g 200 mL/hr  over 30 Minutes Intravenous On call to O.R. 12/07/2020 0905 12/04/2020 1045   12/10/2020 1000  metroNIDAZOLE (FLAGYL) IVPB 500 mg       See Hyperspace for full Linked Orders Report.   500 mg 100 mL/hr over 60 Minutes Intravenous Every 8 hours 12/14/2020 0905          Assessment/Plan POD2 - s/p proximal colectomy with ileal resection, LOA for ileocolonic volvulus with closed-loop obstruction and necrosis - Dr. Michaell Cowing - 6/26 - Cont NGT and AROBF - Cont abx - Maintain foley today in setting of confusion - Mobilize. PT - reports she lives at home alone and has recent falls. May need SNF - Pulm toilet - AM labs   FEN - NPO, NGT, IVF per [facility] VTE - SCDs, okay for chemical prophylaxis from a general surgery standpoint ID - Rocephin/Flagyl Foley - in place Follow-up - Dr. Michaell Cowing   Confusion - spoke with [facility]. They are evaluating and  getting CTH HTN CKD3   LOS: 2 days    Jacinto Halim , [facility] Surgery 12/17/2020, 10:02 AM Please see Amion for pager number during day hours 7:00am-4:30pm

## 2020-12-17 NOTE — Progress Notes (Addendum)
PROGRESS NOTE    Amber Rowland  TML:[id number] DOB: 04/23/1934 DOA: 01-06-2021 PCP: Marcine Matar, MD   Brief Narrative:  This 87 years old female with PMH significant for prior small bowel obstruction required lysis of adhesions in 2013, severe malnutrition, hypertension, CKD stage III presented in the ED with complaints of severe abdominal pain, distention associated with nausea and vomiting and symptoms has been ongoing for last 2 days.She has no bowel movement in several days.  CT abdomen consistent with high-grade small bowel obstruction with transition point in the right lower quadrant. Patient is admitted for small bowel obstruction, NG tube was inserted,  showed coffee-ground emesis.  General surgery was consulted, Patient underwent exploratory laparotomy on 6/26, tolerated well. Patient became lethargic after receiving a dose of dilaudid, no responding, Narcan was given, She opened eyes and had a bowel movement.  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 consistent with high-grade small bowel obstruction with transition point in right lower quadrant. Continue NG tube, NPO, IV hydration. Adequate pain control with pain medications. Avoid opioids General surgery consulted,  She underwent exploratory laparotomy with lysis of  adhesions, s/p proximal colectomy with ileal resection,  Tolerated well, Post Op day 2. Continue Zofran for nausea and vomiting. Continue antibiotics ceftriaxone and Flagyl. Continue Foley catheter until mobilizing. PT and OT evaluation , may needs SNF  Hypertension Continue amlodipine.  Severe malnutrition Apparently very chronic.  CKD stage IIIb:  Serum creatinine at baseline.  Avoid nephrotoxic medications.  Acute encephalopathy: This could be sec.to dilaudid, Morphine,  gasping, not responsive. She opened eyes after Narcan, had bowel movement. Obtain CT head, Avoid opioids for pain control. Consider Toradol.  DVT prophylaxis:  SCDs Code Status: DNR Family Communication: No family at bed side. Disposition Plan:   Status is: Inpatient  Remains inpatient appropriate because:Inpatient level of care appropriate due to severity of illness  Dispo: The patient is from: Home              Anticipated d/c is to: SNF              Patient currently is not medically stable to d/c.   Difficult to place patient No  Consultants:  General surgery  Procedures:  Exploratory Laparotomy Antimicrobials:  Anti-infectives (From admission, onward)    Start     Dose/Rate Route Frequency Ordered Stop   12/17/20 0015  cefoTEtan (CEFOTAN) 2 g in sodium chloride 0.9 % 100 mL IVPB        2 g 200 mL/hr over 30 Minutes Intravenous Every 12 hours 12/16/20 2316 12/17/20 0254   12/16/20 1100  cefTRIAXone (ROCEPHIN) 2 g in sodium chloride 0.9 % 100 mL IVPB        2 g 200 mL/hr over 30 Minutes Intravenous Daily 12/16/20 1010     January 06, 2021 1000  cefTRIAXone (ROCEPHIN) 2 g in sodium  chloride 0.9 % 100 mL IVPB       See Hyperspace for full Linked Orders Report.   2 g 200 mL/hr over 30 Minutes Intravenous On call to O.R. 12/03/2020 0905 11/20/2020 1045   11/23/2020 1000  metroNIDAZOLE (FLAGYL) IVPB 500 mg       See Hyperspace for full Linked Orders Report.   500 mg 100 mL/hr over 60 Minutes Intravenous  Every 8 hours 12/17/2020 0905          Subjective: Patient was seen and examined at bedside,  overnight events noted.   Patient was lethargic, gasping, Narcan given and she opened eyes, awake, had a bowel movement afterwards.  She has NG tube with brownish drainage.   Objective: Vitals:   12/16/20 2129 12/17/20 0137 12/17/20 0528 12/17/20 1320  BP: 123/69 (!) 116/50 92/60 (!) 91/55  Pulse: (!) 108 92 94 94  Resp: 16 18 18    Temp: 99.1 F (37.3 C) (!) 97.4 F (36.3 C) 98.4 F (36.9 C)   TempSrc: Oral Oral Oral   SpO2: 100% 100% 96% 97%  Weight:      Height:        Intake/Output Summary (Last 24 hours) at 12/17/2020 1405 Last data filed at 12/17/2020 1000 Gross per 24 hour  Intake 690 ml  Output 870 ml  Net -180 ml   Filed Weights   12/07/2020 0007 12/08/2020 0620  Weight: 37 kg 35.6 kg    Examination:  General exam: Appears lethargic , lying in the bed, not responsive. She opened eyes after Narcan, Respiratory system: Clear to auscultation. Respiratory effort normal. Cardiovascular system: S1 & S2 heard, RRR. No JVD, murmurs, rubs, gallops or clicks. No pedal edema. Gastrointestinal system: Abdomen is distended, soft and tender.  Dressing noted, No organomegaly or masses felt.  NO bowel sounds heard. Central nervous system: Alert and oriented. No focal neurological deficits. Extremities: Symmetric 5 x 5 power. No edema, no cyanosis, no clubbing.  Skin: No rashes, lesions or ulcers Psychiatry: Judgement and insight appear normal. Mood & affect appropriate.     Data Reviewed: I have personally reviewed following labs and imaging studies  CBC: Recent Labs  Lab 12/09/2020 0004 12/16/20 0547 12/17/20 0401  WBC 8.8 10.2 11.3*  NEUTROABS 7.8*  --   --   HGB 15.1* 10.9* 10.8*  HCT 48.9* 36.3 37.3  MCV 93.1 96.5 100.3*  PLT 185 139* 136*   Basic Metabolic Panel: Recent Labs  Lab 11/23/2020 0004 12/16/20 0422 12/17/20 0401  NA 137 137 139  K 4.4 4.4 5.1  CL 94* 99  100  CO2 31 33* 30  GLUCOSE 166* 98 74  BUN 15 22 34*  CREATININE 1.12* 1.13* 0.95  CALCIUM 9.9 8.9 8.7*  MG  --  2.0  --   PHOS  --  4.5  --    GFR: Estimated Creatinine Clearance: 24.3 mL/min (by C-G formula based on SCr of 0.95 mg/dL). Liver Function Tests: Recent Labs  Lab 12/08/2020 0004  AST 25  ALT 13  ALKPHOS 147*  BILITOT 0.7  PROT 8.1  ALBUMIN 4.3   Recent Labs  Lab 11/21/2020 0004  LIPASE 23   No results for input(s): AMMONIA in the last 168 hours. Coagulation Profile: No results for input(s): INR, PROTIME in the last 168 hours. Cardiac Enzymes: No results for input(s): CKTOTAL, CKMB, CKMBINDEX, TROPONINI in the last 168 hours. BNP (last 3 results) No results for input(s): PROBNP in the last 8760 hours. HbA1C: No results  for input(s): HGBA1C in the last 72 hours. CBG: Recent Labs  Lab 12/17/20 0837 12/17/20 0924  GLUCAP 66* 101*   Lipid Profile: No results for input(s): CHOL, HDL, LDLCALC, TRIG, CHOLHDL, LDLDIRECT in the last 72 hours. Thyroid Function Tests: No results for input(s): TSH, T4TOTAL, FREET4, T3FREE, THYROIDAB in the last 72 hours. Anemia Panel: No results for input(s): VITAMINB12, FOLATE, FERRITIN, TIBC, IRON, RETICCTPCT in the last 72 hours. Sepsis Labs: Recent Labs  Lab 11/25/2020 0004 12/11/2020 0528 11/28/2020 0743 11/29/2020 1246  LATICACIDVEN 3.1* 4.9* 3.7* 5.3*    Recent Results (from the past 240 hour(s))  Resp Panel by RT-PCR (Flu A&B, Covid) Nasopharyngeal Swab     Status: None   Collection Time: 11/26/2020  5:28 AM   Specimen: Nasopharyngeal Swab; Nasopharyngeal(NP) swabs in vial transport medium  Result Value Ref Range Status   SARS Coronavirus 2 by RT PCR NEGATIVE NEGATIVE Final    Comment: (NOTE) SARS-CoV-2 target nucleic acids are NOT DETECTED.  The SARS-CoV-2 RNA is generally detectable in upper respiratory specimens during the acute phase of infection. The lowest concentration of SARS-CoV-2 viral copies this assay can  detect is 138 copies/mL. A negative result does not preclude SARS-Cov-2 infection and should not be used as the sole basis for treatment or other patient management decisions. A negative result may occur with  improper specimen collection/handling, submission of specimen other than nasopharyngeal swab, presence of viral mutation(s) within the areas targeted by this assay, and inadequate number of viral copies(<138 copies/mL). A negative result must be combined with clinical observations, patient history, and epidemiological information. The expected result is Negative.  Fact Sheet for Patients:  BloggerCourse.com  Fact Sheet for Healthcare Providers:  SeriousBroker.it  This test is no t yet approved or cleared by the [place] FDA and  has been authorized for detection and/or diagnosis of SARS-CoV-2 by FDA under an Emergency Use Authorization (EUA). This EUA will remain  in effect (meaning this test can be used) for the duration of the COVID-19 declaration under Section 564(b)(1) of the Act, 21 U.S.C.section 360bbb-3(b)(1), unless the authorization is terminated  or revoked sooner.       Influenza A by PCR NEGATIVE NEGATIVE Final   Influenza B by PCR NEGATIVE NEGATIVE Final    Comment: (NOTE) The Xpert Xpress SARS-CoV-2/FLU/RSV plus assay is intended as an aid in the diagnosis of influenza from Nasopharyngeal swab specimens and should not be used as a sole basis for treatment. Nasal washings and aspirates are unacceptable for Xpert Xpress SARS-CoV-2/FLU/RSV testing.  Fact Sheet for Patients: BloggerCourse.com  Fact Sheet for Healthcare Providers: SeriousBroker.it  This test is not yet approved or cleared by the [place] FDA and has been authorized for detection and/or diagnosis of SARS-CoV-2 by FDA under an Emergency Use Authorization (EUA). This EUA will remain in  effect (meaning this test can be used) for the duration of the COVID-19 declaration under Section 564(b)(1) of the Act, 21 U.S.C. section 360bbb-3(b)(1), unless the authorization is terminated or revoked.  Performed at [facility], [address] [place], [place] [address]   Surgical pcr screen     Status: None   Collection Time: 11/24/2020  8:54 AM   Specimen: Nasal Mucosa; Nasal Swab  Result Value Ref Range Status   MRSA, PCR NEGATIVE NEGATIVE Final   Staphylococcus aureus NEGATIVE NEGATIVE Final    Comment: (NOTE) The Xpert SA Assay (FDA approved for NASAL specimens in patients 43 years of age and older), is one  component of a comprehensive surveillance program. It is not intended to diagnose infection nor to guide or monitor treatment. Performed at [facility], [address] [place], [place] [address]     Radiology Studies: No results found.  Scheduled Meds:  alvimopan  12 mg Oral BID   amLODipine  7.5 mg Oral Daily   bisacodyl  10 mg Rectal Daily   brimonidine  1 drop Both Eyes BID   Chlorhexidine Gluconate Cloth  6 each Topical Daily   dorzolamide  1 drop Both Eyes BID   enoxaparin (LOVENOX) injection  30 mg Subcutaneous Q24H   feeding supplement  237 mL Oral BID BM   lip balm  1 application Topical BID   Continuous Infusions:  cefTRIAXone (ROCEPHIN)  IV 2 g (12/17/20 1031)   dextrose 5 % and 0.45% NaCl 75 mL/hr at 12/17/20 1023   lactated ringers     lactated ringers Stopped (Jan 14, 2021 1152)   lactated ringers 100 mL/hr at 01-14-21 0803   lactated ringers     lactated ringers 100 mL/hr at 12/16/20 1631   methocarbamol (ROBAXIN) IV     metronidazole 500 mg (12/17/20 1202)   ondansetron (ZOFRAN) IV       LOS: 2 days    Time spent: 35 mins    Aseel Uhde, MD [facility]   If 7PM-7AM, please contact night-coverage

## 2020-12-17 NOTE — Care Plan (Signed)
Spoke with the daughter, explained in detail about her condition.   Daughter has decided to make her DNR/DNI given her age and other comorbid conditions.

## 2020-12-17 NOTE — Progress Notes (Signed)
PT Cancellation Note  Patient Details Name: Amber Rowland MRN: [id number] DOB: 11-04-1933   Cancelled Treatment:    Reason Eval/Treat Not Completed: Fatigue/lethargy limiting ability to participate    Faye Ramsay, PT Acute Rehabilitation  Office: [phone] Pager: [phone]

## 2020-12-18 ENCOUNTER — Inpatient Hospital Stay (HOSPITAL_COMMUNITY): Payer: Medicare Other

## 2020-12-18 DIAGNOSIS — K562 Volvulus: Principal | ICD-10-CM

## 2020-12-18 LAB — BASIC METABOLIC PANEL
Anion gap: 9 (ref 5–15)
BUN: 44 mg/dL — ABNORMAL HIGH (ref 8–23)
CO2: 29 mmol/L (ref 22–32)
Calcium: 8.4 mg/dL — ABNORMAL LOW (ref 8.9–10.3)
Chloride: 101 mmol/L (ref 98–111)
Creatinine, Ser: 1.52 mg/dL — ABNORMAL HIGH (ref 0.44–1.00)
GFR, Estimated: 33 mL/min — ABNORMAL LOW (ref >60)
Glucose, Bld: 119 mg/dL — ABNORMAL HIGH (ref 70–99)
Potassium: 4.3 mmol/L (ref 3.5–5.1)
Sodium: 139 mmol/L (ref 135–145)

## 2020-12-18 LAB — CBC
HCT: 30.3 % — ABNORMAL LOW (ref 36.0–46.0)
Hemoglobin: 8.8 g/dL — ABNORMAL LOW (ref 12.0–15.0)
MCH: 29 pg (ref 26.0–34.0)
MCHC: 29 g/dL — ABNORMAL LOW (ref 30.0–36.0)
MCV: 100 fL (ref 80.0–100.0)
Platelets: 140 10*3/uL — ABNORMAL LOW (ref 150–400)
RBC: 3.03 MIL/uL — ABNORMAL LOW (ref 3.87–5.11)
RDW: 13.9 % (ref 11.5–15.5)
WBC: 7.8 10*3/uL (ref 4.0–10.5)
nRBC: 0 % (ref 0.0–0.2)

## 2020-12-18 LAB — PHOSPHORUS: Phosphorus: 3.5 mg/dL (ref 2.5–4.6)

## 2020-12-18 LAB — IRON AND TIBC
Iron: 13 ug/dL — ABNORMAL LOW (ref 28–170)
Saturation Ratios: 5 % — ABNORMAL LOW (ref 10.4–31.8)
TIBC: 257 ug/dL (ref 250–450)
UIBC: 244 ug/dL

## 2020-12-18 LAB — SURGICAL PATHOLOGY

## 2020-12-18 LAB — FERRITIN: Ferritin: 61 ng/mL (ref 11–307)

## 2020-12-18 LAB — MAGNESIUM: Magnesium: 2.5 mg/dL — ABNORMAL HIGH (ref 1.7–2.4)

## 2020-12-18 MED ORDER — SODIUM CHLORIDE 0.9 % IV SOLN
510.0000 mg | Freq: Once | INTRAVENOUS | Status: AC
  Administered 2020-12-18: 510 mg via INTRAVENOUS
  Filled 2020-12-18: qty 510

## 2020-12-18 MED ORDER — SODIUM CHLORIDE 0.9 % IV BOLUS
500.0000 mL | Freq: Once | INTRAVENOUS | Status: AC
  Administered 2020-12-18: 500 mL via INTRAVENOUS

## 2020-12-18 NOTE — Progress Notes (Signed)
Pharmacy Brief Note - Alvimopan (Entereg)  The standing order set for alvimopan (Entereg) now includes an automatic order to discontinue the drug after the patient has had a bowel movement.  The change was approved by the Pharmacy & Therapeutics Committee and the Medical Executive Committee.    This patient has had a bowel movement documented by nursing.  Therefore, alvimopan has been discontinued.  If there are questions, please contact the pharmacy at [phone].  Thank you   Adalberto Cole, PharmD, BCPS 12/18/2020 9:24 PM

## 2020-12-18 NOTE — Progress Notes (Addendum)
3 Days Post-Op  Subjective: CC: More awake today. A&O x 3. Complains of some abdominal soreness around the incision. Nauseated. Bilious output from NGT. No flatus or bm. Foley in place.   Objective: Vital signs in last 24 hours: Temp:  [97.3 F (36.3 C)-98.5 F (36.9 C)] 97.3 F (36.3 C) (06/29 0510) Pulse Rate:  [75-97] 80 (06/29 0510) Resp:  [16-18] 18 (06/29 0510) BP: (91-111)/(55-68) 111/62 (06/29 0510) SpO2:  [95 %-100 %] 95 % (06/29 0510) Last BM Date: 12/17/20 - no reported BM per RN  Intake/Output from previous day: 06/28 0701 - 06/29 0700 In: 3205 [I.V.:2300.7; IV Piggyback:904.3] Out: 1490 [Urine:720; Emesis/NG output:770] Intake/Output this shift: No intake/output data recorded.  PE: Gen:  Alert, NAD, pleasant Card:  Reg Pulm: Normal rate and effort Abd: Soft, mild distension, appropriately tender around incision without peritonitis. Midline wound with honeycomb dressing in place, c/d/I. Hypoactive BS.  Ext:  No LE edema  Neuro: A&Ox3 (self, place and situation - thought it was 1962), moves all extremities Skin: no rashes noted, warm and dry  Lab Results:  Recent Labs    12/17/20 0401 12/18/20 0417  WBC 11.3* 7.8  HGB 10.8* 8.8*  HCT 37.3 30.3*  PLT 136* 140*   BMET Recent Labs    12/17/20 0401 12/18/20 0417  NA 139 139  K 5.1 4.3  CL 100 101  CO2 30 29  GLUCOSE 74 119*  BUN 34* 44*  CREATININE 0.95 1.52*  CALCIUM 8.7* 8.4*   PT/INR No results for input(s): LABPROT, INR in the last 72 hours. CMP     Component Value Date/Time   NA 139 12/18/2020 0417   K 4.3 12/18/2020 0417   CL 101 12/18/2020 0417   CO2 29 12/18/2020 0417   GLUCOSE 119 (H) 12/18/2020 0417   BUN 44 (H) 12/18/2020 0417   CREATININE 1.52 (H) 12/18/2020 0417   CALCIUM 8.4 (L) 12/18/2020 0417   PROT 8.1 12/19/2020 0004   ALBUMIN 4.3 12/14/2020 0004   AST 25 12/16/2020 0004   ALT 13 12/11/2020 0004   ALKPHOS 147 (H) 12/09/2020 0004   BILITOT 0.7 12/08/2020 0004    GFRNONAA 33 (L) 12/18/2020 0417   GFRAA >60 03/17/2020 1624   Lipase     Component Value Date/Time   LIPASE 23 12/02/2020 0004       Studies/Results: No results found.  Anti-infectives: Anti-infectives (From admission, onward)    Start     Dose/Rate Route Frequency Ordered Stop   12/17/20 0015  cefoTEtan (CEFOTAN) 2 g in sodium chloride 0.9 % 100 mL IVPB        2 g 200 mL/hr over 30 Minutes Intravenous Every 12 hours 12/16/20 2316 12/17/20 0254   12/16/20 1100  cefTRIAXone (ROCEPHIN) 2 g in sodium chloride 0.9 % 100 mL IVPB        2 g 200 mL/hr over 30 Minutes Intravenous Daily 12/16/20 1010     11/22/2020 1000  cefTRIAXone (ROCEPHIN) 2 g in sodium chloride 0.9 % 100 mL IVPB       See Hyperspace for full Linked Orders Report.   2 g 200 mL/hr over 30 Minutes Intravenous On call to O.R. 12/05/2020 0905 12/14/2020 1045   11/23/2020 1000  metroNIDAZOLE (FLAGYL) IVPB 500 mg       See Hyperspace for full Linked Orders Report.   500 mg 100 mL/hr over 60 Minutes Intravenous Every 8 hours 11/20/2020 0905          Assessment/Plan  POD 3 - s/p proximal colectomy with ileal resection, LOA for ileocolonic volvulus with closed-loop obstruction and necrosis - Dr. Michaell Cowing - 6/26 - Cont NGT and AROBF - Cont abx for 5 days post op - Discontinue foley - Mobilize. PT rec SNF - Pulm toilet - AM labs   FEN - NPO, NGT, IVF per [facility] VTE - SCDs, Lovenox ID - Rocephin/Flagyl Foley - discontinue  Follow-up - Dr. Michaell Cowing   Confusion - CTH pending. Improved this am, more conversant and A&O x 3 which is similar to when I saw her POD 1. Avoid narcotics. Family has decided to make her DNR/DNI per [facility] note. ABL anemia - hgb 15.1 > 10.9 > 10.8 > 8.8, monitor. AM labs.  HTN AKI w/ CKD3 - Cr 1.52 from 0.95   LOS: 3 days    Jacinto Halim , [facility] Surgery 12/18/2020, 11:29 AM Please see Amion for pager number during day hours 7:00am-4:30pm

## 2020-12-18 NOTE — Progress Notes (Signed)
Pt accidentally pulled NG tube out when moving around in the bed. Attempted to put NG tube back in but was unsuccessful d/t pt being confused and not following commands. Will pass on to day shift. Pt resting comfortably in bed, no c/o abdominal pain or N/V at this time.

## 2020-12-18 NOTE — [place] FL2 (Signed)
[place] MEDICAID FL2 LEVEL OF CARE SCREENING TOOL     IDENTIFICATION  Patient Name: Amber Rowland Birthdate: 07-29-1933 Sex: female Admission Date (Current Location): 01-08-21  [place] and [place] Number:  Haynes Bast [id number] T Facility and Address:  [facility],  [address], [place] [address]      Provider Number: [id number]  Attending Physician Name and Address:  Burnadette Pop, MD  Relative Name and Phone Number:  Mare Ferrari (daughter) Ph: [phone]    Current Level of Care: Hospital Recommended Level of Care: Skilled Nursing Facility Prior Approval Number:    Date Approved/Denied:   PASRR Number: [id number]  Discharge Plan: SNF    Current Diagnoses: Patient Active Problem List   Diagnosis Date Noted   SBO (small bowel obstruction) (HCC) 01-08-2021   History of small bowel obstruction 2021-01-08   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 Jan 08, 2021 01-08-2021   Pain due to onychomycosis of toenails of both feet 11/27/2020   Scoliosis of cervicothoracic spine 01/11/2020   Primary osteoarthritis involving multiple joints 01/11/2020   Urge incontinence of urine 01/11/2020   History of falling 01/11/2020   Pressure ulcer 02/14/2015   Esophageal dysmotility 02/14/2015   Atherosclerosis of aorta With small ruptured plaque 02/14/2015   Abdominal distention    Bloating    Dysphagia    Pulmonary nodule, right    Left renal artery stenosis (HCC) 02/13/2015   Dysphasia 02/13/2015   Acute kidney injury (HCC) 02/13/2015   Stage III chronic kidney disease (HCC) 02/13/2015   Pancreatic mass 02/13/2015   Protein-calorie malnutrition, severe (HCC) 09/18/2013   Murmur 09/17/2013   Chest pain 09/16/2013   Glaucoma 09/16/2013   S/P laparotomy 06/10/2012   Tobacco abuse 04/12/2012   HTN (hypertension) 04/12/2012   Benign breast cysts in female 04/12/2012   Anxiety 04/12/2012   Lung nodules, ?incidental 04/12/2012   Chronic  constipation    COPD (chronic obstructive pulmonary disease) (HCC)     Orientation RESPIRATION BLADDER Height & Weight     Self  O2 (2L/min) Continent Weight: 78 lb 7.7 oz (35.6 kg) Height:  5' (152.4 cm)  BEHAVIORAL SYMPTOMS/MOOD NEUROLOGICAL BOWEL NUTRITION STATUS      Incontinent Diet  AMBULATORY STATUS COMMUNICATION OF NEEDS Skin   Limited Assist Verbally Surgical wounds, Other (Comment) (Abrasion: left arm and elbow)                       Personal Care Assistance Level of Assistance  Bathing, Feeding, Dressing Bathing Assistance: Limited assistance Feeding assistance: Independent Dressing Assistance: Limited assistance     Functional Limitations Info  Sight, Hearing, Speech Sight Info: Impaired (Patient has glaucoma) Hearing Info: Adequate Speech Info: Adequate    SPECIAL CARE FACTORS FREQUENCY  PT (By licensed PT), OT (By licensed OT)     PT Frequency: 5x's/week OT Frequency: 5x's/week            Contractures Contractures Info: Not present    Additional Factors Info  Code Status, Allergies Code Status Info: DNR Allergies Info: Advair Hfa (Fluticasone-salmeterol), Symbicort (Budesonide-formoterol Fumarate)           Current Medications (12/18/2020):  This is the current hospital active medication list Current Facility-Administered Medications  Medication Dose Route Frequency Provider Last Rate Last Admin   0.9 %  sodium chloride infusion   Intravenous PRN Cipriano Bunker, MD 10 mL/hr at 12/17/20 1741 250 mL at 12/17/20 1741   acetaminophen (TYLENOL) tablet 650 mg  650 mg  Oral Q6H PRN Karie Soda, MD       Or   acetaminophen (TYLENOL) suppository 650 mg  650 mg Rectal Q6H PRN Karie Soda, MD       albuterol (PROVENTIL) (2.5 MG/3ML) 0.083% nebulizer solution 2.5 mg  2.5 mg Nebulization Q6H PRN Karie Soda, MD       alum & mag hydroxide-simeth (MAALOX/MYLANTA) 200-200-20 MG/5ML suspension 30 mL  30 mL Oral Q6H PRN Karie Soda, MD       alvimopan  (ENTEREG) capsule 12 mg  12 mg Oral BID Karie Soda, MD   12 mg at 12/18/20 0918   amLODipine (NORVASC) tablet 7.5 mg  7.5 mg Oral Daily Karie Soda, MD       bisacodyl (DULCOLAX) suppository 10 mg  10 mg Rectal Daily Karie Soda, MD   10 mg at 12/18/20 0919   brimonidine (ALPHAGAN) 0.15 % ophthalmic solution 1 drop  1 drop Both Eyes BID Karie Soda, MD   1 drop at 12/18/20 0921   cefTRIAXone (ROCEPHIN) 2 g in sodium chloride 0.9 % 100 mL IVPB  2 g Intravenous Daily Jacinto Halim, PA-C 200 mL/hr at 12/18/20 0915 2 g at 12/18/20 0915   Chlorhexidine Gluconate Cloth 2 % PADS 6 each  6 each Topical Daily Karie Soda, MD   6 each at 12/18/20 0918   dextrose 5 %-0.45 % sodium chloride infusion   Intravenous Continuous Cipriano Bunker, MD 75 mL/hr at 12/17/20 1915 Restarted at 12/17/20 1915   diphenhydrAMINE (BENADRYL) injection 12.5-25 mg  12.5-25 mg Intravenous Q6H PRN Karie Soda, MD       dorzolamide (TRUSOPT) 2 % ophthalmic solution 1 drop  1 drop Both Eyes BID Karie Soda, MD   1 drop at 12/18/20 5427   enalaprilat (VASOTEC) injection 0.625-1.25 mg  0.625-1.25 mg Intravenous Q6H PRN Karie Soda, MD       enoxaparin (LOVENOX) injection 30 mg  30 mg Subcutaneous Q24H Karie Soda, MD   30 mg at 12/18/20 0930   feeding supplement (ENSURE SURGERY) liquid 237 mL  237 mL Oral BID BM Karie Soda, MD       ferumoxytol Duncan Dull) 510 mg in sodium chloride 0.9 % 100 mL IVPB  510 mg Intravenous Once Burnadette Pop, MD       hydrALAZINE (APRESOLINE) injection 10 mg  10 mg Intravenous Q8H PRN Karie Soda, MD       ketorolac (TORADOL) 30 MG/ML injection 30 mg  30 mg Intravenous Q8H PRN Cipriano Bunker, MD   30 mg at 12/18/20 0623   lactated ringers bolus 1,000 mL  1,000 mL Intravenous Q8H PRN Karie Soda, MD       lactated ringers infusion   Intravenous Continuous Karie Soda, MD   Stopped at 2021/01/08 1152   lactated ringers infusion   Intravenous Continuous Karie Soda, MD 100 mL/hr  at 01/08/2021 0803 Restarted at 2021-01-08 1035   lip balm (CARMEX) ointment 1 application  1 application Topical BID Karie Soda, MD   1 application at 12/18/20 7628   magic mouthwash  15 mL Oral QID PRN Karie Soda, MD       menthol-cetylpyridinium (CEPACOL) lozenge 3 mg  1 lozenge Oral PRN Karie Soda, MD       methocarbamol (ROBAXIN) 1,000 mg in dextrose 5 % 100 mL IVPB  1,000 mg Intravenous Q6H PRN Karie Soda, MD       metoprolol tartrate (LOPRESSOR) injection 5 mg  5 mg Intravenous Q6H PRN Karie Soda, MD  metroNIDAZOLE (FLAGYL) IVPB 500 mg  500 mg Intravenous Trixie Deis, MD 100 mL/hr at 12/18/20 0917 500 mg at 12/18/20 0917   ondansetron (ZOFRAN) injection 4 mg  4 mg Intravenous Q6H PRN Karie Soda, MD       Or   ondansetron (ZOFRAN) 8 mg in sodium chloride 0.9 % 50 mL IVPB  8 mg Intravenous Q6H PRN Karie Soda, MD       ondansetron [facility]) tablet 4 mg  4 mg Oral Q6H PRN Karie Soda, MD       phenol (CHLORASEPTIC) mouth spray 2 spray  2 spray Mouth/Throat PRN Karie Soda, MD       prochlorperazine (COMPAZINE) injection 5-10 mg  5-10 mg Intravenous Q4H PRN Karie Soda, MD       simethicone (MYLICON) 40 MG/0.6ML suspension 80 mg  80 mg Oral QID PRN Karie Soda, MD         Discharge Medications: Please see discharge summary for a list of discharge medications.  Relevant Imaging Results:  Relevant Lab Results:   Additional Information SSN: [id number]  Ewing Schlein, LCSW

## 2020-12-18 NOTE — Evaluation (Signed)
Physical Therapy Evaluation Patient Details Name: Amber Rowland MRN: [id number] DOB: 13-Apr-1934 Today's Date: 12/18/2020   History of Present Illness  Pt is an 87 year old female admitted for abdominal pain and distention.  CT abdomen consistent with high-grade small bowel obstruction with transition point in the right lower quadrant.  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 Michaell Cowing - 6/26.  PMHx significant for COPD, smoking, advanced lower cervical degenerative disc disease, HTN, glaucoma, chronic kidney disease  Clinical Impression  Pt admitted with above diagnosis. Pt currently with functional limitations due to the deficits listed below (see PT Problem List). Pt will benefit from skilled PT to increase their independence and safety with mobility to allow discharge to the venue listed below.  Pt confused but agreeable to assist up to recliner.   Pt with decreased cognition so deferred ambulation for safety at this time.  Pt may need SNF upon d/c however will continue to update d/c recommendations if/when pt progresses.     Follow Up Recommendations SNF    Equipment Recommendations  Rolling walker with 5" wheels;3in1 (PT)    Recommendations for Other Services       Precautions / Restrictions Precautions Precautions: Fall Precaution Comments: currently on O2 [place], abdominal incision      Mobility  Bed Mobility Overal bed mobility: Needs Assistance Bed Mobility: Supine to Sit     Supine to sit: Min assist     General bed mobility comments: cues for log roll technique however pt sat upright in bed and then brought LEs over EOB, assist to scoot    Transfers Overall transfer level: Needs assistance Equipment used: Rolling walker (2 wheeled) Transfers: Sit to/from [employer] Sit to Stand: Min assist Stand pivot transfers: Min assist       General transfer comment:  assist to rise and steady, requiring cues for technique and safety, assisted to recliner  Ambulation/Gait             General Gait Details: deferred ambulation due to cognition (would need at least chair follow for safety)  Stairs            Wheelchair Mobility    Modified Rankin (Stroke Patients Only)       Balance Overall balance assessment: Needs assistance         Standing balance support: Bilateral upper extremity supported Standing balance-Leahy Scale: Poor Standing balance comment: reliant on UE support                             Pertinent Vitals/Pain Pain Assessment: Faces Faces Pain Scale: Hurts little more Pain Location: abdomen Pain Descriptors / Indicators: Sore;Grimacing;Guarding Pain Intervention(s): Monitored during session;Repositioned    Home Living Family/patient expects to be discharged to:: Private residence Living Arrangements: Children (lives with son)               Additional Comments: pt poor historian however does report she lives with her son, requesting to speak to her "mom" and then states, "no my daughter" (attempted to call per number on white board in room however no answer)    Prior Function Level of Independence: Needs assistance   Gait / Transfers Assistance Needed: likely household ambulator           Hand Dominance        Extremity/Trunk Assessment   Upper Extremity Assessment Upper Extremity  Assessment: Generalized weakness    Lower Extremity Assessment Lower Extremity Assessment: Generalized weakness       Communication   Communication: HOH  Cognition Arousal/Alertness: Awake/alert   Overall Cognitive Status: Impaired/Different from baseline Area of Impairment: Orientation;Safety/judgement;Following commands;Problem solving                 Orientation Level: Disoriented to;Place;Time;Situation     Following Commands: Follows one step commands inconsistently;Follows one  step commands with increased time Safety/Judgement: Decreased awareness of deficits;Decreased awareness of safety   Problem Solving: Slow processing;Difficulty sequencing;Requires verbal cues General Comments: pt not orienated and reorientated, able to recall being in hospital after reorientation however not situation, pt requesting her mother and then her daughter, appears confused; no family to report baseline      General Comments      Exercises     Assessment/Plan    PT Assessment Patient needs continued PT services  PT Problem List Decreased balance;Decreased activity tolerance;Decreased strength;Decreased mobility;Decreased knowledge of use of DME;Pain;Decreased cognition       PT Treatment Interventions Gait training;DME instruction;Balance training;Therapeutic exercise;Functional mobility training;Therapeutic activities;Patient/family education    PT Goals (Current goals can be found in the Care Plan section)  Acute Rehab PT Goals PT Goal Formulation: Patient unable to participate in goal setting Time For Goal Achievement: 01/02/21 Potential to Achieve Goals: Fair    Frequency Min 2X/week   Barriers to discharge        Co-evaluation               AM-PAC PT "6 Clicks" Mobility  Outcome Measure Help needed turning from your back to your side while in a flat bed without using bedrails?: A Little Help needed moving from lying on your back to sitting on the side of a flat bed without using bedrails?: A Little Help needed moving to and from a bed to a chair (including a wheelchair)?: A Little Help needed standing up from a chair using your arms (e.g., wheelchair or bedside chair)?: A Little Help needed to walk in hospital room?: A Little Help needed climbing 3-5 steps with a railing? : A Little 6 Click Score: 18    End of Session Equipment Utilized During Treatment: Gait belt;Oxygen Activity Tolerance: Patient tolerated treatment well Patient left: in  chair;with call bell/phone within reach;with chair alarm set Nurse Communication: Mobility status PT Visit Diagnosis: Other abnormalities of gait and mobility (R26.89);Muscle weakness (generalized) (M62.81)    Time: 2297-9892 PT Time Calculation (min) (ACUTE ONLY): 14 min   Charges:   PT Evaluation $PT Eval Low Complexity: 1 Low        Kati PT, DPT Acute Rehabilitation Services Pager: [phone] Office: [phone]   Mikeria Valin,KATHrine E 12/18/2020, 10:06 AM

## 2020-12-18 NOTE — Progress Notes (Addendum)
PROGRESS NOTE    Amber Rowland  BTD:[id number] DOB: 10/26/1933 DOA: 12/27/2020 PCP: Marcine Matar, MD   Chief Complain: Abdominal pain, nausea and vomiting  Brief Narrative: Patient is 87 year old female with history of small bowel obstruction requiring lysis of adhesions in 2013, malnutrition, hypertension, CKD stage III who presented to the emergency department with complaints of severe abdominal pain, distention, nausea/vomiting.  CT abdomen showed high-grade small bowel obstruction with transition point at the right lower quadrant.  Patient was admitted for SBO, NG tube inserted.  General surgery consulted and following.  Underwent expiratory laparotomy on 6/26.  Hospital course remarkable for lethargic after receiving dose of Dilaudid, improvement with Narcan.   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  CT abdomen showed high-grade small bowel obstruction with transition point at the right lower quadrant.  Patient was admitted for SBO, NG tube inserted.  General surgery consulted and following.  Underwent expiratory laparotomy on 6/26,s/p proximal colectomy with ileal resection, Continue antiemetics, pain management, IV fluids Also on IV antibiotics, plan to continue for 5 days. Currently NPO  Hypertension: Continue amlodipine  Severe  malnutrition: Nutrition team will be consulted when diet will be started  CKD stage IIIb: Currently kidney function at baseline.  Avoid nephrotoxins  Normocytic anemia: Hemoglobin dropped to the range of 8, continue monitoring.  No evidence of active bleeding.  Iron studies show severe iron deficiency.  She will be given a dose of IV iron infusion.  Oral supplementation will also be started on discharge..  Thrombocytopenia: Likely chronic.  Continue to monitor  Acute encephalopathy/lethargy: Patient is very sensitive to opioids.  Became lethargic on 12/17/2020 after receiving a dose of Dilaudid.  Symptoms improved with Narcan.  Avoid opiates as much as possible.  Goals of care/debility/deconditioning: DNR.  PT/OT evaluation done, recommended skilled nursing facility  on discharge.          DVT prophylaxis: SCD Code Status: DNR Family Communication:: Discussed with daughter on phone on 6/29 Status is: Inpatient  Remains inpatient appropriate because:Inpatient level of care appropriate due to severity of illness  Dispo: The patient is from: Home              Anticipated d/c is to: SNF              Patient currently is not medically stable to d/c.   Difficult to place patient No     Consultants: GI  Procedures: Exploratory laparotomy  Antimicrobials:  Anti-infectives (From admission, onward)    Start     Dose/Rate Route Frequency Ordered Stop   12/17/20 0015  cefoTEtan (CEFOTAN) 2 g in sodium chloride 0.9 % 100 mL IVPB        2 g 200 mL/hr over 30 Minutes Intravenous Every 12 hours 12/16/20 2316 12/17/20 0254   12/16/20 1100  cefTRIAXone (ROCEPHIN) 2 g in sodium chloride 0.9 %  100 mL IVPB        2 g 200 mL/hr over 30 Minutes Intravenous Daily 12/16/20 1010     12/10/2020 1000  cefTRIAXone (ROCEPHIN) 2 g in sodium chloride 0.9 % 100 mL IVPB       See Hyperspace for full Linked Orders Report.   2 g 200 mL/hr over 30 Minutes Intravenous On call to O.R. 12/11/2020 0905 12/13/2020  1045   12/14/2020 1000  metroNIDAZOLE (FLAGYL) IVPB 500 mg       See Hyperspace for full Linked Orders Report.   500 mg 100 mL/hr over 60 Minutes Intravenous Every 8 hours 12/13/2020 0905         Subjective:  Patient seen and examined the bedside this morning.  She was sitting on the chair.  Overall comfortable but complains of severe abdominal pain on the surgical site.  Her mental status much improved from yesterday.  She was communicative and knew where she is.  I called the daughter and discussed about our plan.   Objective: Vitals:   12/17/20 1320 12/17/20 1830 12/17/20 2059 12/18/20 0510  BP: (!) 91/55 106/68 (!) 100/56 111/62  Pulse: 94 97 75 80  Resp:  16 18 18   Temp:  98 F (36.7 C) 98.5 F (36.9 C) (!) 97.3 F (36.3 C)  TempSrc:  Oral Oral Oral  SpO2: 97% 99% 100% 95%  Weight:      Height:        Intake/Output Summary (Last 24 hours) at 12/18/2020 0831 Last data filed at 12/18/2020 0600 Gross per 24 hour  Intake 3205 ml  Output 1490 ml  Net 1715 ml   Filed Weights   12/10/2020 0007 11/20/2020 0620  Weight: 37 kg 35.6 kg    Examination:  General exam: Very deconditioned, debilitated elderly female  HEENT:PERRL,Oral mucosa moist, Ear/Nose normal on gross exam Respiratory system: Bilateral equal air entry, normal vesicular breath sounds, no wheezes or crackles  Cardiovascular system: S1 & S2 heard, RRR. No JVD, murmurs, rubs, gallops or clicks. No pedal edema. Gastrointestinal system: Abdomen is nondistended, soft and is appropriately tender.  Midline surgical wound covered with dressing .Bowel sounds heard Central nervous system: Alert and awake . Extremities: No edema, no clubbing ,no cyanosis Skin: No rashes, lesions or ulcers,no icterus ,no pallor   Data Reviewed: I have personally reviewed following labs and imaging studies  CBC: Recent Labs  Lab 12/19/2020 0004 12/16/20 0547 12/17/20 0401 12/18/20 0417  WBC 8.8 10.2 11.3* 7.8  NEUTROABS 7.8*  --   --    --   HGB 15.1* 10.9* 10.8* 8.8*  HCT 48.9* 36.3 37.3 30.3*  MCV 93.1 96.5 100.3* 100.0  PLT 185 139* 136* 140*   Basic Metabolic Panel: Recent Labs  Lab 12/10/2020 0004 12/16/20 0422 12/17/20 0401 12/18/20 0417  NA 137 137 139 139  K 4.4 4.4 5.1 4.3  CL 94* 99 100 101  CO2 31 33* 30 29  GLUCOSE 166* 98 74 119*  BUN 15 22 34* 44*  CREATININE 1.12* 1.13* 0.95 1.52*  CALCIUM 9.9 8.9 8.7* 8.4*  MG  --  2.0  --  2.5*  PHOS  --  4.5  --  3.5   GFR: Estimated Creatinine Clearance: 15.2 mL/min (A) (by C-G formula based on SCr of 1.52 mg/dL (H)). Liver Function Tests: Recent Labs  Lab 12/18/2020 0004  AST 25  ALT 13  ALKPHOS 147*  BILITOT 0.7  PROT 8.1  ALBUMIN 4.3   Recent Labs  Lab 11/24/2020 0004  LIPASE 23   No results for input(s): AMMONIA in the last 168 hours. Coagulation Profile: No results for input(s): INR, PROTIME in the last 168 hours. Cardiac Enzymes: No results for input(s): CKTOTAL, CKMB, CKMBINDEX, TROPONINI in the last 168 hours. BNP (last 3 results) No results for input(s): PROBNP in the last 8760 hours. HbA1C: No results for input(s): HGBA1C in the last 72 hours. CBG: Recent Labs  Lab 12/17/20 0837 12/17/20 0924  GLUCAP 66* 101*   Lipid Profile: No results for input(s): CHOL, HDL, LDLCALC, TRIG, CHOLHDL, LDLDIRECT in the last 72 hours. Thyroid Function Tests: No results for input(s): TSH, T4TOTAL, FREET4, T3FREE, THYROIDAB in the last 72 hours. Anemia Panel: No results for input(s): VITAMINB12, FOLATE, FERRITIN, TIBC, IRON, RETICCTPCT in the last 72 hours. Sepsis Labs: Recent Labs  Lab 11/26/2020 0004 11/30/2020 0528 12/06/2020 0743 12/01/2020 1246  LATICACIDVEN 3.1* 4.9* 3.7* 5.3*    Recent Results (from the past 240 hour(s))  Resp Panel by RT-PCR (Flu A&B, Covid) Nasopharyngeal Swab     Status: None   Collection Time: 12/07/2020  5:28 AM   Specimen: Nasopharyngeal Swab; Nasopharyngeal(NP) swabs in vial transport medium  Result Value Ref Range  Status   SARS Coronavirus 2 by RT PCR NEGATIVE NEGATIVE Final    Comment: (NOTE) SARS-CoV-2 target nucleic acids are NOT DETECTED.  The SARS-CoV-2 RNA is generally detectable in upper respiratory specimens during the acute phase of infection. The lowest concentration of SARS-CoV-2 viral copies this assay can detect is 138 copies/mL. A negative result does not preclude SARS-Cov-2 infection and should not be used as the sole basis for treatment or other patient management decisions. A negative result may occur with  improper specimen collection/handling, submission of specimen other than nasopharyngeal swab, presence of viral mutation(s) within the areas targeted by this assay, and inadequate number of viral copies(<138 copies/mL). A negative result must be combined with clinical observations, patient history, and epidemiological information. The expected result is Negative.  Fact Sheet for Patients:  BloggerCourse.com  Fact Sheet for Healthcare Providers:  SeriousBroker.it  This test is no t yet approved or cleared by the [place] FDA and  has been authorized for detection and/or diagnosis of SARS-CoV-2 by FDA under an Emergency Use Authorization (EUA). This EUA will remain  in effect (meaning this test can be used) for the duration of the COVID-19 declaration under Section 564(b)(1) of the Act, 21 U.S.C.section 360bbb-3(b)(1), unless the authorization is terminated  or revoked sooner.       Influenza A by PCR NEGATIVE NEGATIVE Final   Influenza B by PCR NEGATIVE NEGATIVE Final    Comment: (NOTE) The Xpert Xpress SARS-CoV-2/FLU/RSV plus assay is intended as an aid in the diagnosis of influenza from Nasopharyngeal swab specimens and should not be used as a sole basis for treatment. Nasal washings and aspirates are unacceptable for Xpert Xpress SARS-CoV-2/FLU/RSV testing.  Fact Sheet for  Patients: BloggerCourse.com  Fact Sheet for Healthcare Providers: SeriousBroker.it  This test is not yet approved or cleared by the [place] FDA and has been authorized for detection and/or diagnosis of SARS-CoV-2 by FDA under an Emergency Use Authorization (EUA). This EUA will remain in effect (meaning this test can be used) for the duration of the COVID-19 declaration under Section 564(b)(1) of the Act, 21 U.S.C. section 360bbb-3(b)(1), unless the authorization is terminated or revoked.  Performed at [facility], [address], [place], [place] [address]   Surgical pcr screen  Status: None   Collection Time: 11/25/2020  8:54 AM   Specimen: Nasal Mucosa; Nasal Swab  Result Value Ref Range Status   MRSA, PCR NEGATIVE NEGATIVE Final   Staphylococcus aureus NEGATIVE NEGATIVE Final    Comment: (NOTE) The Xpert SA Assay (FDA approved for NASAL specimens in patients 85 years of age and older), is one component of a comprehensive surveillance program. It is not intended to diagnose infection nor to guide or monitor treatment. Performed at [facility], [address] [place], [place] [id number]          Radiology Studies: No results found.      Scheduled Meds:  alvimopan  12 mg Oral BID   amLODipine  7.5 mg Oral Daily   bisacodyl  10 mg Rectal Daily   brimonidine  1 drop Both Eyes BID   Chlorhexidine Gluconate Cloth  6 each Topical Daily   dorzolamide  1 drop Both Eyes BID   enoxaparin (LOVENOX) injection  30 mg Subcutaneous Q24H   feeding supplement  237 mL Oral BID BM   lip balm  1 application Topical BID   Continuous Infusions:  sodium chloride 250 mL (12/17/20 1741)   cefTRIAXone (ROCEPHIN)  IV 2 g (12/17/20 1031)   dextrose 5 % and 0.45% NaCl 75 mL/hr at 12/17/20 1915   lactated ringers     lactated ringers Stopped (12/02/2020 1152)   lactated ringers 100 mL/hr at  12/12/2020 0803   lactated ringers 100 mL/hr at 12/16/20 1631   methocarbamol (ROBAXIN) IV     metronidazole 500 mg (12/18/20 0208)   ondansetron (ZOFRAN) IV       LOS: 3 days    Time spent: 25 mins.More than 50% of that time was spent in counseling and/or coordination of care.      Burnadette PopAmrit Maheen Cwikla, MD [facility] P6/29/2022, 8:31 AM

## 2020-12-18 NOTE — Care Management Important Message (Signed)
Important Message  Patient Details IM Letter placed in Patient's room. Name: Amber Rowland MRN: [id number] Date of Birth: [birth year]   Medicare Important Message Given:  Yes     Caren Macadam 12/18/2020, 1:49 PM

## 2020-12-18 NOTE — Progress Notes (Signed)
Patient has not voided since foley cath was removed at 0936. Bladder scanned the patient. Bladder scann shows . Dr. Romie Levee on call notified. MD order to give patient of Normal Saline as bolus.

## 2020-12-18 NOTE — TOC Initial Note (Signed)
Transition of Care [name]) - Initial/Assessment Note   Patient Details  Name: Amber Rowland MRN: [id number] Date of Birth: [birth year]  Transition of Care [facility]) CM/SW Contact:    Ewing Schlein, LCSW Phone Number: 12/18/2020, 11:35 AM  Clinical Narrative: Patient was admitted for necrotic volvulus of ileum & colon. PT evaluation recommended SNF. CSW spoke with patient's daughter regarding recommendations due to patient being oriented x1. Family is agreeable to SNF.  FL2 done; PASRR received. Initial referral faxed out. TOC awaiting bed offers.  Expected Discharge Plan: Skilled Nursing Facility Barriers to Discharge: Continued Medical Work up, SNF Pending bed offer  Patient Goals and CMS Choice Patient states their goals for this hospitalization and ongoing recovery are:: Get stronger before returning home CMS Medicare.gov Compare Post Acute Care list provided to:: Patient Represenative (must comment) Choice offered to / list presented to : Adult Children  Expected Discharge Plan and Services Expected Discharge Plan: Skilled Nursing Facility In-house Referral: Clinical Social Work Post Acute Care Choice: Skilled Nursing Facility Living arrangements for the past 2 months: Single Family Home             DME Arranged: N/A DME Agency: NA  Prior Living Arrangements/Services Living arrangements for the past 2 months: Single Family Home Lives with:: Adult Children Patient language and need for interpreter reviewed:: Yes Do you feel safe going back to the place where you live?: Yes      Need for Family Participation in Patient Care: Yes (Comment) Care giver support system in place?: Yes (comment) Criminal Activity/Legal Involvement Pertinent to Current Situation/Hospitalization: No - Comment as needed  Activities of Daily Living Home Assistive Devices/Equipment: Eyeglasses ADL Screening (condition at time of admission) Patient's cognitive ability adequate to safely complete daily  activities?: No Is the patient deaf or have difficulty hearing?: No Does the patient have difficulty seeing, even when wearing glasses/contacts?: Yes Does the patient have difficulty concentrating, remembering, or making decisions?: Yes Patient able to express need for assistance with ADLs?: Yes Does the patient have difficulty dressing or bathing?: No Independently performs ADLs?: Yes (appropriate for developmental age) Does the patient have difficulty walking or climbing stairs?: Yes Weakness of Legs: Both Weakness of Arms/Hands: None  Permission Sought/Granted Permission sought to share information with : Facility Industrial/product designer granted to share information with : Yes, Verbal Permission Granted Permission granted to share info w AGENCY: SNFs  Emotional Assessment Attitude/Demeanor/Rapport: Unable to Assess Affect (typically observed): Unable to Assess Orientation: : Oriented to Self Alcohol / Substance Use: Not Applicable  Admission diagnosis:  SBO (small bowel obstruction) (HCC) [K56.609] Cecal volvulus (HCC) [K56.2] Patient Active Problem List   Diagnosis Date Noted   SBO (small bowel obstruction) (HCC) 12/14/2020   History of small bowel obstruction 12/11/2020   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 12/08/2020 11/24/2020   Pain due to onychomycosis of toenails of both feet 11/27/2020   Scoliosis of cervicothoracic spine 01/11/2020   Primary osteoarthritis involving multiple joints 01/11/2020   Urge incontinence of urine 01/11/2020   History of falling 01/11/2020   Pressure ulcer 02/14/2015   Esophageal dysmotility 02/14/2015   Atherosclerosis of aorta With small ruptured plaque 02/14/2015   Abdominal distention    Bloating    Dysphagia    Pulmonary nodule, right    Left renal artery stenosis (HCC) 02/13/2015   Dysphasia 02/13/2015   Acute kidney injury (HCC) 02/13/2015   Stage III chronic kidney disease (HCC) 02/13/2015   Pancreatic  mass 02/13/2015  Protein-calorie malnutrition, severe (HCC) 09/18/2013   Murmur 09/17/2013   Chest pain 09/16/2013   Glaucoma 09/16/2013   S/P laparotomy 06/10/2012   Tobacco abuse 04/12/2012   HTN (hypertension) 04/12/2012   Benign breast cysts in female 04/12/2012   Anxiety 04/12/2012   Lung nodules, ?incidental 04/12/2012   Chronic constipation    COPD (chronic obstructive pulmonary disease) (HCC)    PCP:  Marcine Matar, MD Pharmacy:   [facility] [address] [place] [facility] [address] [place] [address] Phone: [phone] Fax: [phone]  [facility] [address] [place] [address] Phone: [phone] Fax: [phone]  [facility] [address] [place] [address] Phone: [phone] Fax: [phone]  [id number] [place], [place] - [address] AT [facility] [address] [place] [address] Phone: [phone] Fax: [phone]  Readmission Risk Interventions No flowsheet data found.

## 2020-12-19 ENCOUNTER — Inpatient Hospital Stay (HOSPITAL_COMMUNITY): Payer: Medicare Other

## 2020-12-19 LAB — CBC WITH DIFFERENTIAL/PLATELET
Abs Immature Granulocytes: 0.05 10*3/uL (ref 0.00–0.07)
Basophils Absolute: 0 10*3/uL (ref 0.0–0.1)
Basophils Relative: 0 %
Eosinophils Absolute: 0 10*3/uL (ref 0.0–0.5)
Eosinophils Relative: 0 %
HCT: 31.7 % — ABNORMAL LOW (ref 36.0–46.0)
Hemoglobin: 9.7 g/dL — ABNORMAL LOW (ref 12.0–15.0)
Immature Granulocytes: 1 %
Lymphocytes Relative: 6 %
Lymphs Abs: 0.5 10*3/uL — ABNORMAL LOW (ref 0.7–4.0)
MCH: 29.2 pg (ref 26.0–34.0)
MCHC: 30.6 g/dL (ref 30.0–36.0)
MCV: 95.5 fL (ref 80.0–100.0)
Monocytes Absolute: 0.4 10*3/uL (ref 0.1–1.0)
Monocytes Relative: 6 %
Neutro Abs: 6.9 10*3/uL (ref 1.7–7.7)
Neutrophils Relative %: 87 %
Platelets: 151 10*3/uL (ref 150–400)
RBC: 3.32 MIL/uL — ABNORMAL LOW (ref 3.87–5.11)
RDW: 13.9 % (ref 11.5–15.5)
WBC: 7.8 10*3/uL (ref 4.0–10.5)
nRBC: 0 % (ref 0.0–0.2)

## 2020-12-19 LAB — BASIC METABOLIC PANEL
Anion gap: 7 (ref 5–15)
BUN: 39 mg/dL — ABNORMAL HIGH (ref 8–23)
CO2: 29 mmol/L (ref 22–32)
Calcium: 8.4 mg/dL — ABNORMAL LOW (ref 8.9–10.3)
Chloride: 103 mmol/L (ref 98–111)
Creatinine, Ser: 1.1 mg/dL — ABNORMAL HIGH (ref 0.44–1.00)
GFR, Estimated: 49 mL/min — ABNORMAL LOW (ref >60)
Glucose, Bld: 112 mg/dL — ABNORMAL HIGH (ref 70–99)
Potassium: 3.6 mmol/L (ref 3.5–5.1)
Sodium: 139 mmol/L (ref 135–145)

## 2020-12-19 LAB — BRAIN NATRIURETIC PEPTIDE: B Natriuretic Peptide: 919.3 pg/mL — ABNORMAL HIGH (ref 0.0–100.0)

## 2020-12-19 MED ORDER — FUROSEMIDE 10 MG/ML IJ SOLN
20.0000 mg | Freq: Two times a day (BID) | INTRAMUSCULAR | Status: DC
  Administered 2020-12-19: 20 mg via INTRAVENOUS
  Filled 2020-12-19: qty 2

## 2020-12-19 MED ORDER — MIDODRINE HCL 5 MG PO TABS
5.0000 mg | ORAL_TABLET | Freq: Three times a day (TID) | ORAL | Status: DC
  Administered 2020-12-20 – 2020-12-22 (×5): 5 mg via ORAL
  Filled 2020-12-19 (×5): qty 1

## 2020-12-19 NOTE — Progress Notes (Signed)
4 Days Post-Op  Subjective: CC: A&O x 4 today. Some abdominal pain. Nausea overnight. None currently. Denies emesis. Passing flatus. BM overnight   Objective: Vital signs in last 24 hours: Temp:  [97.3 F (36.3 C)] 97.3 F (36.3 C) (06/30 0619) Pulse Rate:  [84-107] 99 (06/30 0619) Resp:  [17-20] 20 (06/30 0619) BP: (118-140)/(68-91) 140/91 (06/30 0619) SpO2:  [89 %-94 %] 92 % (06/30 0619) Last BM Date: 12/19/20  Intake/Output from previous day: 06/29 0701 - 06/30 0700 In: 2170.5 [P.O.:60; I.V.:1636.7; IV Piggyback:473.8] Out: 0  Intake/Output this shift: No intake/output data recorded.  PE: Gen:  Alert, NAD, pleasant Card:  Reg Pulm: Normal rate and effort Abd: Soft, mild distension, appropriately tender around incision without peritonitis. Midline wound c/d/I. + BS.  Neuro: A&Ox4  Skin: no rashes noted, warm and dry    Lab Results:  Recent Labs    12/18/20 0417 12/19/20 0417  WBC 7.8 7.8  HGB 8.8* 9.7*  HCT 30.3* 31.7*  PLT 140* 151   BMET Recent Labs    12/18/20 0417 12/19/20 0417  NA 139 139  K 4.3 3.6  CL 101 103  CO2 29 29  GLUCOSE 119* 112*  BUN 44* 39*  CREATININE 1.52* 1.10*  CALCIUM 8.4* 8.4*   PT/INR No results for input(s): LABPROT, INR in the last 72 hours. CMP     Component Value Date/Time   NA 139 12/19/2020 0417   K 3.6 12/19/2020 0417   CL 103 12/19/2020 0417   CO2 29 12/19/2020 0417   GLUCOSE 112 (H) 12/19/2020 0417   BUN 39 (H) 12/19/2020 0417   CREATININE 1.10 (H) 12/19/2020 0417   CALCIUM 8.4 (L) 12/19/2020 0417   PROT 8.1 2020-12-22 0004   ALBUMIN 4.3 12-22-20 0004   AST 25 22-Dec-2020 0004   ALT 13 22-Dec-2020 0004   ALKPHOS 147 (H) 12-22-2020 0004   BILITOT 0.7 Dec 22, 2020 0004   GFRNONAA 49 (L) 12/19/2020 0417   GFRAA >60 03/17/2020 1624   Lipase     Component Value Date/Time   LIPASE 23 12-22-20 0004       Studies/Results: CT HEAD WO CONTRAST  Result Date: 12/18/2020 CLINICAL DATA:  Delirium  EXAM: CT HEAD WITHOUT CONTRAST TECHNIQUE: Contiguous axial images were obtained from the base of the skull through the vertex without intravenous contrast. COMPARISON:  None. FINDINGS: Brain: There is no mass, hemorrhage or extra-axial collection. The size and configuration of the ventricles and extra-axial CSF spaces are normal. There is hypoattenuation of the white matter, most commonly indicating chronic small vessel disease. Vascular: No abnormal hyperdensity of the major intracranial arteries or dural venous sinuses. No intracranial atherosclerosis. Skull: The visualized skull base, calvarium and extracranial soft tissues are normal. Sinuses/Orbits: No fluid levels or advanced mucosal thickening of the visualized paranasal sinuses. No mastoid or middle ear effusion. The orbits are normal. IMPRESSION: Chronic small vessel disease without acute intracranial abnormality. Electronically Signed   By: Deatra Robinson M.D.   On: 12/18/2020 22:16    Anti-infectives: Anti-infectives (From admission, onward)    Start     Dose/Rate Route Frequency Ordered Stop   12/17/20 0015  cefoTEtan (CEFOTAN) 2 g in sodium chloride 0.9 % 100 mL IVPB        2 g 200 mL/hr over 30 Minutes Intravenous Every 12 hours 12/16/20 2316 12/17/20 0254   12/16/20 1100  cefTRIAXone (ROCEPHIN) 2 g in sodium chloride 0.9 % 100 mL IVPB  2 g 200 mL/hr over 30 Minutes Intravenous Daily 12/16/20 1010     2021-01-12 1000  cefTRIAXone (ROCEPHIN) 2 g in sodium chloride 0.9 % 100 mL IVPB       See Hyperspace for full Linked Orders Report.   2 g 200 mL/hr over 30 Minutes Intravenous On call to O.R. 2021/01/12 0905 01-12-2021 1045   2021/01/12 1000  metroNIDAZOLE (FLAGYL) IVPB 500 mg       See Hyperspace for full Linked Orders Report.   500 mg 100 mL/hr over 60 Minutes Intravenous Every 8 hours 2021/01/12 0905          Assessment/Plan POD 4 - s/p proximal colectomy with ileal resection, LOA for ileocolonic volvulus with closed-loop  obstruction and necrosis - Dr. Michaell Cowing - 6/26 - Start CLD - Cont abx for 5 days post op - Mobilize. PT rec SNF - Pulm toilet - AM labs   FEN - CLD, IVF per TRH VTE - SCDs, Lovenox ID - Rocephin/Flagyl Foley - out Follow-up - Dr. Michaell Cowing   Confusion - CTH negative. Improved this am. Avoid narcotics. Family has decided to make her DNR/DNI per TRH note. ABL anemia - hgb 15.1 > 10.9 > 10.8 > 8.8 > 8.7, monitor. AM labs. HTN AKI w/ CKD3 - Cr 0.95 > 1.52 > 1.1, improved. AM labs.    LOS: 4 days    Jacinto Halim , [facility] 12/19/2020, 10:27 AM Please see Amion for pager number during day hours 7:00am-4:30pm

## 2020-12-19 NOTE — Plan of Care (Signed)
  Problem: Safety: Goal: Ability to remain free from injury will improve Outcome: Progressing   Problem: Coping: Goal: Level of anxiety will decrease Outcome: Progressing   Problem: Pain Managment: Goal: General experience of comfort will improve Outcome: Progressing   

## 2020-12-19 NOTE — Significant Event (Signed)
I was reported that patient became hypotensive at around 5 PM.  Patient seen and examined at the bedside.  Her mental status was same like in the morning.  She did not complain me of any worsening shortness of breath or chest pain. Her pulse was strong and regular during my evaluation.  EKG had shown A. fib which could be a transient event.  Her BNP is severely elevated, most likely she is volume overloaded but we cannot give her Lasix due to her low blood pressure. I will stop fluids. I will transfer the patient to progressive care unit. Patient is a DNR. Discussed with daughter at the bedside

## 2020-12-19 NOTE — Progress Notes (Signed)
Patient incontinent of both stool and urine, cleaned up and full linen change done, new sacral foam applied to sacrum, dressing changes done to right forearms skin tears x2, IV in right FA infiltrated- new site started in left FA, right arm elevated and ice applied to help with swelling, pt remains confused, will continue to monitor.

## 2020-12-19 NOTE — Progress Notes (Signed)
Tried to wean pt off oxygen, her sats dropped to 70.

## 2020-12-19 NOTE — Progress Notes (Signed)
Patient has has multiple episodes of incontinence both bowel and bladder. Patient bathed and linens changed. Went for Head CT this evening. Yells out, does not use the call bell. Honeycomb dressing to midline incision intact with moderate amount of bloody drainage. Upon assessing under sacral foam, a stage 2 ulcer was noted to the coccyx, cream applied and foam replaced, pillow support used to reduce pressure but patient is non-compliant with keeping the pillow in place. Patient able to turn and reposition in the bed by herself. Will continue to monitor.

## 2020-12-19 NOTE — TOC Progression Note (Signed)
Transition of Care [facility]) - Progression Note   Patient Details  Name: Amber Rowland MRN: [id number] Date of Birth: 06-29-1933  [facility] CM/SW Contact  Ewing Schlein, LCSW Phone Number: 12/19/2020, 4:03 PM  Clinical Narrative: CSW called patient's daughter to give her the bed offers. Daughter is undecided and would like to review the facilities this evening. TOC to follow.  Expected Discharge Plan: Skilled Nursing Facility Barriers to Discharge: Continued Medical Work up, SNF Pending bed offer  Expected Discharge Plan and Services Expected Discharge Plan: Skilled Nursing Facility In-house Referral: Clinical Social Work Post Acute Care Choice: Skilled Nursing Facility Living arrangements for the past 2 months: Single Family Home             DME Arranged: N/A DME Agency: NA  Readmission Risk Interventions No flowsheet data found.

## 2020-12-19 NOTE — Progress Notes (Addendum)
PROGRESS NOTE    Amber Rowland  BJY:[id number] DOB: 10/31/1933 DOA: 12/14/2020 PCP: Marcine MatarJohnson, Deborah B, MD   Chief Complain: Abdominal pain, nausea and vomiting  Brief Narrative: Patient is 87 year old female with history of small bowel obstruction requiring lysis of adhesions in 2013, malnutrition, hypertension, CKD stage III who presented to the emergency department with complaints of severe abdominal pain, distention, nausea/vomiting.  CT abdomen showed high-grade small bowel obstruction with transition point at the right lower quadrant.  Patient was admitted for SBO, NG tube inserted.  General surgery consulted and following.  Underwent expiratory laparotomy on 6/26.  Hospital course remarkable for lethargic after receiving dose of Dilaudid, improvement with Narcan.   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  CT abdomen showed high-grade small bowel obstruction with transition point at the right lower quadrant.  Patient was admitted for SBO, NG tube inserted.  General surgery consulted and following.  Underwent expiratory laparotomy on 6/26,s/p proximal colectomy with ileal resection, Continue antiemetics, pain management, IV fluids Also on IV antibiotics, plan to continue for 5 days postop. Started on clear liquid today  Acute hypoxic respiratory  failure/COPD: As per the RN, she rapidly desaturated on room air at sleep.  We are checking chest x-ray.  She does not use oxygen at baseline.She H/O COPD,not wheezing  at present.  Hypertension:On  amlodipine at home.  Continue PRN medications for severe hypertension.  Severe malnutrition: Nutrition team will be consulted when  solid diet will be started  CKD stage IIIb: Currently kidney function at baseline.  Avoid nephrotoxins  Normocytic anemia: Hemoglobin dropped to the range of 8, continue monitoring.  No evidence of active bleeding.  Iron studies show severe iron deficiency.  She will be given a dose of IV iron infusion.  Oral supplementation will also be started on discharge..  Thrombocytopenia: Likely chronic.  Continue to monitor  Acute encephalopathy/lethargy: Patient is very sensitive to opioids.  Became lethargic on 12/17/2020 after receiving a dose of Dilaudid.  Symptoms improved with Narcan.  Avoid opiates as much as possible.  CT head did not show any acute intracranial abnormalities, showed chronic small vessel disease.  This morning she was alert and oriented.  Goals of care/debility/deconditioning: DNR.  PT/OT evaluation done, recommended skilled nursing facility  on discharge.TOC following           DVT prophylaxis: SCD Code Status: DNR Family Communication:: Discussed with daughter on phone on 6/30  Status is: Inpatient  Remains inpatient appropriate because:Inpatient level of care appropriate due to severity of illness  Dispo: The patient is from: Home              Anticipated d/c is to: SNF              Patient currently is not medically stable to d/c.   Difficult to place patient No  Consultants: GI  Procedures: Exploratory laparotomy  Antimicrobials:  Anti-infectives (From admission, onward)    Start     Dose/Rate Route Frequency Ordered Stop   12/17/20 0015  cefoTEtan (CEFOTAN) 2 g in sodium chloride 0.9 % 100 mL IVPB        2 g 200 mL/hr  over 30 Minutes Intravenous Every 12 hours 12/16/20 2316 12/17/20 0254   12/16/20 1100  cefTRIAXone (ROCEPHIN) 2 g in sodium chloride 0.9 % 100 mL IVPB        2 g 200 mL/hr over 30 Minutes Intravenous Daily 12/16/20 1010     12/17/2020 1000  cefTRIAXone (ROCEPHIN) 2 g in sodium chloride 0.9 % 100 mL IVPB       See Hyperspace for full Linked Orders Report.   2 g 200 mL/hr over 30 Minutes Intravenous On call to O.R. 12/03/2020 0905 11/29/2020 1045   11/21/2020 1000  metroNIDAZOLE (FLAGYL) IVPB 500 mg       See Hyperspace for full Linked Orders Report.   500 mg 100 mL/hr over 60 Minutes Intravenous Every 8 hours 11/22/2020 0905         Subjective:  Patient seen and examined at the bedside this morning.  Hemodynamically stable.  Her mental status has improved today.  She is alert and oriented.  Denies any worsening of abdominal pain.  No nausea or vomiting.   Objective: Vitals:   12/18/20 1348 12/18/20 2305 12/18/20 2316 12/19/20 0619  BP: 118/68 136/76  (!) 140/91  Pulse: (!) 107 84  99  Resp: 17 20  20   Temp:    (!) 97.3 F (36.3 C)  TempSrc:    Oral  SpO2: 94% (!) 89% 93% 92%  Weight:      Height:        Intake/Output Summary (Last 24 hours) at 12/19/2020 0807 Last data filed at 12/19/2020 0600 Gross per 24 hour  Intake 2170.52 ml  Output 0 ml  Net 2170.52 ml   Filed Weights   12/02/2020 0007 12/19/2020 0620  Weight: 37 kg 35.6 kg    Examination:  General exam: Overall comfortable, not in distress, very deconditioned, debilitated, chronically looking HEENT: PERRL Respiratory system:  no wheezes or crackles  Cardiovascular system: S1 & S2 heard, RRR.  Gastrointestinal system: Abdomen is nondistended, soft and nontender.Fresh surgical wound.Slow bowel sounds Central nervous system: Alert and oriented Extremities: No edema, no clubbing ,no cyanosis Skin: No rashes, no ulcers,no icterus     Data Reviewed: I have personally reviewed following labs and imaging  studies  CBC: Recent Labs  Lab 12/06/2020 0004 12/16/20 0547 12/17/20 0401 12/18/20 0417 12/19/20 0417  WBC 8.8 10.2 11.3* 7.8 7.8  NEUTROABS 7.8*  --   --   --  6.9  HGB 15.1* 10.9* 10.8* 8.8* 9.7*  HCT 48.9* 36.3 37.3 30.3* 31.7*  MCV 93.1 96.5 100.3* 100.0 95.5  PLT 185 139* 136* 140* 151   Basic Metabolic Panel: Recent Labs  Lab 12/02/2020 0004 12/16/20 0422 12/17/20 0401 12/18/20 0417 12/19/20 0417  NA 137 137 139 139 139  K 4.4 4.4 5.1 4.3 3.6  CL 94* 99 100 101 103  CO2 31 33* 30 29 29   GLUCOSE 166* 98 74 119* 112*  BUN 15 22 34* 44* 39*  CREATININE 1.12* 1.13* 0.95 1.52* 1.10*  CALCIUM 9.9 8.9 8.7* 8.4* 8.4*  MG  --  2.0  --  2.5*  --   PHOS  --  4.5  --  3.5  --  GFR: Estimated Creatinine Clearance: 21 mL/min (A) (by C-G formula based on SCr of 1.1 mg/dL (H)). Liver Function Tests: Recent Labs  Lab 12/17/20 0004  AST 25  ALT 13  ALKPHOS 147*  BILITOT 0.7  PROT 8.1  ALBUMIN 4.3   Recent Labs  Lab Dec 17, 2020 0004  LIPASE 23   No results for input(s): AMMONIA in the last 168 hours. Coagulation Profile: No results for input(s): INR, PROTIME in the last 168 hours. Cardiac Enzymes: No results for input(s): CKTOTAL, CKMB, CKMBINDEX, TROPONINI in the last 168 hours. BNP (last 3 results) No results for input(s): PROBNP in the last 8760 hours. HbA1C: No results for input(s): HGBA1C in the last 72 hours. CBG: Recent Labs  Lab 12/17/20 0837 12/17/20 0924  GLUCAP 66* 101*   Lipid Profile: No results for input(s): CHOL, HDL, LDLCALC, TRIG, CHOLHDL, LDLDIRECT in the last 72 hours. Thyroid Function Tests: No results for input(s): TSH, T4TOTAL, FREET4, T3FREE, THYROIDAB in the last 72 hours. Anemia Panel: Recent Labs    12/18/20 0909  FERRITIN 61  TIBC 257  IRON 13*   Sepsis Labs: Recent Labs  Lab 17-Dec-2020 0004 12/17/2020 0528 2020-12-17 0743 December 17, 2020 1246  LATICACIDVEN 3.1* 4.9* 3.7* 5.3*    Recent Results (from the past 240 hour(s))  Resp  Panel by RT-PCR (Flu A&B, Covid) Nasopharyngeal Swab     Status: None   Collection Time: 17-Dec-2020  5:28 AM   Specimen: Nasopharyngeal Swab; Nasopharyngeal(NP) swabs in vial transport medium  Result Value Ref Range Status   SARS Coronavirus 2 by RT PCR NEGATIVE NEGATIVE Final    Comment: (NOTE) SARS-CoV-2 target nucleic acids are NOT DETECTED.  The SARS-CoV-2 RNA is generally detectable in upper respiratory specimens during the acute phase of infection. The lowest concentration of SARS-CoV-2 viral copies this assay can detect is 138 copies/mL. A negative result does not preclude SARS-Cov-2 infection and should not be used as the sole basis for treatment or other patient management decisions. A negative result may occur with  improper specimen collection/handling, submission of specimen other than nasopharyngeal swab, presence of viral mutation(s) within the areas targeted by this assay, and inadequate number of viral copies(<138 copies/mL). A negative result must be combined with clinical observations, patient history, and epidemiological information. The expected result is Negative.  Fact Sheet for Patients:  BloggerCourse.com  Fact Sheet for Healthcare Providers:  SeriousBroker.it  This test is no t yet approved or cleared by the [place] FDA and  has been authorized for detection and/or diagnosis of SARS-CoV-2 by FDA under an Emergency Use Authorization (EUA). This EUA will remain  in effect (meaning this test can be used) for the duration of the COVID-19 declaration under Section 564(b)(1) of the Act, 21 U.S.C.section 360bbb-3(b)(1), unless the authorization is terminated  or revoked sooner.       Influenza A by PCR NEGATIVE NEGATIVE Final   Influenza B by PCR NEGATIVE NEGATIVE Final    Comment: (NOTE) The Xpert Xpress SARS-CoV-2/FLU/RSV plus assay is intended as an aid in the diagnosis of influenza from Nasopharyngeal  swab specimens and should not be used as a sole basis for treatment. Nasal washings and aspirates are unacceptable for Xpert Xpress SARS-CoV-2/FLU/RSV testing.  Fact Sheet for Patients: BloggerCourse.com  Fact Sheet for Healthcare Providers: SeriousBroker.it  This test is not yet approved or cleared by the [place] FDA and has been authorized for detection and/or diagnosis of SARS-CoV-2 by FDA under an Emergency Use Authorization (EUA). This EUA will remain in  effect (meaning this test can be used) for the duration of the COVID-19 declaration under Section 564(b)(1) of the Act, 21 U.S.C. section 360bbb-3(b)(1), unless the authorization is terminated or revoked.  Performed at [facility], [address], [place], [place] [address]   Surgical pcr screen     Status: None   Collection Time: 12/05/2020  8:54 AM   Specimen: Nasal Mucosa; Nasal Swab  Result Value Ref Range Status   MRSA, PCR NEGATIVE NEGATIVE Final   Staphylococcus aureus NEGATIVE NEGATIVE Final    Comment: (NOTE) The Xpert SA Assay (FDA approved for NASAL specimens in patients 23 years of age and older), is one component of a comprehensive surveillance program. It is not intended to diagnose infection nor to guide or monitor treatment. Performed at [facility], [address] [place], [place] [address]          Radiology Studies: CT HEAD WO CONTRAST  Result Date: 12/18/2020 CLINICAL DATA:  Delirium EXAM: CT HEAD WITHOUT CONTRAST TECHNIQUE: Contiguous axial images were obtained from the base of the skull through the vertex without intravenous contrast. COMPARISON:  None. FINDINGS: Brain: There is no mass, hemorrhage or extra-axial collection. The size and configuration of the ventricles and extra-axial CSF spaces are normal. There is hypoattenuation of the white matter, most commonly indicating chronic small vessel  disease. Vascular: No abnormal hyperdensity of the major intracranial arteries or dural venous sinuses. No intracranial atherosclerosis. Skull: The visualized skull base, calvarium and extracranial soft tissues are normal. Sinuses/Orbits: No fluid levels or advanced mucosal thickening of the visualized paranasal sinuses. No mastoid or middle ear effusion. The orbits are normal. IMPRESSION: Chronic small vessel disease without acute intracranial abnormality. Electronically Signed   By: Deatra Robinson M.D.   On: 12/18/2020 22:16        Scheduled Meds:  amLODipine  7.5 mg Oral Daily   bisacodyl  10 mg Rectal Daily   brimonidine  1 drop Both Eyes BID   Chlorhexidine Gluconate Cloth  6 each Topical Daily   dorzolamide  1 drop Both Eyes BID   enoxaparin (LOVENOX) injection  30 mg Subcutaneous Q24H   feeding supplement  237 mL Oral BID BM   lip balm  1 application Topical BID   Continuous Infusions:  sodium chloride 250 mL (12/17/20 1741)   cefTRIAXone (ROCEPHIN)  IV 2 g (12/18/20 0915)   dextrose 5 % and 0.45% NaCl 75 mL/hr at 12/19/20 0352   lactated ringers 50 mL/hr at 12/18/20 2319   lactated ringers 100 mL/hr at 12/18/2020 0803   methocarbamol (ROBAXIN) IV 1,000 mg (12/18/20 1735)   metronidazole 500 mg (12/19/20 0035)   ondansetron (ZOFRAN) IV       LOS: 4 days    Time spent: 25 mins.More than 50% of that time was spent in counseling and/or coordination of care.      Burnadette Pop, MD [facility] P6/30/2022, 8:07 AM

## 2020-12-20 ENCOUNTER — Inpatient Hospital Stay (HOSPITAL_COMMUNITY): Payer: Medicare Other

## 2020-12-20 LAB — CBC
HCT: 30.2 % — ABNORMAL LOW (ref 36.0–46.0)
Hemoglobin: 9 g/dL — ABNORMAL LOW (ref 12.0–15.0)
MCH: 29.3 pg (ref 26.0–34.0)
MCHC: 29.8 g/dL — ABNORMAL LOW (ref 30.0–36.0)
MCV: 98.4 fL (ref 80.0–100.0)
Platelets: 165 10*3/uL (ref 150–400)
RBC: 3.07 MIL/uL — ABNORMAL LOW (ref 3.87–5.11)
RDW: 14 % (ref 11.5–15.5)
WBC: 8.1 10*3/uL (ref 4.0–10.5)
nRBC: 0 % (ref 0.0–0.2)

## 2020-12-20 LAB — BASIC METABOLIC PANEL
Anion gap: 6 (ref 5–15)
BUN: 32 mg/dL — ABNORMAL HIGH (ref 8–23)
CO2: 31 mmol/L (ref 22–32)
Calcium: 8.3 mg/dL — ABNORMAL LOW (ref 8.9–10.3)
Chloride: 102 mmol/L (ref 98–111)
Creatinine, Ser: 0.92 mg/dL (ref 0.44–1.00)
GFR, Estimated: >60 mL/min (ref >60)
Glucose, Bld: 137 mg/dL — ABNORMAL HIGH (ref 70–99)
Potassium: 3.6 mmol/L (ref 3.5–5.1)
Sodium: 139 mmol/L (ref 135–145)

## 2020-12-20 MED ORDER — FUROSEMIDE 10 MG/ML IJ SOLN
20.0000 mg | Freq: Two times a day (BID) | INTRAMUSCULAR | Status: DC
  Administered 2020-12-20 – 2020-12-25 (×10): 20 mg via INTRAVENOUS
  Filled 2020-12-20 (×11): qty 2

## 2020-12-20 MED ORDER — LIDOCAINE HCL 1 % IJ SOLN
INTRAMUSCULAR | Status: AC
  Filled 2020-12-20: qty 20

## 2020-12-20 MED ORDER — IOHEXOL 300 MG/ML  SOLN
100.0000 mL | Freq: Once | INTRAMUSCULAR | Status: AC | PRN
  Administered 2020-12-20: 75 mL via INTRAVENOUS

## 2020-12-20 MED ORDER — SODIUM CHLORIDE (PF) 0.9 % IJ SOLN
INTRAMUSCULAR | Status: AC
  Filled 2020-12-20: qty 50

## 2020-12-20 NOTE — Progress Notes (Signed)
PROGRESS NOTE    DAVIS VANNATTER  HYI:[id number] DOB: 12-15-1933 DOA: 2021/01/11 PCP: Marcine Matar, MD   Chief Complain: Abdominal pain, nausea and vomiting  Brief Narrative: Patient is 87 year old female with history of small bowel obstruction requiring lysis of adhesions in 2013, malnutrition, hypertension, CKD stage III who presented to the emergency department with complaints of severe abdominal pain, distention, nausea/vomiting.  CT abdomen showed high-grade small bowel obstruction with transition point at the right lower quadrant.  Patient was admitted for SBO, NG tube inserted.  General surgery consulted and following.  Underwent expiratory laparotomy on 6/26.  Hospital course remarkable for lethargic after receiving dose of Dilaudid, improvement with Narcan.  She was also hypotensive on 12/19/2020 and was moved to progressive unit.   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  CT abdomen showed high-grade small bowel obstruction with transition point at the right lower quadrant.  Patient was admitted for SBO, NG tube inserted.  General surgery consulted and following.  Underwent expiratory laparotomy on 6/26,s/p proximal colectomy with ileal resection, Continue antiemetics, pain management, IV fluids She was also on IV antibiotics, completed the  course of 5 days postop. Currently on clear liquid diet.  Abdominal x-ray pending for today.  Acute hypoxic respiratory failure/COPD: She does not use oxygen at baseline.She H/O COPD,not wheezing  at present.  Most likely she was volume overloaded.  Chest x-ray showed bilateral features of pulmonary edema.  Little room to give aggressive IV Lasix therapy.  Currently on 20 mg IV twice daily.  She got tons of fluid during this hospitalization.  We will check CT chest with contrast.  Low suspicion for pneumonia.  She could also have restrictive lung disease secondary to her scoliosis,body habitus  Hypertension: Became hypotensive on 6/30.  Antihypertensives on hold.  Monitor blood pressure.  Had to be started on midodrine.  Severe malnutrition: Nutrition team will be consulted when  solid diet will be started  CKD stage IIIb: Currently kidney function at baseline.  Avoid nephrotoxins  Normocytic anemia: Hemoglobin dropped to the range of 8, continue monitoring.  No evidence of active bleeding.  Iron studies show severe iron deficiency.  She was given a dose of IV iron infusion.  Oral supplementation will also be started on discharge..  Thrombocytopenia: Likely chronic.  Continue to monitor  Acute encephalopathy/lethargy: Patient is very sensitive to opioids.  Became lethargic on 12/17/2020 after receiving a dose of Dilaudid.  Symptoms improved with Narcan.  Avoid opiates as much as possible.  CT head did not show any acute intracranial abnormalities, showed chronic small vessel disease.  This morning she was alert and oriented.  Goals of care/debility/deconditioning: DNR.  PT/OT evaluation done, recommended skilled nursing facility  on discharge.TOC following           DVT prophylaxis: SCD Code Status: DNR Family Communication:: Discussed with daughter at bedside  on 12/19/2020   status is: Inpatient  Remains inpatient appropriate because:Inpatient level of care appropriate due to severity  of illness  Dispo: The patient is from: Home              Anticipated d/c is to: SNF              Patient currently is not medically stable to d/c.   Difficult to place patient No     Consultants: GI  Procedures: Exploratory laparotomy  Antimicrobials:  Anti-infectives (From admission, onward)    Start     Dose/Rate Route Frequency Ordered Stop   12/17/20 0015  cefoTEtan (CEFOTAN) 2 g in sodium chloride 0.9 % 100 mL IVPB        2 g 200 mL/hr over 30 Minutes Intravenous Every 12 hours 12/16/20 2316 12/17/20 0254   12/16/20 1100  cefTRIAXone (ROCEPHIN) 2 g in sodium chloride 0.9 % 100 mL IVPB        2 g 200 mL/hr over 30 Minutes Intravenous Daily 12/16/20 1010 12/20/20 0853   11/28/2020 1000  cefTRIAXone (ROCEPHIN) 2 g in sodium chloride 0.9 % 100 mL IVPB       See Hyperspace for full Linked Orders Report.   2 g 200 mL/hr over 30 Minutes Intravenous On call to O.R. 11/27/2020 0905 11/24/2020 1045   12/08/2020 1000  metroNIDAZOLE (FLAGYL) IVPB 500 mg       See Hyperspace for full Linked Orders Report.   500 mg 100 mL/hr over 60 Minutes Intravenous Every 8 hours 12/14/2020 0905 12/20/20 2359       Subjective:  Patient seen and examined the bedside this morning.  EKG monitor showed sinus rhythm.  Her blood pressure has been better today.  She was on high flow nasal cannula at 5 L/min.  She complains of shortness of breath but was not coughing or in any respiratory distress during my evaluation.   Objective: Vitals:   12/20/20 0154 12/20/20 0500 12/20/20 0621 12/20/20 1309  BP: 127/75  109/65 132/79  Pulse: 84  83 92  Resp: Temp: 97.6 F (36.4 C)  98 F (36.7 C) (!) 97.5 F (36.4 C)  TempSrc: Oral  Oral Oral  SpO2: 91%  93% 91%  Weight:  43.1 kg    Height:        Intake/Output Summary (Last 24 hours) at 12/20/2020 1314 Last data filed at 12/20/2020 1000 Gross per 24 hour  Intake 1195.18 ml  Output 300 ml  Net 895.18 ml   Filed Weights   12/05/2020 0007 12/06/2020  0620 12/20/20 0500  Weight: 37 kg 35.6 kg 43.1 kg    Examination:  General exam: Overall comfortable, very deconditioned, debilitated HEENT: PERRL Respiratory system: Diminished air sound bilaterally, no wheezes or crackles  Cardiovascular system: S1 & S2 heard, RRR.  Gastrointestinal system: Abdomen is nondistended, soft and nontender.surgical wound Central nervous system: Alert and oriented Extremities: No edema, no clubbing ,no cyanosis Skin: No rashes, no ulcers,no icterus     Data Reviewed: I have personally reviewed following labs and imaging studies  CBC: Recent Labs  Lab 12/11/2020 0004 12/16/20 0547 12/17/20 0401 12/18/20 0417 12/19/20 0417 12/20/20 0428  WBC 8.8 10.2 11.3* 7.8 7.8 8.1  NEUTROABS 7.8*  --   --   --  6.9  --   HGB 15.1* 10.9* 10.8* 8.8* 9.7* 9.0*  HCT 48.9* 36.3 37.3 30.3* 31.7* 30.2*  MCV 93.1 96.5 100.3* 100.0 95.5 98.4  PLT  185 139* 136* 140* 151 165   Basic Metabolic Panel: Recent Labs  Lab 12/16/20 0422 12/17/20 0401 12/18/20 0417 12/19/20 0417 12/20/20 0428  NA 137 139 139 139 139  K 4.4 5.1 4.3 3.6 3.6  CL 99 100 101 103 102  CO2 33* 30 29 29 31   GLUCOSE 98 74 119* 112* 137*  BUN 22 34* 44* 39* 32*  CREATININE 1.13* 0.95 1.52* 1.10* 0.92  CALCIUM 8.9 8.7* 8.4* 8.4* 8.3*  MG 2.0  --  2.5*  --   --   PHOS 4.5  --  3.5  --   --    GFR: Estimated Creatinine Clearance: 30.4 mL/min (by C-G formula based on SCr of 0.92 mg/dL). Liver Function Tests: Recent Labs  Lab Jan 11, 2021 0004  AST 25  ALT 13  ALKPHOS 147*  BILITOT 0.7  PROT 8.1  ALBUMIN 4.3   Recent Labs  Lab 01-11-2021 0004  LIPASE 23   No results for input(s): AMMONIA in the last 168 hours. Coagulation Profile: No results for input(s): INR, PROTIME in the last 168 hours. Cardiac Enzymes: No results for input(s): CKTOTAL, CKMB, CKMBINDEX, TROPONINI in the last 168 hours. BNP (last 3 results) No results for input(s): PROBNP in the last 8760 hours. HbA1C: No results  for input(s): HGBA1C in the last 72 hours. CBG: Recent Labs  Lab 12/17/20 0837 12/17/20 0924  GLUCAP 66* 101*   Lipid Profile: No results for input(s): CHOL, HDL, LDLCALC, TRIG, CHOLHDL, LDLDIRECT in the last 72 hours. Thyroid Function Tests: No results for input(s): TSH, T4TOTAL, FREET4, T3FREE, THYROIDAB in the last 72 hours. Anemia Panel: Recent Labs    12/18/20 0909  FERRITIN 61  TIBC 257  IRON 13*   Sepsis Labs: Recent Labs  Lab 01-11-2021 0004 January 11, 2021 0528 01-11-2021 0743 2021-01-11 1246  LATICACIDVEN 3.1* 4.9* 3.7* 5.3*    Recent Results (from the past 240 hour(s))  Resp Panel by RT-PCR (Flu A&B, Covid) Nasopharyngeal Swab     Status: None   Collection Time: 2021/01/11  5:28 AM   Specimen: Nasopharyngeal Swab; Nasopharyngeal(NP) swabs in vial transport medium  Result Value Ref Range Status   SARS Coronavirus 2 by RT PCR NEGATIVE NEGATIVE Final    Comment: (NOTE) SARS-CoV-2 target nucleic acids are NOT DETECTED.  The SARS-CoV-2 RNA is generally detectable in upper respiratory specimens during the acute phase of infection. The lowest concentration of SARS-CoV-2 viral copies this assay can detect is 138 copies/mL. A negative result does not preclude SARS-Cov-2 infection and should not be used as the sole basis for treatment or other patient management decisions. A negative result may occur with  improper specimen collection/handling, submission of specimen other than nasopharyngeal swab, presence of viral mutation(s) within the areas targeted by this assay, and inadequate number of viral copies(<138 copies/mL). A negative result must be combined with clinical observations, patient history, and epidemiological information. The expected result is Negative.  Fact Sheet for Patients:  12/17/20  Fact Sheet for Healthcare Providers:  BloggerCourse.com  This test is no t yet approved or cleared by the [employer] FDA and  has been authorized for detection and/or diagnosis of SARS-CoV-2 by FDA under an Emergency Use Authorization (EUA). This EUA will remain  in effect (meaning this test can be used) for the duration of the COVID-19 declaration under Section 564(b)(1) of the Act, 21 U.S.C.section 360bbb-3(b)(1), unless the authorization is terminated  or revoked sooner.       Influenza A by PCR NEGATIVE NEGATIVE  Final   Influenza B by PCR NEGATIVE NEGATIVE Final    Comment: (NOTE) The Xpert Xpress SARS-CoV-2/FLU/RSV plus assay is intended as an aid in the diagnosis of influenza from Nasopharyngeal swab specimens and should not be used as a sole basis for treatment. Nasal washings and aspirates are unacceptable for Xpert Xpress SARS-CoV-2/FLU/RSV testing.  Fact Sheet for Patients: BloggerCourse.com  Fact Sheet for Healthcare Providers: SeriousBroker.it  This test is not yet approved or cleared by the [place] FDA and has been authorized for detection and/or diagnosis of SARS-CoV-2 by FDA under an Emergency Use Authorization (EUA). This EUA will remain in effect (meaning this test can be used) for the duration of the COVID-19 declaration under Section 564(b)(1) of the Act, 21 U.S.C. section 360bbb-3(b)(1), unless the authorization is terminated or revoked.  Performed at [facility], [address] [place], [place] [address]   Surgical pcr screen     Status: None   Collection Time: 11/25/2020  8:54 AM   Specimen: Nasal Mucosa; Nasal Swab  Result Value Ref Range Status   MRSA, PCR NEGATIVE NEGATIVE Final   Staphylococcus aureus NEGATIVE NEGATIVE Final    Comment: (NOTE) The Xpert SA Assay (FDA approved for NASAL specimens in patients 61 years of age and older), is one component of a comprehensive surveillance program. It is not intended to diagnose infection nor to guide or monitor treatment. Performed at  [facility], [address] [place], [place] [address]          Radiology Studies: CT HEAD WO CONTRAST  Result Date: 12/18/2020 CLINICAL DATA:  Delirium EXAM: CT HEAD WITHOUT CONTRAST TECHNIQUE: Contiguous axial images were obtained from the base of the skull through the vertex without intravenous contrast. COMPARISON:  None. FINDINGS: Brain: There is no mass, hemorrhage or extra-axial collection. The size and configuration of the ventricles and extra-axial CSF spaces are normal. There is hypoattenuation of the white matter, most commonly indicating chronic small vessel disease. Vascular: No abnormal hyperdensity of the major intracranial arteries or dural venous sinuses. No intracranial atherosclerosis. Skull: The visualized skull base, calvarium and extracranial soft tissues are normal. Sinuses/Orbits: No fluid levels or advanced mucosal thickening of the visualized paranasal sinuses. No mastoid or middle ear effusion. The orbits are normal. IMPRESSION: Chronic small vessel disease without acute intracranial abnormality. Electronically Signed   By: Deatra Robinson M.D.   On: 12/18/2020 22:16   DG CHEST PORT 1 VIEW  Result Date: 12/19/2020 CLINICAL DATA:  Shortness of breath. EXAM: PORTABLE CHEST 1 VIEW COMPARISON:  March 17, 2020. FINDINGS: Stable cardiomegaly. No pneumothorax is noted. Increased bilateral perihilar and basilar opacities are noted concerning for worsening edema or atelectasis with associated pleural effusions. Bony thorax is unremarkable. IMPRESSION: Increased bilateral perihilar and basilar opacities are noted concerning for worsening edema or atelectasis with associated pleural effusions. Electronically Signed   By: Lupita Raider M.D.   On: 12/19/2020 13:46        Scheduled Meds:  bisacodyl  10 mg Rectal Daily   brimonidine  1 drop Both Eyes BID   dorzolamide  1 drop Both Eyes BID   enoxaparin (LOVENOX) injection  30 mg Subcutaneous Q24H    feeding supplement  237 mL Oral BID BM   furosemide  20 mg Intravenous Q12H   lip balm  1 application Topical BID   midodrine  5 mg Oral TID WC   sodium chloride (PF)       Continuous Infusions:  sodium chloride 250 mL (12/20/20 0822)   methocarbamol (ROBAXIN) IV 1,000 mg (12/18/20 1735)   metronidazole 500 mg (12/20/20 0904)   ondansetron (ZOFRAN) IV       LOS: 5 days    Time spent: 25 mins.More than 50% of that time was spent in counseling and/or coordination of care.      Burnadette PopAmrit Kailer Heindel, MD [facility] P7/06/2020, 1:14 PM

## 2020-12-20 NOTE — Plan of Care (Signed)
  Problem: Clinical Measurements: Goal: Respiratory complications will improve Outcome: Progressing   Problem: Coping: Goal: Level of anxiety will decrease Outcome: Progressing   Problem: Pain Managment: Goal: General experience of comfort will improve Outcome: Progressing   Problem: Skin Integrity: Goal: Risk for impaired skin integrity will decrease Outcome: Progressing   

## 2020-12-20 NOTE — Progress Notes (Signed)
Got patient up to bedside commode, as we were getting her back to bed, she stated her left shoulder hurt and her chest felt tight, said she felt like she was having a heart attack. Did EKG, notified MD.

## 2020-12-20 NOTE — Procedures (Addendum)
PROCEDURE SUMMARY:  Successful US guided right thoracentesis. Yielded 400 mL of clear yellow fluid. Pt tolerated procedure well. No immediate complications. Pt O2 sats improved from 95% to 99% at end of procedure.  Specimen was not sent for labs. CXR suggest small apical PTX, likely ex vacuo. Recommend CXR in am or if symptomatic  EBL < 5 mL  Brayton El PA-C 12/20/2020 5:06 PM

## 2020-12-20 NOTE — Progress Notes (Addendum)
5 Days Post-Op  Subjective: CC: Overnight events reviewed. Hypotensive overnight. EKG w/ A. Fib. BNP in 900's. IVF stopped. Moved to progressive floor. BP improved this am.  A&O x 4. Drinking clears but feels distended/bloated with occasional nausea. No emesis. Passing flatus. Liquid BM yesterday.   Objective: Vital signs in last 24 hours: Temp:  [97.4 F (36.3 C)-98.5 F (36.9 C)] 98 F (36.7 C) (07/01 0621) Pulse Rate:  [81-94] 83 (07/01 0621) Resp:  [14-20] 20 (07/01 0621) BP: (84-127)/(51-75) 109/65 (07/01 0621) SpO2:  [78 %-95 %] 93 % (07/01 0621) Weight:  [43.1 kg] 43.1 kg (07/01 0500) Last BM Date: 12/19/20  Intake/Output from previous day: 06/30 0701 - 07/01 0700 In: 1300.1 [P.O.:340; I.V.:761.7; IV Piggyback:198.3] Out: 300 [Urine:300] Intake/Output this shift: No intake/output data recorded.  PE: Gen:  Alert, NAD, pleasant Card:  Reg Pulm: Normal rate and effort Abd: Soft, moderate distension, appropriately tender around incision without peritonitis. Midline wound c/d/I. + BS.  Neuro: A&Ox4  Skin: no rashes noted, warm and dry  Lab Results:  Recent Labs    12/19/20 0417 12/20/20 0428  WBC 7.8 8.1  HGB 9.7* 9.0*  HCT 31.7* 30.2*  PLT 151 165   BMET Recent Labs    12/19/20 0417 12/20/20 0428  NA 139 139  K 3.6 3.6  CL 103 102  CO2 29 31  GLUCOSE 112* 137*  BUN 39* 32*  CREATININE 1.10* 0.92  CALCIUM 8.4* 8.3*   PT/INR No results for input(s): LABPROT, INR in the last 72 hours. CMP     Component Value Date/Time   NA 139 12/20/2020 0428   K 3.6 12/20/2020 0428   CL 102 12/20/2020 0428   CO2 31 12/20/2020 0428   GLUCOSE 137 (H) 12/20/2020 0428   BUN 32 (H) 12/20/2020 0428   CREATININE 0.92 12/20/2020 0428   CALCIUM 8.3 (L) 12/20/2020 0428   PROT 8.1 01/07/2021 0004   ALBUMIN 4.3 2021-01-07 0004   AST 25 01/07/2021 0004   ALT 13 Jan 07, 2021 0004   ALKPHOS 147 (H) 07-Jan-2021 0004   BILITOT 0.7 07-Jan-2021 0004   GFRNONAA >60  12/20/2020 0428   GFRAA >60 03/17/2020 1624   Lipase     Component Value Date/Time   LIPASE 23 2021/01/07 0004       Studies/Results: CT HEAD WO CONTRAST  Result Date: 12/18/2020 CLINICAL DATA:  Delirium EXAM: CT HEAD WITHOUT CONTRAST TECHNIQUE: Contiguous axial images were obtained from the base of the skull through the vertex without intravenous contrast. COMPARISON:  None. FINDINGS: Brain: There is no mass, hemorrhage or extra-axial collection. The size and configuration of the ventricles and extra-axial CSF spaces are normal. There is hypoattenuation of the white matter, most commonly indicating chronic small vessel disease. Vascular: No abnormal hyperdensity of the major intracranial arteries or dural venous sinuses. No intracranial atherosclerosis. Skull: The visualized skull base, calvarium and extracranial soft tissues are normal. Sinuses/Orbits: No fluid levels or advanced mucosal thickening of the visualized paranasal sinuses. No mastoid or middle ear effusion. The orbits are normal. IMPRESSION: Chronic small vessel disease without acute intracranial abnormality. Electronically Signed   By: Deatra Robinson M.D.   On: 12/18/2020 22:16   DG CHEST PORT 1 VIEW  Result Date: 12/19/2020 CLINICAL DATA:  Shortness of breath. EXAM: PORTABLE CHEST 1 VIEW COMPARISON:  March 17, 2020. FINDINGS: Stable cardiomegaly. No pneumothorax is noted. Increased bilateral perihilar and basilar opacities are noted concerning for worsening edema or atelectasis with associated pleural effusions. Bony  thorax is unremarkable. IMPRESSION: Increased bilateral perihilar and basilar opacities are noted concerning for worsening edema or atelectasis with associated pleural effusions. Electronically Signed   By: Lupita Raider M.D.   On: 12/19/2020 13:46    Anti-infectives: Anti-infectives (From admission, onward)    Start     Dose/Rate Route Frequency Ordered Stop   12/17/20 0015  cefoTEtan (CEFOTAN) 2 g in  sodium chloride 0.9 % 100 mL IVPB        2 g 200 mL/hr over 30 Minutes Intravenous Every 12 hours 12/16/20 2316 12/17/20 0254   12/16/20 1100  cefTRIAXone (ROCEPHIN) 2 g in sodium chloride 0.9 % 100 mL IVPB        2 g 200 mL/hr over 30 Minutes Intravenous Daily 12/16/20 1010     11/30/2020 1000  cefTRIAXone (ROCEPHIN) 2 g in sodium chloride 0.9 % 100 mL IVPB       See Hyperspace for full Linked Orders Report.   2 g 200 mL/hr over 30 Minutes Intravenous On call to O.R. 12/14/2020 0905 12/09/2020 1045   11/30/2020 1000  metroNIDAZOLE (FLAGYL) IVPB 500 mg       See Hyperspace for full Linked Orders Report.   500 mg 100 mL/hr over 60 Minutes Intravenous Every 8 hours 12/09/2020 0905          Assessment/Plan POD 5 - s/p proximal colectomy with ileal resection, LOA for ileocolonic volvulus with closed-loop obstruction and necrosis - Dr. Michaell Cowing - 6/26 - Keep on CLD for now. Had nausea overnight and mildly distended this am but is also passing flatus and having bm's. Will get abd xray now - Completes 5 days post op abx today - Mobilize. PT rec SNF - Pulm toilet  Addendum: Xray reviewed. Significant gastric distension. Make NPO. NGT. Xray to confirm placement. May need to consider TPN over the weekend.    FEN - NPO VTE - SCDs, Lovenox ID - Rocephin/Flagyl 6/26 - 7/1 Foley - out Follow-up - Dr. Michaell Cowing   Confusion - CTH negative. Improved. Avoid narcotics. Family has decided to make her DNR/DNI per TRH note. ABL anemia - hgb 15.1 > 10.9 > 10.8 > 8.8 > 8.7>8.1, monitor. Hypoxia - elevated bnp. Per trh  HTN AKI w/ CKD3 - Cr 0.95 > 1.52 > 1.1 > 0.92, improved   LOS: 5 days    Jacinto Halim , [facility] 12/20/2020, 10:04 AM Please see Amion for pager number during day hours 7:00am-4:30pm

## 2020-12-20 NOTE — Progress Notes (Signed)
At 2100~ pt's O2 saturation dropped to 70%. Pt stated "im a bit short of breath". Pt placed on 4L O2 HFNC and has been sating around the  90's. Will continue to monitor

## 2020-12-20 DEATH — deceased

## 2020-12-21 ENCOUNTER — Inpatient Hospital Stay: Payer: Self-pay

## 2020-12-21 DIAGNOSIS — L8990 Pressure ulcer of unspecified site, unspecified stage: Secondary | ICD-10-CM | POA: Insufficient documentation

## 2020-12-21 LAB — BASIC METABOLIC PANEL
Anion gap: 8 (ref 5–15)
BUN: 30 mg/dL — ABNORMAL HIGH (ref 8–23)
CO2: 33 mmol/L — ABNORMAL HIGH (ref 22–32)
Calcium: 8.3 mg/dL — ABNORMAL LOW (ref 8.9–10.3)
Chloride: 98 mmol/L (ref 98–111)
Creatinine, Ser: 0.92 mg/dL (ref 0.44–1.00)
GFR, Estimated: >60 mL/min (ref >60)
Glucose, Bld: 119 mg/dL — ABNORMAL HIGH (ref 70–99)
Potassium: 3.2 mmol/L — ABNORMAL LOW (ref 3.5–5.1)
Sodium: 139 mmol/L (ref 135–145)

## 2020-12-21 LAB — PHOSPHORUS: Phosphorus: 2.6 mg/dL (ref 2.5–4.6)

## 2020-12-21 LAB — GLUCOSE, CAPILLARY
Glucose-Capillary: 85 mg/dL (ref 70–99)
Glucose-Capillary: 108 mg/dL — ABNORMAL HIGH (ref 70–99)
Glucose-Capillary: 135 mg/dL — ABNORMAL HIGH (ref 70–99)

## 2020-12-21 LAB — PREALBUMIN: Prealbumin: 9.9 mg/dL — ABNORMAL LOW (ref 18–38)

## 2020-12-21 LAB — MAGNESIUM: Magnesium: 1.9 mg/dL (ref 1.7–2.4)

## 2020-12-21 MED ORDER — INSULIN ASPART 100 UNIT/ML IJ SOLN
0.0000 [IU] | Freq: Four times a day (QID) | INTRAMUSCULAR | Status: DC
  Administered 2020-12-22: 1 [IU] via SUBCUTANEOUS
  Administered 2020-12-22 (×2): 2 [IU] via SUBCUTANEOUS
  Administered 2020-12-22: 1 [IU] via SUBCUTANEOUS
  Administered 2020-12-23 (×3): 2 [IU] via SUBCUTANEOUS
  Administered 2020-12-23: 1 [IU] via SUBCUTANEOUS
  Administered 2020-12-24 (×3): 2 [IU] via SUBCUTANEOUS
  Administered 2020-12-24: 1 [IU] via SUBCUTANEOUS
  Administered 2020-12-25: 7 [IU] via SUBCUTANEOUS
  Administered 2020-12-25: 5 [IU] via SUBCUTANEOUS
  Administered 2020-12-25 – 2020-12-26 (×2): 1 [IU] via SUBCUTANEOUS
  Administered 2020-12-26: 2 [IU] via SUBCUTANEOUS

## 2020-12-21 MED ORDER — POTASSIUM CHLORIDE 20 MEQ PO PACK: 40.0000 meq | PACK | Freq: Once | ORAL | Status: DC

## 2020-12-21 MED ORDER — POTASSIUM CHLORIDE 10 MEQ/100ML IV SOLN
INTRAVENOUS | Status: AC
  Administered 2020-12-21: 10 meq
  Filled 2020-12-21: qty 100

## 2020-12-21 MED ORDER — TRAVASOL 10 % IV SOLN
INTRAVENOUS | Status: AC
  Filled 2020-12-21: qty 345.6

## 2020-12-21 MED ORDER — BOOST / RESOURCE BREEZE PO LIQD CUSTOM
1.0000 | Freq: Three times a day (TID) | ORAL | Status: DC
  Administered 2020-12-21 (×2): 1 via ORAL

## 2020-12-21 MED ORDER — SODIUM CHLORIDE 0.9% FLUSH: 10.0000 mL | INTRAVENOUS | Status: DC | PRN

## 2020-12-21 MED ORDER — KETOROLAC TROMETHAMINE 30 MG/ML IJ SOLN
15.0000 mg | Freq: Three times a day (TID) | INTRAMUSCULAR | Status: AC | PRN
  Administered 2020-12-22: 15 mg via INTRAVENOUS
  Filled 2020-12-21: qty 1

## 2020-12-21 MED ORDER — POTASSIUM CHLORIDE 10 MEQ/100ML IV SOLN
10.0000 meq | INTRAVENOUS | Status: AC
  Administered 2020-12-21 (×3): 10 meq via INTRAVENOUS
  Filled 2020-12-21 (×3): qty 100

## 2020-12-21 MED ORDER — CHLORHEXIDINE GLUCONATE CLOTH 2 % EX PADS
6.0000 | MEDICATED_PAD | Freq: Every day | CUTANEOUS | Status: DC
  Administered 2020-12-21 – 2020-12-25 (×5): 6 via TOPICAL

## 2020-12-21 NOTE — Progress Notes (Signed)
NG placed on 12/20/20 @ 2030 400 cc of gastric residual noted. The Pt pull out ng, shortly after attempted to replace the pt's NG , she started to become sob 02 in the low 80's. Called NP [place] made her aware NG placement  on hold for now. I will continue to monitor.

## 2020-12-21 NOTE — Progress Notes (Addendum)
6 Days Post-Op  Subjective: CC: NG tube was briefly placed last night but patient removed, about 400 out.  She reports that he is hungry.  Continues to have bowel movements and flatus.  Denies abdominal pain or nausea  Objective: Vital signs in last 24 hours: Temp:  [97.5 F (36.4 C)-98.1 F (36.7 C)] 97.5 F (36.4 C) (07/02 0438) Pulse Rate:  [90-92] 92 (07/02 0438) Resp:  [14-20] 20 (07/02 0438) BP: (106-132)/(53-84) 106/53 (07/02 0438) SpO2:  [91 %-99 %] 91 % (07/02 0438) Weight:  [47 kg] 47 kg (07/02 0436) Last BM Date: 12/20/20  Intake/Output from previous day: 07/01 0701 - 07/02 0700 In: 917 [P.O.:480; I.V.:38.7; IV Piggyback:398.3] Out: 500 [Urine:500] Intake/Output this shift: Total I/O In: -  Out: 400 [Emesis/NG output:400]  PE: Gen:  Alert, NAD, pleasant Card:  Reg Pulm: Normal rate and effort Abd: Soft, mild distension, appropriately tender around incision without peritonitis. Midline wound c/d/I. + BS.  Neuro: A&Ox4  Skin: no rashes noted, warm and dry  Lab Results:  Recent Labs    12/19/20 0417 12/20/20 0428  WBC 7.8 8.1  HGB 9.7* 9.0*  HCT 31.7* 30.2*  PLT 151 165    BMET Recent Labs    12/20/20 0428 12/21/20 0516  NA 139 139  K 3.6 3.2*  CL 102 98  CO2 31 33*  GLUCOSE 137* 119*  BUN 32* 30*  CREATININE 0.92 0.92  CALCIUM 8.3* 8.3*    PT/INR No results for input(s): LABPROT, INR in the last 72 hours. CMP     Component Value Date/Time   NA 139 12/21/2020 0516   K 3.2 (L) 12/21/2020 0516   CL 98 12/21/2020 0516   CO2 33 (H) 12/21/2020 0516   GLUCOSE 119 (H) 12/21/2020 0516   BUN 30 (H) 12/21/2020 0516   CREATININE 0.92 12/21/2020 0516   CALCIUM 8.3 (L) 12/21/2020 0516   PROT 8.1 12/08/2020 0004   ALBUMIN 4.3 12/03/2020 0004   AST 25 12/04/2020 0004   ALT 13 12/04/2020 0004   ALKPHOS 147 (H) 12/09/2020 0004   BILITOT 0.7 11/21/2020 0004   GFRNONAA >60 12/21/2020 0516   GFRAA >60 03/17/2020 1624   Lipase      Component Value Date/Time   LIPASE 23 11/28/2020 0004       Studies/Results: DG Chest 1 View  Result Date: 12/20/2020 CLINICAL DATA:  Post RIGHT thoracentesis with removal of 400 mL fluid EXAM: CHEST  1 VIEW COMPARISON:  CT 12/20/2020, radiograph 12/19/2020 FINDINGS: Interval decrease in the amount of gradient opacity in the right lung base with small residual layering effusion tracking into the fissure. Small apical pneumothorax. Overall appearance of a left pleural effusion is grossly similar to comparison radiography. Some additional areas of scarring and architectural distortion noted towards the bilateral lung apices, right greater than left. Portions of the cardiac silhouette are obscured by overlying opacity. Visible portions are unchanged prior. Chest wall deformity compatible with pectus excavatum seen on comparison. No acute abnormality of the chest wall. Telemetry leads overlie the chest. IMPRESSION: Post right thoracentesis with decrease in the amount of failing gradient opacity in the right lung base compatible with a diminished right pleural effusion. Small layering effusion remains with adjacent passive atelectasis. Small right apical pneumothorax. Stable appearance of a left pleural effusion.  No left pneumothorax. No other significant interval changes. These results will be called to the ordering clinician or representative by the Radiologist Assistant, and communication documented in the PACS or [facility]. Electronically Signed   By: Kreg Shropshire M.D.   On: 12/20/2020 17:22   CT CHEST W CONTRAST  Result Date: 12/20/2020 CLINICAL DATA:  Respiratory failure EXAM: CT CHEST WITH CONTRAST TECHNIQUE: Multidetector CT imaging of the chest was performed during intravenous contrast administration. CONTRAST:  72mL OMNIPAQUE IOHEXOL 300 MG/ML  SOLN COMPARISON:  Chest radiograph 12/19/2020 and CT a chest 12/29/2014 FINDINGS: Cardiovascular: Atherosclerotic calcification of the thoracic  aorta and branch vessels. Mild cardiomegaly. Mediastinum/Nodes: No overtly pathologic adenopathy is identified. Dilated upper thoracic esophagus with air-fluid level, the lower thoracic esophagus is somewhat displaced to the left possibly as result of the pectus excavatum. Lungs/Pleura: Large bilateral pleural effusions (nonspecific for transudative versus exudative etiology with passive atelectasis including complete atelectasis of both lower lobes. Biapical pleuroparenchymal scarring. Upper Abdomen: Ascites. Musculoskeletal: Pectus excavatum. Severe thoracic kyphosis. Substantial levoconvex upper thoracic and dextroconvex lower thoracic scoliosis. Marked paucity of adipose tissue. IMPRESSION: 1. Large bilateral pleural effusions with associated passive atelectasis. 2. Ascites. 3. Thoracic kyphosis with substantial thoracic scoliosis. 4. Pectus excavatum. 5.  Aortic Atherosclerosis (ICD10-I70.0).  Mild cardiomegaly. 6. Dilated upper thoracic esophagus with air-fluid level, and leftward deviation of the lower thoracic esophagus possibly partially related to the pectus excavatum. Electronically Signed   By: Gaylyn Rong M.D.   On: 12/20/2020 15:27   DG CHEST PORT 1 VIEW  Result Date: 12/19/2020 CLINICAL DATA:  Shortness of breath. EXAM: PORTABLE CHEST 1 VIEW COMPARISON:  March 17, 2020. FINDINGS: Stable cardiomegaly. No pneumothorax is noted. Increased bilateral perihilar and basilar opacities are noted concerning for worsening edema or atelectasis with associated pleural effusions. Bony thorax is unremarkable. IMPRESSION: Increased bilateral perihilar and basilar opacities are noted concerning for worsening edema or atelectasis with associated pleural effusions. Electronically Signed   By: Lupita Raider M.D.   On: 12/19/2020 13:46   DG Abd Portable 1V  Result Date: 12/20/2020 CLINICAL DATA:  NG tube placement EXAM: PORTABLE ABDOMEN - 1 VIEW COMPARISON:  12/20/2020, CT 12/08/2020 FINDINGS:  Esophageal tube tip has been placed, somewhat low lying tip at the right lower quadrant/pelvis. Residual air distension of central small bowel. Contrast in the bladder. IMPRESSION: 1. Esophageal tube tip somewhat low lying in appearance and projects at the level of right pelvis. Tip could potentially be within second portion of duodenum 2. Air distension of central small bowel consistent with history of bowel obstruction Electronically Signed   By: Jasmine Pang M.D.   On: 12/20/2020 21:42   DG Abd Portable 1V  Result Date: 12/20/2020 CLINICAL DATA:  Abdominal pain and distension with bloating EXAM: PORTABLE ABDOMEN - 1 VIEW COMPARISON:  Multiple exams, including 11/26/2020 FINDINGS: The nasogastric tube is no longer visualized. There is substantial gaseous distention of the stomach. This displaces loops of bowel in the abdomen. Stable lines noted in the right lower quadrant. Aortoiliac atherosclerotic vascular disease. Bony demineralization. Levoconvex lumbar scoliosis with rotary component. There is also leftward rotation of the patient on today's exam. Left upper quadrant calcification corresponding to known left renal calculus. IMPRESSION: 1. Gaseous distention of the stomach. 2. Stable appearance of left renal calculus. 3.  Aortic Atherosclerosis (ICD10-I70.0). 4. Levoconvex lumbar scoliosis with rotary component. 5. Bony demineralization. Electronically Signed   By: Gaylyn Rong M.D.   On: 12/20/2020 15:19   US THORACENTESIS ASP PLEURAL SPACE W/IMG GUIDE  Result Date: 12/20/2020 INDICATION: Shortness of breath. Hypoxia. Bilateral pleural effusion, right greater than left. Request for therapeutic thoracentesis. EXAM: ULTRASOUND GUIDED RIGHT THORACENTESIS MEDICATIONS: 1%  plain lidocaine, 2 mL COMPLICATIONS: SIR Level A - No therapy, no consequence. Postprocedural chest radiograph demonstrates development of a tiny right apical presumably ex vacuo pneumothorax. PROCEDURE: An ultrasound guided  thoracentesis was thoroughly discussed with the patient and questions answered. The benefits, risks, alternatives and complications were also discussed. The patient understands and wishes to proceed with the procedure. Written consent was obtained. Ultrasound was performed to localize and mark an adequate pocket of fluid in the right chest. The area was then prepped and draped in the normal sterile fashion. 1% Lidocaine was used for local anesthesia. Under ultrasound guidance a 6 Fr Safe-T-Centesis catheter was introduced. Thoracentesis was performed. The catheter was removed and a dressing applied. FINDINGS: A total of approximately 400 mL of clear yellow fluid was removed. IMPRESSION: Successful ultrasound guided right thoracentesis yielding 400 mL of pleural fluid complicated by development of a tiny right apical presumably ex vacuo pneumothorax. Read by: Brayton El PA-C Electronically Signed   By: Simonne Come M.D.   On: 12/20/2020 17:25    Anti-infectives: Anti-infectives (From admission, onward)    Start     Dose/Rate Route Frequency Ordered Stop   12/17/20 0015  cefoTEtan (CEFOTAN) 2 g in sodium chloride 0.9 % 100 mL IVPB        2 g 200 mL/hr over 30 Minutes Intravenous Every 12 hours 12/16/20 2316 12/17/20 0254   12/16/20 1100  cefTRIAXone (ROCEPHIN) 2 g in sodium chloride 0.9 % 100 mL IVPB        2 g 200 mL/hr over 30 Minutes Intravenous Daily 12/16/20 1010 12/20/20 0853   January 14, 2021 1000  cefTRIAXone (ROCEPHIN) 2 g in sodium chloride 0.9 % 100 mL IVPB       See Hyperspace for full Linked Orders Report.   2 g 200 mL/hr over 30 Minutes Intravenous On call to O.R. 01-14-2021 0905 2021-01-14 1045   Jan 14, 2021 1000  metroNIDAZOLE (FLAGYL) IVPB 500 mg  Status:  Discontinued       See Hyperspace for full Linked Orders Report.   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 Michaell Cowing - 6/26 -Continue clears, startTPN - Mobilize. PT rec SNF - Pulm toilet -Replace potassium  FEN -clear liquids VTE - SCDs, Lovenox ID - Rocephin/Flagyl 6/26 - 7/1 Foley - out Follow-up - Dr. Michaell Cowing   Confusion - CTH negative. Improved. Avoid narcotics. Family has decided to make her DNR/DNI per [facility] note. ABL anemia -hemoglobin 9, stable Hypoxia - elevated bnp. Per trh  HTN AKI w/ CKD3 - Cr 0.92, stable   LOS: 6 days    Berna Bue MD [facility] Surgery 12/21/2020, 10:06 AM Please see Amion for pager number during day hours 7:00am-4:30pm

## 2020-12-21 NOTE — Progress Notes (Signed)
Peripherally Inserted Central Catheter Placement  The IV Nurse has discussed with the patient and/or persons authorized to consent for the patient, the purpose of this procedure and the potential benefits and risks involved with this procedure.  The benefits include less needle sticks, lab draws from the catheter, and the patient may be discharged home with the catheter. Risks include, but not limited to, infection, bleeding, blood clot (thrombus formation), and puncture of an artery; nerve damage and irregular heartbeat and possibility to perform a PICC exchange if needed/ordered by physician.  Alternatives to this procedure were also discussed.  Bard Power PICC patient education guide, fact sheet on infection prevention and patient information card has been provided to patient /or left at bedside.    PICC Placement Documentation  PICC Double Lumen 12/21/20 PICC Right Brachial 31 cm 0 cm (Active)  Indication for Insertion or Continuance of Line Administration of hyperosmolar/irritating solutions (i.e. TPN, Vancomycin, etc.) 12/21/20 1523  Exposed Catheter (cm) 0 cm 12/21/20 1523  Site Assessment Clean;Dry;Intact 12/21/20 1523  Lumen #1 Status Flushed;Blood return noted;Saline locked 12/21/20 1523  Lumen #2 Status Flushed;Blood return noted;Saline locked 12/21/20 1523  Dressing Type Transparent 12/21/20 1523  Dressing Status Clean;Intact;Dry 12/21/20 1523  Antimicrobial disc in place? Yes 12/21/20 1523  Dressing Change Due 12/28/20 12/21/20 1523       Audrie Gallus 12/21/2020, 3:25 PM

## 2020-12-21 NOTE — Progress Notes (Signed)
PHARMACY - TOTAL PARENTERAL NUTRITION CONSULT NOTE   Indication: Prolonged ileus  Patient Measurements: Height: 5' (152.4 cm) Weight: 47 kg (103 lb 9.9 oz) IBW/kg (Calculated) : 45.5 TPN AdjBW (KG): 35.6 Body mass index is 20.24 kg/m.  Assessment:  Pt is an 81 yoF initially presenting with abdominal pain, N/V. Found to have high grade bowel obstruction; failed to improve with NG decompression. Pt underwent proximal colectomy with ileal resection and lysis of adhesions on 6/26 for necrotic volvulus of ileum and colon. Pharmacy consulted to start TPN on 7/1 (POD#6).  Glucose / Insulin: No hx DM. BG goal <180.  Electrolytes: K (3.2), Ca (8.3) both slightly low; all other lytes WNL Renal: SCr 0.92, BUN 30 Hepatic: LFTs WNL Intake / Output; MIVF: Strict I/O not measured.  -UOP: 500 mL + 3 unmeasured; NG: 400 mL; Stool x2 -Pt removed NGT overnight GI Imaging: GI Surgeries / Procedures:  -6/26: Proximal colectomy with ileal resection  Central access: Pending PICC placement TPN start date: Possible 7/2 pending PICC placement  Nutritional Goals (pending RD recommendations...):  kCal: ___, Protein: ___, Fluid: ___ Goal TPN rate is ___ mL/hr (provides ___ g of protein and ___ kcals per day)  Current Nutrition:  Clear liquids and TPN NGT removed  Plan:   Now: Potassium chloride 10 mEq IV x4 runs  At 1800: Start TPN at 30 mL/hr Electrolytes in TPN: Standard concentrations Na 63mEq/L K 58mEq/L Ca 23mEq/L Mg 44mEq/L Phos 49mmol/L Cl:Ac 1:1 Add standard MVI and trace elements to TPN Initiate Sensitive q6h SSI and adjust as needed  No MIVF ordered; management per MD Monitor TPN labs on Mon/Thurs, recheck electrolytes with AM labs tomorrow  Cindi Carbon, PharmD 12/21/2020,11:24 AM

## 2020-12-21 NOTE — Progress Notes (Signed)
PROGRESS NOTE    Amber Rowland  ZOX:[id number] DOB: Jan 01, 1934 DOA: December 19, 2020 PCP: Marcine Matar, MD   Brief Narrative:  This 87 year old female with history of small bowel obstruction requiring lysis of adhesions in 2013, malnutrition, hypertension, CKD stage III who presented to the emergency department with complaints of severe abdominal pain, distention, nausea/vomiting.  CT abdomen showed high-grade small bowel obstruction with transition point at the right lower quadrant.  Patient was admitted for SBO, NG tube inserted.  General surgery consulted and following.  Underwent expiratory laparotomy on 6/26.  Hospital course remarkable for lethargy after receiving dose of Dilaudid, improvement with Narcan.  She was also hypotensive on 12/19/2020 and was moved to progressive unit.  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   CT abdomen showed high-grade small bowel obstruction with transition point at the right lower quadrant.   She underwent expiratory laparotomy on 6/26, s/p proximal colectomy with ileal resection, Continue antiemetics, pain management, IV fluids She was also on IV antibiotics, completed the course of 5 days postop. Currently on clear liquid diet.  General surgery recommended continue clear liquid diet and TPN   Acute hypoxic  respiratory failure / COPD:  She does not use oxygen at baseline. She has H/O COPD, not wheezing  at present.   Most likely she was volume overloaded. Chest x-ray showed bilateral features of pulmonary edema.     Continue Lasix 20 mg IV BID, She has received a lot of fluids during this hospitalization.  CT chest shows bilateral pleural effusion.  Patient underwent thoracocentesis 400 cc of clear fluid drained.   Hypertension:  She became hypotensive on 6/30.  Antihypertensives on hold.  Monitor blood pressure.   Continue midodrine 5 mg 3 times daily   Severe malnutrition:  Nutrition team will be consulted when solid diet will be started. General surgery recommended TPN.   CKD stage IIIb:  Currently kidney function at baseline.  Avoid nephrotoxins   Normocytic anemia:  Hemoglobin dropped to the range of 8, continue monitoring.  No evidence of active bleeding.   Iron studies show severe iron deficiency.  She was given a dose of IV iron infusion.   Oral supplementation will also be started on discharge..   Thrombocytopenia: Likely chronic.  Continue to monitor   Acute encephalopathy/lethargy:  Patient is very sensitive to opioids. She became lethargic on 12/17/2020 after receiving a dose of Dilaudid.   Symptoms improved with Narcan.  Avoid opiates as much as possible.   CT head did not show any acute intracranial abnormalities, showed chronic small vessel disease.   She is more alert and awake following commands.  Goals of care/debility/deconditioning: DNR.  PT/OT evaluation done, recommended skilled nursing facility  on discharge.TOC following    DVT prophylaxis: SCDs Code Status: DNR Family Communication: No family at bed side. Disposition Plan:   Status is:  Inpatient  Remains inpatient appropriate because:Inpatient level of care appropriate due to severity of illness  Dispo: The patient is from: SNF              Anticipated d/c is to: SNF              Patient currently is  not medically stable to d/c.   Difficult to place patient No  Consultants:  General Surgery  Procedures:  Exploratory Lapratomy  Antimicrobials:   Anti-infectives (From admission, onward)    Start     Dose/Rate Route Frequency Ordered Stop   12/17/20 0015  cefoTEtan (CEFOTAN) 2 g in sodium chloride 0.9 % 100 mL IVPB        2 g 200 mL/hr over 30 Minutes Intravenous Every 12 hours 12/16/20 2316 12/17/20 0254   12/16/20 1100  cefTRIAXone (ROCEPHIN) 2 g in sodium chloride 0.9 % 100 mL IVPB        2 g 200 mL/hr over 30 Minutes Intravenous Daily 12/16/20 1010 12/20/20 0853   12/10/2020 1000  cefTRIAXone (ROCEPHIN) 2 g in sodium chloride 0.9 % 100 mL IVPB       See Hyperspace for full Linked Orders Report.   2 g 200 mL/hr over 30 Minutes Intravenous On call to O.R. 12/11/2020 0905 12/02/2020 1045   12/14/2020 1000  metroNIDAZOLE (FLAGYL) IVPB 500 mg  Status:  Discontinued       See Hyperspace for full Linked Orders Report.   500 mg 100 mL/hr over 60 Minutes Intravenous Every 8 hours 12/17/2020 0905 12/20/20 1320        Subjective: Patient was seen and examined at bedside.  Overnight events noted.   Patient is very alert and oriented, seems at best baseline.  She reports having mild abdominal pain but otherwise feeling better.  Objective: Vitals:   12/20/20 1703 12/20/20 2008 12/21/20 0436 12/21/20 0438  BP: 116/84 118/71  (!) 106/53  Pulse:  90  92  Resp:  14  20  Temp:  98.1 F (36.7 C)  (!) 97.5 F (36.4 C)  TempSrc:    Oral  SpO2: 99% 96%  91%  Weight:   47 kg   Height:        Intake/Output Summary (Last 24 hours) at 12/21/2020 1210 Last data filed at 12/21/2020 0800 Gross per 24 hour  Intake 403.5 ml  Output 900 ml  Net -496.5 ml   Filed Weights   12/14/2020 0620 12/20/20 0500 12/21/20 0436  Weight: 35.6 kg 43.1 kg 47 kg    Examination:  General exam: Appears calm and comfortable, not in any acute distress. Respiratory system: Clear to auscultation. Respiratory effort  normal. Cardiovascular system: S1 & S2 heard, RRR. No JVD, murmurs, rubs, gallops or clicks. No pedal edema. Gastrointestinal system: Abdomen is nondistended, soft and nontender. No organomegaly or masses felt. Normal bowel sounds heard.  Midline surgical scar noted. Central nervous system: Alert and oriented. No focal neurological deficits. Extremities: Symmetric 5 x 5 power.  No edema, no cyanosis, no clubbing. Skin: No rashes, lesions or ulcers Psychiatry: Judgement and insight appear normal. Mood & affect appropriate.     Data Reviewed: I have personally reviewed following labs and imaging studies  CBC: Recent Labs  Lab 12/11/2020 0004 12/16/20 0547 12/17/20 0401 12/18/20 0417 12/19/20 0417 12/20/20 0428  WBC 8.8 10.2 11.3* 7.8 7.8 8.1  NEUTROABS 7.8*  --   --   --  6.9  --   HGB 15.1* 10.9* 10.8* 8.8* 9.7*  9.0*  HCT 48.9* 36.3 37.3 30.3* 31.7* 30.2*  MCV 93.1 96.5 100.3* 100.0 95.5 98.4  PLT 185 139* 136* 140* 151 165   Basic Metabolic Panel: Recent Labs  Lab 12/16/20 0422 12/17/20 0401 12/18/20 0417 12/19/20 0417 12/20/20 0428 12/21/20 0516 12/21/20 1136  NA 137 139 139 139 139 139  --   K 4.4 5.1 4.3 3.6 3.6 3.2*  --   CL 99 100 101 103 102 98  --   CO2 33* 33*  --   GLUCOSE 98 74 119* 112* 137* 119*  --   BUN 22 34* 44* 39* 32* 30*  --   CREATININE 1.13* 0.95 1.52* 1.10* 0.92 0.92  --   CALCIUM 8.9 8.7* 8.4* 8.4* 8.3* 8.3*  --   MG 2.0  --  2.5*  --   --   --  1.9  PHOS 4.5  --  3.5  --   --   --  2.6   GFR: Estimated Creatinine Clearance: 32.1 mL/min (by C-G formula based on SCr of 0.92 mg/dL). Liver Function Tests: Recent Labs  Lab 20-Dec-2020 0004  AST 25  ALT 13  ALKPHOS 147*  BILITOT 0.7  PROT 8.1  ALBUMIN 4.3   Recent Labs  Lab 20-Dec-2020 0004  LIPASE 23   No results for input(s): AMMONIA in the last 168 hours. Coagulation Profile: No results for input(s): INR, PROTIME in the last 168 hours. Cardiac Enzymes: No results for  input(s): CKTOTAL, CKMB, CKMBINDEX, TROPONINI in the last 168 hours. BNP (last 3 results) No results for input(s): PROBNP in the last 8760 hours. HbA1C: No results for input(s): HGBA1C in the last 72 hours. CBG: Recent Labs  Lab 12/17/20 0837 12/17/20 0924 12/21/20 0817 12/21/20 1152  GLUCAP 66* 101* 85 108*   Lipid Profile: No results for input(s): CHOL, HDL, LDLCALC, TRIG, CHOLHDL, LDLDIRECT in the last 72 hours. Thyroid Function Tests: No results for input(s): TSH, T4TOTAL, FREET4, T3FREE, THYROIDAB in the last 72 hours. Anemia Panel: No results for input(s): VITAMINB12, FOLATE, FERRITIN, TIBC, IRON, RETICCTPCT in the last 72 hours. Sepsis Labs: Recent Labs  Lab 2020/12/20 0004 12-20-20 0528 2020-12-20 0743 12-20-20 1246  LATICACIDVEN 3.1* 4.9* 3.7* 5.3*    Recent Results (from the past 240 hour(s))  Resp Panel by RT-PCR (Flu A&B, Covid) Nasopharyngeal Swab     Status: None   Collection Time: 12-20-20  5:28 AM   Specimen: Nasopharyngeal Swab; Nasopharyngeal(NP) swabs in vial transport medium  Result Value Ref Range Status   SARS Coronavirus 2 by RT PCR NEGATIVE NEGATIVE Final    Comment: (NOTE) SARS-CoV-2 target nucleic acids are NOT DETECTED.  The SARS-CoV-2 RNA is generally detectable in upper respiratory specimens during the acute phase of infection. The lowest concentration of SARS-CoV-2 viral copies this assay can detect is 138 copies/mL. A negative result does not preclude SARS-Cov-2 infection and should not be used as the sole basis for treatment or other patient management decisions. A negative result may occur with  improper specimen collection/handling, submission of specimen other than nasopharyngeal swab, presence of viral mutation(s) within the areas targeted by this assay, and inadequate number of viral copies(<138 copies/mL). A negative result must be combined with clinical observations, patient history, and epidemiological information. The expected  result is Negative.  Fact Sheet for Patients:  BloggerCourse.com  Fact Sheet for Healthcare Providers:  SeriousBroker.it  This test is no t yet approved or cleared by the [place] FDA and  has  been authorized for detection and/or diagnosis of SARS-CoV-2 by FDA under an Emergency Use Authorization (EUA). This EUA will remain  in effect (meaning this test can be used) for the duration of the COVID-19 declaration under Section 564(b)(1) of the Act, 21 U.S.C.section 360bbb-3(b)(1), unless the authorization is terminated  or revoked sooner.       Influenza A by PCR NEGATIVE NEGATIVE Final   Influenza B by PCR NEGATIVE NEGATIVE Final    Comment: (NOTE) The Xpert Xpress SARS-CoV-2/FLU/RSV plus assay is intended as an aid in the diagnosis of influenza from Nasopharyngeal swab specimens and should not be used as a sole basis for treatment. Nasal washings and aspirates are unacceptable for Xpert Xpress SARS-CoV-2/FLU/RSV testing.  Fact Sheet for Patients: BloggerCourse.comhttps://www.fda.gov/media/152166/download  Fact Sheet for Healthcare Providers: SeriousBroker.ithttps://www.fda.gov/media/152162/download  This test is not yet approved or cleared by the [place] FDA and has been authorized for detection and/or diagnosis of SARS-CoV-2 by FDA under an Emergency Use Authorization (EUA). This EUA will remain in effect (meaning this test can be used) for the duration of the COVID-19 declaration under Section 564(b)(1) of the Act, 21 U.S.C. section 360bbb-3(b)(1), unless the authorization is terminated or revoked.  Performed at [facility], [address] [place], [place] [address]   Surgical pcr screen     Status: None   Collection Time: 2020-09-22  8:54 AM   Specimen: Nasal Mucosa; Nasal Swab  Result Value Ref Range Status   MRSA, PCR NEGATIVE NEGATIVE Final   Staphylococcus aureus NEGATIVE NEGATIVE Final    Comment: (NOTE) The Xpert  SA Assay (FDA approved for NASAL specimens in patients 85 years of age and older), is one component of a comprehensive surveillance program. It is not intended to diagnose infection nor to guide or monitor treatment. Performed at [facility], [address] [place], [place] [address]     Radiology Studies: DG Chest 1 View  Result Date: 12/20/2020 CLINICAL DATA:  Post RIGHT thoracentesis with removal of 400 mL fluid EXAM: CHEST  1 VIEW COMPARISON:  CT 12/20/2020, radiograph 12/19/2020 FINDINGS: Interval decrease in the amount of gradient opacity in the right lung base with small residual layering effusion tracking into the fissure. Small apical pneumothorax. Overall appearance of a left pleural effusion is grossly similar to comparison radiography. Some additional areas of scarring and architectural distortion noted towards the bilateral lung apices, right greater than left. Portions of the cardiac silhouette are obscured by overlying opacity. Visible portions are unchanged prior. Chest wall deformity compatible with pectus excavatum seen on comparison. No acute abnormality of the chest wall. Telemetry leads overlie the chest. IMPRESSION: Post right thoracentesis with decrease in the amount of failing gradient opacity in the right lung base compatible with a diminished right pleural effusion. Small layering effusion remains with adjacent passive atelectasis. Small right apical pneumothorax. Stable appearance of a left pleural effusion.  No left pneumothorax. No other significant interval changes. These results will be called to the ordering clinician or representative by the Radiologist Assistant, and communication documented in the PACS or [employer] Dashboard. Electronically Signed   By: Kreg ShropshirePrice  DeHay M.D.   On: 12/20/2020 17:22   CT CHEST W CONTRAST  Result Date: 12/20/2020 CLINICAL DATA:  Respiratory failure EXAM: CT CHEST WITH CONTRAST TECHNIQUE: Multidetector CT imaging of the chest  was performed during intravenous contrast administration. CONTRAST:  75mL OMNIPAQUE IOHEXOL 300 MG/ML  SOLN COMPARISON:  Chest radiograph 12/19/2020 and CT a chest 12/29/2014 FINDINGS: Cardiovascular: Atherosclerotic calcification  of the thoracic aorta and branch vessels. Mild cardiomegaly. Mediastinum/Nodes: No overtly pathologic adenopathy is identified. Dilated upper thoracic esophagus with air-fluid level, the lower thoracic esophagus is somewhat displaced to the left possibly as result of the pectus excavatum. Lungs/Pleura: Large bilateral pleural effusions (nonspecific for transudative versus exudative etiology with passive atelectasis including complete atelectasis of both lower lobes. Biapical pleuroparenchymal scarring. Upper Abdomen: Ascites. Musculoskeletal: Pectus excavatum. Severe thoracic kyphosis. Substantial levoconvex upper thoracic and dextroconvex lower thoracic scoliosis. Marked paucity of adipose tissue. IMPRESSION: 1. Large bilateral pleural effusions with associated passive atelectasis. 2. Ascites. 3. Thoracic kyphosis with substantial thoracic scoliosis. 4. Pectus excavatum. 5.  Aortic Atherosclerosis (ICD10-I70.0).  Mild cardiomegaly. 6. Dilated upper thoracic esophagus with air-fluid level, and leftward deviation of the lower thoracic esophagus possibly partially related to the pectus excavatum. Electronically Signed   By: Gaylyn Rong M.D.   On: 12/20/2020 15:27   DG Abd Portable 1V  Result Date: 12/20/2020 CLINICAL DATA:  NG tube placement EXAM: PORTABLE ABDOMEN - 1 VIEW COMPARISON:  12/20/2020, CT 12-16-2020 FINDINGS: Esophageal tube tip has been placed, somewhat low lying tip at the right lower quadrant/pelvis. Residual air distension of central small bowel. Contrast in the bladder. IMPRESSION: 1. Esophageal tube tip somewhat low lying in appearance and projects at the level of right pelvis. Tip could potentially be within second portion of duodenum 2. Air distension of central  small bowel consistent with history of bowel obstruction Electronically Signed   By: Jasmine Pang M.D.   On: 12/20/2020 21:42   DG Abd Portable 1V  Result Date: 12/20/2020 CLINICAL DATA:  Abdominal pain and distension with bloating EXAM: PORTABLE ABDOMEN - 1 VIEW COMPARISON:  Multiple exams, including 2020/12/16 FINDINGS: The nasogastric tube is no longer visualized. There is substantial gaseous distention of the stomach. This displaces loops of bowel in the abdomen. Stable lines noted in the right lower quadrant. Aortoiliac atherosclerotic vascular disease. Bony demineralization. Levoconvex lumbar scoliosis with rotary component. There is also leftward rotation of the patient on today's exam. Left upper quadrant calcification corresponding to known left renal calculus. IMPRESSION: 1. Gaseous distention of the stomach. 2. Stable appearance of left renal calculus. 3.  Aortic Atherosclerosis (ICD10-I70.0). 4. Levoconvex lumbar scoliosis with rotary component. 5. Bony demineralization. Electronically Signed   By: Gaylyn Rong M.D.   On: 12/20/2020 15:19   [place] EKG SITE RITE  Result Date: 12/21/2020 If Site Rite image not attached, placement could not be confirmed due to current cardiac rhythm.  US THORACENTESIS ASP PLEURAL SPACE W/IMG GUIDE  Result Date: 12/20/2020 INDICATION: Shortness of breath. Hypoxia. Bilateral pleural effusion, right greater than left. Request for therapeutic thoracentesis. EXAM: ULTRASOUND GUIDED RIGHT THORACENTESIS MEDICATIONS: 1% plain lidocaine, 2 mL COMPLICATIONS: SIR Level A - No therapy, no consequence. Postprocedural chest radiograph demonstrates development of a tiny right apical presumably ex vacuo pneumothorax. PROCEDURE: An ultrasound guided thoracentesis was thoroughly discussed with the patient and questions answered. The benefits, risks, alternatives and complications were also discussed. The patient understands and wishes to proceed with the procedure. Written  consent was obtained. Ultrasound was performed to localize and mark an adequate pocket of fluid in the right chest. The area was then prepped and draped in the normal sterile fashion. 1% Lidocaine was used for local anesthesia. Under ultrasound guidance a 6 Fr Safe-T-Centesis catheter was introduced. Thoracentesis was performed. The catheter was removed and a dressing applied. FINDINGS: A total of approximately 400 mL of clear yellow fluid was removed. IMPRESSION: Successful ultrasound  guided right thoracentesis yielding 400 mL of pleural fluid complicated by development of a tiny right apical presumably ex vacuo pneumothorax. Read by: Brayton El PA-C Electronically Signed   By: Simonne Come M.D.   On: 12/20/2020 17:25     Scheduled Meds:  bisacodyl  10 mg Rectal Daily   brimonidine  1 drop Both Eyes BID   dorzolamide  1 drop Both Eyes BID   enoxaparin (LOVENOX) injection  30 mg Subcutaneous Q24H   feeding supplement  1 Container Oral TID BM   furosemide  20 mg Intravenous Q12H   lip balm  1 application Topical BID   midodrine  5 mg Oral TID WC   potassium chloride  40 mEq Oral Once   Continuous Infusions:  sodium chloride 250 mL (12/20/20 0822)   ondansetron (ZOFRAN) IV     TPN ADULT (ION)       LOS: 6 days    Time spent: 25 mins    Cipriano Bunker, MD [facility]   If 7PM-7AM, please contact night-coverage

## 2020-12-22 LAB — PREALBUMIN: Prealbumin: 11.3 mg/dL — ABNORMAL LOW (ref 18–38)

## 2020-12-22 LAB — COMPREHENSIVE METABOLIC PANEL
ALT: 12 U/L (ref 0–44)
AST: 16 U/L (ref 15–41)
Albumin: 2.1 g/dL — ABNORMAL LOW (ref 3.5–5.0)
Alkaline Phosphatase: 47 U/L (ref 38–126)
Anion gap: 4 — ABNORMAL LOW (ref 5–15)
BUN: 41 mg/dL — ABNORMAL HIGH (ref 8–23)
CO2: 34 mmol/L — ABNORMAL HIGH (ref 22–32)
Calcium: 8.1 mg/dL — ABNORMAL LOW (ref 8.9–10.3)
Chloride: 100 mmol/L (ref 98–111)
Creatinine, Ser: 0.9 mg/dL (ref 0.44–1.00)
GFR, Estimated: >60 mL/min (ref >60)
Glucose, Bld: 137 mg/dL — ABNORMAL HIGH (ref 70–99)
Potassium: 4.3 mmol/L (ref 3.5–5.1)
Sodium: 138 mmol/L (ref 135–145)
Total Bilirubin: 0.5 mg/dL (ref 0.3–1.2)
Total Protein: 4.3 g/dL — ABNORMAL LOW (ref 6.5–8.1)

## 2020-12-22 LAB — DIFFERENTIAL
Abs Immature Granulocytes: 0.1 10*3/uL — ABNORMAL HIGH (ref 0.00–0.07)
Basophils Absolute: 0 10*3/uL (ref 0.0–0.1)
Basophils Relative: 0 %
Eosinophils Absolute: 0.1 10*3/uL (ref 0.0–0.5)
Eosinophils Relative: 1 %
Immature Granulocytes: 1 %
Lymphocytes Relative: 7 %
Lymphs Abs: 0.7 10*3/uL (ref 0.7–4.0)
Monocytes Absolute: 0.9 10*3/uL (ref 0.1–1.0)
Monocytes Relative: 9 %
Neutro Abs: 7.8 10*3/uL — ABNORMAL HIGH (ref 1.7–7.7)
Neutrophils Relative %: 82 %

## 2020-12-22 LAB — MAGNESIUM: Magnesium: 2 mg/dL (ref 1.7–2.4)

## 2020-12-22 LAB — PREPARE RBC (CROSSMATCH)

## 2020-12-22 LAB — PHOSPHORUS: Phosphorus: 1.8 mg/dL — ABNORMAL LOW (ref 2.5–4.6)

## 2020-12-22 LAB — CBC
HCT: 22.6 % — ABNORMAL LOW (ref 36.0–46.0)
Hemoglobin: 6.5 g/dL — CL (ref 12.0–15.0)
MCH: 28.9 pg (ref 26.0–34.0)
MCHC: 28.8 g/dL — ABNORMAL LOW (ref 30.0–36.0)
MCV: 100.4 fL — ABNORMAL HIGH (ref 80.0–100.0)
Platelets: 241 10*3/uL (ref 150–400)
RBC: 2.25 MIL/uL — ABNORMAL LOW (ref 3.87–5.11)
RDW: 14.4 % (ref 11.5–15.5)
WBC: 9.5 10*3/uL (ref 4.0–10.5)
nRBC: 0.9 % — ABNORMAL HIGH (ref 0.0–0.2)

## 2020-12-22 LAB — GLUCOSE, CAPILLARY
Glucose-Capillary: 134 mg/dL — ABNORMAL HIGH (ref 70–99)
Glucose-Capillary: 136 mg/dL — ABNORMAL HIGH (ref 70–99)
Glucose-Capillary: 158 mg/dL — ABNORMAL HIGH (ref 70–99)
Glucose-Capillary: 178 mg/dL — ABNORMAL HIGH (ref 70–99)

## 2020-12-22 LAB — HEMOGLOBIN AND HEMATOCRIT, BLOOD
HCT: 29.1 % — ABNORMAL LOW (ref 36.0–46.0)
Hemoglobin: 9.1 g/dL — ABNORMAL LOW (ref 12.0–15.0)

## 2020-12-22 LAB — TRIGLYCERIDES: Triglycerides: 68 mg/dL (ref <150)

## 2020-12-22 MED ORDER — SODIUM CHLORIDE 0.9% IV SOLUTION: Freq: Once | INTRAVENOUS | Status: DC

## 2020-12-22 MED ORDER — SODIUM PHOSPHATES 45 MMOLE/15ML IV SOLN
10.0000 mmol | Freq: Once | INTRAVENOUS | Status: AC
  Administered 2020-12-22: 10 mmol via INTRAVENOUS
  Filled 2020-12-22: qty 3.33

## 2020-12-22 MED ORDER — ENSURE ENLIVE PO LIQD
237.0000 mL | Freq: Three times a day (TID) | ORAL | Status: DC
  Administered 2020-12-22 – 2020-12-25 (×9): 237 mL via ORAL

## 2020-12-22 MED ORDER — TRAVASOL 10 % IV SOLN
INTRAVENOUS | Status: AC
  Filled 2020-12-22: qty 374.4

## 2020-12-22 MED ORDER — SIMETHICONE 40 MG/0.6ML PO SUSP
40.0000 mg | Freq: Four times a day (QID) | ORAL | Status: DC | PRN
  Administered 2020-12-22: 40 mg via ORAL
  Filled 2020-12-22 (×2): qty 0.6

## 2020-12-22 MED ORDER — MIDODRINE HCL 5 MG PO TABS
10.0000 mg | ORAL_TABLET | Freq: Three times a day (TID) | ORAL | Status: DC
  Administered 2020-12-22 – 2020-12-23 (×3): 10 mg via ORAL
  Filled 2020-12-22 (×3): qty 2

## 2020-12-22 NOTE — Progress Notes (Signed)
PROGRESS NOTE    Amber Rowland  IOX:[id number] DOB: 1934-04-17 DOA: 12-29-2020 PCP: Marcine Matar, MD   Brief Narrative:  This 87 year old female with history of small bowel obstruction requiring lysis of adhesions in 2013, malnutrition, hypertension, CKD stage III who presented to the emergency department with complaints of severe abdominal pain, distention, nausea/vomiting.  CT abdomen showed high-grade small bowel obstruction with transition point at the right lower quadrant.  Patient was admitted for SBO, NG tube inserted.  General surgery consulted and following.  Underwent expiratory laparotomy on 6/26.  Hospital course remarkable for lethargy after receiving a dose of Dilaudid, improvement with Narcan.  She was also hypotensive on 12/19/2020 and was moved to progressive unit.  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   CT abdomen showed high-grade small bowel obstruction with transition point at the right lower quadrant.   She underwent expiratory laparotomy on 6/26, s/p proximal colectomy with ileal resection. Continue antiemetics, pain management, IV fluids She was also on IV antibiotics, completed the course of 5 days postop. Currently on clear liquid diet.  General surgery recommended continue clear liquid diet and  continue TPN.    Acute hypoxic respiratory failure / COPD:  She does not use oxygen at baseline. She has H/O COPD, not wheezing  at present.   Most likely she was volume overloaded. Chest x-ray showed bilateral features of pulmonary edema.     Continue Lasix 20 mg IV BID, She has received a lot of fluids during this hospitalization.  CT chest shows bilateral pleural effusion.  Patient underwent Right thoracocentesis,  400 cc of clear fluid drained. Respiratory status improved.   Hypertension:  She became hypotensive on 6/30.  Antihypertensives on hold.  Monitor blood pressure.   Continue midodrine 5 mg 3 times daily.   Severe malnutrition:  Nutrition team consulted ,  General surgery recommended TPN. Continue TPN   CKD stage IIIb:  Currently kidney function at baseline.  Avoid nephrotoxins. Continue to moniter   Normocytic anemia:  Hemoglobin dropped to 6.8, continue monitoring.  No evidence of active bleeding.   Iron studies show severe iron deficiency.  She was given a dose of IV iron infusion.   Transfuse 1 unit PRBC, follow up post transfusion CBC.   Thrombocytopenia: > Resolved.   Acute encephalopathy/lethargy:  Patient is very sensitive to opioids. She became lethargic on 12/17/2020 after receiving a dose of Dilaudid.   Symptoms improved with Narcan.  Avoid opiates as much as possible.   CT head did not show any acute intracranial abnormalities, showed chronic small vessel disease.   She is more alert and awake and following commands.  Goals of care/debility/deconditioning:  DNR.  PT/OT evaluation done, recommended skilled nursing facility  on discharge.TOC following    DVT prophylaxis: SCDs Code Status: DNR Family Communication: No family at bed side. Disposition Plan:  Status is: Inpatient  Remains inpatient appropriate because:Inpatient level of care appropriate due to severity of illness  Dispo: The patient is from: SNF              Anticipated d/c is to: SNF               Patient currently is not medically stable to d/c.   Difficult to place patient No  Consultants:  General Surgery  Procedures:  Exploratory Lapratomy  Antimicrobials:   Anti-infectives (From admission, onward)    Start     Dose/Rate Route Frequency Ordered Stop   12/17/20 0015  cefoTEtan (CEFOTAN) 2 g in sodium chloride 0.9 % 100 mL IVPB        2 g 200 mL/hr over 30 Minutes Intravenous Every 12 hours 12/16/20 2316 12/17/20 0254   12/16/20 1100  cefTRIAXone (ROCEPHIN) 2 g in sodium chloride 0.9 % 100 mL IVPB        2 g 200 mL/hr over 30 Minutes Intravenous Daily 12/16/20 1010 12/20/20 0853   12/14/2020 1000  cefTRIAXone (ROCEPHIN) 2 g in sodium chloride 0.9 % 100 mL IVPB       See Hyperspace for full Linked Orders Report.   2 g 200 mL/hr over 30 Minutes Intravenous On call to O.R. 12/10/2020 0905 11/27/2020 1045   11/22/2020 1000  metroNIDAZOLE (FLAGYL) IVPB 500 mg  Status:  Discontinued       See Hyperspace for full Linked Orders Report.   500 mg 100 mL/hr over 60 Minutes Intravenous Every 8 hours 12/19/2020 0905 12/20/20 1320        Subjective: Patient was seen and examined at bedside.  Overnight events noted.  She wants to have sleeping aid at night. Patient is very alert and oriented, seems at best baseline.  She was started on TPN, tolerating well. She denies N/V/D. She reports having mild abdominal pain but otherwise feeling better.   Objective: Vitals:   12/22/20 0540 12/22/20 0730 12/22/20 0906 12/22/20 0938  BP: 90/63  (!) 93/49 (!) 90/53  Pulse: 100 (!) 104 100 97  Resp: 20  (!) 24 (!) 22  Temp: (!) 97.4 F (36.3 C)  97.6 F (36.4 C) (!) 97.5 F (36.4 C)  TempSrc:   Oral Oral  SpO2: 100% 93% 100% 99%  Weight:      Height:        Intake/Output Summary (Last 24 hours) at 12/22/2020 1105 Last data filed at 12/22/2020 0624 Gross per 24 hour  Intake 579.11 ml  Output --  Net 579.11 ml   Filed Weights   12/01/2020 0620 12/20/20 0500 12/21/20 0436  Weight: 35.6 kg 43.1 kg  47 kg    Examination:  General exam: Appears calm and comfortable, not in any acute distress. Deconditioned. Respiratory system: Clear to auscultation. Respiratory effort normal. Cardiovascular system: S1 & S2 heard, RRR. No JVD, murmurs, rubs, gallops or clicks. No pedal edema. Gastrointestinal system: Abdomen is distended, soft and nontender. No organomegaly or masses felt. Normal bowel sounds heard.  Midline surgical scar noted. Central nervous system: Alert and oriented. No focal neurological deficits. Extremities: Symmetric 5 x 5 power.  No edema, no cyanosis, no clubbing. Skin: No rashes, lesions or ulcers Psychiatry: Judgement and insight appear normal. Mood & affect appropriate.     Data Reviewed: I have personally reviewed following labs and imaging studies  CBC: Recent Labs  Lab 12/17/20 0401 12/18/20 0417 12/19/20 0417 12/20/20 0428 12/22/20 0331  WBC 11.3* 7.8 7.8 8.1  9.5  NEUTROABS  --   --  6.9  --  7.8*  HGB 10.8* 8.8* 9.7* 9.0* 6.5*  HCT 37.3 30.3* 31.7* 30.2* 22.6*  MCV 100.3* 100.0 95.5 98.4 100.4*  PLT 136* 140* 151 165 241   Basic Metabolic Panel: Recent Labs  Lab 12/16/20 0422 12/17/20 0401 12/18/20 0417 12/19/20 0417 12/20/20 0428 12/21/20 0516 12/21/20 1136 12/22/20 0331  NA 137   < > 139 139 139 139  --  138  K 4.4   < > 4.3 3.6 3.6 3.2*  --  4.3  CL 99   < > 101 103 102 98  --  100  CO2 33*   < > 29 29 31  33*  --  34*  GLUCOSE 98   < > 119* 112* 137* 119*  --  137*  BUN 22   < > 44* 39* 32* 30*  --  41*  CREATININE 1.13*   < > 1.52* 1.10* 0.92 0.92  --  0.90  CALCIUM 8.9   < > 8.4* 8.4* 8.3* 8.3*  --  8.1*  MG 2.0  --  2.5*  --   --   --  1.9 2.0  PHOS 4.5  --  3.5  --   --   --  2.6 1.8*   < > = values in this interval not displayed.   GFR: Estimated Creatinine Clearance: 32.8 mL/min (by C-G formula based on SCr of 0.9 mg/dL). Liver Function Tests: Recent Labs  Lab 12/22/20 0331  AST 16  ALT 12  ALKPHOS 47  BILITOT 0.5  PROT  4.3*  ALBUMIN 2.1*   No results for input(s): LIPASE, AMYLASE in the last 168 hours.  No results for input(s): AMMONIA in the last 168 hours. Coagulation Profile: No results for input(s): INR, PROTIME in the last 168 hours. Cardiac Enzymes: No results for input(s): CKTOTAL, CKMB, CKMBINDEX, TROPONINI in the last 168 hours. BNP (last 3 results) No results for input(s): PROBNP in the last 8760 hours. HbA1C: No results for input(s): HGBA1C in the last 72 hours. CBG: Recent Labs  Lab 12/21/20 0817 12/21/20 1152 12/21/20 1652 12/22/20 0027 12/22/20 0630  GLUCAP 85 108* 135* 136* 178*   Lipid Profile: Recent Labs    12/22/20 0331  TRIG 68   Thyroid Function Tests: No results for input(s): TSH, T4TOTAL, FREET4, T3FREE, THYROIDAB in the last 72 hours. Anemia Panel: No results for input(s): VITAMINB12, FOLATE, FERRITIN, TIBC, IRON, RETICCTPCT in the last 72 hours. Sepsis Labs: Recent Labs  Lab 2020-12-25 1246  LATICACIDVEN 5.3*    Recent Results (from the past 240 hour(s))  Resp Panel by RT-PCR (Flu A&B, Covid) Nasopharyngeal Swab     Status: None   Collection Time: Dec 25, 2020  5:28 AM   Specimen: Nasopharyngeal Swab; Nasopharyngeal(NP) swabs in vial transport medium  Result Value Ref Range Status   SARS Coronavirus 2 by RT PCR NEGATIVE NEGATIVE Final    Comment: (NOTE) SARS-CoV-2 target nucleic acids are NOT DETECTED.  The SARS-CoV-2 RNA is generally detectable in upper respiratory specimens during the acute phase of infection. The lowest concentration of SARS-CoV-2 viral copies this assay can detect is 138 copies/mL. A negative result does not preclude SARS-Cov-2 infection and should not be used as the sole basis for treatment or other patient management decisions. A negative result may occur with  improper specimen collection/handling, submission of specimen other than nasopharyngeal swab, presence of viral mutation(s) within the areas targeted by this assay, and  inadequate number  of viral copies(<138 copies/mL). A negative result must be combined with clinical observations, patient history, and epidemiological information. The expected result is Negative.  Fact Sheet for Patients:  BloggerCourse.com  Fact Sheet for Healthcare Providers:  SeriousBroker.it  This test is no t yet approved or cleared by the [place] FDA and  has been authorized for detection and/or diagnosis of SARS-CoV-2 by FDA under an Emergency Use Authorization (EUA). This EUA will remain  in effect (meaning this test can be used) for the duration of the COVID-19 declaration under Section 564(b)(1) of the Act, 21 U.S.C.section 360bbb-3(b)(1), unless the authorization is terminated  or revoked sooner.       Influenza A by PCR NEGATIVE NEGATIVE Final   Influenza B by PCR NEGATIVE NEGATIVE Final    Comment: (NOTE) The Xpert Xpress SARS-CoV-2/FLU/RSV plus assay is intended as an aid in the diagnosis of influenza from Nasopharyngeal swab specimens and should not be used as a sole basis for treatment. Nasal washings and aspirates are unacceptable for Xpert Xpress SARS-CoV-2/FLU/RSV testing.  Fact Sheet for Patients: BloggerCourse.com  Fact Sheet for Healthcare Providers: SeriousBroker.it  This test is not yet approved or cleared by the [place] FDA and has been authorized for detection and/or diagnosis of SARS-CoV-2 by FDA under an Emergency Use Authorization (EUA). This EUA will remain in effect (meaning this test can be used) for the duration of the COVID-19 declaration under Section 564(b)(1) of the Act, 21 U.S.C. section 360bbb-3(b)(1), unless the authorization is terminated or revoked.  Performed at [facility], [address] [place], [place] [address]   Surgical pcr screen     Status: None   Collection Time: 12/09/2020  8:54 AM    Specimen: Nasal Mucosa; Nasal Swab  Result Value Ref Range Status   MRSA, PCR NEGATIVE NEGATIVE Final   Staphylococcus aureus NEGATIVE NEGATIVE Final    Comment: (NOTE) The Xpert SA Assay (FDA approved for NASAL specimens in patients 15 years of age and older), is one component of a comprehensive surveillance program. It is not intended to diagnose infection nor to guide or monitor treatment. Performed at [facility], [address] [place], [place] [address]     Radiology Studies: DG Chest 1 View  Result Date: 12/20/2020 CLINICAL DATA:  Post RIGHT thoracentesis with removal of 400 mL fluid EXAM: CHEST  1 VIEW COMPARISON:  CT 12/20/2020, radiograph 12/19/2020 FINDINGS: Interval decrease in the amount of gradient opacity in the right lung base with small residual layering effusion tracking into the fissure. Small apical pneumothorax. Overall appearance of a left pleural effusion is grossly similar to comparison radiography. Some additional areas of scarring and architectural distortion noted towards the bilateral lung apices, right greater than left. Portions of the cardiac silhouette are obscured by overlying opacity. Visible portions are unchanged prior. Chest wall deformity compatible with pectus excavatum seen on comparison. No acute abnormality of the chest wall. Telemetry leads overlie the chest. IMPRESSION: Post right thoracentesis with decrease in the amount of failing gradient opacity in the right lung base compatible with a diminished right pleural effusion. Small layering effusion remains with adjacent passive atelectasis. Small right apical pneumothorax. Stable appearance of a left pleural effusion.  No left pneumothorax. No other significant interval changes. These results will be called to the ordering clinician or representative by the Radiologist Assistant, and communication documented in the PACS or [employer]. Electronically Signed   By: Kreg Shropshire M.D.    On: 12/20/2020 17:22  CT CHEST W CONTRAST  Result Date: 12/20/2020 CLINICAL DATA:  Respiratory failure EXAM: CT CHEST WITH CONTRAST TECHNIQUE: Multidetector CT imaging of the chest was performed during intravenous contrast administration. CONTRAST:  75mL OMNIPAQUE IOHEXOL 300 MG/ML  SOLN COMPARISON:  Chest radiograph 12/19/2020 and CT a chest 12/29/2014 FINDINGS: Cardiovascular: Atherosclerotic calcification of the thoracic aorta and branch vessels. Mild cardiomegaly. Mediastinum/Nodes: No overtly pathologic adenopathy is identified. Dilated upper thoracic esophagus with air-fluid level, the lower thoracic esophagus is somewhat displaced to the left possibly as result of the pectus excavatum. Lungs/Pleura: Large bilateral pleural effusions (nonspecific for transudative versus exudative etiology with passive atelectasis including complete atelectasis of both lower lobes. Biapical pleuroparenchymal scarring. Upper Abdomen: Ascites. Musculoskeletal: Pectus excavatum. Severe thoracic kyphosis. Substantial levoconvex upper thoracic and dextroconvex lower thoracic scoliosis. Marked paucity of adipose tissue. IMPRESSION: 1. Large bilateral pleural effusions with associated passive atelectasis. 2. Ascites. 3. Thoracic kyphosis with substantial thoracic scoliosis. 4. Pectus excavatum. 5.  Aortic Atherosclerosis (ICD10-I70.0).  Mild cardiomegaly. 6. Dilated upper thoracic esophagus with air-fluid level, and leftward deviation of the lower thoracic esophagus possibly partially related to the pectus excavatum. Electronically Signed   By: Gaylyn Rong M.D.   On: 12/20/2020 15:27   DG Abd Portable 1V  Result Date: 12/20/2020 CLINICAL DATA:  NG tube placement EXAM: PORTABLE ABDOMEN - 1 VIEW COMPARISON:  12/20/2020, CT Dec 27, 2020 FINDINGS: Esophageal tube tip has been placed, somewhat low lying tip at the right lower quadrant/pelvis. Residual air distension of central small bowel. Contrast in the bladder. IMPRESSION:  1. Esophageal tube tip somewhat low lying in appearance and projects at the level of right pelvis. Tip could potentially be within second portion of duodenum 2. Air distension of central small bowel consistent with history of bowel obstruction Electronically Signed   By: Jasmine Pang M.D.   On: 12/20/2020 21:42   DG Abd Portable 1V  Result Date: 12/20/2020 CLINICAL DATA:  Abdominal pain and distension with bloating EXAM: PORTABLE ABDOMEN - 1 VIEW COMPARISON:  Multiple exams, including 12/27/20 FINDINGS: The nasogastric tube is no longer visualized. There is substantial gaseous distention of the stomach. This displaces loops of bowel in the abdomen. Stable lines noted in the right lower quadrant. Aortoiliac atherosclerotic vascular disease. Bony demineralization. Levoconvex lumbar scoliosis with rotary component. There is also leftward rotation of the patient on today's exam. Left upper quadrant calcification corresponding to known left renal calculus. IMPRESSION: 1. Gaseous distention of the stomach. 2. Stable appearance of left renal calculus. 3.  Aortic Atherosclerosis (ICD10-I70.0). 4. Levoconvex lumbar scoliosis with rotary component. 5. Bony demineralization. Electronically Signed   By: Gaylyn Rong M.D.   On: 12/20/2020 15:19   [place] EKG SITE RITE  Result Date: 12/21/2020 If Site Rite image not attached, placement could not be confirmed due to current cardiac rhythm.  US THORACENTESIS ASP PLEURAL SPACE W/IMG GUIDE  Result Date: 12/20/2020 INDICATION: Shortness of breath. Hypoxia. Bilateral pleural effusion, right greater than left. Request for therapeutic thoracentesis. EXAM: ULTRASOUND GUIDED RIGHT THORACENTESIS MEDICATIONS: 1% plain lidocaine, 2 mL COMPLICATIONS: SIR Level A - No therapy, no consequence. Postprocedural chest radiograph demonstrates development of a tiny right apical presumably ex vacuo pneumothorax. PROCEDURE: An ultrasound guided thoracentesis was thoroughly discussed with  the patient and questions answered. The benefits, risks, alternatives and complications were also discussed. The patient understands and wishes to proceed with the procedure. Written consent was obtained. Ultrasound was performed to localize and mark an adequate pocket of fluid in the right chest. The area  was then prepped and draped in the normal sterile fashion. 1% Lidocaine was used for local anesthesia. Under ultrasound guidance a 6 Fr Safe-T-Centesis catheter was introduced. Thoracentesis was performed. The catheter was removed and a dressing applied. FINDINGS: A total of approximately 400 mL of clear yellow fluid was removed. IMPRESSION: Successful ultrasound guided right thoracentesis yielding 400 mL of pleural fluid complicated by development of a tiny right apical presumably ex vacuo pneumothorax. Read by: Brayton ElKevin Bruning PA-C Electronically Signed   By: Simonne ComeJohn  Watts M.D.   On: 12/20/2020 17:25     Scheduled Meds:  sodium chloride   Intravenous Once   sodium chloride   Intravenous Once   bisacodyl  10 mg Rectal Daily   brimonidine  1 drop Both Eyes BID   Chlorhexidine Gluconate Cloth  6 each Topical Daily   dorzolamide  1 drop Both Eyes BID   enoxaparin (LOVENOX) injection  30 mg Subcutaneous Q24H   feeding supplement  237 mL Oral TID BM   furosemide  20 mg Intravenous Q12H   insulin aspart  0-9 Units Subcutaneous Q6H   lip balm  1 application Topical BID   midodrine  5 mg Oral TID WC   Continuous Infusions:  sodium chloride 250 mL (12/21/20 1543)   ondansetron (ZOFRAN) IV     sodium phosphate  Dextrose 5% IVPB     TPN ADULT (ION) 30 mL/hr at 12/21/20 1735   TPN ADULT (ION)       LOS: 7 days    Time spent: 25 mins    Josealfredo Adkins, MD [facility]   If 7PM-7AM, please contact night-coverage

## 2020-12-22 NOTE — Progress Notes (Signed)
   12/22/20 0906  Assess: MEWS Score  Temp 97.6 F (36.4 C)  BP (!) 93/49  Pulse Rate 100  ECG Heart Rate 96  Resp (!) 24  SpO2 100 %  O2 Device Nasal Cannula  O2 Flow Rate (L/min) 4 L/min  Assess: MEWS Score  MEWS Temp 0  MEWS Systolic 1  MEWS Pulse 0  MEWS RR 1  MEWS LOC 0  MEWS Score 2  MEWS Score Color Yellow  Assess: if the MEWS score is Yellow or Red  Were vital signs taken at a resting state? Yes  Focused Assessment Change from prior assessment (see assessment flowsheet)  Does the patient meet 2 or more of the SIRS criteria? Yes  Does the patient have a confirmed or suspected source of infection? No  MEWS guidelines implemented *See Row Information* Yes  Treat  MEWS Interventions Administered scheduled meds/treatments;Escalated (See documentation below)  Pain Scale PAINAD  Pain Type Surgical pain  Pain Location Abdomen  Pain Orientation Upper;Mid  Pain Descriptors / Indicators Moaning;Discomfort  Pain Frequency Intermittent  Pain Onset On-going  Take Vital Signs  Increase Vital Sign Frequency  Yellow: Q 2hr X 2 then Q 4hr X 2, if remains yellow, continue Q 4hrs  Escalate  MEWS: Escalate Yellow: discuss with charge nurse/RN and consider discussing with provider and RRT  Notify: Charge Nurse/RN  Name of Charge Nurse/RN Notified Baird Lyons  Date Charge Nurse/RN Notified 12/22/20  Time Charge Nurse/RN Notified [id number]  Notify: Provider  Provider Name/Title Lucianne Muss  Date Provider Notified 12/22/20  Time Provider Notified 0920  Notification Type Page  Notification Reason Change in status  Provider response No new orders  Date of Provider Response 12/22/20  Time of Provider Response 0930  Document  Patient Outcome Not stable and remains on department  Assess: SIRS CRITERIA  SIRS Temperature  0  SIRS Pulse 1  SIRS Respirations  1  SIRS WBC 0  SIRS Score Sum  2

## 2020-12-22 NOTE — Progress Notes (Signed)
Initial Nutrition Assessment  DOCUMENTATION CODES:   Underweight, suspect severe malnutrition but unable to confirm without NFPE  INTERVENTION:   - TPN dosing per Pharmacy  Monitor magnesium, potassium, and phosphorus twice daily for at least 3 days, MD to replete as needed, as pt is at risk for refeeding syndrome given history of severe malnutrition, inadequate oral intake since admission (x 7 days).  - Ensure Enlive po TID, each supplement provides 350 kcal and 20 grams of protein  NUTRITION DIAGNOSIS:   Inadequate oral intake related to altered GI function as evidenced by meal completion < 25%.  GOAL:   Patient will meet greater than or equal to 90% of their needs  MONITOR:   PO intake, Supplement acceptance, Diet advancement, Labs, Weight trends, Skin, I & O's  REASON FOR ASSESSMENT:   Consult New TPN/TNA  ASSESSMENT:   87 year old female who presented to the ED on 6/26 with abdominal pain and distention, N/V. PMH of prior SBO requiring lysis of adhesions in 2013, severe malnutrition, HTN, CKD stage III, COPD, chronic constipation, anxiety. 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  6/26 - s/p proximal colectomy with ileal resection, lysis of adhesions 6/29 - pt removed NGT 6/30 - clear liquids 7/01 - NPO due to significant gastric distention, NGT placed, s/p R thoracentesis with 400 ml fluid removed 7/02 - pt removed NG tube, clear liquids, PICC placed, TPN started 7/03 - full liquids  RD working remotely. Unable to reach pt via phone call to room. Will attempt to obtain diet and weight history from pt at follow-up.  Per operative notes, pt had almost 2 feet of frankly ischemic and necrotic small bowel. Pt with proximal two thirds of small intestine found to be viable.  Received consult for new TPN. PICC placed yesterday and TPN started @ 30 ml/hr at 1735 per [facility] documentation.  Surgery advanced pt to full liquid diet today. RD will change supplement from Boost Breeze to Ensure Enlive to provide more kcal and protein.  Therapies recommending SNF.  Admit weight: 37 kg Current weight: 47 kg  Per notes, pt is fluid overloaded. Lasix ordered. Dry weight is likely closer to admit weight of 37 kg which is consistent with BMI of 15.93 (underweight). Suspect pt with severe malnutrition but RD unable to confirm without NFPE.  Meal Completion: 0-10% of clear liquid meal trays  Medications reviewed and include: dulcolax, Boost Breeze TID, IV lasix, SSI q 6 hours, IV sodium phosphate 10 mmol once  Labs reviewed: BUN 41, phosphorus 1.8 (being replaced), hemoglobin 6.5, HCT 22.6 CBG's: 108-178 x 24 hours  No UOP documented x 24 hours I/O's: +6.6 L since admit  NUTRITION - FOCUSED PHYSICAL EXAM:  Unable to complete at this time. RD working remotely.  Diet Order:   Diet Order             Diet full liquid Room service appropriate? Yes; Fluid consistency: Thin  Diet effective now                   EDUCATION NEEDS:   Not appropriate for education at this time  Skin:  Skin Assessment: Skin Integrity Issues: Stage II: coccyx Incisions: abdomen  Last BM:  12/21/20 small type 7  Height:   Ht Readings from Last 1 Encounters:  01/12/21 5' (1.524 m)    Weight:   Wt Readings from Last 1 Encounters:  12/21/20 47 kg  Ideal Body Weight:  45.5 kg  BMI:  Body mass index is 20.24 kg/m.  Estimated Nutritional Needs:   Kcal:  1400-1600  Protein:  65-80 grams  Fluid:  >/= 1.4 L/day    Mertie Clause, MS, RD, LDN Inpatient Clinical Dietitian Please see AMiON for contact information.

## 2020-12-22 NOTE — Progress Notes (Signed)
Patient's BP soft. Md notified with new orders received.Melton Alar, RN

## 2020-12-22 NOTE — Progress Notes (Signed)
7 Days Post-Op  Subjective: CC: Doing well; asking if she can have sleeping pill at night. Continues to have bowel movements and flatus.  Denies abdominal pain or nausea and has taken in clears without difficulty.  Hgb noted 6.5. Would recheck and confirm, if accurate, transfuse  Objective: Vital signs in last 24 hours: Temp:  [97.4 F (36.3 C)-98.5 F (36.9 C)] 97.4 F (36.3 C) (07/03 0540) Pulse Rate:  [87-104] 104 (07/03 0730) Resp:  [18-20] 20 (07/03 0540) BP: (90-114)/(52-64) 90/63 (07/03 0540) SpO2:  [93 %-100 %] 93 % (07/03 0730) Last BM Date: 12/21/20  Intake/Output from previous day: 07/02 0701 - 07/03 0700 In: 579.1 [I.V.:579.1] Out: 400 [Emesis/NG output:400] Intake/Output this shift: No intake/output data recorded.  PE: Gen:  Alert, NAD, pleasant Card:  Reg Pulm: Normal rate and effort Abd: Soft, no significant distension, nontender, no palpable hernia. Midline wound c/d/I. + BS.  Neuro: A&Ox4  Skin: no rashes noted, warm and dry  Lab Results:  Recent Labs    12/20/20 0428 12/22/20 0331  WBC 8.1 9.5  HGB 9.0* 6.5*  HCT 30.2* 22.6*  PLT 165 241   BMET Recent Labs    12/21/20 0516 12/22/20 0331  NA 139 138  K 3.2* 4.3  CL 98 100  CO2 33* 34*  GLUCOSE 119* 137*  BUN 30* 41*  CREATININE 0.92 0.90  CALCIUM 8.3* 8.1*   PT/INR No results for input(s): LABPROT, INR in the last 72 hours. CMP     Component Value Date/Time   NA 138 12/22/2020 0331   K 4.3 12/22/2020 0331   CL 100 12/22/2020 0331   CO2 34 (H) 12/22/2020 0331   GLUCOSE 137 (H) 12/22/2020 0331   BUN 41 (H) 12/22/2020 0331   CREATININE 0.90 12/22/2020 0331   CALCIUM 8.1 (L) 12/22/2020 0331   PROT 4.3 (L) 12/22/2020 0331   ALBUMIN 2.1 (L) 12/22/2020 0331   AST 16 12/22/2020 0331   ALT 12 12/22/2020 0331   ALKPHOS 47 12/22/2020 0331   BILITOT 0.5 12/22/2020 0331   GFRNONAA >60 12/22/2020 0331   GFRAA >60 03/17/2020 1624   Lipase     Component Value Date/Time   LIPASE  23 01/07/2021 0004       Studies/Results: DG Chest 1 View  Result Date: 12/20/2020 CLINICAL DATA:  Post RIGHT thoracentesis with removal of 400 mL fluid EXAM: CHEST  1 VIEW COMPARISON:  CT 12/20/2020, radiograph 12/19/2020 FINDINGS: Interval decrease in the amount of gradient opacity in the right lung base with small residual layering effusion tracking into the fissure. Small apical pneumothorax. Overall appearance of a left pleural effusion is grossly similar to comparison radiography. Some additional areas of scarring and architectural distortion noted towards the bilateral lung apices, right greater than left. Portions of the cardiac silhouette are obscured by overlying opacity. Visible portions are unchanged prior. Chest wall deformity compatible with pectus excavatum seen on comparison. No acute abnormality of the chest wall. Telemetry leads overlie the chest. IMPRESSION: Post right thoracentesis with decrease in the amount of failing gradient opacity in the right lung base compatible with a diminished right pleural effusion. Small layering effusion remains with adjacent passive atelectasis. Small right apical pneumothorax. Stable appearance of a left pleural effusion.  No left pneumothorax. No other significant interval changes. These results will be called to the ordering clinician or representative by the Radiologist Assistant, and communication documented in the PACS or [employer]. Electronically Signed   By: Coralie Keens.D.  On: 12/20/2020 17:22   CT CHEST W CONTRAST  Result Date: 12/20/2020 CLINICAL DATA:  Respiratory failure EXAM: CT CHEST WITH CONTRAST TECHNIQUE: Multidetector CT imaging of the chest was performed during intravenous contrast administration. CONTRAST:  46mL OMNIPAQUE IOHEXOL 300 MG/ML  SOLN COMPARISON:  Chest radiograph 12/19/2020 and CT a chest 12/29/2014 FINDINGS: Cardiovascular: Atherosclerotic calcification of the thoracic aorta and branch vessels. Mild  cardiomegaly. Mediastinum/Nodes: No overtly pathologic adenopathy is identified. Dilated upper thoracic esophagus with air-fluid level, the lower thoracic esophagus is somewhat displaced to the left possibly as result of the pectus excavatum. Lungs/Pleura: Large bilateral pleural effusions (nonspecific for transudative versus exudative etiology with passive atelectasis including complete atelectasis of both lower lobes. Biapical pleuroparenchymal scarring. Upper Abdomen: Ascites. Musculoskeletal: Pectus excavatum. Severe thoracic kyphosis. Substantial levoconvex upper thoracic and dextroconvex lower thoracic scoliosis. Marked paucity of adipose tissue. IMPRESSION: 1. Large bilateral pleural effusions with associated passive atelectasis. 2. Ascites. 3. Thoracic kyphosis with substantial thoracic scoliosis. 4. Pectus excavatum. 5.  Aortic Atherosclerosis (ICD10-I70.0).  Mild cardiomegaly. 6. Dilated upper thoracic esophagus with air-fluid level, and leftward deviation of the lower thoracic esophagus possibly partially related to the pectus excavatum. Electronically Signed   By: Gaylyn Rong M.D.   On: 12/20/2020 15:27   DG Abd Portable 1V  Result Date: 12/20/2020 CLINICAL DATA:  NG tube placement EXAM: PORTABLE ABDOMEN - 1 VIEW COMPARISON:  12/20/2020, CT 01/06/2021 FINDINGS: Esophageal tube tip has been placed, somewhat low lying tip at the right lower quadrant/pelvis. Residual air distension of central small bowel. Contrast in the bladder. IMPRESSION: 1. Esophageal tube tip somewhat low lying in appearance and projects at the level of right pelvis. Tip could potentially be within second portion of duodenum 2. Air distension of central small bowel consistent with history of bowel obstruction Electronically Signed   By: Jasmine Pang M.D.   On: 12/20/2020 21:42   DG Abd Portable 1V  Result Date: 12/20/2020 CLINICAL DATA:  Abdominal pain and distension with bloating EXAM: PORTABLE ABDOMEN - 1 VIEW  COMPARISON:  Multiple exams, including 06-Jan-2021 FINDINGS: The nasogastric tube is no longer visualized. There is substantial gaseous distention of the stomach. This displaces loops of bowel in the abdomen. Stable lines noted in the right lower quadrant. Aortoiliac atherosclerotic vascular disease. Bony demineralization. Levoconvex lumbar scoliosis with rotary component. There is also leftward rotation of the patient on today's exam. Left upper quadrant calcification corresponding to known left renal calculus. IMPRESSION: 1. Gaseous distention of the stomach. 2. Stable appearance of left renal calculus. 3.  Aortic Atherosclerosis (ICD10-I70.0). 4. Levoconvex lumbar scoliosis with rotary component. 5. Bony demineralization. Electronically Signed   By: Gaylyn Rong M.D.   On: 12/20/2020 15:19   [place] EKG SITE RITE  Result Date: 12/21/2020 If Site Rite image not attached, placement could not be confirmed due to current cardiac rhythm.  US THORACENTESIS ASP PLEURAL SPACE W/IMG GUIDE  Result Date: 12/20/2020 INDICATION: Shortness of breath. Hypoxia. Bilateral pleural effusion, right greater than left. Request for therapeutic thoracentesis. EXAM: ULTRASOUND GUIDED RIGHT THORACENTESIS MEDICATIONS: 1% plain lidocaine, 2 mL COMPLICATIONS: SIR Level A - No therapy, no consequence. Postprocedural chest radiograph demonstrates development of a tiny right apical presumably ex vacuo pneumothorax. PROCEDURE: An ultrasound guided thoracentesis was thoroughly discussed with the patient and questions answered. The benefits, risks, alternatives and complications were also discussed. The patient understands and wishes to proceed with the procedure. Written consent was obtained. Ultrasound was performed to localize and mark an adequate pocket of fluid in  the right chest. The area was then prepped and draped in the normal sterile fashion. 1% Lidocaine was used for local anesthesia. Under ultrasound guidance a 6 Fr  Safe-T-Centesis catheter was introduced. Thoracentesis was performed. The catheter was removed and a dressing applied. FINDINGS: A total of approximately 400 mL of clear yellow fluid was removed. IMPRESSION: Successful ultrasound guided right thoracentesis yielding 400 mL of pleural fluid complicated by development of a tiny right apical presumably ex vacuo pneumothorax. Read by: Brayton El PA-C Electronically Signed   By: Simonne Come M.D.   On: 12/20/2020 17:25    Anti-infectives: Anti-infectives (From admission, onward)    Start     Dose/Rate Route Frequency Ordered Stop   12/17/20 0015  cefoTEtan (CEFOTAN) 2 g in sodium chloride 0.9 % 100 mL IVPB        2 g 200 mL/hr over 30 Minutes Intravenous Every 12 hours 12/16/20 2316 12/17/20 0254   12/16/20 1100  cefTRIAXone (ROCEPHIN) 2 g in sodium chloride 0.9 % 100 mL IVPB        2 g 200 mL/hr over 30 Minutes Intravenous Daily 12/16/20 1010 12/20/20 0853   12/05/2020 1000  cefTRIAXone (ROCEPHIN) 2 g in sodium chloride 0.9 % 100 mL IVPB       See Hyperspace for full Linked Orders Report.   2 g 200 mL/hr over 30 Minutes Intravenous On call to O.R. 12/03/2020 0905 11/29/2020 1045   11/25/2020 1000  metroNIDAZOLE (FLAGYL) IVPB 500 mg  Status:  Discontinued       See Hyperspace for full Linked Orders Report.   500 mg 100 mL/hr over 60 Minutes Intravenous Every 8 hours 12/06/2020 0905 12/20/20 1320        Assessment/Plan POD 7 - s/p proximal colectomy with ileal resection, LOA for ileocolonic volvulus with closed-loop obstruction and necrosis - Dr. Michaell Cowing - 6/26 -Continue clears, startTPN - Mobilize. PT rec SNF - Pulm toilet  FEN -adv to full liquids (ordered) VTE - SCDs, Lovenox ID - Rocephin/Flagyl 6/26 - 7/1 Foley - out Follow-up - Dr. Michaell Cowing   Confusion - CTH negative. Improved. Avoid narcotics. Family has decided to make her DNR/DNI per TRH note. ABL anemia -hemoglobin 6.5; would recheck, transfuse if accurate Hypoxia - elevated bnp. Per trh   HTN AKI w/ CKD3 - Cr now normalized   LOS: 7 days    Andria Meuse MD [facility] 12/22/2020, 9:06 AM Please see Amion for pager number during day hours 7:00am-4:30pm

## 2020-12-22 NOTE — Progress Notes (Signed)
PHARMACY - TOTAL PARENTERAL NUTRITION CONSULT NOTE   Indication: Prolonged ileus  Patient Measurements: Height: 5' (152.4 cm) Weight: 47 kg (103 lb 9.9 oz) IBW/kg (Calculated) : 45.5 TPN AdjBW (KG): 35.6 Body mass index is 20.24 kg/m.  Assessment:  Pt is an 45 yoF initially presenting with abdominal pain, N/V. Found to have high grade bowel obstruction; failed to improve with NG decompression. Pt underwent proximal colectomy with ileal resection and lysis of adhesions on 6/26 for necrotic volvulus of ileum and colon. Pharmacy consulted to start TPN on 7/1 (POD#6).  Glucose / Insulin: No hx DM. BG goal <180.  -CBG range: 135-178. 3 units SSI used Electrolytes: K (3.2), Ca (8.3) both slightly low; all other lytes WNL Renal: SCr 0.9 - stable, BUN 41 (increasing) Hepatic: LFTs WNL Intake / Output; MIVF: Strict I/O not charted -UOP: 2 unmeasured; NG: 400 mL (removed 7/2); Stool unmeasured x2 -No MIVF. Furosemide 20 mg IV q12h GI Imaging: -6/26 CT Abd: High grade SBO, severe bowel loop distortion and narrowing GI Surgeries / Procedures:  -6/26: Proximal colectomy with ileal resection  Central access: Double lumen PICC TPN start date: 7/2  Nutritional Goals (per RD recommendations on 7/3):  kCal: 1400-1600, Protein: 65-80 grams, Fluid: >/= 1.4 L  Goal TPN rate is 60 mL/hr (provides 75 g of protein, 230 g dextrose, and 1429 kcals per day)  Current Nutrition:  TPN, Full liquid (advanced this AM) Ensure Enlive TID   Plan:   Now: Sodium phosphate 10 mmol IV once  At 1800: Continue TPN at 30 mL/hr Provides 37 g protein, 115 g dextrose, 715 kcal Will not advance rate due to hypophosphatemia Electrolytes in TPN: Standard concentrations. No changes Na 50mEq/L K 6mEq/L Ca 37mEq/L Mg 40mEq/L Phos 4mmol/L Cl:Ac 2:1 (change) Add standard MVI and trace elements to TPN Continue Sensitive q6h SSI and adjust as needed  No MIVF ordered; management per MD.  Monitor TPN labs on  Mon/Thurs, recheck electrolytes with AM labs tomorrow  Cindi Carbon, PharmD 12/22/2020,9:48 AM

## 2020-12-23 ENCOUNTER — Inpatient Hospital Stay (HOSPITAL_COMMUNITY): Payer: Medicare Other

## 2020-12-23 LAB — COMPREHENSIVE METABOLIC PANEL
ALT: 13 U/L (ref 0–44)
AST: 17 U/L (ref 15–41)
Albumin: 2.4 g/dL — ABNORMAL LOW (ref 3.5–5.0)
Alkaline Phosphatase: 49 U/L (ref 38–126)
Anion gap: 6 (ref 5–15)
BUN: 56 mg/dL — ABNORMAL HIGH (ref 8–23)
CO2: 32 mmol/L (ref 22–32)
Calcium: 8.2 mg/dL — ABNORMAL LOW (ref 8.9–10.3)
Chloride: 101 mmol/L (ref 98–111)
Creatinine, Ser: 0.89 mg/dL (ref 0.44–1.00)
GFR, Estimated: >60 mL/min (ref >60)
Glucose, Bld: 148 mg/dL — ABNORMAL HIGH (ref 70–99)
Potassium: 4.1 mmol/L (ref 3.5–5.1)
Sodium: 139 mmol/L (ref 135–145)
Total Bilirubin: 0.4 mg/dL (ref 0.3–1.2)
Total Protein: 4.8 g/dL — ABNORMAL LOW (ref 6.5–8.1)

## 2020-12-23 LAB — TRIGLYCERIDES: Triglycerides: 107 mg/dL (ref <150)

## 2020-12-23 LAB — GLUCOSE, CAPILLARY
Glucose-Capillary: 102 mg/dL — ABNORMAL HIGH (ref 70–99)
Glucose-Capillary: 137 mg/dL — ABNORMAL HIGH (ref 70–99)
Glucose-Capillary: 153 mg/dL — ABNORMAL HIGH (ref 70–99)
Glucose-Capillary: 154 mg/dL — ABNORMAL HIGH (ref 70–99)
Glucose-Capillary: 158 mg/dL — ABNORMAL HIGH (ref 70–99)

## 2020-12-23 LAB — MAGNESIUM: Magnesium: 1.9 mg/dL (ref 1.7–2.4)

## 2020-12-23 LAB — PHOSPHORUS: Phosphorus: 2.1 mg/dL — ABNORMAL LOW (ref 2.5–4.6)

## 2020-12-23 LAB — CBC
HCT: 27.8 % — ABNORMAL LOW (ref 36.0–46.0)
Hemoglobin: 8.7 g/dL — ABNORMAL LOW (ref 12.0–15.0)
MCH: 29.3 pg (ref 26.0–34.0)
MCHC: 31.3 g/dL (ref 30.0–36.0)
MCV: 93.6 fL (ref 80.0–100.0)
Platelets: 238 10*3/uL (ref 150–400)
RBC: 2.97 MIL/uL — ABNORMAL LOW (ref 3.87–5.11)
RDW: 15.4 % (ref 11.5–15.5)
WBC: 16.4 10*3/uL — ABNORMAL HIGH (ref 4.0–10.5)
nRBC: 1.5 % — ABNORMAL HIGH (ref 0.0–0.2)

## 2020-12-23 LAB — DIFFERENTIAL
Abs Immature Granulocytes: 0.33 10*3/uL — ABNORMAL HIGH (ref 0.00–0.07)
Basophils Absolute: 0 10*3/uL (ref 0.0–0.1)
Basophils Relative: 0 %
Eosinophils Absolute: 0.1 10*3/uL (ref 0.0–0.5)
Eosinophils Relative: 1 %
Immature Granulocytes: 2 %
Lymphocytes Relative: 7 %
Lymphs Abs: 1.1 10*3/uL (ref 0.7–4.0)
Monocytes Absolute: 1.6 10*3/uL — ABNORMAL HIGH (ref 0.1–1.0)
Monocytes Relative: 10 %
Neutro Abs: 13.3 10*3/uL — ABNORMAL HIGH (ref 1.7–7.7)
Neutrophils Relative %: 80 %

## 2020-12-23 LAB — PREALBUMIN: Prealbumin: 13.2 mg/dL — ABNORMAL LOW (ref 18–38)

## 2020-12-23 MED ORDER — MAGNESIUM SULFATE IN D5W 1-5 GM/100ML-% IV SOLN
1.0000 g | Freq: Once | INTRAVENOUS | Status: AC
  Administered 2020-12-23: 1 g via INTRAVENOUS
  Filled 2020-12-23: qty 100

## 2020-12-23 MED ORDER — TRAVASOL 10 % IV SOLN
INTRAVENOUS | Status: AC
  Filled 2020-12-23: qty 561.6

## 2020-12-23 MED ORDER — POTASSIUM PHOSPHATES 15 MMOLE/5ML IV SOLN
10.0000 mmol | Freq: Once | INTRAVENOUS | Status: AC
  Administered 2020-12-23: 10 mmol via INTRAVENOUS
  Filled 2020-12-23: qty 3.33

## 2020-12-23 NOTE — Progress Notes (Signed)
PHARMACY - TOTAL PARENTERAL NUTRITION CONSULT NOTE   Indication: Prolonged ileus  Patient Measurements: Height: 5' (152.4 cm) Weight: 41.9 kg (92 lb 6 oz) IBW/kg (Calculated) : 45.5 TPN AdjBW (KG): 35.6 Body mass index is 18.04 kg/m.  Assessment:  Pt is an 35 yoF initially presenting with abdominal pain, N/V. Found to have high grade bowel obstruction; failed to improve with NG decompression. Pt underwent proximal colectomy with ileal resection and lysis of adhesions on 6/26 for necrotic volvulus of ileum and colon. Pharmacy consulted to start TPN on 7/1 (POD#6).  Glucose / Insulin: No hx DM. BG goal <180.  -CBG range: 102-178. 7 units SSI used Electrolytes: Lytes WNL, except Phos 2.1 (low but increased after NaPhos replacement 7/3), Mag 1.9 (below goal >= 2) - CO2 32 near upper end, Cl 101 - CorrCa 9.48 Renal: SCr 0.89 - stable, BUN 56 (increasing) Hepatic: LFTs WNL Prealbumin: 9.9 > 11.3 > 13.2 (7/4) increasing. Intake / Output; MIVF: Strict I/O not charted -UOP: 3 unmeasured; Stool unmeasured x1 -No MIVF. Furosemide 20 mg IV q12h - NG removed 7/2 GI Imaging: -6/26 CT Abd: High grade SBO, severe bowel loop distortion and narrowing GI Surgeries / Procedures:  -6/26: Proximal colectomy with ileal resection  Central access: Double lumen PICC TPN start date: 7/2  Nutritional Goals (per RD recommendations on 7/3): kCal: 1400-1600, Protein: 65-80 grams, Fluid: >/= 1.4 L  Goal TPN rate is 60 mL/hr (provides 75 g of protein, 230 g dextrose, and 1429 kcals per day)  Current Nutrition:  TPN Advance as tolerated to soft diet; calorie count ordered. Ensure Enlive TID (start 7/3) - pt refused 1/3 administrations yesterday.  Plan:  Now: Potassium phosphate 10 mmol IV once - Magnesium 1g IV once  At 1800: Advance TPN to 45 mL/hr Provides 56 g protein, 115 g dextrose, 1072 kcal Electrolytes in TPN: Standard concentrations. No changes Na 22mEq/L K 45mEq/L Ca 51mEq/L Mg  41mEq/L Phos 30mmol/L Cl:Ac 2:1  Add standard MVI and trace elements to TPN Continue Sensitive q6h SSI and adjust as needed  No MIVF ordered; management per MD.  Monitor TPN labs on Mon/Thurs, recheck electrolytes with AM labs tomorrow  Lynann Beaver PharmD, BCPS Clinical Pharmacist [facility] main pharmacy [phone] 12/23/2020 9:26 AM

## 2020-12-23 NOTE — Progress Notes (Signed)
8 Days Post-Op  Subjective: CC: Doing reasonably well. Continues to have bowel movements and flatus.  Denies abdominal pain or nausea and has taken in some liquids without apparent difficulty.  1U PRBC given yesterday for acute blood loss anemia  Objective: Vital signs in last 24 hours: Temp:  [97.4 F (36.3 C)-97.7 F (36.5 C)] 97.7 F (36.5 C) (07/04 0431) Pulse Rate:  [91-100] 94 (07/04 0431) Resp:  [20-24] 20 (07/04 0431) BP: (85-116)/(48-65) 104/50 (07/04 0431) SpO2:  [94 %-100 %] 96 % (07/04 0431) Weight:  [41.9 kg] 41.9 kg (07/04 0559) Last BM Date: 12/22/20  Intake/Output from previous day: 07/03 0701 - 07/04 0700 In: 1246.3 [I.V.:782.3; Blood:464] Out: -  Intake/Output this shift: No intake/output data recorded.  PE: Gen:  Alert, NAD, pleasant Card:  Reg Pulm: Normal rate and effort Abd: Soft, no significant distension, nontender, no palpable hernia. Midline wound c/d/I. + BS.  Neuro: A&Ox4  Skin: no rashes noted, warm and dry  Lab Results:  Recent Labs    12/22/20 0331 12/22/20 1600 12/23/20 0312  WBC 9.5  --  16.4*  HGB 6.5* 9.1* 8.7*  HCT 22.6* 29.1* 27.8*  PLT 241  --  238   BMET Recent Labs    12/22/20 0331 12/23/20 0312  NA 138 139  K 4.3 4.1  CL 100 101  CO2 34* 32  GLUCOSE 137* 148*  BUN 41* 56*  CREATININE 0.90 0.89  CALCIUM 8.1* 8.2*   PT/INR No results for input(s): LABPROT, INR in the last 72 hours. CMP     Component Value Date/Time   NA 139 12/23/2020 0312   K 4.1 12/23/2020 0312   CL 101 12/23/2020 0312   CO2 32 12/23/2020 0312   GLUCOSE 148 (H) 12/23/2020 0312   BUN 56 (H) 12/23/2020 0312   CREATININE 0.89 12/23/2020 0312   CALCIUM 8.2 (L) 12/23/2020 0312   PROT 4.8 (L) 12/23/2020 0312   ALBUMIN 2.4 (L) 12/23/2020 0312   AST 17 12/23/2020 0312   ALT 13 12/23/2020 0312   ALKPHOS 49 12/23/2020 0312   BILITOT 0.4 12/23/2020 0312   GFRNONAA >60 12/23/2020 0312   GFRAA >60 03/17/2020 1624   Lipase      Component Value Date/Time   LIPASE 23 12/04/2020 0004       Studies/Results: [place] EKG SITE RITE  Result Date: 12/21/2020 If Site Rite image not attached, placement could not be confirmed due to current cardiac rhythm.   Anti-infectives: Anti-infectives (From admission, onward)    Start     Dose/Rate Route Frequency Ordered Stop   12/17/20 0015  cefoTEtan (CEFOTAN) 2 g in sodium chloride 0.9 % 100 mL IVPB        2 g 200 mL/hr over 30 Minutes Intravenous Every 12 hours 12/16/20 2316 12/17/20 0254   12/16/20 1100  cefTRIAXone (ROCEPHIN) 2 g in sodium chloride 0.9 % 100 mL IVPB        2 g 200 mL/hr over 30 Minutes Intravenous Daily 12/16/20 1010 12/20/20 0853   12/14/2020 1000  cefTRIAXone (ROCEPHIN) 2 g in sodium chloride 0.9 % 100 mL IVPB       See Hyperspace for full Linked Orders Report.   2 g 200 mL/hr over 30 Minutes Intravenous On call to O.R. 11/26/2020 0905 11/20/2020 1045   12/17/2020 1000  metroNIDAZOLE (FLAGYL) IVPB 500 mg  Status:  Discontinued       See Hyperspace for full Linked Orders Report.   500 mg 100 mL/hr  over 60 Minutes Intravenous Every 8 hours 12/25/2020 0905 12/20/20 1320        Assessment/Plan POD 8 - s/p proximal colectomy with ileal resection, LOA for ileocolonic volvulus with closed-loop obstruction and necrosis - Dr. Michaell Cowing - 6/26 - Ok to advance as tolerated to soft diet; cont TPN; calorie count - Mobilize. PT rec SNF - Pulm toilet  FEN -adv to full liquids (ordered) VTE - SCDs, Lovenox ID - Rocephin/Flagyl 6/26 - 7/1 Foley - out Follow-up - Dr. Michaell Cowing   Confusion - CTH negative. Improved. Avoid narcotics. Family has decided to make her DNR/DNI per TRH note. ABL anemia - monitor daily Hypoxia - elevated bnp. Per trh  HTN AKI w/ CKD3 - Cr now normalized   LOS: 8 days    Andria Meuse MD [facility] 12/23/2020, 8:36 AM Please see Amion for pager number during day hours 7:00am-4:30pm

## 2020-12-23 NOTE — Progress Notes (Signed)
Physical Therapy Treatment Patient Details Name: AARTI MANKOWSKI MRN: [id number] DOB: [birth year] Today's Date: 12/23/2020    History of Present Illness Pt is an 87 year old female admitted for abdominal pain and distention.  CT abdomen consistent with high-grade small bowel obstruction with transition point in the right lower quadrant.  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 Michaell Cowing - 6/26.  PMHx significant for COPD, smoking, advanced lower cervical degenerative disc disease, HTN, glaucoma, chronic kidney disease    PT Comments    Pt with decreased alertness and appears lethargic on arrival.  Pt assisted to sitting however this did not improve so returned pt to supine and repositioned.  Notified RN who reports pt received benadryl earlier. Continue to recommend SNF upon d/c.  Pt may benefit from palliative care consult; MD please order if agreeable.     Follow Up Recommendations  SNF     Equipment Recommendations  Rolling walker with 5" wheels;3in1 (PT)    Recommendations for Other Services       Precautions / Restrictions Precautions Precautions: Fall Precaution Comments: currently on 4L HFNC, abdominal incision    Mobility  Bed Mobility Overal bed mobility: Needs Assistance Bed Mobility: Supine to Sit;Sit to Supine     Supine to sit: Total assist;+2 for physical assistance Sit to supine: Total assist;+2 for physical assistance   General bed mobility comments: pt not responding to multimodal cues, required total assist, no improvement to alertness or awakened upon sitting so returned to supine and repositioned, SPO2 98% on 4L HFNC    Transfers                 General transfer comment: unable due to lethargy  Ambulation/Gait                 Stairs             Wheelchair Mobility    Modified Rankin (Stroke Patients Only)       Balance                                             Cognition Arousal/Alertness: Lethargic   Overall Cognitive Status: Impaired/Different from baseline                                 General Comments: pt with eyes half open, did not verbalize, assisted pt to sitting and she did not appear any more alert/awake so assisted back to supine and repositionined      Exercises      General Comments        Pertinent Vitals/Pain Pain Assessment: Faces Faces Pain Scale: No hurt Pain Intervention(s): Repositioned    Home Living                      Prior Function            PT Goals (current goals can now be found in the care plan section) Progress towards PT goals: Not progressing toward goals - comment (lethargy)    Frequency    Min 2X/week      PT Plan Current plan remains appropriate    Co-evaluation  AM-PAC PT "6 Clicks" Mobility   Outcome Measure  Help needed turning from your back to your side while in a flat bed without using bedrails?: Total Help needed moving from lying on your back to sitting on the side of a flat bed without using bedrails?: Total Help needed moving to and from a bed to a chair (including a wheelchair)?: Total Help needed standing up from a chair using your arms (e.g., wheelchair or bedside chair)?: Total Help needed to walk in hospital room?: Total Help needed climbing 3-5 steps with a railing? : Total 6 Click Score: 6    End of Session Equipment Utilized During Treatment: Oxygen Activity Tolerance: Patient tolerated treatment well Patient left: with bed alarm set;in bed;with call bell/phone within reach Nurse Communication: Mobility status (decreased alertness/lethargic) PT Visit Diagnosis: Other abnormalities of gait and mobility (R26.89);Muscle weakness (generalized) (M62.81)     Time: 1751-0258 PT Time Calculation (min) (ACUTE ONLY): 9 min  Charges:  $Therapeutic Activity: 8-22 mins                      Thomasene Mohair PT, DPT Acute Rehabilitation Services Pager: [phone] Office: [phone]  Maida Sale E 12/23/2020, 3:40 PM

## 2020-12-23 NOTE — Care Management Important Message (Signed)
Important Message  Patient Details IM Letter given to the Patient. Name: Amber Rowland MRN: [id number] Date of Birth: 1933-09-10   Medicare Important Message Given:  Yes     Caren Macadam 12/23/2020, 11:25 AM

## 2020-12-23 NOTE — Progress Notes (Signed)
PROGRESS NOTE    Amber Rowland  WUJ:[id number] DOB: Nov 06, 1933 DOA: 01-04-21 PCP: Marcine Matar, MD   Brief Narrative:  This 87 year old female with history of small bowel obstruction requiring lysis of adhesions in 2013, malnutrition, hypertension, CKD stage III who presented to the emergency department with complaints of severe abdominal pain, distention, nausea/vomiting.  CT abdomen showed high-grade small bowel obstruction with transition point at the right lower quadrant.  Patient was admitted for SBO, NG tube inserted.  General surgery consulted and following.  Underwent expiratory laparotomy on 6/26.  Hospital course remarkable for lethargy after receiving a dose of Dilaudid, improvement with Narcan.  She was also hypotensive on 12/19/2020 and was moved to progressive unit.  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   CT abdomen showed high-grade small bowel obstruction with transition point at the right lower quadrant.   She underwent expiratory laparotomy on 6/26, s/p proximal colectomy with ileal resection. Continue antiemetics, pain management, IV fluids She was also on IV antibiotics, completed the course of 5 days postop. Currently on Full liquid diet.  She is tolerating well without any pain. General surgery recommended advance  diet as tolerated and continue TPN for calorie count.   Acute hypoxic respiratory failure / COPD:  She does not use oxygen at baseline. She has H/O COPD, not wheezing at present.   Most likely she was volume overloaded. Chest x-ray showed bilateral features of pulmonary edema.     Continue Lasix 20 mg IV BID, She has received a lot of fluids during this hospitalization.  CT chest showed bilateral pleural effusion. Patient underwent Right thoracocentesis,  400 cc of clear fluid drained. Respiratory status improved. Repeat CXR   Hypertension:  She became hypotensive on 6/30.  Antihypertensives on hold.  Monitor blood pressure.   Increase Midodrine to 10 mg TID. BP improved.   Severe malnutrition:  Nutrition team consulted ,  General surgery recommended TPN. Continue TPN for calorie count   CKD stage IIIb:  Currently kidney function at baseline.   Avoid nephrotoxins. Continue to moniter   Normocytic anemia:  Hemoglobin dropped to 6.8, continue monitoring.  No evidence of active bleeding.   Iron studies show severe iron deficiency.  She was given a dose of IV iron infusion.   S/p 1PRBC Hb 9.1>8.7, continue to moniter   Thrombocytopenia: > Resolved.   Acute encephalopathy/lethargy: > Resolved. Patient is very sensitive to opioids. She became lethargic on 12/17/2020 after receiving a dose of Dilaudid.   Symptoms improved with Narcan.  Avoid opiates as much as possible.   CT head did not show any acute intracranial abnormalities, showed chronic small vessel disease.   She is more alert and awake and following commands.  Goals of care/debility/deconditioning:  DNR.  PT/OT evaluation done, recommended skilled nursing facility on discharge.TOC following    DVT prophylaxis:  SCDs Code Status: DNR Family Communication: No family at bed side. Disposition Plan:   Status is: Inpatient  Remains inpatient appropriate because:Inpatient level of care appropriate due to severity of  illness  Dispo: The patient is from: SNF              Anticipated d/c is to: SNF              Patient currently is not medically stable to d/c.   Difficult to place patient No  Consultants:  General Surgery  Procedures:  Exploratory Lapratomy  Antimicrobials:   Anti-infectives (From admission, onward)    Start     Dose/Rate Route Frequency Ordered Stop   12/17/20 0015  cefoTEtan (CEFOTAN) 2 g in sodium chloride 0.9 % 100 mL IVPB        2 g 200 mL/hr over 30 Minutes Intravenous Every 12 hours 12/16/20 2316 12/17/20 0254   12/16/20 1100  cefTRIAXone (ROCEPHIN) 2 g in sodium chloride 0.9 % 100 mL IVPB        2 g 200 mL/hr over 30 Minutes Intravenous Daily 12/16/20 1010 12/20/20 0853   12/14/2020 1000  cefTRIAXone (ROCEPHIN) 2 g in sodium chloride 0.9 % 100 mL IVPB       See Hyperspace for full Linked Orders Report.   2 g 200 mL/hr over 30 Minutes Intravenous On call to O.R. 12/16/2020 0905 11/20/2020 1045   12/16/2020 1000  metroNIDAZOLE (FLAGYL) IVPB 500 mg  Status:  Discontinued       See Hyperspace for full Linked Orders Report.   500 mg 100 mL/hr over 60 Minutes Intravenous Every 8 hours 12/01/2020 0905 12/20/20 1320        Subjective: Patient was seen and examined at bedside.  Overnight events noted.  She reports feeling better. Patient is very alert and oriented, seems at her baseline.  She was started on TPN, tolerating well.  She denies N/V/D.  She is passing flatus and having bowel movement.   Objective: Vitals:   12/22/20 1315 12/22/20 2038 12/23/20 0431 12/23/20 0559  BP: 116/65 (!) 107/58 (!) 104/50   Pulse: 96 91 94   Resp: (!) 22 20 20    Temp: 97.7 F (36.5 C) (!) 97.4 F (36.3 C) 97.7 F (36.5 C)   TempSrc: Oral Oral    SpO2: 97% 94% 96%   Weight:    41.9 kg  Height:        Intake/Output Summary (Last 24 hours) at 12/23/2020 1004 Last data filed at 12/23/2020 0200 Gross per 24 hour  Intake 1246.34 ml  Output --  Net 1246.34 ml   Filed Weights   12/20/20  0500 12/21/20 0436 12/23/20 0559  Weight: 43.1 kg 47 kg 41.9 kg    Examination:  General exam: Appears calm and comfortable, not in any acute distress. Deconditioned. Respiratory system: Clear to auscultation. Respiratory effort normal. RR 17 Cardiovascular system: S1 & S2 heard, RRR. No JVD, murmurs, rubs, gallops or clicks. No pedal edema. Gastrointestinal system: Abdomen is distended, soft and nontender. No organomegaly or masses felt. Normal bowel sounds heard.  Midline surgical scar noted. Central nervous system: Alert and oriented. No focal neurological deficits. Extremities: Symmetric 5 x 5 power.  No edema, no cyanosis, no clubbing. Skin: No rashes, lesions or ulcers Psychiatry: Judgement and insight appear normal. Mood & affect appropriate.     Data Reviewed: I have personally reviewed following labs and imaging studies  CBC: Recent Labs  Lab 12/18/20 0417 12/19/20 0417 12/20/20  [id number] 12/22/20 0331 12/22/20 1600 12/23/20 0312  WBC 7.8 7.8 8.1 9.5  --  16.4*  NEUTROABS  --  6.9  --  7.8*  --  13.3*  HGB 8.8* 9.7* 9.0* 6.5* 9.1* 8.7*  HCT 30.3* 31.7* 30.2* 22.6* 29.1* 27.8*  MCV 100.0 95.5 98.4 100.4*  --  93.6  PLT 140* 151 165 241  --  238   Basic Metabolic Panel: Recent Labs  Lab 12/18/20 0417 12/19/20 0417 12/20/20 0428 12/21/20 0516 12/21/20 1136 12/22/20 0331 12/23/20 0312  NA 139 139 139 139  --  138 139  K 4.3 3.6 3.6 3.2*  --  4.3 4.1  CL 101 103 102 98  --  100 101  CO2 29 29 31  33*  --  34* 32  GLUCOSE 119* 112* 137* 119*  --  137* 148*  BUN 44* 39* 32* 30*  --  41* 56*  CREATININE 1.52* 1.10* 0.92 0.92  --  0.90 0.89  CALCIUM 8.4* 8.4* 8.3* 8.3*  --  8.1* 8.2*  MG 2.5*  --   --   --  1.9 2.0 1.9  PHOS 3.5  --   --   --  2.6 1.8* 2.1*   GFR: Estimated Creatinine Clearance: 30.6 mL/min (by C-G formula based on SCr of 0.89 mg/dL). Liver Function Tests: Recent Labs  Lab 12/22/20 0331 12/23/20 0312  AST 16 17  ALT 12 13  ALKPHOS 47 49   BILITOT 0.5 0.4  PROT 4.3* 4.8*  ALBUMIN 2.1* 2.4*   No results for input(s): LIPASE, AMYLASE in the last 168 hours.  No results for input(s): AMMONIA in the last 168 hours. Coagulation Profile: No results for input(s): INR, PROTIME in the last 168 hours. Cardiac Enzymes: No results for input(s): CKTOTAL, CKMB, CKMBINDEX, TROPONINI in the last 168 hours. BNP (last 3 results) No results for input(s): PROBNP in the last 8760 hours. HbA1C: No results for input(s): HGBA1C in the last 72 hours. CBG: Recent Labs  Lab 12/22/20 1204 12/22/20 1618 12/23/20 0023 12/23/20 0553 12/23/20 0740  GLUCAP 158* 134* 153* 154* 102*   Lipid Profile: Recent Labs    12/22/20 0331 12/23/20 0312  TRIG 68 107   Thyroid Function Tests: No results for input(s): TSH, T4TOTAL, FREET4, T3FREE, THYROIDAB in the last 72 hours. Anemia Panel: No results for input(s): VITAMINB12, FOLATE, FERRITIN, TIBC, IRON, RETICCTPCT in the last 72 hours. Sepsis Labs: No results for input(s): PROCALCITON, LATICACIDVEN in the last 168 hours.   Recent Results (from the past 240 hour(s))  Resp Panel by RT-PCR (Flu A&B, Covid) Nasopharyngeal Swab     Status: None   Collection Time: 2021-01-02  5:28 AM   Specimen: Nasopharyngeal Swab; Nasopharyngeal(NP) swabs in vial transport medium  Result Value Ref Range Status   SARS Coronavirus 2 by RT PCR NEGATIVE NEGATIVE Final    Comment: (NOTE) SARS-CoV-2 target nucleic acids are NOT DETECTED.  The SARS-CoV-2 RNA is generally detectable in upper respiratory specimens during the acute phase of infection. The lowest concentration of SARS-CoV-2 viral copies this assay can detect is 138 copies/mL. A negative result does not preclude SARS-Cov-2 infection and should not be used as the sole basis for treatment or other patient management decisions. A negative result may occur with  improper specimen collection/handling, submission of specimen other than nasopharyngeal swab,  presence of viral mutation(s) within the areas targeted by this assay, and inadequate number of viral copies(<138 copies/mL). A negative result must be combined with clinical observations, patient  history, and epidemiological information. The expected result is Negative.  Fact Sheet for Patients:  BloggerCourse.com  Fact Sheet for Healthcare Providers:  SeriousBroker.it  This test is no t yet approved or cleared by the [place] FDA and  has been authorized for detection and/or diagnosis of SARS-CoV-2 by FDA under an Emergency Use Authorization (EUA). This EUA will remain  in effect (meaning this test can be used) for the duration of the COVID-19 declaration under Section 564(b)(1) of the Act, 21 U.S.C.section 360bbb-3(b)(1), unless the authorization is terminated  or revoked sooner.       Influenza A by PCR NEGATIVE NEGATIVE Final   Influenza B by PCR NEGATIVE NEGATIVE Final    Comment: (NOTE) The Xpert Xpress SARS-CoV-2/FLU/RSV plus assay is intended as an aid in the diagnosis of influenza from Nasopharyngeal swab specimens and should not be used as a sole basis for treatment. Nasal washings and aspirates are unacceptable for Xpert Xpress SARS-CoV-2/FLU/RSV testing.  Fact Sheet for Patients: BloggerCourse.com  Fact Sheet for Healthcare Providers: SeriousBroker.it  This test is not yet approved or cleared by the [place] FDA and has been authorized for detection and/or diagnosis of SARS-CoV-2 by FDA under an Emergency Use Authorization (EUA). This EUA will remain in effect (meaning this test can be used) for the duration of the COVID-19 declaration under Section 564(b)(1) of the Act, 21 U.S.C. section 360bbb-3(b)(1), unless the authorization is terminated or revoked.  Performed at [facility], [address], [place], [place] [address]    Surgical pcr screen     Status: None   Collection Time: Dec 22, 2020  8:54 AM   Specimen: Nasal Mucosa; Nasal Swab  Result Value Ref Range Status   MRSA, PCR NEGATIVE NEGATIVE Final   Staphylococcus aureus NEGATIVE NEGATIVE Final    Comment: (NOTE) The Xpert SA Assay (FDA approved for NASAL specimens in patients 30 years of age and older), is one component of a comprehensive surveillance program. It is not intended to diagnose infection nor to guide or monitor treatment. Performed at [facility], [address], [place], [place] [address]     Radiology Studies: [place] EKG SITE RITE  Result Date: 12/21/2020 If Site Rite image not attached, placement could not be confirmed due to current cardiac rhythm.    Scheduled Meds:  sodium chloride   Intravenous Once   bisacodyl  10 mg Rectal Daily   brimonidine  1 drop Both Eyes BID   Chlorhexidine Gluconate Cloth  6 each Topical Daily   dorzolamide  1 drop Both Eyes BID   feeding supplement  237 mL Oral TID BM   furosemide  20 mg Intravenous Q12H   insulin aspart  0-9 Units Subcutaneous Q6H   lip balm  1 application Topical BID   midodrine  10 mg Oral TID WC   Continuous Infusions:  sodium chloride 250 mL (12/21/20 1543)   ondansetron (ZOFRAN) IV     TPN ADULT (ION) 30 mL/hr at 12/22/20 1744     LOS: 8 days    Time spent: 25 mins    Raschelle Wisenbaker, MD [facility]   If 7PM-7AM, please contact night-coverage

## 2020-12-23 NOTE — Progress Notes (Addendum)
Patient attempted to void however was unable to. Pt also complains of bladder fullness. Bladder scan completed 257 cc. Will update provider. V.O received to in/out cath if bladder scan volume 350 cc or greater.

## 2020-12-24 LAB — BASIC METABOLIC PANEL
Anion gap: 4 — ABNORMAL LOW (ref 5–15)
BUN: 46 mg/dL — ABNORMAL HIGH (ref 8–23)
CO2: 33 mmol/L — ABNORMAL HIGH (ref 22–32)
Calcium: 8.5 mg/dL — ABNORMAL LOW (ref 8.9–10.3)
Chloride: 101 mmol/L (ref 98–111)
Creatinine, Ser: 0.81 mg/dL (ref 0.44–1.00)
GFR, Estimated: >60 mL/min (ref >60)
Glucose, Bld: 122 mg/dL — ABNORMAL HIGH (ref 70–99)
Potassium: 4.7 mmol/L (ref 3.5–5.1)
Sodium: 138 mmol/L (ref 135–145)

## 2020-12-24 LAB — GLUCOSE, CAPILLARY
Glucose-Capillary: 130 mg/dL — ABNORMAL HIGH (ref 70–99)
Glucose-Capillary: 147 mg/dL — ABNORMAL HIGH (ref 70–99)
Glucose-Capillary: 152 mg/dL — ABNORMAL HIGH (ref 70–99)
Glucose-Capillary: 153 mg/dL — ABNORMAL HIGH (ref 70–99)

## 2020-12-24 LAB — PHOSPHORUS: Phosphorus: 3.3 mg/dL (ref 2.5–4.6)

## 2020-12-24 LAB — MAGNESIUM: Magnesium: 2 mg/dL (ref 1.7–2.4)

## 2020-12-24 MED ORDER — CHROMIC CHLORIDE 40 MCG/10ML IV SOLN
INTRAVENOUS | Status: DC
Start: 2020-12-24 — End: 2020-12-25
  Filled 2020-12-24: qty 748.8

## 2020-12-24 MED ORDER — FUROSEMIDE 10 MG/ML IJ SOLN
INTRAMUSCULAR | Status: AC
  Administered 2020-12-24: 40 mg
  Filled 2020-12-24: qty 4

## 2020-12-24 MED ORDER — METHYLPREDNISOLONE SODIUM SUCC 125 MG IJ SOLR
INTRAMUSCULAR | Status: AC
  Administered 2020-12-24: 60 mg
  Filled 2020-12-24: qty 2

## 2020-12-24 MED ORDER — MIDODRINE HCL 5 MG PO TABS
5.0000 mg | ORAL_TABLET | Freq: Three times a day (TID) | ORAL | Status: DC
  Administered 2020-12-24 – 2020-12-25 (×4): 5 mg via ORAL
  Filled 2020-12-24 (×4): qty 1

## 2020-12-24 MED ORDER — ENOXAPARIN SODIUM 30 MG/0.3ML IJ SOSY
30.0000 mg | PREFILLED_SYRINGE | INTRAMUSCULAR | Status: DC
  Administered 2020-12-24 – 2020-12-25 (×2): 30 mg via SUBCUTANEOUS
  Filled 2020-12-24 (×2): qty 0.3

## 2020-12-24 NOTE — Progress Notes (Signed)
Progress Note  9 Days Post-Op  Subjective: Patient seen earlier this AM, she was resting in bed. She reported some abdominal soreness at times. She reported some nausea with PO intake but had drunk some of an ensure. Having bowel function.   Objective: Vital signs in last 24 hours: Temp:  [97.5 F (36.4 C)-98.4 F (36.9 C)] 97.7 F (36.5 C) (07/05 1336) Pulse Rate:  [73-113] 73 (07/05 1336) Resp:  [18-40] 25 (07/05 1336) BP: (94-115)/(51-68) 94/63 (07/05 1336) SpO2:  [92 %-97 %] 93 % (07/05 1336) Weight:  [42 kg] 42 kg (07/05 0405) Last BM Date: 12/23/20  Intake/Output from previous day: 07/04 0701 - 07/05 0700 In: 583.4 [I.V.:419.9; IV Piggyback:163.6] Out: 950 [Urine:950] Intake/Output this shift: No intake/output data recorded.  PE: General: pleasant, WD, elderly female who is laying in bed in NAD Heart: regular, rate, and rhythm.   Lungs: CTAB, no wheezes, rhonchi, or rales noted.  Respiratory effort nonlabored Abd: soft, appropriately ttp, mildly distended, incision c/d/I, +BS    Lab Results:  Recent Labs    12/22/20 0331 12/22/20 1600 12/23/20 0312  WBC 9.5  --  16.4*  HGB 6.5* 9.1* 8.7*  HCT 22.6* 29.1* 27.8*  PLT 241  --  238   BMET Recent Labs    12/23/20 0312 12/24/20 0150  NA 139 138  K 4.1 4.7  CL 101 101  CO2 32 33*  GLUCOSE 148* 122*  BUN 56* 46*  CREATININE 0.89 0.81  CALCIUM 8.2* 8.5*   PT/INR No results for input(s): LABPROT, INR in the last 72 hours. CMP     Component Value Date/Time   NA 138 12/24/2020 0150   K 4.7 12/24/2020 0150   CL 101 12/24/2020 0150   CO2 33 (H) 12/24/2020 0150   GLUCOSE 122 (H) 12/24/2020 0150   BUN 46 (H) 12/24/2020 0150   CREATININE 0.81 12/24/2020 0150   CALCIUM 8.5 (L) 12/24/2020 0150   PROT 4.8 (L) 12/23/2020 0312   ALBUMIN 2.4 (L) 12/23/2020 0312   AST 17 12/23/2020 0312   ALT 13 12/23/2020 0312   ALKPHOS 49 12/23/2020 0312   BILITOT 0.4 12/23/2020 0312   GFRNONAA >60 12/24/2020 0150    GFRAA >60 03/17/2020 1624   Lipase     Component Value Date/Time   LIPASE 23 01/04/21 0004       Studies/Results: DG CHEST PORT 1 VIEW  Result Date: 12/23/2020 CLINICAL DATA:  History of pneumonia and abdominal pain.  Nonverbal. EXAM: PORTABLE CHEST 1 VIEW COMPARISON:  12/20/2020 FINDINGS: The heart is enlarged, partially obscured by a bilateral pulmonary opacities. There has been reaccumulation of veiling density in the RIGHT lung consistent with increased pleural effusion. Interval placement of RIGHT-sided PICC line, tip overlying the superior vena cava. No definite pneumothorax identified. Patient is rotated towards the RIGHT, partially obscuring the RIGHT lung apex. Density in the LEFT lung is consistent atelectasis or infiltrate and pleural effusion and appear stable. IMPRESSION: 1. Interval placement of RIGHT-sided PICC line. 2. Increased RIGHT pleural effusion and RIGHT basilar opacity. Electronically Signed   By: Norva Pavlov M.D.   On: 12/23/2020 11:56    Anti-infectives: Anti-infectives (From admission, onward)    Start     Dose/Rate Route Frequency Ordered Stop   12/17/20 0015  cefoTEtan (CEFOTAN) 2 g in sodium chloride 0.9 % 100 mL IVPB        2 g 200 mL/hr over 30 Minutes Intravenous Every 12 hours 12/16/20 2316 12/17/20 0254   12/16/20  1100  cefTRIAXone (ROCEPHIN) 2 g in sodium chloride 0.9 % 100 mL IVPB        2 g 200 mL/hr over 30 Minutes Intravenous Daily 12/16/20 1010 12/20/20 0853   12-28-2020 1000  cefTRIAXone (ROCEPHIN) 2 g in sodium chloride 0.9 % 100 mL IVPB       See Hyperspace for full Linked Orders Report.   2 g 200 mL/hr over 30 Minutes Intravenous On call to O.R. December 28, 2020 0905 12/28/2020 1045   12-28-2020 1000  metroNIDAZOLE (FLAGYL) IVPB 500 mg  Status:  Discontinued       See Hyperspace for full Linked Orders Report.   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 Michaell Cowing - 6/26 - FLD, TPN, supplements, consider stopping TPN tomorrow if taking more PO - Mobilize. PT rec SNF - Pulm toilet   FEN - FLD VTE - SCDs, Lovenox ID - Rocephin/Flagyl 6/26 - 7/1 Foley - out Follow-up - Dr. Michaell Cowing   Confusion - CTH negative. Improved. Avoid narcotics. Family has decided to make her DNR/DNI per TRH note. ABL anemia - monitor daily Hypoxia - elevated bnp. Per trh HTN AKI w/ CKD3 - Cr now normalized  LOS: 9 days    Juliet Rude, [facility] Surgery 12/24/2020, 1:58 PM Please see Amion for pager number during day hours 7:00am-4:30pm

## 2020-12-24 NOTE — Progress Notes (Signed)
PROGRESS NOTE    Amber Rowland  WUJ:[id number] DOB: 1934-04-18 DOA: 11/29/2020 PCP: Marcine Matar, MD   Brief Narrative:  This 87 year old female with history of small bowel obstruction requiring lysis of adhesions in 2013, malnutrition, hypertension, CKD stage III who presented to the emergency department with complaints of severe abdominal pain, distention, nausea/vomiting.  CT abdomen showed high-grade small bowel obstruction with transition point at the right lower quadrant.  Patient was admitted for SBO, NG tube inserted.  General surgery consulted and following.  Underwent expiratory laparotomy on 6/26.  Hospital course remarkable for lethargy after receiving a dose of Dilaudid, improvement with Narcan.  She was also hypotensive on 12/19/2020 and was moved to progressive unit.  She is started on midodrine and blood pressure has improved.  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   CT abdomen showed high-grade small bowel obstruction with transition point at the right lower quadrant.   She underwent exploratory laparotomy on 6/26, s/p proximal colectomy with ileal resection. Continue antiemetics, pain management. She was also on IV antibiotics, completed the course of 5 days postop. Currently on Full liquid diet.  She is tolerating well  without any pain. She is able to pass flatus and has bowel movements. General surgery recommended to advance diet as tolerated and continue TPN for calorie count.   Acute hypoxic respiratory failure / COPD:  She does not use oxygen at baseline. She has H/O COPD, not wheezing at present.   Most likely she was volume overloaded. Chest x-ray showed bilateral features of pulmonary edema.     Continue Lasix 20 mg IV BID, She has received a lot of fluids during this hospitalization.  CT chest showed bilateral pleural effusion. Patient underwent Right thoracocentesis,  400 cc of clear fluid drained. Respiratory status improved. Repeat CXR showed increase in pleural effusion.   Hypertension:  She became hypotensive on 6/30.  Antihypertensives on hold.   She is started on midodrine 5 mgTID, increased to 10 mg TID , BP improved. Reduce midodrine to 5 mg TID since she developed tachycardia. Monitor blood pressure.    Severe malnutrition:  Nutrition team consulted ,  General surgery recommended TPN. Continue TPN for calorie count   CKD stage IIIb:  Currently kidney function at baseline.   Avoid nephrotoxins. Continue to moniter   Normocytic anemia:  Hemoglobin dropped to 6.8, continue monitoring.  No evidence of active bleeding.   Iron studies show severe iron deficiency.  She was given a dose of IV iron infusion.   S/p 1PRBC Hb 9.1>8.7, continue to moniter   Thrombocytopenia: > Resolved.   Acute encephalopathy/lethargy: > Resolved. Patient is very sensitive to opioids. She became lethargic on 12/17/2020 after receiving a dose of Dilaudid.   Symptoms improved with Narcan.  Avoid opiates as much as possible.   CT head did not show  any acute intracranial abnormalities, showed chronic small vessel disease.   She is more alert and awake and following commands.  Goals of care/debility/deconditioning:  DNR.  PT/OT evaluation done, recommended skilled nursing facility on discharge.TOC following     DVT prophylaxis: SCDs / Lovenox. Code Status: DNR Family Communication: No family at bed side. Disposition Plan:   Status is: Inpatient  Remains inpatient appropriate because:Inpatient level of care appropriate due to severity of illness  Dispo: The patient is from: SNF              Anticipated d/c is to: SNF              Patient currently is not medically stable to d/c.   Difficult to place patient No  Consultants:  General Surgery  Procedures:  Exploratory Lapratomy  Antimicrobials:   Anti-infectives (From admission, onward)    Start     Dose/Rate Route Frequency Ordered Stop   12/17/20 0015  cefoTEtan (CEFOTAN) 2 g in sodium chloride 0.9 % 100 mL IVPB        2 g 200 mL/hr over 30 Minutes Intravenous Every 12 hours 12/16/20 2316 12/17/20 0254   12/16/20 1100  cefTRIAXone (ROCEPHIN) 2 g in sodium chloride 0.9 % 100 mL IVPB        2 g 200 mL/hr over 30 Minutes Intravenous Daily 12/16/20 1010 12/20/20 0853   31-Dec-2020 1000  cefTRIAXone (ROCEPHIN) 2 g in sodium chloride 0.9 % 100 mL IVPB       See Hyperspace for full Linked Orders Report.   2 g 200 mL/hr over 30 Minutes Intravenous On call to O.R. 2020-12-31 0905 12/31/2020 1045   2020/12/31 1000  metroNIDAZOLE (FLAGYL) IVPB 500 mg  Status:  Discontinued       See Hyperspace for full Linked Orders Report.   500 mg 100 mL/hr over 60 Minutes Intravenous Every 8 hours 12-31-2020 0905 12/20/20 1320        Subjective: Patient was seen and examined at bedside.  Overnight events noted. She was sitting in the chair, reports feeling better. Patient seems at her baseline. She is started on TPN, tolerating well.  She reports pain is better controlled. She is passing flatus and having bowel movement.  She denies any nausea, vomiting and diarrhea.     Objective: Vitals:   12/23/20 2028 12/24/20 0021 12/24/20 0318 12/24/20 0405  BP: (!) 115/55 109/61 112/68   Pulse: 99 (!) 106 (!) 102   Resp: (!) 22 20 20    Temp: 98 F (36.7 C) 98.4  F (36.9 C) 98.4 F (36.9 C)   TempSrc: Oral Oral Oral   SpO2: 94% 95% 97%   Weight:    42 kg  Height:        Intake/Output Summary (Last 24 hours) at 12/24/2020 1054 Last data filed at 12/24/2020 0900 Gross per 24 hour  Intake 583.44 ml  Output 950 ml  Net -366.56 ml   Filed Weights   12/21/20 0436 12/23/20 0559 12/24/20 0405  Weight: 47 kg 41.9 kg 42 kg    Examination:  General exam: Appears calm and comfortable, not in any acute distress. Deconditioned. Respiratory system: Clear to auscultation. Respiratory effort normal. RR 17 Cardiovascular system: S1 & S2 heard, RRR. No JVD, murmurs, rubs, gallops or clicks. No pedal edema. Gastrointestinal system: Abdomen is distended, soft and nontender. No organomegaly or masses felt. Normal bowel sounds heard.   Midline surgical scar noted. Central nervous system: Alert and oriented. No focal neurological  deficits. Extremities: Symmetric 5 x 5 power.  No edema, no cyanosis, no clubbing. Skin: No rashes, lesions or ulcers Psychiatry: Judgement and insight appear normal. Mood & affect appropriate.     Data Reviewed: I have personally reviewed following labs and imaging studies  CBC: Recent Labs  Lab 12/18/20 0417 12/19/20 0417 12/20/20 0428 12/22/20 0331 12/22/20 1600 12/23/20 0312  WBC 7.8 7.8 8.1 9.5  --  16.4*  NEUTROABS  --  6.9  --  7.8*  --  13.3*  HGB 8.8* 9.7* 9.0* 6.5* 9.1* 8.7*  HCT 30.3* 31.7* 30.2* 22.6* 29.1* 27.8*  MCV 100.0 95.5 98.4 100.4*  --  93.6  PLT 140* 151 165 241  --  238   Basic Metabolic Panel: Recent Labs  Lab 12/18/20 0417 12/19/20 0417 12/20/20 0428 12/21/20 0516 12/21/20 1136 12/22/20 0331 12/23/20 0312 12/24/20 0150  NA 139   < > 139 139  --  138 139 138  K 4.3   < > 3.6 3.2*  --  4.3 4.1 4.7  CL 101   < > 102 98  --  100 101 101  CO2 29   < > 31 33*  --  34* 32 33*  GLUCOSE 119*   < > 137* 119*  --  137* 148* 122*  BUN 44*   < > 32* 30*  --  41* 56* 46*  CREATININE 1.52*   < >  0.92 0.92  --  0.90 0.89 0.81  CALCIUM 8.4*   < > 8.3* 8.3*  --  8.1* 8.2* 8.5*  MG 2.5*  --   --   --  1.9 2.0 1.9 2.0  PHOS 3.5  --   --   --  2.6 1.8* 2.1* 3.3   < > = values in this interval not displayed.   GFR: Estimated Creatinine Clearance: 33.7 mL/min (by C-G formula based on SCr of 0.81 mg/dL). Liver Function Tests: Recent Labs  Lab 12/22/20 0331 12/23/20 0312  AST 16 17  ALT 12 13  ALKPHOS 47 49  BILITOT 0.5 0.4  PROT 4.3* 4.8*  ALBUMIN 2.1* 2.4*   No results for input(s): LIPASE, AMYLASE in the last 168 hours.  No results for input(s): AMMONIA in the last 168 hours. Coagulation Profile: No results for input(s): INR, PROTIME in the last 168 hours. Cardiac Enzymes: No results for input(s): CKTOTAL, CKMB, CKMBINDEX, TROPONINI in the last 168 hours. BNP (last 3 results) No results for input(s): PROBNP in the last 8760 hours. HbA1C: No results for input(s): HGBA1C in the last 72 hours. CBG: Recent Labs  Lab 12/23/20 0740 12/23/20 1135 12/23/20 1631 12/24/20 0016 12/24/20 0432  GLUCAP 102* 158* 137* 130* 153*   Lipid Profile: Recent Labs    12/22/20 0331 12/23/20 0312  TRIG 68 107   Thyroid Function Tests: No results for input(s): TSH, T4TOTAL, FREET4, T3FREE, THYROIDAB in the last 72 hours. Anemia Panel: No results for input(s): VITAMINB12, FOLATE, FERRITIN, TIBC, IRON, RETICCTPCT in the last 72 hours. Sepsis Labs: No results for input(s): PROCALCITON, LATICACIDVEN in the last 168 hours.   Recent Results (from the past 240 hour(s))  Resp Panel by RT-PCR (Flu A&B, Covid) Nasopharyngeal Swab     Status: None   Collection Time: January 03, 2021  5:28 AM   Specimen: Nasopharyngeal Swab; Nasopharyngeal(NP) swabs in vial transport medium  Result Value Ref Range Status   SARS Coronavirus 2 by RT PCR NEGATIVE NEGATIVE Final    Comment: (NOTE) SARS-CoV-2 target nucleic acids are  NOT DETECTED.  The SARS-CoV-2 RNA is generally detectable in upper  respiratory specimens during the acute phase of infection. The lowest concentration of SARS-CoV-2 viral copies this assay can detect is 138 copies/mL. A negative result does not preclude SARS-Cov-2 infection and should not be used as the sole basis for treatment or other patient management decisions. A negative result may occur with  improper specimen collection/handling, submission of specimen other than nasopharyngeal swab, presence of viral mutation(s) within the areas targeted by this assay, and inadequate number of viral copies(<138 copies/mL). A negative result must be combined with clinical observations, patient history, and epidemiological information. The expected result is Negative.  Fact Sheet for Patients:  BloggerCourse.com  Fact Sheet for Healthcare Providers:  SeriousBroker.it  This test is no t yet approved or cleared by the [place] FDA and  has been authorized for detection and/or diagnosis of SARS-CoV-2 by FDA under an Emergency Use Authorization (EUA). This EUA will remain  in effect (meaning this test can be used) for the duration of the COVID-19 declaration under Section 564(b)(1) of the Act, 21 U.S.C.section 360bbb-3(b)(1), unless the authorization is terminated  or revoked sooner.       Influenza A by PCR NEGATIVE NEGATIVE Final   Influenza B by PCR NEGATIVE NEGATIVE Final    Comment: (NOTE) The Xpert Xpress SARS-CoV-2/FLU/RSV plus assay is intended as an aid in the diagnosis of influenza from Nasopharyngeal swab specimens and should not be used as a sole basis for treatment. Nasal washings and aspirates are unacceptable for Xpert Xpress SARS-CoV-2/FLU/RSV testing.  Fact Sheet for Patients: BloggerCourse.com  Fact Sheet for Healthcare Providers: SeriousBroker.it  This test is not yet approved or cleared by the [place] FDA and has been  authorized for detection and/or diagnosis of SARS-CoV-2 by FDA under an Emergency Use Authorization (EUA). This EUA will remain in effect (meaning this test can be used) for the duration of the COVID-19 declaration under Section 564(b)(1) of the Act, 21 U.S.C. section 360bbb-3(b)(1), unless the authorization is terminated or revoked.  Performed at [facility], [address], [place], [place] [address]   Surgical pcr screen     Status: None   Collection Time: 12/19/2020  8:54 AM   Specimen: Nasal Mucosa; Nasal Swab  Result Value Ref Range Status   MRSA, PCR NEGATIVE NEGATIVE Final   Staphylococcus aureus NEGATIVE NEGATIVE Final    Comment: (NOTE) The Xpert SA Assay (FDA approved for NASAL specimens in patients 23 years of age and older), is one component of a comprehensive surveillance program. It is not intended to diagnose infection nor to guide or monitor treatment. Performed at [facility], [address], [place], [place] [address]     Radiology Studies: DG CHEST PORT 1 VIEW  Result Date: 12/23/2020 CLINICAL DATA:  History of pneumonia and abdominal pain.  Nonverbal. EXAM: PORTABLE CHEST 1 VIEW COMPARISON:  12/20/2020 FINDINGS: The heart is enlarged, partially obscured by a bilateral pulmonary opacities. There has been reaccumulation of veiling density in the RIGHT lung consistent with increased pleural effusion. Interval placement of RIGHT-sided PICC line, tip overlying the superior vena cava. No definite pneumothorax identified. Patient is rotated towards the RIGHT, partially obscuring the RIGHT lung apex. Density in the LEFT lung is consistent atelectasis or infiltrate and pleural effusion and appear stable. IMPRESSION: 1. Interval placement of RIGHT-sided PICC line. 2. Increased RIGHT pleural effusion and RIGHT basilar opacity. Electronically Signed   By: Norva Pavlov M.D.  On: 12/23/2020 11:56     Scheduled Meds:  sodium chloride    Intravenous Once   bisacodyl  10 mg Rectal Daily   brimonidine  1 drop Both Eyes BID   Chlorhexidine Gluconate Cloth  6 each Topical Daily   dorzolamide  1 drop Both Eyes BID   feeding supplement  237 mL Oral TID BM   furosemide  20 mg Intravenous Q12H   insulin aspart  0-9 Units Subcutaneous Q6H   lip balm  1 application Topical BID   midodrine  5 mg Oral TID WC   Continuous Infusions:  sodium chloride 250 mL (12/21/20 1543)   ondansetron (ZOFRAN) IV     TPN ADULT (ION) 45 mL/hr at 12/23/20 1724   TPN ADULT (ION)       LOS: 9 days    Time spent: 25 mins    Deshanae Lindo, MD [facility]   If 7PM-7AM, please contact night-coverage

## 2020-12-24 NOTE — Progress Notes (Signed)
IR was requested for image guided thoracentesis.   Patient coded this morning, became responsive spontaneously.  Patient had thoracentesis on 7/1 which yield 400 cc pleural fluid, complicated by tiny ex vacuo PTX.  Patient current VS BP 94/63, HR 122, RR 25 O2 sat 93 % at 8L @ 1336 hrs.  Case was reviewed by Dr. Archer Asa, patient too unstable for repeat thoracentesis.   Thoracentesis on hold until pt becomes more stable.  Ordering provider notified.   Please call IR for questions and concerns.    Earline Stiner H Jhanvi Drakeford PA-C 12/24/2020 2:12 PM

## 2020-12-24 NOTE — Progress Notes (Signed)
Rapid Reponse RN responding to Code Blue called at approximately 11:45. This RN and Hazel Sams, RN entered room to find patient responsive with monitor assessing vital signs at 11:48. Patient DNR status noted. Primary RN bedside recalling possible syncopal episode. Patient stable at baseline at this time. Rapid Response RN available for assistance as needed.

## 2020-12-24 NOTE — Progress Notes (Signed)
PHARMACY - TOTAL PARENTERAL NUTRITION CONSULT NOTE   Indication: Prolonged ileus  Patient Measurements: Height: 5' (152.4 cm) Weight: 42 kg (92 lb 9.5 oz) IBW/kg (Calculated) : 45.5 TPN AdjBW (KG): 35.6 Body mass index is 18.08 kg/m.  Assessment:  Pt is an 76 yoF initially presenting with abdominal pain, N/V. Found to have high grade bowel obstruction; failed to improve with NG decompression. Pt underwent proximal colectomy with ileal resection and lysis of adhesions on 6/26 for necrotic volvulus of ileum and colon. Pharmacy consulted to start TPN on 7/1 (POD#6).  Glucose / Insulin: No hx DM. BG goal <180.  -CBG range: 102-158. 7 units SSI used Electrolytes: Lytes WNL, including Phos improved to 3.3, Mag 2.0 - CO2 up to 33, Cl 101 (despite max Cl in TPN) - CorrCa 9.78 Renal: SCr 0.81 - stable, BUN 46 Hepatic: LFTs WNL Prealbumin: 9.9 > 11.3 > 13.2 (7/4) increasing. Intake / Output; MIVF: Strict I/O not charted -UOP: 1 unmeasured; Stool unmeasured x2 -No MIVF. Furosemide 20 mg IV q12h - NG removed 7/2 GI Imaging: -6/26 CT Abd: High grade SBO, severe bowel loop distortion and narrowing GI Surgeries / Procedures:  -6/26: Proximal colectomy with ileal resection  Central access: Double lumen PICC TPN start date: 7/2  Nutritional Goals (per RD recommendations on 7/3): kCal: 1400-1600, Protein: 65-80 grams, Fluid: >/= 1.4 L  Goal TPN rate is 60 mL/hr (provides 75 g of protein, 230 g dextrose, and 1429 kcals per day)  Current Nutrition:  TPN Advance as tolerated to soft diet; calorie count ordered. Ensure Enlive TID (start 7/3) - all doses accepted on 7/4  Plan:  At 1800: Advance TPN to 60 mL/hr, goal rate Electrolytes in TPN:  Na 72mEq/L K 59mEq/L Ca 84mEq/L Mg 41mEq/L Phos 34mmol/L Cl:Ac Max Cl Add standard MVI and trace elements to TPN Continue Sensitive q6h SSI and adjust as needed  No MIVF ordered; management per MD.  Monitor TPN labs on Mon/Thurs, recheck  electrolytes with AM labs tomorrow  Lynann Beaver PharmD, BCPS Clinical Pharmacist [facility] main pharmacy [phone] 12/24/2020 9:53 AM

## 2020-12-24 NOTE — Progress Notes (Signed)
   12/24/20 1152  What Happened  Was fall witnessed? No  Was patient injured? No  Patient found on floor  Found by Staff-comment Dawna Part)  Stated prior activity to/from bed, chair, or stretcher  Follow Up  MD notified Dr Lucianne Muss  Time MD notified 1150  Family notified Yes - comment Amber Rowland (daughter))  Time family notified 1300  Additional tests No  Simple treatment  (none)  Progress note created (see row info) Yes  Adult Fall Risk Assessment  Risk Factor Category (scoring not indicated) Fall has occurred during this admission (document High fall risk);High fall risk per protocol (document High fall risk)  Patient Fall Risk Level High fall risk  Adult Fall Risk Interventions  Required Bundle Interventions *See Row Information* High fall risk - low, moderate, and high requirements implemented  Additional Interventions Reorient/diversional activities with confused patients;Use of appropriate toileting equipment (bedpan, BSC, etc.);PT/OT need assessed if change in mobility from baseline  Screening for Fall Injury Risk (To be completed on HIGH fall risk patients) - Assessing Need for Floor Mats  Risk For Fall Injury- Criteria for Floor Mats Noncompliant with safety precautions;Previous fall this admission;85 years or older  Will Implement Floor Mats Yes  Vitals  Temp (!) 97.5 F (36.4 C)  Temp Source Oral  BP 104/61  MAP (mmHg) 74  Pulse Rate (!) 113  Pulse Rate Source Dinamap  Resp 18  Oxygen Therapy  SpO2 95 %  O2 Device Nasal Cannula  O2 Flow Rate (L/min) 8 L/min  Patient Activity (if Appropriate) In bed  Pulse Oximetry Type Intermittent  Pain Assessment  Pain Scale 0-10  Pain Type Acute pain  Pain Location Back  Pain Orientation Posterior  Pain Radiating Towards neck, shoulders  Pain Descriptors / Indicators Aching  Pain Onset Sudden  Patients Stated Pain Goal 2  PAINAD (Pain Assessment in Advanced Dementia)  Breathing 0  Negative Vocalization 1  Facial  Expression 0  Body Language 0  Consolability 0  PAINAD Score 1  PCA/Epidural/Spinal Assessment  Respiratory Pattern Regular;Unlabored  Neurological  Neuro (WDL) X  Level of Consciousness Alert  Orientation Level Oriented to person;Oriented to place;Disoriented to time;Disoriented to situation  Cognition Appropriate at baseline  Speech Clear  Neuro Symptoms Forgetful  Musculoskeletal  Musculoskeletal (WDL) X  Assistive Device None  Generalized Weakness Yes  Weight Bearing Restrictions No  Integumentary  Integumentary (WDL) X  Skin Color Appropriate for ethnicity  Skin Condition Dry  Skin Integrity Surgical Incision (see LDA)  Abrasion Location Elbow  Abrasion Location Orientation Left  Cracking Location Foot;Leg  Cracking Location Orientation Bilateral   Patient found lying on her bottom next to her bed. She was difficult to arouse so Rapid Response was called. Patient was assisted back into bed, no injuries noted. MD at bedside said no need for imaging at this time. Daughter, Amber Rowland, notified. Will continue to monitor mental status and treat per MD orders.

## 2020-12-24 NOTE — Progress Notes (Signed)
   12/24/20 1332  Assess: MEWS Score  Temp 97.7 F (36.5 C)  BP (!) 94/51  Pulse Rate (!) 103  ECG Heart Rate (!) 113  Resp (!) 24  SpO2 92 %  O2 Device Nasal Cannula  O2 Flow Rate (L/min) 8 L/min  Assess: MEWS Score  MEWS Temp 0  MEWS Systolic 1  MEWS Pulse 2  MEWS RR 1  MEWS LOC 0  MEWS Score 4  MEWS Score Color Red  Assess: if the MEWS score is Yellow or Red  Were vital signs taken at a resting state? Yes  Focused Assessment No change from prior assessment  Does the patient meet 2 or more of the SIRS criteria? Yes  Does the patient have a confirmed or suspected source of infection? No  MEWS guidelines implemented *See Row Information* Yes  Treat  MEWS Interventions Administered prn meds/treatments;Administered scheduled meds/treatments;Escalated (See documentation below)  Take Vital Signs  Increase Vital Sign Frequency  Red: Q 1hr X 4 then Q 4hr X 4, if remains red, continue Q 4hrs  Escalate  MEWS: Escalate Red: discuss with charge nurse/RN and provider, consider discussing with RRT  Notify: Charge Nurse/RN  Name of Charge Nurse/RN Notified Marissa, RN  Date Charge Nurse/RN Notified 12/24/20  Time Charge Nurse/RN Notified 1430  Notify: Provider  Provider Name/Title Dr. Lucianne Muss  Date Provider Notified 12/24/20  Time Provider Notified 1338  Notification Type Page  Notification Reason Requested by patient/family  Provider response See new orders  Date of Provider Response 12/24/20  Time of Provider Response 1338  Document  Patient Outcome Not stable and remains on department  Assess: SIRS CRITERIA  SIRS Temperature  0  SIRS Pulse 1  SIRS Respirations  1  SIRS WBC 0  SIRS Score Sum  2

## 2020-12-24 NOTE — Progress Notes (Signed)
CH responded to code blue; when CH arrived at pt.'s rm. medical team shared that the ode had been cleared; pt. being attended by team.  Chaplains remain available as needed via page.  Elpidio Anis PRN Chaplain Pager: [phone]

## 2020-12-25 ENCOUNTER — Inpatient Hospital Stay (HOSPITAL_COMMUNITY): Payer: Medicare Other

## 2020-12-25 DIAGNOSIS — J90 Pleural effusion, not elsewhere classified: Secondary | ICD-10-CM

## 2020-12-25 DIAGNOSIS — Z515 Encounter for palliative care: Secondary | ICD-10-CM

## 2020-12-25 DIAGNOSIS — Z7189 Other specified counseling: Secondary | ICD-10-CM

## 2020-12-25 DIAGNOSIS — R1310 Dysphagia, unspecified: Secondary | ICD-10-CM

## 2020-12-25 DIAGNOSIS — Z9181 History of falling: Secondary | ICD-10-CM

## 2020-12-25 DIAGNOSIS — K5909 Other constipation: Secondary | ICD-10-CM

## 2020-12-25 LAB — GLUCOSE, CAPILLARY
Glucose-Capillary: 146 mg/dL — ABNORMAL HIGH (ref 70–99)
Glucose-Capillary: 245 mg/dL — ABNORMAL HIGH (ref 70–99)
Glucose-Capillary: 299 mg/dL — ABNORMAL HIGH (ref 70–99)
Glucose-Capillary: 302 mg/dL — ABNORMAL HIGH (ref 70–99)
Glucose-Capillary: 94 mg/dL (ref 70–99)

## 2020-12-25 LAB — BASIC METABOLIC PANEL
Anion gap: 4 — ABNORMAL LOW (ref 5–15)
Anion gap: 5 (ref 5–15)
BUN: 58 mg/dL — ABNORMAL HIGH (ref 8–23)
BUN: 62 mg/dL — ABNORMAL HIGH (ref 8–23)
CO2: 34 mmol/L — ABNORMAL HIGH (ref 22–32)
CO2: 30 mmol/L (ref 22–32)
Calcium: 9.2 mg/dL (ref 8.9–10.3)
Calcium: 9.1 mg/dL (ref 8.9–10.3)
Chloride: 101 mmol/L (ref 98–111)
Chloride: 104 mmol/L (ref 98–111)
Creatinine, Ser: 1.15 mg/dL — ABNORMAL HIGH (ref 0.44–1.00)
Creatinine, Ser: 1.16 mg/dL — ABNORMAL HIGH (ref 0.44–1.00)
GFR, Estimated: 47 mL/min — ABNORMAL LOW (ref >60)
GFR, Estimated: 46 mL/min — ABNORMAL LOW (ref >60)
Glucose, Bld: 337 mg/dL — ABNORMAL HIGH (ref 70–99)
Glucose, Bld: 146 mg/dL — ABNORMAL HIGH (ref 70–99)
Potassium: 6.2 mmol/L — ABNORMAL HIGH (ref 3.5–5.1)
Potassium: 6.5 mmol/L (ref 3.5–5.1)
Sodium: 139 mmol/L (ref 135–145)
Sodium: 139 mmol/L (ref 135–145)

## 2020-12-25 LAB — CBC
HCT: 31.1 % — ABNORMAL LOW (ref 36.0–46.0)
Hemoglobin: 8.8 g/dL — ABNORMAL LOW (ref 12.0–15.0)
MCH: 30 pg (ref 26.0–34.0)
MCHC: 28.3 g/dL — ABNORMAL LOW (ref 30.0–36.0)
MCV: 106.1 fL — ABNORMAL HIGH (ref 80.0–100.0)
Platelets: 344 10*3/uL (ref 150–400)
RBC: 2.93 MIL/uL — ABNORMAL LOW (ref 3.87–5.11)
RDW: 17.9 % — ABNORMAL HIGH (ref 11.5–15.5)
WBC: 19.1 10*3/uL — ABNORMAL HIGH (ref 4.0–10.5)
nRBC: 4.8 % — ABNORMAL HIGH (ref 0.0–0.2)

## 2020-12-25 LAB — PHOSPHORUS: Phosphorus: 4.3 mg/dL (ref 2.5–4.6)

## 2020-12-25 LAB — POTASSIUM: Potassium: 6.5 mmol/L (ref 3.5–5.1)

## 2020-12-25 LAB — MAGNESIUM: Magnesium: 2.3 mg/dL (ref 1.7–2.4)

## 2020-12-25 MED ORDER — DEXTROSE-NACL 5-0.9 % IV SOLN: INTRAVENOUS | Status: AC

## 2020-12-25 MED ORDER — HYDROGEN PEROXIDE 3 % EX SOLN
CUTANEOUS | Status: AC
  Filled 2020-12-25: qty 473

## 2020-12-25 MED ORDER — SODIUM ZIRCONIUM CYCLOSILICATE 10 G PO PACK
10.0000 g | PACK | Freq: Once | ORAL | Status: AC
  Administered 2020-12-25: 10 g via ORAL
  Filled 2020-12-25: qty 1

## 2020-12-25 MED ORDER — PIPERACILLIN-TAZOBACTAM 3.375 G IVPB
3.3750 g | Freq: Three times a day (TID) | INTRAVENOUS | Status: DC
  Administered 2020-12-25 – 2020-12-26 (×3): 3.375 g via INTRAVENOUS
  Filled 2020-12-25 (×3): qty 50

## 2020-12-25 MED ORDER — TRAVASOL 10 % IV SOLN
INTRAVENOUS | Status: DC
  Filled 2020-12-25: qty 748.8

## 2020-12-25 MED ORDER — BOOST / RESOURCE BREEZE PO LIQD CUSTOM
1.0000 | Freq: Two times a day (BID) | ORAL | Status: DC
  Administered 2020-12-25: 1 via ORAL

## 2020-12-25 MED ORDER — ENSURE ENLIVE PO LIQD: 237.0000 mL | ORAL | Status: DC

## 2020-12-25 MED ORDER — PIPERACILLIN-TAZOBACTAM IN DEX 2-0.25 GM/50ML IV SOLN: 2.2500 g | Freq: Four times a day (QID) | INTRAVENOUS | Status: DC

## 2020-12-25 NOTE — Progress Notes (Signed)
PHARMACY NOTE:  ANTIMICROBIAL RENAL DOSAGE ADJUSTMENT  Current antimicrobial regimen includes a mismatch between antimicrobial dosage and estimated renal function.  As per policy approved by the Pharmacy & Therapeutics and Medical Executive Committees, the antimicrobial dosage will be adjusted accordingly.  Current antimicrobial dosage:  Zosyn 2.25g IV q6h  Indication: Concern for aspiration pneumonia  Renal Function: Estimated Creatinine Clearance: 24.4 mL/min (A) (by C-G formula based on SCr of 1.15 mg/dL (H)).    Antimicrobial dosage has been changed to:  Zosyn 3.375g IV Q8H infused over 4hrs.   Thank you for allowing pharmacy to be a part of this patient's care.  Lynann Beaver PharmD, BCPS Clinical Pharmacist [facility] main pharmacy [phone] 12/25/2020 10:33 AM

## 2020-12-25 NOTE — Progress Notes (Signed)
IV was called by phlebotomy for DBIV.  BMP was ordered for 1000.  IVT collected lt grn 7/6 at [phone].  Contacted lab and the order will be added on to previous tube.

## 2020-12-25 NOTE — Progress Notes (Signed)
PHARMACY - TOTAL PARENTERAL NUTRITION CONSULT NOTE   Indication: Prolonged ileus  Patient Measurements: Height: 5' (152.4 cm) Weight: 43.3 kg (95 lb 7.4 oz) IBW/kg (Calculated) : 45.5 TPN AdjBW (KG): 35.6 Body mass index is 18.64 kg/m.  Assessment:  Pt is an 31 yoF initially presenting with abdominal pain, N/V. Found to have high grade bowel obstruction; failed to improve with NG decompression. Pt underwent proximal colectomy with ileal resection and lysis of adhesions on 6/26 for necrotic volvulus of ileum and colon. Pharmacy consulted to start TPN on 7/1 (POD#6).  Glucose / Insulin: No hx DM. BG goal <180.  -CBG range increased significantly:  147 - 302; with CBGs 299, 302, and 245 after new TPN bag hung and solumedrol 60mg  IV given at 1800. - 15 units SSI used Electrolytes: K up to 6.2 (likely related to increased rate of TPN, and AKI).  Phos up to 4.3, Mag up to 2.3 - CO2 up to 34, Cl 101 (despite max Cl in TPN) - CorrCa 10.48 Renal: SCr increased 0.8 to 1.15, BUN increased to 58 - remains on lasix IV BID, with extra dose given 7/5.  Hypotension with start of midodrine on 7/5. Hepatic: LFTs WNL (7/4) Prealbumin: 9.9 > 11.3 > 13.2 (7/4) increasing. Intake / Output; MIVF: Strict I/O not charted -UOP: 1 unmeasured; Stool unmeasured x4 -No MIVF. Furosemide 20 mg IV q12h and extra x1 on 7/5 GI Imaging: -6/26 CT Abd: High grade SBO, severe bowel loop distortion and narrowing GI Surgeries / Procedures:  -6/26: Proximal colectomy with ileal resection  Central access: Double lumen PICC TPN start date: 7/2  Nutritional Goals (per RD recommendations on 7/3): kCal: 1400-1600, Protein: 65-80 grams, Fluid: >/= 1.4 L  Goal TPN rate is 60 mL/hr (provides 75 g of protein, 230 g dextrose, and 1429 kcals per day)  Current Nutrition:  TPN Advance as tolerated to soft diet; calorie count ordered. Ensure Enlive TID (start 7/3) - all doses charted as given on 7/4, 7/5  Plan:   Now:  STOP  TPN due to hyperkalemia.  Lokelma per MD orders.   Hang D5-NS at 60 ml/hr   At 1800: Continue TPN at 60 mL/hr, goal rate Electrolytes in TPN:  Na 13mEq/L K 0 mEq/L - remove Ca 2.5 mEq/L Mg 2.5 mEq/L Phos 7 mmol/L Cl:Ac Max Cl Add standard MVI and trace elements to TPN Continue Sensitive q6h SSI and adjust as needed  No MIVF ordered; management per MD.  Monitor TPN labs on Mon/Thurs, recheck electrolytes with AM labs tomorrow  45m PharmD, BCPS Clinical Pharmacist [facility] main pharmacy [phone] 12/25/2020 8:39 AM

## 2020-12-25 NOTE — Progress Notes (Signed)
Nutrition Follow-up  DOCUMENTATION CODES:   Severe malnutrition in context of chronic illness  INTERVENTION:  - will monitor for plan concerning resumption of TPN.  - continue Ensure Enlive but will decrease from BID to once/day d/t hyperkalemia, each supplement provides 350 kcal and 20 grams of protein. - will order Boost Breeze BID, each supplement provides 250 kcal and 9 grams of protein. - diet advancement as medically feasible. - will order 48 hour Calorie Count.    NUTRITION DIAGNOSIS:   Severe Malnutrition related to chronic illness as evidenced by moderate fat depletion, mild muscle depletion, severe fat depletion, severe muscle depletion. -revised, ongoing  GOAL:   Patient will meet greater than or equal to 90% of their needs -met with TPN regimen and Ensure intake  MONITOR:   PO intake, Supplement acceptance, Diet advancement, Labs, Weight trends, Skin, Other (Comment) (TPN regimen)   ASSESSMENT:   87 year old female who presented to the ED on 6/26 with abdominal pain and distention, N/V. PMH of prior SBO requiring lysis of adhesions in 2013, severe malnutrition, HTN, CKD stage III, COPD, chronic constipation, anxiety. 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  Significant Events: 6/26- admission; proximal colectomy with ileal resection, lysis of adhesions; NGT placed 6/29- patient removed NGT 6/30- diet advanced to CLD 7/1- NPO resumed d/t significant gastric distention; NGT placed; R thoracentesis with 400 ml removed 7/2- diet advanced to CLD; double lumen PICC placed in R brachial; TPN initiation 7/3- initial RD assessment; diet advanced to FLD; NGT removed 7/5- [place] called d/t patient found unresponsive on the floor   Patient remains on FLD since 7/3 afternoon and the only documented meals since taht time were 0% of breakfast and 25% of lunch yesterday and 0% of breakfast  today. She has been accepting Ensure 100% of the time offered  Patient laying in bed with daughter and a female visitor at bedside. Daughter is concerned that patient is not as alert and conversant as she had been when she called her earlier this AM. RD made RN aware of family's concerns.   Pharmacist's note from this AM states plan to stop TPN d/t hyperkalemia; TPN was already disconnected when RD visited patient's room. She was previously receiving TPN at goal rate of 60 ml/hr which was providing 1429 kcal and 75 grams protein/24 hrs.  Weight today is consistent with weights since 7/1 but significantly up from weight on 6/26 and other weights documented in the chart since 08/13/17.  Patient was to undergo repeat thoracentesis but noted to be too unstable at this time.    Labs reviewed; CBGs: 299, 302, 245 mg/dl, K: 6.2 mmol/l, BUN: 58 mg/dl, creatinine: 1.15 mg/dl, GFR: 47 ml/min.  Medications reviewed; 10 mg rectal dulcolax/day, sliding scale novolog, 10 g lokelma x1 dose 7/6. IVF; D5-NS @ 60 ml/hr (245 kcal/24 hrs).     NUTRITION - FOCUSED PHYSICAL EXAM:  Flowsheet Row Most Recent Value  Orbital Region Moderate depletion  Upper Arm Region Severe depletion  Thoracic and Lumbar Region Unable to assess  Buccal Region Severe depletion  Temple Region Moderate depletion  Clavicle Bone Region Severe depletion  Clavicle and Acromion Bone Region Severe depletion  Scapular Bone Region Unable to assess  Dorsal Hand Moderate depletion  Patellar Region Unable to assess  Anterior Thigh Region Unable to assess  Posterior Calf Region Unable to assess  Edema (RD Assessment) Unable to assess  Hair Reviewed  Eyes Reviewed  Mouth Unable to assess  Skin Reviewed  Nails Reviewed       Diet Order:   Diet Order             Diet full liquid Room service appropriate? Yes; Fluid consistency: Thin  Diet effective now                   EDUCATION NEEDS:   Not appropriate for education at  this time  Skin:  Skin Assessment: Skin Integrity Issues: Skin Integrity Issues:: Stage II, Incisions Stage II: coccyx Incisions: abdomen (6/26)  Last BM:  7/5 (type 5 x2)  Height:   Ht Readings from Last 1 Encounters:  11/25/2020 5' (1.524 m)    Weight:   Wt Readings from Last 1 Encounters:  12/25/20 43.3 kg     Estimated Nutritional Needs:  Kcal:  1400-1600 Protein:  65-80 grams Fluid:  >/= 1.4 L/day      Jarome Matin, MS, RD, LDN, CNSC Inpatient Clinical Dietitian RD pager # available in [facility]  After hours/weekend pager # available in [facility]

## 2020-12-25 NOTE — Consult Note (Signed)
Palliative Care Consult Note                                  Date: 12/25/2020   Patient Name: Amber Rowland  DOB: Nov 04, 1933  MRN: [id number]  Age / Sex: 87 y.o., female  PCP: Ladell Pier, MD Referring Physician: Shawna Clamp, MD  Reason for Consultation: Establishing goals of care  HPI/Patient Profile: Palliative Care consult requested for goals of care discussion in this 87 y.o. female  with past medical history of SBO s/p lysis of adhesion (2013), hypertension, CKD 3, malnutrition, constipation, COPD, glaucoma, esophogeal dysmotility, and recurrent UTIs. She was admitted on 12/03/2020 from home with complaints of severe abdominal pain, distention, and nausea/vomiting x2 days. During work-up CT abdomen showed high-grade small bowel obstruction. NG tube inserted. Patient seen by General surgery and is s/p proximal colectomy with ileal resection, LOA with closed-loop obstruction and necrosis by Dr. Johney Maine (6/26). Initially was receiving TPN.   Past Medical History:  Diagnosis Date   Atrophy of left kidney    Chronic atrophy of the left kidney.   Benign breast cysts in female 04/12/2012   Seen on mammography 2009    Chronic constipation    COPD (chronic obstructive pulmonary disease) (HCC)    Hyperinflation and heavy smoking history strongly   Ejection fraction    Esophageal dysmotility 02/14/2015   Glaucoma (increased eye pressure)    HTN (hypertension) 04/12/2012   Ileus following gastrointestinal surgery ([place]) 04/28/2012   Malnutrition ([place]) 04/28/2012   Ovarian cyst, right 2009   s/p BSO 2009: OVARIAN FIBROTHECOMA in multiple fluid filled cysts  4.5cm region   SBO (small bowel obstruction) (HCC)    Severe malnutrition ([place]) 09/16/2013   Stage III chronic kidney disease ([place]) 02/13/2015   UTI (lower urinary tract infection)    History of chronic recurrent UTIs     Subjective:   This NP Amber Rowland reviewed medical  records, received report from team, assessed the patient and then met at the patient's bedside with patient's daughter, Amber Rowland to discuss diagnosis, prognosis, [place], EOL wishes disposition and options.  [name] is lethargic. She will intermittently open eyes but quickly closes. Withdraws from painful stimuli.    Concept of Palliative Care was introduced as specialized medical care for people and their families living with serious illness.  It focuses on providing relief from the symptoms and stress of a serious illness.  The goal is to improve quality of life for both the patient and the family. Values and goals of care important to patient and family were attempted to be elicited.  Created space and opportunity for daughter to explore thoughts and feelings.   Amber Rowland shares prior to admission patient was ambulatory in the home without assistive device. She was able to perform most ADLs independently. Daughter states her appetite has always been minimal due to esophageal and GI issues. She would at times repeat things but mainly alert and oriented. She enjoyed being home and did not show much interest in going outside.   We discussed Her current illness and what it means in the larger context of Her on-going co-morbidities. Natural disease trajectory and expectations were discussed. Questions and concerns addressed.   Daughter verbalizes patient's current hospital course. She is somewhat frustrated in patient's significant decline over the past 24 hours. She shares patient was up out of the bed on yesterday and even able to  speak with her on the phone earlier this morning and now will not wake up. I reviewed at length patient's medication regimen, labs, and current plan of care. Daughter verbalizes understanding and appreciation. She states she feels that something has happened to cause such a decline. Support provided.   Amber Rowland is tearful expressing her stress of her mother not doing well  and now her brother who is downstairs in the ER being evaluated due to gross hematuria. She shares he has prostate cancer and has just completed his chemoradiation treatments 3 weeks ago. Today he has not been feeling well and having significant bleeding. Emotional support provided.    Emphathy provided at such a difficult time for daughter. Given patient's lethargy I did have an open and honest discussion with daughter regarding concerns that her mother may be approaching end-of-life, with high risk of sudden death. Daughter is tearful expressing her concern for this however, stating she is not ready for her mother to pass away and doesn't know how she will cope. Support provided and chaplain support offered. Daughter declined.   I discussed the importance of continued conversation with family and their medical providers regarding overall plan of care and treatment options, ensuring decisions are within the context of the patients values and GOCs.  Questions and concerns were addressed.  The family was encouraged to call with questions or concerns.  PMT will continue to support holistically as needed.  Life Review: Patient is widowed. Lives in the home with her son, Amber Rowland. She has 2 children. She is a retired Haematologist. Daughter states patient was not a spiritual lady although she is of [place] faith.   Patient Values: Being in the home with family.    Objective:   Primary Diagnoses: Present on Admission:  HTN (hypertension)  SBO (small bowel obstruction) (HCC)  Protein-calorie malnutrition, severe (HCC)  Glaucoma  COPD (chronic obstructive pulmonary disease) (HCC)  Chronic constipation  Anxiety  Stage III chronic kidney disease (HCC)  Scoliosis of cervicothoracic spine  Primary osteoarthritis involving multiple joints  Urge incontinence of urine   Scheduled Meds:  sodium chloride   Intravenous Once   bisacodyl  10 mg Rectal Daily   brimonidine  1 drop Both Eyes BID    Chlorhexidine Gluconate Cloth  6 each Topical Daily   dorzolamide  1 drop Both Eyes BID   enoxaparin (LOVENOX) injection  30 mg Subcutaneous Q24H   feeding supplement  1 Container Oral BID BM   feeding supplement  237 mL Oral Q24H   hydrogen peroxide       insulin aspart  0-9 Units Subcutaneous Q6H   lip balm  1 application Topical BID   midodrine  5 mg Oral TID WC    Continuous Infusions:  sodium chloride 250 mL (12/21/20 1543)   dextrose 5 % and 0.9% NaCl 60 mL/hr at 12/25/20 1035   ondansetron (ZOFRAN) IV     piperacillin-tazobactam (ZOSYN)  IV 3.375 g (12/25/20 1219)   TPN ADULT (ION)      PRN Meds: sodium chloride, acetaminophen **OR** acetaminophen, albuterol, alum & mag hydroxide-simeth, diphenhydrAMINE, magic mouthwash, menthol-cetylpyridinium, ondansetron (ZOFRAN) IV **OR** ondansetron (ZOFRAN) IV, ondansetron **OR** [DISCONTINUED] ondansetron (ZOFRAN) IV, phenol, prochlorperazine, simethicone, sodium chloride flush  Allergies  Allergen Reactions   Advair Hfa [Fluticasone-Salmeterol]     Did not tolerate   Symbicort [Budesonide-Formoterol Fumarate]     chet pain    Review of Systems  Unable to perform ROS: Mental status change    Physical Exam  General: obtunded, frail chronically-ill appearing Cardiovascular: hypotensive, RRR Pulmonary: rales bilaterally, 8L/Bangs Abdomen: soft, nontender, + bowel sounds, incision CDI Extremities: BUE edema Neurological: unresponsive, withdraws from stimuli  Vital Signs:  BP (!) 88/46 (BP Location: Left Arm)   Pulse 90   Temp (!) 97.5 F (36.4 C) (Oral)   Resp (!) 39   Ht 5' (1.524 m)   Wt 43.3 kg   SpO2 94%   BMI 18.64 kg/m  Pain Scale: 0-10 POSS *See Group Information*: 1-Acceptable,Awake and alert Pain Score: Asleep  SpO2: SpO2: 94 % O2 Device:SpO2: 94 % O2 Flow Rate: .O2 Flow Rate (L/min): 8 L/min  IO: Intake/output summary:  Intake/Output Summary (Last 24 hours) at 12/25/2020 1634 Last data filed at 12/25/2020  1557 Gross per 24 hour  Intake 940.48 ml  Output --  Net 940.48 ml    LBM: Last BM Date: 12/23/20 Baseline Weight: Weight: 37 kg Most recent weight: Weight: 43.3 kg      Palliative Assessment/Data: Lethargic    Advanced Care Planning:   Primary Decision Maker: NEXT OF KIN  Code Status/Advance Care Planning: DNR  A discussion was had today regarding advanced directives. Concepts specific to code status, artifical feeding and hydration, continued IV antibiotics and rehospitalization was had.   Daughter confirms DNR/DNI.   The difference between a aggressive medical intervention path and a palliative comfort care path was discussed.   Recommendations for comfort care in the setting of advanced age, malnutrition, lethargy, hypotension. Daughter declines and states she is not prepared to focus on her mother's comfort. I expressed concerns for patient's sudden death and further decline. Daughter verbalized understanding confirming wishes to continue to treat the treatable. She states she is hopeful her mother will not pass away and possibly improve. She is remaining hopeful for the best but also understands to prepare for the worst.    Assessment & Plan:   SUMMARY OF RECOMMENDATIONS   DNR/DNI-as confirmed by daughter Continue with current plan of care, treat the treatable, daughter is anxiously awaiting CT scan results. She is hopeful this will identify the significant change in patient.  Daughter is emotional and frustrated in patient's drastic change with no recognizable source. Detailed discussion and support provided. She understands patient is at high risk of sudden death. She is remaining hopeful but also preparing for the worst.  PMT will continue to support and follow as needed. Please call team line with urgent needs.  Symptom Management:  Per Attending  Palliative Prophylaxis:  Aspiration, Bowel Regimen, Frequent Pain Assessment, and Turn Reposition  Additional  Recommendations (Limitations, Scope, Preferences): Treat the treatable, watchful waiting.   Psycho-social/Spiritual:  Desire for further Chaplaincy support: no Additional Recommendations: Caregiving  Support/Resources and Education on Hospice  Prognosis:  POOR   Discharge Planning:  To Be Determined   Discussed with: RN and Dr. Dwyane Dee.   Daughter, Amber Rowland expressed understanding and was in agreement with this plan.   Time In: 1505 Time Out: 1630 Time Total: 85 min.   Visit consisted of counseling and education dealing with the complex and emotionally intense issues of symptom management and palliative care in the setting of serious and potentially life-threatening illness.Greater than 50%  of this time was spent counseling and coordinating care related to the above assessment and plan.  Signed by:  Alda Lea, AGPCNP-BC Palliative Medicine Team  Phone: [phone] Pager: [phone] Amion: Bjorn Pippin   Thank you for allowing the Palliative Medicine Team to assist in the care of this patient. Please  utilize secure chat with additional questions, if there is no response within 30 minutes please call the above phone number. Palliative Medicine Team providers are available by phone from 7am to 5pm daily and can be reached through the team cell phone.  Should this patient require assistance outside of these hours, please call the patient's attending physician.

## 2020-12-25 NOTE — Consult Note (Signed)
NAME:  Amber Rowland, MRN:  [id number], DOB:  September 24, 1933, LOS: 10 ADMISSION DATE:  12/20/2020, CONSULTATION DATE:  12/25/20  REFERRING MD:  Lucianne Muss TRH, CHIEF COMPLAINT:  unable to obtain due to patient factors, reason for consult hypoxemia   History of Present Illness:  87 year old admitted with volvulus, SBO with necrosis status post colectomy 6 /26.  Unfortunate patient is not communicative and cannot provide history.  PCCM consulted due to worsening hypoxemia.  Patient is appropriately DNR.  Never had CODE BLUE was called 7/5.  Seems like she was unresponsive, maybe fell.  Code was terminated.  Never lost a pulse that I can tell.  Throughout the day she had worsening hypoxemia.  Eventually was on nonrebreather in the afternoon.  Received Lasix, Solu-Medrol for some wheezing on exam.  Weaned to 8 L and seemed to be more stable.  Has ongoing hypoxemia on 8 L.  Chest x-ray obtained 7/4 on my interpretation reveals bilateral effusions and likely bilateral atelectasis versus infiltrate.  Relatively similar appearing, slightly improved compared to prior chest x-ray 7/1.  Notably, had thoracentesis on the right 7/1 with tiny apical pneumothorax ex vacuo afterwards.  400 cc of fluid removed.  No cell count.  Described as yellow.  CT scan 12/20/2020 monitor patient reveals bilateral atelectasis compressive from bilateral pleural effusions.  This morning labs notable for jump in creatinine 0.8 to around 1.2.  Potassium elevated at 6.2.  Glucose markedly elevated from prior in the 300s.  Unclear if these are accurate values, notably she is on TPN, not sure where labs were drawn from.  At time of evaluation she seemed to be breathing relatively comfortable on 8 L.  Again, she cannot communicate effectively.  Seem to be able to answer yes or no via voice that is lower than a whisper.  No clear phonation.  No higher level communication could be achieved.  Pertinent  Medical History  Volvulus, SBO, colectomy  6/26  Significant Hospital Events: Including procedures, antibiotic start and stop dates in addition to other pertinent events   6/26 surgery for abdominal catastrophe 7/6 PCCM consulted for worsening hypoxemia  Interim History / Subjective:  More hypoxemic yesterday, weaned from nonrebreather to 8 L.  Seems like was on less oxygen via nasal cannula in the preceding days.  Chest x-ray obtained and on my interpretation difficult to comment given challenging anatomy, appears to have bilateral effusions, bilateral lower lobe infiltrate/atelectasis.  Objective   Blood pressure (!) 103/57, pulse (!) 101, temperature 98 F (36.7 C), temperature source Oral, resp. rate (!) 40, height 5' (1.524 m), weight 43.3 kg, SpO2 94 %.        Intake/Output Summary (Last 24 hours) at 12/25/2020 0272 Last data filed at 12/25/2020 0200 Gross per 24 hour  Intake 549.34 ml  Output --  Net 549.34 ml   Filed Weights   12/23/20 0559 12/24/20 0405 12/25/20 0528  Weight: 41.9 kg 42 kg 43.3 kg    Examination: General: ill appearing, lying in bed nearly supine Eyes: EOMI, no icterus Neck: Kyphotic, limited range of motion CV: Tachycardic, irregular Pulmonary: Diminished in bases, crackles, mild tachypnea Abdomen: Nondistended, mildly tender MSK: No joint effusion, no synovitis Neuro: Attempted communicate, mild yes no, no higher level communication attempted, voice is very weak, almost below whisper, no focal deficits Psych: Alert, inattentive, unable to assess mood, affect  Resolved Hospital Problem list     Assessment & Plan:  Acute hypoxemic respiratory failure: Multifactorial related to volume overload, restrictive  physiology/kyphosis/abdominal surgery with likely atelectasis. New leukocytosis, RLL infiltrate - likely atelectasis as seems persistent. CXR difficult to interpret given anatomy. Seems high risk for aspiration. She received appropriate diuresis 7/5 with Cr rise. Etiology of bilateral pleural  effusions likely volume overload/third spacing in setting of hypoalbuminemia due to severe protein calorie malnutrition. No pleural fluid sent to evaluate Light's criteria. BNP was markedly elevated further supporting volume overload. Degree of hypoxemia out of proportion to effusions alone. Given she may have developed AKI with diuresis she is in grave danger as diuresis is mainstay of therapy. She has re-accumulated effusion few days after thoracentesis.  --Given total clinical picture, she is poor candidate for invasive procedures --Repeat BMP to see if changes on labs are real, if repeat labs reflect stable Cr, would advocate for ongoing diuresis, lasix d/c'd pending results --She needs diuresis to remove fluid, if she can not tolerate diuresis (possible intravascular dry despite signs of overload in chest) then she has high mortality risk --Recommend palliative consult to address GOC, comfort care would be reasonable --Recommend empiric antibiotics, zosyn ordered given concern for aspiration   Best Practice (right click and "Reselect all SmartList Selections" daily)   Per primary  Labs   CBC: Recent Labs  Lab 12/19/20 0417 12/20/20 0428 12/22/20 0331 12/22/20 1600 12/23/20 0312 12/25/20 0410  WBC 7.8 8.1 9.5  --  16.4* 19.1*  NEUTROABS 6.9  --  7.8*  --  13.3*  --   HGB 9.7* 9.0* 6.5* 9.1* 8.7* 8.8*  HCT 31.7* 30.2* 22.6* 29.1* 27.8* 31.1*  MCV 95.5 98.4 100.4*  --  93.6 106.1*  PLT 151 165 241  --  238 344    Basic Metabolic Panel: Recent Labs  Lab 12/21/20 0516 12/21/20 1136 12/22/20 0331 12/23/20 0312 12/24/20 0150 12/25/20 0410  NA 139  --  138 139 138 139  K 3.2*  --  4.3 4.1 4.7 6.2*  CL 98  --  100 101 101 101  CO2 33*  --  34* 32 33* 34*  GLUCOSE 119*  --  137* 148* 122* 337*  BUN 30*  --  41* 56* 46* 58*  CREATININE 0.92  --  0.90 0.89 0.81 1.15*  CALCIUM 8.3*  --  8.1* 8.2* 8.5* 9.2  MG  --  1.9 2.0 1.9 2.0  --   PHOS  --  2.6 1.8* 2.1* 3.3  --     GFR: Estimated Creatinine Clearance: 24.4 mL/min (A) (by C-G formula based on SCr of 1.15 mg/dL (H)). Recent Labs  Lab 12/20/20 0428 12/22/20 0331 12/23/20 0312 12/25/20 0410  WBC 8.1 9.5 16.4* 19.1*    Liver Function Tests: Recent Labs  Lab 12/22/20 0331 12/23/20 0312  AST 16 17  ALT 12 13  ALKPHOS 47 49  BILITOT 0.5 0.4  PROT 4.3* 4.8*  ALBUMIN 2.1* 2.4*   No results for input(s): LIPASE, AMYLASE in the last 168 hours. No results for input(s): AMMONIA in the last 168 hours.  ABG    Component Value Date/Time   TCO2 31 08/14/2017 0144     Coagulation Profile: No results for input(s): INR, PROTIME in the last 168 hours.  Cardiac Enzymes: No results for input(s): CKTOTAL, CKMB, CKMBINDEX, TROPONINI in the last 168 hours.  HbA1C: Hgb A1c MFr Bld  Date/Time Value Ref Range Status  09/16/2013 09:58 PM 5.4 <5.7 % Final    Comment:    (NOTE)  According to the ADA Clinical Practice Recommendations for 2011, when HbA1c is used as a screening test:  >=6.5%   Diagnostic of Diabetes Mellitus           (if abnormal result is confirmed) 5.7-6.4%   Increased risk of developing Diabetes Mellitus References:Diagnosis and Classification of Diabetes Mellitus,Diabetes Care,2011,34(Suppl 1):S62-S69 and Standards of Medical Care in         Diabetes - 2011,Diabetes Care,2011,34 (Suppl 1):S11-S61.    CBG: Recent Labs  Lab 12/24/20 1135 12/24/20 1642 12/25/20 0008 12/25/20 0525 12/25/20 0757  GLUCAP 152* 147* 299* 302* 245*    Review of Systems:   Unobtainable due to patient circumstances.  Past Medical History:  She,  has a past medical history of Atrophy of left kidney, Benign breast cysts in female (04/12/2012), Chronic constipation, COPD (chronic obstructive pulmonary disease) (HCC), Ejection fraction, Esophageal dysmotility (02/14/2015), Glaucoma (increased eye pressure), HTN (hypertension)  (04/12/2012), Ileus following gastrointestinal surgery (HCC) (04/28/2012), Malnutrition (HCC) (04/28/2012), Ovarian cyst, right (2009), SBO (small bowel obstruction) (HCC), Severe malnutrition (HCC) (09/16/2013), Stage III chronic kidney disease (HCC) (02/13/2015), and UTI (lower urinary tract infection).   Surgical History:   Past Surgical History:  Procedure Laterality Date   BILATERAL OOPHORECTOMY  2009   PROCEDURE:  diagnostic laparoscopy, laparotomy with bilateral salpingo-   LAPAROTOMY  04/23/2012   Procedure: EXPLORATORY LAPAROTOMY;  Surgeon: Valarie Merino, MD;  Location: [facility];  Service: General;  Laterality: N/A;  enterolysis    LAPAROTOMY N/A 12/24/2020   Procedure: EXPLORATORY LAPAROTOMY; Norva Riffle;  Surgeon: Karie Soda, MD;  Location: [facility];  Service: General;  Laterality: N/A;     Social History:   reports that she quit smoking about 6 years ago. Her smoking use included cigarettes. She has a 48.00 pack-year smoking history. She has never used smokeless tobacco. She reports that she does not drink alcohol and does not use drugs.   Family History:  Her family history includes Coronary artery disease in her son; Diabetes in her son; High blood pressure in her son.   Allergies Allergies  Allergen Reactions   Advair Hfa [Fluticasone-Salmeterol]     Did not tolerate   Symbicort [Budesonide-Formoterol Fumarate]     chet pain     Home Medications  Prior to Admission medications   Medication Sig Start Date End Date Taking? Authorizing Provider  albuterol (VENTOLIN HFA) 108 (90 Base) MCG/ACT inhaler Inhale 2 puffs into the lungs every 6 (six) hours as needed for wheezing or shortness of breath. 02/27/20  Yes Marcine Matar, MD  amLODipine (NORVASC) 5 MG tablet TAKE 1.5 TABLETS BY MOUTH DAILY. 08/11/20  Yes Marcine Matar, MD  aspirin EC 81 MG tablet Take 81-162 mg by mouth daily as needed. Swallow whole.   Yes [provider]  brimonidine (ALPHAGAN) 0.15 %  ophthalmic solution Place 1 drop into both eyes 2 (two) times daily.  12/11/19  Yes [provider]  dorzolamide (TRUSOPT) 2 % ophthalmic solution Place 1 drop into both eyes 2 (two) times daily. 12/11/20  Yes [provider]  Elastic Bandages & Supports (MEDICAL COMPRESSION STOCKINGS) MISC Use as directed. Diagnosis: bilateral lower extremity edema R22.43. Please fit as appropriate. 08/07/20  Yes Hagler, Arlys John, MD  furosemide (LASIX) 20 MG tablet Take 1-2 tablets every morning to help with the swelling in your legs. 08/07/20  Yes Hagler, Arlys John, MD  tolnaftate (TINACTIN) 1 % cream Apply 1 application topically 2 (two) times daily. Apply to feet twice daily 12/16/19  Yes Arby Barrette, MD  sertraline (ZOLOFT) 50 MG tablet TAKE 1/2 TABLET BY MOUTH DAILY Patient not taking: Reported on 2020-12-16 07/11/20   Marcine Matar, MD     Critical care time: n/a

## 2020-12-25 NOTE — Progress Notes (Signed)
Progress Note  10 Days Post-Op  Subjective: Patient lethargic this AM, very different from yesterday. Unable to arouse even with sternal rub. Daughter is at bedside and reports she spoke to her mother earlier this AM on the phone and she was alert and oriented and making sense. She is very concerned about change in mental status. She seems to be frustrated that we are unsure exactly what has caused mental status change, I tried to provide reassurance that we are working it up.   Objective: Vital signs in last 24 hours: Temp:  [97.5 F (36.4 C)-98 F (36.7 C)] 97.7 F (36.5 C) (07/06 1037) Pulse Rate:  [96-114] 96 (07/06 1037) Resp:  [18-40] 38 (07/06 1037) BP: (94-134)/(51-86) 106/57 (07/06 1037) SpO2:  [88 %-95 %] 93 % (07/06 1037) Weight:  [43.3 kg] 43.3 kg (07/06 0528) Last BM Date: 12/23/20  Intake/Output from previous day: 07/05 0701 - 07/06 0700 In: 789.3 [P.O.:290; I.V.:499.3] Out: -  Intake/Output this shift: No intake/output data recorded.  PE: General: WD, elderly female who is laying in bed and very lethargic  Heart: regular, rate, and rhythm.   Lungs: rales bilaterally, on 8L HFNC Abd: soft, ND, incision c/d/I, +BS Ext: BUE mildly edematous    Lab Results:  Recent Labs    12/23/20 0312 12/25/20 0410  WBC 16.4* 19.1*  HGB 8.7* 8.8*  HCT 27.8* 31.1*  PLT 238 344   BMET Recent Labs    12/24/20 0150 12/25/20 0410 12/25/20 0937  NA 138 139  --   K 4.7 6.2* 6.5*  CL 101 101  --   CO2 33* 34*  --   GLUCOSE 122* 337*  --   BUN 46* 58*  --   CREATININE 0.81 1.15*  --   CALCIUM 8.5* 9.2  --    PT/INR No results for input(s): LABPROT, INR in the last 72 hours. CMP     Component Value Date/Time   NA 139 12/25/2020 0410   K 6.5 (HH) 12/25/2020 0937   CL 101 12/25/2020 0410   CO2 34 (H) 12/25/2020 0410   GLUCOSE 337 (H) 12/25/2020 0410   BUN 58 (H) 12/25/2020 0410   CREATININE 1.15 (H) 12/25/2020 0410   CALCIUM 9.2 12/25/2020 0410   PROT 4.8  (L) 12/23/2020 0312   ALBUMIN 2.4 (L) 12/23/2020 0312   AST 17 12/23/2020 0312   ALT 13 12/23/2020 0312   ALKPHOS 49 12/23/2020 0312   BILITOT 0.4 12/23/2020 0312   GFRNONAA 47 (L) 12/25/2020 0410   GFRAA >60 03/17/2020 1624   Lipase     Component Value Date/Time   LIPASE 23 11/27/2020 0004       Studies/Results: DG CHEST PORT 1 VIEW  Result Date: 12/23/2020 CLINICAL DATA:  History of pneumonia and abdominal pain.  Nonverbal. EXAM: PORTABLE CHEST 1 VIEW COMPARISON:  12/20/2020 FINDINGS: The heart is enlarged, partially obscured by a bilateral pulmonary opacities. There has been reaccumulation of veiling density in the RIGHT lung consistent with increased pleural effusion. Interval placement of RIGHT-sided PICC line, tip overlying the superior vena cava. No definite pneumothorax identified. Patient is rotated towards the RIGHT, partially obscuring the RIGHT lung apex. Density in the LEFT lung is consistent atelectasis or infiltrate and pleural effusion and appear stable. IMPRESSION: 1. Interval placement of RIGHT-sided PICC line. 2. Increased RIGHT pleural effusion and RIGHT basilar opacity. Electronically Signed   By: Norva Pavlov M.D.   On: 12/23/2020 11:56    Anti-infectives: Anti-infectives (From admission, onward)  Start     Dose/Rate Route Frequency Ordered Stop   12/25/20 1200  piperacillin-tazobactam (ZOSYN) IVPB 2.25 g  Status:  Discontinued        2.25 g 100 mL/hr over 30 Minutes Intravenous Every 6 hours 12/25/20 1020 12/25/20 1029   12/25/20 1115  piperacillin-tazobactam (ZOSYN) IVPB 3.375 g        3.375 g 12.5 mL/hr over 240 Minutes Intravenous Every 8 hours 12/25/20 1029     12/17/20 0015  cefoTEtan (CEFOTAN) 2 g in sodium chloride 0.9 % 100 mL IVPB        2 g 200 mL/hr over 30 Minutes Intravenous Every 12 hours 12/16/20 2316 12/17/20 0254   12/16/20 1100  cefTRIAXone (ROCEPHIN) 2 g in sodium chloride 0.9 % 100 mL IVPB        2 g 200 mL/hr over 30 Minutes  Intravenous Daily 12/16/20 1010 12/20/20 0853   27-Dec-2020 1000  cefTRIAXone (ROCEPHIN) 2 g in sodium chloride 0.9 % 100 mL IVPB       See Hyperspace for full Linked Orders Report.   2 g 200 mL/hr over 30 Minutes Intravenous On call to O.R. 2020/12/27 0905 12/27/2020 1045   12-27-2020 1000  metroNIDAZOLE (FLAGYL) IVPB 500 mg  Status:  Discontinued       See Hyperspace for full Linked Orders Report.   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 Michaell Cowing - 6/26 - TPN off currently - pt too lethargic for PO intake currently but was tolerating FLD and having bowel function  - Mobilize. PT rec SNF - Pulm toilet   FEN - TPN stopped currently  VTE - SCDs, Lovenox ID - Rocephin/Flagyl 6/26 - 7/1; Zosyn 7/6>> Foley - out Follow-up - Dr. Michaell Cowing   Lethargy - patient barely able to be aroused, CTH ordered and discussed with primary attending  - unfortunately patient has declined significantly, agree with palliative care consult  BL pleural effusions/Hypoxia - CXR today looks worsening, pulm consulted by primary attending  ABL anemia - monitor daily HTN AKI w/ CKD3 - Cr up some this AM Hyperkalemia - K 6, per primary   LOS: 10 days    Juliet Rude, [facility] Surgery 12/25/2020, 11:31 AM Please see Amion for pager number during day hours 7:00am-4:30pm

## 2020-12-25 NOTE — Progress Notes (Signed)
Date and time results received: 12/25/20 1016  Test: Potassium Critical Value:  6.5  Name of Provider Notified: Dr. Lucianne Muss  Orders Received? Or Actions Taken?: Lokelma and TPN stopped.

## 2020-12-25 NOTE — Progress Notes (Addendum)
PROGRESS NOTE    Amber Rowland  ZOX:[id number] DOB: 05/05/1934 DOA: 2020/06/27 PCP: Marcine MatarJohnson, Deborah B, MD   Brief Narrative:  This 87 year old female with history of small bowel obstruction requiring lysis of adhesions in 2013, malnutrition, hypertension, CKD stage III who presented to the emergency department with complaints of severe abdominal pain, distention, nausea/vomiting.  CT abdomen showed high-grade small bowel obstruction with transition point at the right lower quadrant.  Patient was admitted for SBO, NG tube inserted.  General surgery consulted and following.  Underwent expiratory laparotomy on 6/26.  Hospital course remarkable for lethargy after receiving a dose of Dilaudid, improvement with Narcan.  She was also hypotensive on 12/19/2020 and was moved to progressive unit.  She is started on midodrine and blood pressure has improved. 12/25/20: Patient very lethargic in the morning, unable to arouse with sternal rub. She opens eyes but not responsive.  PCCM consulted.  Palliative care consulted to discuss goals of care.  Patient has poor prognosis.  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   CT abdomen showed high-grade small bowel obstruction with transition point at the right lower quadrant.   She underwent exploratory laparotomy on 6/26,  s/p proximal colectomy with ileal resection. Continue antiemetics, pain management. She has completed the course of 5 days postop antibiotics. She was on full liquid diet,  tolerating well without any pain. She is able to pass flatus and has bowel movements. General surgery recommended to advance diet as tolerated and continue TPN for calorie count. TPN discontinued today since has hyperkalemia. Patient more lethargic.   Acute hypoxic respiratory failure / COPD:  She does not use oxygen at baseline. She has H/O COPD, not wheezing at present.   Most likely she was volume overloaded. Chest x-ray showed bilateral features of pulmonary edema.     Continue Lasix 20 mg IV BID, She had received a lot of fluids during this hospitalization.  CT chest showed bilateral pleural effusion. Patient underwent Right thoracocentesis,  400 cc of clear fluid drained. Respiratory status was better until 12/24/20, repeat chest x-ray shows worsening effusion. IR consulted for thoracocentesis, unstable to have thoracocentesis as per IR. Patient is on 8 L of supplemental oxygen sats 97%. PCCM consulted recommended continue diuresis, IV Zosyn for possible aspiration pneumonia.   Patient is poor candidate for invasive procedures.  Continue to monitor clinical status.   Hypertension:  She became hypotensive on 6/30.  Antihypertensives on hold.   She is started on midodrine 5 mgTID, increased to 10 mg TID , BP improved. Reduce midodrine to 5 mg TID since she developed tachycardia. Monitor blood pressure.    Severe malnutrition:  Nutrition team consulted ,  General surgery recommended TPN.  TPN discontinued since she has developed hyperkalemia and she is more lethargic.   CKD stage IIIb:  Currently kidney function at baseline.  Serum creatinine has slightly bumped up due to Lasix. Avoid nephrotoxins. Continue to moniter   Normocytic anemia:  Hemoglobin dropped to 6.8, continue monitoring.  No evidence of active  bleeding.   Iron studies show severe iron deficiency.  She was given a dose of IV iron infusion.   S/p 1PRBC Hb 9.1>8.7, continue to moniter   Thrombocytopenia: > Resolved.   Acute encephalopathy/lethargy:  Patient is very sensitive to opioids. She became lethargic on 12/17/2020 after receiving a dose of Dilaudid.   Symptoms improved with Narcan.  Avoid opiates as much as possible.   CT head did not show any acute intracranial abnormalities, showed chronic small vessel disease.   12/25/20: Patient very lethargic since morning, unable to arouse with sternal rub. She opens eyes but not responsive.  PCCM consulted.  Palliative care consulted to discuss goals of care.  Patient has poor prognosis. Obtain CT head.  Goals of care/debility/deconditioning:  DNR.  PT/OT evaluation done, recommended skilled nursing facility on discharge.TOC following   Hyperkalemia: Lokelma given, continue to monitor.   DVT prophylaxis: SCDs / Lovenox. Code Status: DNR Family Communication: Son and daughter at bedside.  Explained to the daughter about poor prognosis. Disposition Plan:   Status is: Inpatient  Remains inpatient appropriate because:Inpatient level of care appropriate due to severity of illness  Dispo: The patient is from: SNF              Anticipated d/c is to: SNF              Patient currently is not medically stable to d/c.   Difficult to place patient No  Consultants:  General Surgery  Procedures:  Exploratory Lapratomy  Antimicrobials:   Anti-infectives (From admission, onward)    Start     Dose/Rate Route Frequency Ordered Stop   12/25/20 1200  piperacillin-tazobactam (ZOSYN) IVPB 2.25 g  Status:  Discontinued        2.25 g 100 mL/hr over 30 Minutes Intravenous Every 6 hours 12/25/20 1020 12/25/20 1029   12/25/20 1115  piperacillin-tazobactam (ZOSYN) IVPB 3.375 g        3.375 g 12.5 mL/hr over 240 Minutes Intravenous Every 8 hours 12/25/20 1029     12/17/20 0015  cefoTEtan  (CEFOTAN) 2 g in sodium chloride 0.9 % 100 mL IVPB        2 g 200 mL/hr over 30 Minutes Intravenous Every 12 hours 12/16/20 2316 12/17/20 0254   12/16/20 1100  cefTRIAXone (ROCEPHIN) 2 g in sodium chloride 0.9 % 100 mL IVPB        2 g 200 mL/hr over 30 Minutes Intravenous Daily 12/16/20 1010 12/20/20 0853   01-07-21 1000  cefTRIAXone (ROCEPHIN) 2 g in sodium chloride 0.9 % 100 mL IVPB       See Hyperspace for full Linked Orders Report.   2 g 200 mL/hr over 30 Minutes Intravenous On call to O.R. Jan 07, 2021 0905 01-07-2021 1045   Jan 07, 2021 1000  metroNIDAZOLE (FLAGYL) IVPB 500 mg  Status:  Discontinued       See Hyperspace for full Linked Orders Report.   500 mg 100 mL/hr over 60 Minutes Intravenous Every 8 hours 2021-01-07 0905 12/20/20 1320        Subjective: Patient was seen and examined at bedside.  Overnight events noted.  Patient is very lethargic since morning, opens eyes but nonresponsive.  She is on 8 L of oxygen sats 94%. TPN discontinued. She is passing flatus and having bowel movement.  Objective: Vitals:   12/25/20 0900 12/25/20 0929 12/25/20 1037 12/25/20 1153  BP: (!) 112/58 (!) 103/57 (!) 106/57 (!) 95/55  Pulse: (!) 105 (!) 101 96 99  Resp: (!) 32 (!) 40 (!) 38 (!) 44  Temp: 97.9 F (36.6 C) 98 F (36.7 C) 97.7 F (36.5 C) (!) 97.5 F (36.4 C)  TempSrc: Oral Oral Oral Oral  SpO2: 92% 94% 93% 94%  Weight:      Height:        Intake/Output Summary (Last 24 hours) at 12/25/2020 1313 Last data filed at 12/25/2020 0900 Gross per 24 hour  Intake 549.34 ml  Output --  Net 549.34 ml   Filed Weights   12/23/20 0559 12/24/20 0405 12/25/20 0528  Weight: 41.9 kg 42 kg 43.3 kg    Examination:  General exam: Deconditioned, appears very lethargic, opens eyes but not responsive. Respiratory system: crackles to auscultation. Respiratory effort normal. RR 17 Cardiovascular system: S1 & S2 heard, RRR. No JVD, murmurs, rubs, gallops or clicks. No pedal  edema. Gastrointestinal system: Abdomen is distended, soft and nontender. No organomegaly or masses felt. Normal bowel sounds heard.   Midline surgical scar noted. Central nervous system: Lethargic, not responsive , No focal neurological deficits. Extremities: No edema, no cyanosis, no clubbing. Skin: No rashes, lesions or ulcers Psychiatry: Not assessed.    Data Reviewed: I have personally reviewed following labs and imaging studies  CBC: Recent Labs  Lab 12/19/20 0417 12/20/20 0428 12/22/20 0331 12/22/20 1600 12/23/20 0312 12/25/20 0410  WBC 7.8 8.1 9.5  --  16.4* 19.1*  NEUTROABS 6.9  --  7.8*  --  13.3*  --   HGB 9.7* 9.0* 6.5* 9.1* 8.7* 8.8*  HCT 31.7* 30.2* 22.6* 29.1* 27.8* 31.1*  MCV 95.5 98.4 100.4*  --  93.6 106.1*  PLT 151 165 241  --  238 344   Basic Metabolic Panel: Recent Labs  Lab 12/21/20 1136 12/22/20 0331 12/23/20 0312 12/24/20 0150 12/25/20 0410 12/25/20 0937  NA  --  138 139 138 139 139  K  --  4.3 4.1 4.7 6.2* 6.5*  6.5*  CL  --  100 101 101 101 104  CO2  --  34* 32 33* 34* 30  GLUCOSE  --  137* 148* 122* 337* 146*  BUN  --  41* 56* 46* 58* 62*  CREATININE  --  0.90 0.89 0.81 1.15* 1.16*  CALCIUM  --  8.1* 8.2* 8.5* 9.2 9.1  MG 1.9 2.0 1.9 2.0  --  2.3  PHOS 2.6 1.8* 2.1* 3.3  --  4.3   GFR: Estimated Creatinine Clearance: 24.2 mL/min (A) (by C-G formula based on SCr of 1.16 mg/dL (H)). Liver Function Tests: Recent Labs  Lab 12/22/20 0331 12/23/20 0312  AST 16 17  ALT 12 13  ALKPHOS 47 49  BILITOT 0.5 0.4  PROT 4.3* 4.8*  ALBUMIN 2.1* 2.4*   No results for input(s): LIPASE, AMYLASE in the last 168 hours.  No results for input(s): AMMONIA in the last 168 hours. Coagulation Profile: No results for input(s): INR, PROTIME in the last 168 hours. Cardiac Enzymes: No results for input(s): CKTOTAL, CKMB, CKMBINDEX, TROPONINI in the last 168 hours. BNP (last 3 results) No results for input(s): PROBNP in the last 8760  hours. HbA1C: No results for input(s): HGBA1C in the last 72 hours. CBG: Recent Labs  Lab 12/24/20 1642 12/25/20 0008 12/25/20 0525 12/25/20 0757 12/25/20 1150  GLUCAP 147* 299* 302* 245* 94  Lipid Profile: Recent Labs    12/23/20 0312  TRIG 107   Thyroid Function Tests: No results for input(s): TSH, T4TOTAL, FREET4, T3FREE, THYROIDAB in the last 72 hours. Anemia Panel: No results for input(s): VITAMINB12, FOLATE, FERRITIN, TIBC, IRON, RETICCTPCT in the last 72 hours. Sepsis Labs: No results for input(s): PROCALCITON, LATICACIDVEN in the last 168 hours.   No results found for this or any previous visit (from the past 240 hour(s)).   Radiology Studies: DG CHEST PORT 1 VIEW  Result Date: 12/25/2020 CLINICAL DATA:  Shortness of breath, history hypertension, COPD EXAM: PORTABLE CHEST 1 VIEW COMPARISON:  Portable exam 1103 hours compared to 12/23/2020 FINDINGS: Stable heart size mediastinal contours. Atherosclerotic calcification aorta. RIGHT arm PICC line tip projects over SVC near cavoatrial junction. Bibasilar pleural effusions and atelectasis versus consolidation unchanged. Underlying emphysematous changes. No pneumothorax. Osseous demineralization with degenerative changes and scoliosis of thoracolumbar spine. IMPRESSION: Persistent BILATERAL pleural effusions and atelectasis versus consolidation at both lung bases. Underlying COPD. Aortic Atherosclerosis (ICD10-I70.0) and Emphysema (ICD10-J43.9). Electronically Signed   By: Ulyses Southward M.D.   On: 12/25/2020 11:33     Scheduled Meds:  sodium chloride   Intravenous Once   bisacodyl  10 mg Rectal Daily   brimonidine  1 drop Both Eyes BID   Chlorhexidine Gluconate Cloth  6 each Topical Daily   dorzolamide  1 drop Both Eyes BID   enoxaparin (LOVENOX) injection  30 mg Subcutaneous Q24H   feeding supplement  1 Container Oral BID BM   feeding supplement  237 mL Oral Q24H   hydrogen peroxide       insulin aspart  0-9 Units  Subcutaneous Q6H   lip balm  1 application Topical BID   midodrine  5 mg Oral TID WC   Continuous Infusions:  sodium chloride 250 mL (12/21/20 1543)   dextrose 5 % and 0.9% NaCl 60 mL/hr at 12/25/20 1035   ondansetron (ZOFRAN) IV     piperacillin-tazobactam (ZOSYN)  IV 3.375 g (12/25/20 1219)   TPN ADULT (ION)       LOS: 10 days    Time spent: 35 mins    Jaquelyne Firkus, MD [facility]   If 7PM-7AM, please contact night-coverage

## 2020-12-26 LAB — BPAM RBC
Blood Product Expiration Date: 202208032359
Blood Product Expiration Date: 202208042359
ISSUE DATE / TIME: 202207030917
Unit Type and Rh: 5100
Unit Type and Rh: 5100

## 2020-12-26 LAB — MAGNESIUM: Magnesium: 2.7 mg/dL — ABNORMAL HIGH (ref 1.7–2.4)

## 2020-12-26 LAB — TYPE AND SCREEN
ABO/RH(D): O POS
Antibody Screen: NEGATIVE
Unit division: 0
Unit division: 0

## 2020-12-26 LAB — COMPREHENSIVE METABOLIC PANEL
ALT: 14 U/L (ref 0–44)
AST: 18 U/L (ref 15–41)
Albumin: 2.6 g/dL — ABNORMAL LOW (ref 3.5–5.0)
Alkaline Phosphatase: 57 U/L (ref 38–126)
Anion gap: 1 — ABNORMAL LOW (ref 5–15)
BUN: 84 mg/dL — ABNORMAL HIGH (ref 8–23)
CO2: 33 mmol/L — ABNORMAL HIGH (ref 22–32)
Calcium: 8.8 mg/dL — ABNORMAL LOW (ref 8.9–10.3)
Chloride: 105 mmol/L (ref 98–111)
Creatinine, Ser: 2.15 mg/dL — ABNORMAL HIGH (ref 0.44–1.00)
GFR, Estimated: 22 mL/min — ABNORMAL LOW (ref >60)
Glucose, Bld: 164 mg/dL — ABNORMAL HIGH (ref 70–99)
Potassium: 7.2 mmol/L (ref 3.5–5.1)
Sodium: 139 mmol/L (ref 135–145)
Total Bilirubin: 0.3 mg/dL (ref 0.3–1.2)
Total Protein: 5.5 g/dL — ABNORMAL LOW (ref 6.5–8.1)

## 2020-12-26 LAB — PHOSPHORUS: Phosphorus: 5.5 mg/dL — ABNORMAL HIGH (ref 2.5–4.6)

## 2020-12-26 LAB — GLUCOSE, CAPILLARY
Glucose-Capillary: 141 mg/dL — ABNORMAL HIGH (ref 70–99)
Glucose-Capillary: 179 mg/dL — ABNORMAL HIGH (ref 70–99)

## 2020-12-26 MED ORDER — SODIUM ZIRCONIUM CYCLOSILICATE 10 G PO PACK: 10.0000 g | PACK | Freq: Once | ORAL | Status: DC

## 2020-12-31 ENCOUNTER — Telehealth: Payer: Self-pay | Admitting: Internal Medicine

## 2020-12-31 NOTE — Telephone Encounter (Signed)
Pt's daughter Amber Rowland is asking for the Social Security Number for her mom Amber Rowland just deceased on the Jan 25, 2021. Pt states the [employer] mentioned she needed to contact the Provider to obtain this. Please advise and thank you

## 2020-12-31 NOTE — Telephone Encounter (Signed)
Reached out to DJ to see if we are able to provide SSN to pt daughter. We have sent an email out and is waiting for a response

## 2021-01-08 ENCOUNTER — Ambulatory Visit: Payer: Medicare Other | Admitting: Physician Assistant

## 2021-01-20 NOTE — Care Management Important Message (Signed)
Important Message  Patient Details IM Letter given to the Patient. Name: Amber Rowland MRN: [id number] Date of Birth: 09/01/1933   Medicare Important Message Given:  Yes     Caren Macadam 12/24/2020, 10:15 AM

## 2021-01-20 NOTE — Death Summary Note (Signed)
Death Summary  Amber Rowland IRS:[id number] DOB: Jan 01, 1934 DOA: January 04, 2021  PCP: Marcine Matar, MD  Admit date: 04-Jan-2021  Date of Death: January 15, 2021  Time of Death: 06:40 am  Notification: Marcine Matar, MD notified of death of 2021/01/15   History of present illness:  Amber Rowland is a 87 y.o. female with a history of small bowel obstruction requiring lysis of adhesions in 2013, malnutrition, hypertension, CKD stage III who presented to the emergency department with complaints of severe abdominal pain, distention, nausea/vomiting.  CT abdomen showed high-grade small bowel obstruction with transition point at the right lower quadrant.  Patient was admitted for SBO, NG tube inserted.  General surgery consulted . Patient underwent exploratory laparotomy on 01-05-23.  Hospital course remarkable for lethargy after receiving a dose of Dilaudid, improvement with Narcan.  She was also hypotensive on 12/19/2020 and was moved to progressive unit.  She was started on midodrine and blood pressure has improved.  She has developed pleural effusion and underwent thoracocentesis once.  Patient was having intermittent lethargy. She had poor p.o. intake, TPN was started. Patient has developed electrolyte abnormalities.  TPN was discontinued. 12/25/20: Patient was very lethargic in the morning, unable to arouse with sternal rub. She opens eyes but not responsive.  PCCM consulted, repeat chest x-ray shows recurrent pleural effusion.  Patient was not candidate for invasive intervention.  Palliative care consulted to discuss goals of care.  Patient has poor prognosis.  Patient continued to decline, became hypotensive and hypothermic.  Patient passed away on Jan 15, 2021 @ 6:40 am. Family was notified.  Final Diagnoses:  1.   Acute on chronic hypoxic respiratory failure. 2.   Aspiration pneumonia 3.   Metabolic encephalopathy 4.   Small bowel obstruction.   The results of significant diagnostics from this  hospitalization (including imaging, microbiology, ancillary and laboratory) are listed below for reference.    Significant Diagnostic Studies: DG Chest 1 View  Result Date: 12/20/2020 CLINICAL DATA:  Post RIGHT thoracentesis with removal of 400 mL fluid EXAM: CHEST  1 VIEW COMPARISON:  CT 12/20/2020, radiograph 12/19/2020 FINDINGS: Interval decrease in the amount of gradient opacity in the right lung base with small residual layering effusion tracking into the fissure. Small apical pneumothorax. Overall appearance of a left pleural effusion is grossly similar to comparison radiography. Some additional areas of scarring and architectural distortion noted towards the bilateral lung apices, right greater than left. Portions of the cardiac silhouette are obscured by overlying opacity. Visible portions are unchanged prior. Chest wall deformity compatible with pectus excavatum seen on comparison. No acute abnormality of the chest wall. Telemetry leads overlie the chest. IMPRESSION: Post right thoracentesis with decrease in the amount of failing gradient opacity in the right lung base compatible with a diminished right pleural effusion. Small layering effusion remains with adjacent passive atelectasis. Small right apical pneumothorax. Stable appearance of a left pleural effusion.  No left pneumothorax. No other significant interval changes. These results will be called to the ordering clinician or representative by the Radiologist Assistant, and communication documented in the PACS or [employer]. Electronically Signed   By: Kreg Shropshire M.D.   On: 12/20/2020 17:22   DG Abdomen 1 View  Result Date: 01/04/21 CLINICAL DATA:  Bowel obstruction EXAM: ABDOMEN - 1 VIEW COMPARISON:  Contemporaneous abdominal CT FINDINGS: The enteric tube tip is at the distal stomach by CT. High-grade small bowel obstruction. Right nephrogram from prior CT. The left kidney is severely atrophic by CT. IMPRESSION: The  enteric tube tip  is at the distal stomach. Electronically Signed   By: Marnee Spring M.D.   On: 12/19/2020 04:48   CT HEAD WO CONTRAST  Result Date: 12/25/2020 CLINICAL DATA:  Altered mental status. EXAM: CT HEAD WITHOUT CONTRAST TECHNIQUE: Contiguous axial images were obtained from the base of the skull through the vertex without intravenous contrast. COMPARISON:  12/18/2020 FINDINGS: Brain: There is no evidence of an acute infarct, intracranial hemorrhage, mass, midline shift, or extra-axial fluid collection. Confluent hypodensities in the cerebral white matter bilaterally are unchanged and nonspecific but compatible with severe chronic small vessel ischemic disease. There is likely a chronic lacunar infarct involving the lateral aspect of the left caudate head/anterior limb of internal capsule. There is mild cerebral atrophy. Vascular: Calcified atherosclerosis at the skull base. No hyperdense vessel. Skull: No fracture or suspicious osseous lesion. Sinuses/Orbits: Unchanged opacification of posterior right ethmoid air cells. Small bilateral mastoid effusions. Bilateral cataract extraction. Other: None. IMPRESSION: 1. No evidence of acute intracranial abnormality. 2. Severe chronic small vessel ischemic disease. Electronically Signed   By: Sebastian Ache M.D.   On: 12/25/2020 15:26   CT HEAD WO CONTRAST  Result Date: 12/18/2020 CLINICAL DATA:  Delirium EXAM: CT HEAD WITHOUT CONTRAST TECHNIQUE: Contiguous axial images were obtained from the base of the skull through the vertex without intravenous contrast. COMPARISON:  None. FINDINGS: Brain: There is no mass, hemorrhage or extra-axial collection. The size and configuration of the ventricles and extra-axial CSF spaces are normal. There is hypoattenuation of the white matter, most commonly indicating chronic small vessel disease. Vascular: No abnormal hyperdensity of the major intracranial arteries or dural venous sinuses. No intracranial atherosclerosis. Skull: The  visualized skull base, calvarium and extracranial soft tissues are normal. Sinuses/Orbits: No fluid levels or advanced mucosal thickening of the visualized paranasal sinuses. No mastoid or middle ear effusion. The orbits are normal. IMPRESSION: Chronic small vessel disease without acute intracranial abnormality. Electronically Signed   By: Deatra Robinson M.D.   On: 12/18/2020 22:16   CT CHEST W CONTRAST  Result Date: 12/20/2020 CLINICAL DATA:  Respiratory failure EXAM: CT CHEST WITH CONTRAST TECHNIQUE: Multidetector CT imaging of the chest was performed during intravenous contrast administration. CONTRAST:  38mL OMNIPAQUE IOHEXOL 300 MG/ML  SOLN COMPARISON:  Chest radiograph 12/19/2020 and CT a chest 12/29/2014 FINDINGS: Cardiovascular: Atherosclerotic calcification of the thoracic aorta and branch vessels. Mild cardiomegaly. Mediastinum/Nodes: No overtly pathologic adenopathy is identified. Dilated upper thoracic esophagus with air-fluid level, the lower thoracic esophagus is somewhat displaced to the left possibly as result of the pectus excavatum. Lungs/Pleura: Large bilateral pleural effusions (nonspecific for transudative versus exudative etiology with passive atelectasis including complete atelectasis of both lower lobes. Biapical pleuroparenchymal scarring. Upper Abdomen: Ascites. Musculoskeletal: Pectus excavatum. Severe thoracic kyphosis. Substantial levoconvex upper thoracic and dextroconvex lower thoracic scoliosis. Marked paucity of adipose tissue. IMPRESSION: 1. Large bilateral pleural effusions with associated passive atelectasis. 2. Ascites. 3. Thoracic kyphosis with substantial thoracic scoliosis. 4. Pectus excavatum. 5.  Aortic Atherosclerosis (ICD10-I70.0).  Mild cardiomegaly. 6. Dilated upper thoracic esophagus with air-fluid level, and leftward deviation of the lower thoracic esophagus possibly partially related to the pectus excavatum. Electronically Signed   By: Gaylyn Rong M.D.   On:  12/20/2020 15:27   CT ABDOMEN PELVIS W CONTRAST  Result Date: 12/19/2020 CLINICAL DATA:  Abdominal distension with bowel obstruction suspected EXAM: CT ABDOMEN AND PELVIS WITH CONTRAST TECHNIQUE: Multidetector CT imaging of the abdomen and pelvis was performed using the standard protocol following  bolus administration of intravenous contrast. CONTRAST:  80mL OMNIPAQUE IOHEXOL 300 MG/ML  SOLN COMPARISON:  12/23/2017 FINDINGS: Lower chest: Marked pectus excavatum. Generous heart size. Extensive atheromatous calcification of the aorta. Pulmonary nodule in the right lower lobe on 2:2, stable from 2016 and benign. Hepatobiliary: No focal liver abnormality.No evidence of biliary obstruction or stone. Pancreas: Unremarkable. Spleen: Unremarkable. Adrenals/Urinary Tract: Negative adrenals. Severe left renal atrophy, nonfunctional appearing. Left renal calculus measuring 13 mm. Punctate renal calculus on the right where there is also simple cystic densities. Right renal cortical lobulation from scarring. Unremarkable bladder. Stomach/Bowel: Dilated and gas/fluid-filled small bowel with transition point in the right lower abdomen where there is marked distortion and narrowing of bowel loops that are twisted upon themselves, as marked on series 2. Affected loops are thickened from submucosal edema; noted elevated lactate. No perforation or focal area of non enhancement. There is an enteric tube with tip at the distal stomach. Vascular/Lymphatic: Mechanical appearing SMA branch distortion at the level of transition point. Diffuse atheromatous calcification of the aorta and branch vessels. No mass or adenopathy. Reproductive:No pathologic findings. Other: Low-density ascites considered reactive. No noted pneumoperitoneum Musculoskeletal: Scoliosis, osteopenia, and generalized spinal degeneration. IMPRESSION: 1. High-grade small bowel obstruction with right lower quadrant transition point where there is severe bowel loop  distortion and narrowing. Bowel loops are notably edematous at the level of obstruction. 2. Notable degree of ascites for a bowel obstruction, also indicative of the severity. 3. Severe left renal atrophy with nonfunctional appearance. Electronically Signed   By: Marnee Spring M.D.   On: 12/18/2020 04:47   DG CHEST PORT 1 VIEW  Result Date: 12/25/2020 CLINICAL DATA:  Shortness of breath, history hypertension, COPD EXAM: PORTABLE CHEST 1 VIEW COMPARISON:  Portable exam 1103 hours compared to 12/23/2020 FINDINGS: Stable heart size mediastinal contours. Atherosclerotic calcification aorta. RIGHT arm PICC line tip projects over SVC near cavoatrial junction. Bibasilar pleural effusions and atelectasis versus consolidation unchanged. Underlying emphysematous changes. No pneumothorax. Osseous demineralization with degenerative changes and scoliosis of thoracolumbar spine. IMPRESSION: Persistent BILATERAL pleural effusions and atelectasis versus consolidation at both lung bases. Underlying COPD. Aortic Atherosclerosis (ICD10-I70.0) and Emphysema (ICD10-J43.9). Electronically Signed   By: Ulyses Southward M.D.   On: 12/25/2020 11:33   DG CHEST PORT 1 VIEW  Result Date: 12/23/2020 CLINICAL DATA:  History of pneumonia and abdominal pain.  Nonverbal. EXAM: PORTABLE CHEST 1 VIEW COMPARISON:  12/20/2020 FINDINGS: The heart is enlarged, partially obscured by a bilateral pulmonary opacities. There has been reaccumulation of veiling density in the RIGHT lung consistent with increased pleural effusion. Interval placement of RIGHT-sided PICC line, tip overlying the superior vena cava. No definite pneumothorax identified. Patient is rotated towards the RIGHT, partially obscuring the RIGHT lung apex. Density in the LEFT lung is consistent atelectasis or infiltrate and pleural effusion and appear stable. IMPRESSION: 1. Interval placement of RIGHT-sided PICC line. 2. Increased RIGHT pleural effusion and RIGHT basilar opacity.  Electronically Signed   By: Norva Pavlov M.D.   On: 12/23/2020 11:56   DG CHEST PORT 1 VIEW  Result Date: 12/19/2020 CLINICAL DATA:  Shortness of breath. EXAM: PORTABLE CHEST 1 VIEW COMPARISON:  March 17, 2020. FINDINGS: Stable cardiomegaly. No pneumothorax is noted. Increased bilateral perihilar and basilar opacities are noted concerning for worsening edema or atelectasis with associated pleural effusions. Bony thorax is unremarkable. IMPRESSION: Increased bilateral perihilar and basilar opacities are noted concerning for worsening edema or atelectasis with associated pleural effusions. Electronically Signed   By: Fayrene Fearing  Christen Butter M.D.   On: 12/19/2020 13:46   DG ABD ACUTE 2+V W 1V CHEST  Result Date: 12/16/2020 CLINICAL DATA:  Abdominal distension, nausea, vomiting EXAM: DG ABDOMEN ACUTE WITH 1 VIEW CHEST COMPARISON:  None. FINDINGS: Lungs appear mildly hyperinflated. The lungs are clear. No pneumothorax or pleural effusion. Mild to moderate cardiomegaly is present. Pulmonary vascularity is normal. There is marked gas disc distension of a single loop of large bowel which appears displaced into the left upper quadrant suspicious for changes of underlying cecal volvulus. There is dilated loops of small bowel noted within the mid pelvis and left lower quadrant indicative of a a possible secondary small bowel obstruction. No free intraperitoneal gas. No acute bone abnormality. IMPRESSION: Possible cecal volvulus with secondary small bowel obstruction. Dedicated contrast enhanced CT examination of the abdomen and pelvis is recommended for further evaluation. Electronically Signed   By: Helyn Numbers MD   On: 12/09/2020 02:20   DG Abd Portable 1V  Result Date: 12/20/2020 CLINICAL DATA:  NG tube placement EXAM: PORTABLE ABDOMEN - 1 VIEW COMPARISON:  12/20/2020, CT 12/14/2020 FINDINGS: Esophageal tube tip has been placed, somewhat low lying tip at the right lower quadrant/pelvis. Residual air distension  of central small bowel. Contrast in the bladder. IMPRESSION: 1. Esophageal tube tip somewhat low lying in appearance and projects at the level of right pelvis. Tip could potentially be within second portion of duodenum 2. Air distension of central small bowel consistent with history of bowel obstruction Electronically Signed   By: Jasmine Pang M.D.   On: 12/20/2020 21:42   DG Abd Portable 1V  Result Date: 12/20/2020 CLINICAL DATA:  Abdominal pain and distension with bloating EXAM: PORTABLE ABDOMEN - 1 VIEW COMPARISON:  Multiple exams, including 12/08/2020 FINDINGS: The nasogastric tube is no longer visualized. There is substantial gaseous distention of the stomach. This displaces loops of bowel in the abdomen. Stable lines noted in the right lower quadrant. Aortoiliac atherosclerotic vascular disease. Bony demineralization. Levoconvex lumbar scoliosis with rotary component. There is also leftward rotation of the patient on today's exam. Left upper quadrant calcification corresponding to known left renal calculus. IMPRESSION: 1. Gaseous distention of the stomach. 2. Stable appearance of left renal calculus. 3.  Aortic Atherosclerosis (ICD10-I70.0). 4. Levoconvex lumbar scoliosis with rotary component. 5. Bony demineralization. Electronically Signed   By: Gaylyn Rong M.D.   On: 12/20/2020 15:19   [place] EKG SITE RITE  Result Date: 12/21/2020 If Site Rite image not attached, placement could not be confirmed due to current cardiac rhythm.  US THORACENTESIS ASP PLEURAL SPACE W/IMG GUIDE  Result Date: 12/20/2020 INDICATION: Shortness of breath. Hypoxia. Bilateral pleural effusion, right greater than left. Request for therapeutic thoracentesis. EXAM: ULTRASOUND GUIDED RIGHT THORACENTESIS MEDICATIONS: 1% plain lidocaine, 2 mL COMPLICATIONS: SIR Level A - No therapy, no consequence. Postprocedural chest radiograph demonstrates development of a tiny right apical presumably ex vacuo pneumothorax. PROCEDURE: An  ultrasound guided thoracentesis was thoroughly discussed with the patient and questions answered. The benefits, risks, alternatives and complications were also discussed. The patient understands and wishes to proceed with the procedure. Written consent was obtained. Ultrasound was performed to localize and mark an adequate pocket of fluid in the right chest. The area was then prepped and draped in the normal sterile fashion. 1% Lidocaine was used for local anesthesia. Under ultrasound guidance a 6 Fr Safe-T-Centesis catheter was introduced. Thoracentesis was performed. The catheter was removed and a dressing applied. FINDINGS: A total of approximately 400 mL of  clear yellow fluid was removed. IMPRESSION: Successful ultrasound guided right thoracentesis yielding 400 mL of pleural fluid complicated by development of a tiny right apical presumably ex vacuo pneumothorax. Read by: Brayton ElKevin Bruning PA-C Electronically Signed   By: Simonne ComeJohn  Watts M.D.   On: 12/20/2020 17:25    Microbiology: No results found for this or any previous visit (from the past 240 hour(s)).   Labs: Basic Metabolic Panel: Recent Labs  Lab 12/22/20 0331 12/23/20 0312 12/24/20 0150 12/25/20 0410 12/25/20 0937 01/12/2021 0340  NA 138 139 138 139 139 139  K 4.3 4.1 4.7 6.2* 6.5*  6.5* 7.2*  CL 100 101 101 101 104 105  CO2 34* 32 33* 34* 30 33*  GLUCOSE 137* 148* 122* 337* 146* 164*  BUN 41* 56* 46* 58* 62* 84*  CREATININE 0.90 0.89 0.81 1.15* 1.16* 2.15*  CALCIUM 8.1* 8.2* 8.5* 9.2 9.1 8.8*  MG 2.0 1.9 2.0  --  2.3 2.7*  PHOS 1.8* 2.1* 3.3  --  4.3 5.5*   Liver Function Tests: Recent Labs  Lab 12/22/20 0331 12/23/20 0312 01/10/2021 0340  AST 16 17 18   ALT 12 13 14   ALKPHOS 47 49 57  BILITOT 0.5 0.4 0.3  PROT 4.3* 4.8* 5.5*  ALBUMIN 2.1* 2.4* 2.6*   No results for input(s): LIPASE, AMYLASE in the last 168 hours. No results for input(s): AMMONIA in the last 168 hours. CBC: Recent Labs  Lab 12/20/20 0428 12/22/20 0331  12/22/20 1600 12/23/20 0312 12/25/20 0410  WBC 8.1 9.5  --  16.4* 19.1*  NEUTROABS  --  7.8*  --  13.3*  --   HGB 9.0* 6.5* 9.1* 8.7* 8.8*  HCT 30.2* 22.6* 29.1* 27.8* 31.1*  MCV 98.4 100.4*  --  93.6 106.1*  PLT 165 241  --  238 344   Cardiac Enzymes: No results for input(s): CKTOTAL, CKMB, CKMBINDEX, TROPONINI in the last 168 hours. D-Dimer No results for input(s): DDIMER in the last 72 hours. BNP: Invalid input(s): POCBNP CBG: Recent Labs  Lab 12/25/20 0757 12/25/20 1150 12/25/20 1624 12/25/20 2358 12/31/2020 0533  GLUCAP 245* 94 146* 179* 141*   Anemia work up No results for input(s): VITAMINB12, FOLATE, FERRITIN, TIBC, IRON, RETICCTPCT in the last 72 hours. Urinalysis    Component Value Date/Time   COLORURINE AMBER (A) 12/17/2020 1026   APPEARANCEUR CLOUDY (A) 12/17/2020 1026   LABSPEC 1.023 12/17/2020 1026   PHURINE 5.0 12/17/2020 1026   GLUCOSEU NEGATIVE 12/17/2020 1026   HGBUR SMALL (A) 12/17/2020 1026   BILIRUBINUR NEGATIVE 12/17/2020 1026   KETONESUR NEGATIVE 12/17/2020 1026   PROTEINUR 30 (A) 12/17/2020 1026   UROBILINOGEN 0.2 11/03/2018 1454   NITRITE NEGATIVE 12/17/2020 1026   LEUKOCYTESUR TRACE (A) 12/17/2020 1026   Sepsis Labs Invalid input(s): PROCALCITONIN,  WBC,  LACTICIDVEN   SIGNED:  Cipriano BunkerPARDEEP Donyae Kohn, MD  [facility] 01/19/2021, 10:28 AM  If 7PM-7AM, please contact night-coverage [email or web address]

## 2021-01-20 NOTE — Progress Notes (Signed)
Patient passed this am at 0640. Daughter Amber Rowland made aware.  Amber Rowland is in route to come to bedside. Funeral home information will be provided once family arrives.

## 2021-01-20 NOTE — Progress Notes (Signed)
BRIEF OVERNIGHT PROGRESS REPORT  Notified by RN that patient has expired at 0640 Patient was DNR and had Palliative Care. 2 RN verified. Family was not immediately available to RN in person and they have been called.   Chinita Greenland MSNA ACNPC-AG Acute Care Nurse Practitioner [facility]

## 2021-01-20 NOTE — Progress Notes (Signed)
Brief Nutrition Note  Patient passed away this morning at 6:40 AM.   RD to discontinue calorie count.  RD to sign off from care.  Vertell Limber, RD, LDN (she/her/hers) Registered Dietitian I After-Hours/Weekend Pager # in [place]

## 2021-01-20 NOTE — Progress Notes (Signed)
Chaplain engaged in an initial visit with [name]'s two children and son-in-law.  Chaplain was able to learn about Amber Rowland who children voiced was loving, determined, stubborn and affectionate.  They detailed that [name] was very strong-willed and how she exuded that while they were growing up.  They voiced that they are not close to any other family and that they've had each other primarily.  Chaplain offered support, listening and prayer with family.  Chaplain also asked for comfort cart to be delivered and provided water and refreshments for them.  Chaplain is available to follow-up as needed.    01/01/2021 1100  Clinical Encounter Type  Visited With Patient and family together  Visit Type Death;Spiritual support  Spiritual Encounters  Spiritual Needs Prayer;Grief support

## 2021-01-20 DEATH — deceased

## 2021-03-19 ENCOUNTER — Ambulatory Visit: Payer: Medicare Other | Admitting: Podiatry
